# Patient Record
Sex: Female | Born: 1965
Health system: Southern US, Community
[De-identification: ages and names within clinical notes are randomized; demographics above are authoritative.]

## PROBLEM LIST (undated history)

## (undated) DIAGNOSIS — IMO0002 Reserved for concepts with insufficient information to code with codable children: Secondary | ICD-10-CM

## (undated) DIAGNOSIS — F4310 Post-traumatic stress disorder, unspecified: Secondary | ICD-10-CM

## (undated) DIAGNOSIS — N289 Disorder of kidney and ureter, unspecified: Secondary | ICD-10-CM

## (undated) DIAGNOSIS — I639 Cerebral infarction, unspecified: Secondary | ICD-10-CM

## (undated) DIAGNOSIS — J189 Pneumonia, unspecified organism: Secondary | ICD-10-CM

## (undated) DIAGNOSIS — F32A Depression, unspecified: Secondary | ICD-10-CM

## (undated) DIAGNOSIS — D649 Anemia, unspecified: Secondary | ICD-10-CM

## (undated) DIAGNOSIS — Z9289 Personal history of other medical treatment: Secondary | ICD-10-CM

## (undated) DIAGNOSIS — N183 Chronic kidney disease, stage 3 unspecified: Secondary | ICD-10-CM

## (undated) DIAGNOSIS — I10 Essential (primary) hypertension: Secondary | ICD-10-CM

## (undated) DIAGNOSIS — I2699 Other pulmonary embolism without acute cor pulmonale: Secondary | ICD-10-CM

## (undated) DIAGNOSIS — M069 Rheumatoid arthritis, unspecified: Secondary | ICD-10-CM

## (undated) DIAGNOSIS — F419 Anxiety disorder, unspecified: Secondary | ICD-10-CM

## (undated) DIAGNOSIS — R569 Unspecified convulsions: Secondary | ICD-10-CM

## (undated) DIAGNOSIS — G459 Transient cerebral ischemic attack, unspecified: Secondary | ICD-10-CM

## (undated) DIAGNOSIS — E785 Hyperlipidemia, unspecified: Secondary | ICD-10-CM

## (undated) DIAGNOSIS — M1990 Unspecified osteoarthritis, unspecified site: Secondary | ICD-10-CM

## (undated) DIAGNOSIS — M329 Systemic lupus erythematosus, unspecified: Secondary | ICD-10-CM

## (undated) DIAGNOSIS — F329 Major depressive disorder, single episode, unspecified: Secondary | ICD-10-CM

## (undated) HISTORY — PX: ABDOMINAL SURGERY: SHX537

## (undated) HISTORY — DX: Post-traumatic stress disorder, unspecified: F43.10

## (undated) HISTORY — DX: Major depressive disorder, single episode, unspecified: F32.9

## (undated) HISTORY — PX: OTHER SURGICAL HISTORY: SHX169

## (undated) HISTORY — DX: Anxiety disorder, unspecified: F41.9

## (undated) HISTORY — PX: CYST EXCISION: SHX5701

## (undated) HISTORY — DX: Depression, unspecified: F32.A

## (undated) HISTORY — PX: ABLATION: SHX5711

---

## 2015-12-07 ENCOUNTER — Encounter (HOSPITAL_COMMUNITY): Payer: Self-pay

## 2015-12-07 ENCOUNTER — Emergency Department (HOSPITAL_COMMUNITY): Payer: BLUE CROSS/BLUE SHIELD

## 2015-12-07 ENCOUNTER — Emergency Department (HOSPITAL_COMMUNITY)
Admission: EM | Admit: 2015-12-07 | Discharge: 2015-12-07 | Disposition: A | Discharge status: Home / Self Care | Payer: BLUE CROSS/BLUE SHIELD | Attending: Emergency Medicine | Admitting: Emergency Medicine

## 2015-12-07 DIAGNOSIS — M545 Low back pain: Secondary | ICD-10-CM | POA: Diagnosis present

## 2015-12-07 DIAGNOSIS — Z79899 Other long term (current) drug therapy: Secondary | ICD-10-CM | POA: Insufficient documentation

## 2015-12-07 DIAGNOSIS — Z86711 Personal history of pulmonary embolism: Secondary | ICD-10-CM | POA: Diagnosis not present

## 2015-12-07 DIAGNOSIS — R0602 Shortness of breath: Secondary | ICD-10-CM | POA: Insufficient documentation

## 2015-12-07 DIAGNOSIS — R011 Cardiac murmur, unspecified: Secondary | ICD-10-CM | POA: Insufficient documentation

## 2015-12-07 DIAGNOSIS — Z3202 Encounter for pregnancy test, result negative: Secondary | ICD-10-CM | POA: Insufficient documentation

## 2015-12-07 DIAGNOSIS — Z8673 Personal history of transient ischemic attack (TIA), and cerebral infarction without residual deficits: Secondary | ICD-10-CM | POA: Insufficient documentation

## 2015-12-07 HISTORY — DX: Other pulmonary embolism without acute cor pulmonale: I26.99

## 2015-12-07 HISTORY — DX: Systemic lupus erythematosus, unspecified: M32.9

## 2015-12-07 HISTORY — DX: Reserved for concepts with insufficient information to code with codable children: IMO0002

## 2015-12-07 HISTORY — DX: Transient cerebral ischemic attack, unspecified: G45.9

## 2015-12-07 LAB — URINALYSIS, ROUTINE W REFLEX MICROSCOPIC
BILIRUBIN URINE: NEGATIVE
Glucose, UA: NEGATIVE mg/dL
Hgb urine dipstick: NEGATIVE
KETONES UR: NEGATIVE mg/dL
LEUKOCYTES UA: NEGATIVE
NITRITE: NEGATIVE
PH: 7.5 (ref 5.0–8.0)
PROTEIN: NEGATIVE mg/dL
Specific Gravity, Urine: 1.012 (ref 1.005–1.030)

## 2015-12-07 LAB — COMPREHENSIVE METABOLIC PANEL
ALBUMIN: 3.9 g/dL (ref 3.5–5.0)
ALK PHOS: 85 U/L (ref 38–126)
ALT: 25 U/L (ref 14–54)
ANION GAP: 14 (ref 5–15)
AST: 30 U/L (ref 15–41)
BUN: 17 mg/dL (ref 6–20)
CALCIUM: 9.5 mg/dL (ref 8.9–10.3)
CHLORIDE: 97 mmol/L — AB (ref 101–111)
CO2: 26 mmol/L (ref 22–32)
Creatinine, Ser: 1.16 mg/dL — ABNORMAL HIGH (ref 0.44–1.00)
GFR calc non Af Amer: 55 mL/min — ABNORMAL LOW (ref >60)
GLUCOSE: 93 mg/dL (ref 65–99)
Potassium: 3.4 mmol/L — ABNORMAL LOW (ref 3.5–5.1)
SODIUM: 137 mmol/L (ref 135–145)
Total Bilirubin: 0.5 mg/dL (ref 0.3–1.2)
Total Protein: 7.6 g/dL (ref 6.5–8.1)

## 2015-12-07 LAB — CBC WITH DIFFERENTIAL/PLATELET
BASOS PCT: 1 %
Basophils Absolute: 0.1 10*3/uL (ref 0.0–0.1)
EOS ABS: 0.2 10*3/uL (ref 0.0–0.7)
EOS PCT: 2 %
HCT: 38.6 % (ref 36.0–46.0)
HEMOGLOBIN: 12.4 g/dL (ref 12.0–15.0)
Lymphocytes Relative: 22 %
Lymphs Abs: 2.3 10*3/uL (ref 0.7–4.0)
MCH: 24.9 pg — AB (ref 26.0–34.0)
MCHC: 32.1 g/dL (ref 30.0–36.0)
MCV: 77.7 fL — ABNORMAL LOW (ref 78.0–100.0)
MONOS PCT: 8 %
Monocytes Absolute: 0.8 10*3/uL (ref 0.1–1.0)
NEUTROS PCT: 67 %
Neutro Abs: 7 10*3/uL (ref 1.7–7.7)
PLATELETS: 428 10*3/uL — AB (ref 150–400)
RBC: 4.97 MIL/uL (ref 3.87–5.11)
RDW: 15.5 % (ref 11.5–15.5)
WBC: 10.5 10*3/uL (ref 4.0–10.5)

## 2015-12-07 LAB — I-STAT BETA HCG BLOOD, ED (MC, WL, AP ONLY)

## 2015-12-07 LAB — I-STAT TROPONIN, ED: Troponin i, poc: 0 ng/mL (ref 0.00–0.08)

## 2015-12-07 MED ORDER — TECHNETIUM TC 99M DIETHYLENETRIAME-PENTAACETIC ACID: 30.0000 | Freq: Once | INTRAVENOUS | Status: DC | PRN

## 2015-12-07 MED ORDER — SODIUM CHLORIDE 0.9 % IV BOLUS (SEPSIS)
1000.0000 mL | Freq: Once | INTRAVENOUS | Status: AC
Start: 2015-12-07 — End: 2015-12-07
  Administered 2015-12-07: 1000 mL via INTRAVENOUS

## 2015-12-07 MED ORDER — TECHNETIUM TO 99M ALBUMIN AGGREGATED
4.0000 | Freq: Once | INTRAVENOUS | Status: AC | PRN
  Administered 2015-12-07: 4 via INTRAVENOUS

## 2015-12-07 NOTE — ED Notes (Signed)
Family at bedside. 

## 2015-12-07 NOTE — Discharge Instructions (Signed)
Talk to your doctor about blood thinners. The guidelines are that you should be on chronic blood thinners.   See your doctor.   Return to ER if you have worse chest pain, shortness of breath, flank pain.

## 2015-12-07 NOTE — ED Provider Notes (Addendum)
CSN: [id number]     Arrival date & time 12/07/15  0920 History   First MD Initiated Contact with Patient 12/07/15 [phone]     Chief Complaint  Patient presents with  . Back Pain     (Consider location/radiation/quality/duration/timing/severity/associated sxs/prior Treatment) The history is provided by the patient.  Lauren Chung is a 50 y.o. female hx of TIA, recurrent PE (12 previously, took herself off coumadin 5 months ago), recently travel from [place] here presenting with shortness of breath, left-sided chest pain. Patient states that for the last several months, she has intermittent left sided rib pain and some associated shortness of breath as well. She just drove down from [place] about 4 months ago and has been back and forth from [place]. Denies any leg swelling. She primary care doctor at [facility] 2 days ago and told her about her symptoms and a d-dimer was ordered and was positive this morning. She is sent here for possible PE. She states that she had bad reactions to IV contrast and can't even get pre medicated for it.    Past Medical History  Diagnosis Date  . PE (pulmonary embolism)   . Lupus ([place])   . TIA (transient ischemic attack)    Past Surgical History  Procedure Laterality Date  . Abdominal surgery    . Ablation     History reviewed. No pertinent family history. Social History  Substance Use Topics  . Smoking status: Never Smoker   . Smokeless tobacco: None  . Alcohol Use: None   OB History    No data available     Review of Systems  Respiratory: Positive for shortness of breath.   Musculoskeletal: Positive for back pain.  All other systems reviewed and are negative.     Allergies  Codeine; Contrast media; and Tylenol  Home Medications   Prior to Admission medications   Medication Sig Start Date End Date Taking? Authorizing Provider  alprazolam Duanne Moron) 2 MG tablet Take 2 mg by mouth daily as needed. Anxiety 10/18/15  Yes Historical Provider, MD   ibuprofen (ADVIL,MOTRIN) 800 MG tablet Take 800 mg by mouth daily as needed. pain 11/03/15  Yes Historical Provider, MD  SUMAtriptan (IMITREX) 50 MG tablet Take 50 mg by mouth every 2 (two) hours as needed for migraine. May repeat in 2 hours if headache persists or recurs.   Yes Historical Provider, MD  triamterene-hydrochlorothiazide (MAXZIDE-25) 37.5-25 MG tablet Take 1 tablet by mouth daily. 11/22/15  Yes Historical Provider, MD  zolpidem (AMBIEN CR) 12.5 MG CR tablet Take 12.5 mg by mouth at bedtime as needed. Sleep 10/18/15  Yes Historical Provider, MD   BP 112/88 mmHg  Pulse 88  Temp(Src) 98.5 F (36.9 C) (Oral)  Resp 19  Ht 5\' 8"  (1.727 m)  Wt 175 lb (79.379 kg)  BMI 26.61 kg/m2  SpO2 100%  LMP 11/08/2015 (Exact Date) Physical Exam  Constitutional: She is oriented to person, place, and time. She appears well-developed and well-nourished.  HENT:  Head: Normocephalic.  Mouth/Throat: Oropharynx is clear and moist.  Eyes: Conjunctivae are normal. Pupils are equal, round, and reactive to light.  Neck: Normal range of motion. Neck supple.  Cardiovascular: Regular rhythm and normal heart sounds.   Slightly tachycardic   Pulmonary/Chest: Effort normal and breath sounds normal. No respiratory distress. She has no wheezes. She has no rales.  Mild tenderness L lower rib, no obvious bruising   Abdominal: Soft. Bowel sounds are normal. She exhibits no distension. There  is no tenderness. There is no rebound.  Musculoskeletal: Normal range of motion. She exhibits no edema or tenderness.  No calf tenderness   Neurological: She is alert and oriented to person, place, and time.  Skin: Skin is warm and dry.  Psychiatric: She has a normal mood and affect. Her behavior is normal. Judgment and thought content normal.  Nursing note and vitals reviewed.   ED Course  Procedures (including critical care time) Labs Review Labs Reviewed  CBC WITH DIFFERENTIAL/PLATELET - Abnormal; Notable for the  following:    MCV 77.7 (*)    MCH 24.9 (*)    Platelets 428 (*)    All other components within normal limits  COMPREHENSIVE METABOLIC PANEL - Abnormal; Notable for the following:    Potassium 3.4 (*)    Chloride 97 (*)    Creatinine, Ser 1.16 (*)    GFR calc non Af Amer 55 (*)    All other components within normal limits  URINALYSIS, ROUTINE W REFLEX MICROSCOPIC (NOT AT [facility]) - Abnormal; Notable for the following:    APPearance HAZY (*)    All other components within normal limits  I-STAT TROPOININ, ED  I-STAT BETA HCG BLOOD, ED ([facility], [facility], [facility] ONLY)    Imaging Review Dg Chest 2 View  12/07/2015  CLINICAL DATA:  2-3 month history of left side back pain and shortness of breath. Initial encounter. EXAM: CHEST  2 VIEW COMPARISON:  None. FINDINGS: The lungs are clear. Heart size is normal. No pneumothorax or pleural effusion. No focal bony abnormality. IMPRESSION: No acute disease. Electronically Signed   By: Inge Rise M.D.   On: 12/07/2015 10:29   Nm Pulmonary Perf And Vent  12/07/2015  CLINICAL DATA:  Shortness of breath. EXAM: NUCLEAR MEDICINE VENTILATION - PERFUSION LUNG SCAN TECHNIQUE: Ventilation images were obtained in multiple projections using inhaled aerosol Tc-10m DTPA. Perfusion images were obtained in multiple projections after intravenous injection of Tc-45m MAA. RADIOPHARMACEUTICALS:  31.0 Technetium-11m DTPA aerosol inhalation and 4.2 Technetium-88m MAA IV COMPARISON:  Chest x-ray 12/07/2015. FINDINGS: Ventilation: No focal ventilation defect. Perfusion: No wedge shaped peripheral perfusion defects to suggest acute pulmonary embolism. IMPRESSION: Negative exam.  No evidence of pulmonary embolus. Electronically Signed   By: Marcello Moores  Register   On: 12/07/2015 13:49   I have personally reviewed and evaluated these images and lab results as part of my medical decision-making.   EKG Interpretation   Date/Time:  Friday December 07 2015 09:46:06 EST Ventricular Rate:  110 PR  Interval:  137 QRS Duration: 89 QT Interval:  348 QTC Calculation: 471 R Axis:   78 Text Interpretation:  Sinus tachycardia Probable left atrial enlargement  Low voltage, precordial leads Baseline wander in lead(s) V4 No previous  ECGs available Confirmed by Nashley Cordoba  MD, Peniel Hass ([id number]) on 12/07/2015 9:57:19 AM      MDM   Final diagnoses:  Shortness of breath    Lauren Chung is a 50 y.o. female here with shortness of breath, possible PE. Has anaphylaxis to IV contrast. Will get labs, CXR, VQ scan.    2:13 PM CXR nl. Labs unremarkable. UA showed no blood. VQ scan neg. Tachycardia resolved and vitals are stable. Didn't require any pain meds. I told her that since she has multiple PEs in the past, even though she has no PE currently, she should be on life long anticoagulation. Patient stopped coumadin 5 months ago by herself. I counseled her regarding xarelto and eliquis and other newer agents but she is  not interested currently. Told her to discuss this with her doctor.    Wandra Arthurs, MD 12/07/15 [place] Rhylynn Perdomo, MD 12/07/15 [phone]

## 2015-12-07 NOTE — ED Notes (Signed)
Patient transported to NM 

## 2015-12-07 NOTE — ED Notes (Signed)
Pt ambulated to the bathroom with ease 

## 2015-12-07 NOTE — ED Notes (Signed)
Patient transported to X-ray 

## 2015-12-07 NOTE — ED Notes (Addendum)
Pt presents with 2-3 month h/o L sided back pain and shortness of breath.  Pt reports having multiple PEs in past, has been off coumadin x 5 months. Pt seen at PCP, reports D-dimer was elevated.

## 2015-12-07 NOTE — ED Notes (Signed)
Pt in NM 

## 2015-12-24 ENCOUNTER — Other Ambulatory Visit: Payer: Self-pay | Admitting: Family

## 2015-12-24 DIAGNOSIS — Z1231 Encounter for screening mammogram for malignant neoplasm of breast: Secondary | ICD-10-CM

## 2016-07-18 ENCOUNTER — Encounter: Payer: Self-pay | Admitting: Hematology

## 2016-07-18 ENCOUNTER — Telehealth: Payer: Self-pay | Admitting: Hematology

## 2016-07-18 NOTE — Telephone Encounter (Signed)
Pt called in for appt., The referral was opened and an appt was scheduled, referring provider was contacted (lt mess) to forward recent records and made aware of appt date/time.  Mailed pt letter

## 2016-07-20 DIAGNOSIS — R402411 Glasgow coma scale score 13-15, in the field [EMT or ambulance]: Secondary | ICD-10-CM | POA: Diagnosis not present

## 2016-07-21 ENCOUNTER — Emergency Department (HOSPITAL_COMMUNITY)
Admission: EM | Admit: 2016-07-21 | Discharge: 2016-07-21 | Disposition: A | Discharge status: Home / Self Care | Payer: BLUE CROSS/BLUE SHIELD | Attending: Emergency Medicine | Admitting: Emergency Medicine

## 2016-07-21 ENCOUNTER — Encounter (HOSPITAL_COMMUNITY): Payer: Self-pay | Admitting: Emergency Medicine

## 2016-07-21 ENCOUNTER — Emergency Department (HOSPITAL_COMMUNITY): Payer: BLUE CROSS/BLUE SHIELD

## 2016-07-21 DIAGNOSIS — R410 Disorientation, unspecified: Secondary | ICD-10-CM | POA: Diagnosis not present

## 2016-07-21 DIAGNOSIS — Z79899 Other long term (current) drug therapy: Secondary | ICD-10-CM | POA: Insufficient documentation

## 2016-07-21 DIAGNOSIS — R42 Dizziness and giddiness: Secondary | ICD-10-CM | POA: Diagnosis not present

## 2016-07-21 DIAGNOSIS — R4182 Altered mental status, unspecified: Secondary | ICD-10-CM | POA: Diagnosis present

## 2016-07-21 DIAGNOSIS — Z8673 Personal history of transient ischemic attack (TIA), and cerebral infarction without residual deficits: Secondary | ICD-10-CM | POA: Diagnosis not present

## 2016-07-21 HISTORY — DX: Cerebral infarction, unspecified: I63.9

## 2016-07-21 LAB — COMPREHENSIVE METABOLIC PANEL
ALT: 16 U/L (ref 14–54)
AST: 23 U/L (ref 15–41)
Albumin: 4 g/dL (ref 3.5–5.0)
Alkaline Phosphatase: 63 U/L (ref 38–126)
Anion gap: 7 (ref 5–15)
BUN: 7 mg/dL (ref 6–20)
CHLORIDE: 111 mmol/L (ref 101–111)
CO2: 20 mmol/L — AB (ref 22–32)
CREATININE: 1.03 mg/dL — AB (ref 0.44–1.00)
Calcium: 9.5 mg/dL (ref 8.9–10.3)
GFR calc non Af Amer: >60 mL/min (ref >60)
GLUCOSE: 79 mg/dL (ref 65–99)
Potassium: 3.2 mmol/L — ABNORMAL LOW (ref 3.5–5.1)
Sodium: 138 mmol/L (ref 135–145)
Total Bilirubin: 0.2 mg/dL — ABNORMAL LOW (ref 0.3–1.2)
Total Protein: 7.4 g/dL (ref 6.5–8.1)

## 2016-07-21 LAB — CBC WITH DIFFERENTIAL/PLATELET
BASOS PCT: 0 %
Basophils Absolute: 0 10*3/uL (ref 0.0–0.1)
EOS PCT: 3 %
Eosinophils Absolute: 0.3 10*3/uL (ref 0.0–0.7)
HCT: 38.4 % (ref 36.0–46.0)
HEMOGLOBIN: 12.1 g/dL (ref 12.0–15.0)
LYMPHS PCT: 20 %
Lymphs Abs: 2.3 10*3/uL (ref 0.7–4.0)
MCH: 23.9 pg — AB (ref 26.0–34.0)
MCHC: 31.5 g/dL (ref 30.0–36.0)
MCV: 75.9 fL — AB (ref 78.0–100.0)
MONO ABS: 0.7 10*3/uL (ref 0.1–1.0)
Monocytes Relative: 6 %
NEUTROS PCT: 71 %
Neutro Abs: 8 10*3/uL — ABNORMAL HIGH (ref 1.7–7.7)
Platelets: 325 10*3/uL (ref 150–400)
RBC: 5.06 MIL/uL (ref 3.87–5.11)
RDW: 18.8 % — ABNORMAL HIGH (ref 11.5–15.5)
WBC: 11.3 10*3/uL — ABNORMAL HIGH (ref 4.0–10.5)

## 2016-07-21 LAB — URINALYSIS, ROUTINE W REFLEX MICROSCOPIC
BILIRUBIN URINE: NEGATIVE
Glucose, UA: NEGATIVE mg/dL
Hgb urine dipstick: NEGATIVE
KETONES UR: NEGATIVE mg/dL
Leukocytes, UA: NEGATIVE
NITRITE: NEGATIVE
Protein, ur: NEGATIVE mg/dL
Specific Gravity, Urine: 1.005 (ref 1.005–1.030)
pH: 8.5 — ABNORMAL HIGH (ref 5.0–8.0)

## 2016-07-21 LAB — I-STAT CG4 LACTIC ACID, ED
LACTIC ACID, VENOUS: 1.4 mmol/L (ref 0.5–1.9)
LACTIC ACID, VENOUS: 0.91 mmol/L (ref 0.5–1.9)

## 2016-07-21 LAB — RAPID URINE DRUG SCREEN, HOSP PERFORMED
AMPHETAMINES: NOT DETECTED
Barbiturates: NOT DETECTED
Benzodiazepines: POSITIVE — AB
Cocaine: NOT DETECTED
Opiates: NOT DETECTED
TETRAHYDROCANNABINOL: NOT DETECTED

## 2016-07-21 MED ORDER — SODIUM CHLORIDE 0.9 % IV BOLUS (SEPSIS)
1000.0000 mL | Freq: Once | INTRAVENOUS | Status: AC
  Administered 2016-07-21: 1000 mL via INTRAVENOUS

## 2016-07-21 MED ORDER — MECLIZINE HCL 25 MG PO TABS
25.0000 mg | ORAL_TABLET | Freq: Once | ORAL | Status: AC
  Administered 2016-07-21: 25 mg via ORAL
  Filled 2016-07-21: qty 1

## 2016-07-21 MED ORDER — METOCLOPRAMIDE HCL 5 MG/ML IJ SOLN
10.0000 mg | INTRAMUSCULAR | Status: AC
  Administered 2016-07-21: 10 mg via INTRAVENOUS
  Filled 2016-07-21: qty 2

## 2016-07-21 MED ORDER — DIPHENHYDRAMINE HCL 50 MG/ML IJ SOLN
25.0000 mg | Freq: Once | INTRAMUSCULAR | Status: AC
  Administered 2016-07-21: 25 mg via INTRAVENOUS
  Filled 2016-07-21: qty 1

## 2016-07-21 MED ORDER — LORAZEPAM 2 MG/ML IJ SOLN
1.0000 mg | Freq: Once | INTRAMUSCULAR | Status: AC | PRN
  Administered 2016-07-21: 1 mg via INTRAVENOUS
  Filled 2016-07-21: qty 1

## 2016-07-21 MED ORDER — MECLIZINE HCL 25 MG PO TABS: 25.0000 mg | ORAL_TABLET | Freq: Three times a day (TID) | ORAL | 0 refills | Status: DC | PRN

## 2016-07-21 MED ORDER — DIAZEPAM 5 MG/ML IJ SOLN
5.0000 mg | Freq: Once | INTRAMUSCULAR | Status: AC
  Administered 2016-07-21: 5 mg via INTRAVENOUS
  Filled 2016-07-21: qty 2

## 2016-07-21 MED ORDER — ONDANSETRON HCL 4 MG PO TABS: 4.0000 mg | ORAL_TABLET | Freq: Four times a day (QID) | ORAL | 0 refills | Status: DC

## 2016-07-21 MED ORDER — ONDANSETRON HCL 4 MG/2ML IJ SOLN
4.0000 mg | Freq: Once | INTRAMUSCULAR | Status: AC
  Administered 2016-07-21: 4 mg via INTRAVENOUS
  Filled 2016-07-21: qty 2

## 2016-07-21 NOTE — Discharge Instructions (Signed)
See your doctor for recheck in 1-2 days. Return to the emergency department with any worsening symptoms or new concerns.

## 2016-07-21 NOTE — ED Provider Notes (Signed)
Assumed care from PA Upstill at shift change.  See her note for full H&P.  Briefly, 50 y.o. F arriving here around midnight with confusion and feeling unwell for the past several days.  Per previous provider there was some confusion regarding the year, otherwise has been fully oriented here.  Does report memory deficits since prior CVA last year.  Labs and CT head thus far reassuring.  Plan:  IVF and meclizine for suspected vertigo.  If feeling better, may discharge home.  Paperwork already printed her PA [place].   Results for orders placed or performed during the hospital encounter of 07/21/16  Comprehensive metabolic panel  Result Value Ref Range   Sodium 138 135 - 145 mmol/L   Potassium 3.2 (L) 3.5 - 5.1 mmol/L   Chloride 111 101 - 111 mmol/L   CO2 20 (L) 22 - 32 mmol/L   Glucose, Bld 79 65 - 99 mg/dL   BUN 7 6 - 20 mg/dL   Creatinine, Ser 1.03 (H) 0.44 - 1.00 mg/dL   Calcium 9.5 8.9 - 10.3 mg/dL   Total Protein 7.4 6.5 - 8.1 g/dL   Albumin 4.0 3.5 - 5.0 g/dL   AST 23 15 - 41 U/L   ALT 16 14 - 54 U/L   Alkaline Phosphatase 63 38 - 126 U/L   Total Bilirubin 0.2 (L) 0.3 - 1.2 mg/dL   GFR calc non Af Amer >60 >60 mL/min   GFR calc Af Amer >60 >60 mL/min   Anion gap 7 5 - 15  CBC with Differential  Result Value Ref Range   WBC 11.3 (H) 4.0 - 10.5 K/uL   RBC 5.06 3.87 - 5.11 MIL/uL   Hemoglobin 12.1 12.0 - 15.0 g/dL   HCT 38.4 36.0 - 46.0 %   MCV 75.9 (L) 78.0 - 100.0 fL   MCH 23.9 (L) 26.0 - 34.0 pg   MCHC 31.5 30.0 - 36.0 g/dL   RDW 18.8 (H) 11.5 - 15.5 %   Platelets 325 150 - 400 K/uL   Neutrophils Relative % 71 %   Lymphocytes Relative 20 %   Monocytes Relative 6 %   Eosinophils Relative 3 %   Basophils Relative 0 %   Neutro Abs 8.0 (H) 1.7 - 7.7 K/uL   Lymphs Abs 2.3 0.7 - 4.0 K/uL   Monocytes Absolute 0.7 0.1 - 1.0 K/uL   Eosinophils Absolute 0.3 0.0 - 0.7 K/uL   Basophils Absolute 0.0 0.0 - 0.1 K/uL   WBC Morphology ATYPICAL LYMPHOCYTES   Urine rapid drug screen  (hosp performed)  Result Value Ref Range   Opiates NONE DETECTED NONE DETECTED   Cocaine NONE DETECTED NONE DETECTED   Benzodiazepines POSITIVE (A) NONE DETECTED   Amphetamines NONE DETECTED NONE DETECTED   Tetrahydrocannabinol NONE DETECTED NONE DETECTED   Barbiturates NONE DETECTED NONE DETECTED  Urinalysis, Routine w reflex microscopic  Result Value Ref Range   Color, Urine YELLOW YELLOW   APPearance CLEAR CLEAR   Specific Gravity, Urine 1.005 1.005 - 1.030   pH 8.5 (H) 5.0 - 8.0   Glucose, UA NEGATIVE NEGATIVE mg/dL   Hgb urine dipstick NEGATIVE NEGATIVE   Bilirubin Urine NEGATIVE NEGATIVE   Ketones, ur NEGATIVE NEGATIVE mg/dL   Protein, ur NEGATIVE NEGATIVE mg/dL   Nitrite NEGATIVE NEGATIVE   Leukocytes, UA NEGATIVE NEGATIVE  I-Stat CG4 Lactic Acid, ED  Result Value Ref Range   Lactic Acid, Venous 1.40 0.5 - 1.9 mmol/L  I-Stat CG4 Lactic Acid, ED  Result  Value Ref Range   Lactic Acid, Venous 0.91 0.5 - 1.9 mmol/L   Ct Head Wo Contrast  Result Date: 07/21/2016 CLINICAL DATA:  Dizziness, nausea, and vomiting this evening. EXAM: CT HEAD WITHOUT CONTRAST TECHNIQUE: Contiguous axial images were obtained from the base of the skull through the vertex without intravenous contrast. COMPARISON:  None. FINDINGS: Brain: No evidence of acute infarction, hemorrhage, hydrocephalus, extra-axial collection or mass lesion/mass effect. Vascular: No hyperdense vessel or unexpected calcification. Skull: Normal. Negative for fracture or focal lesion. Sinuses/Orbits: No acute finding. Other: Subcutaneous soft tissue scalp nodules with calcification likely representing trichilemmal cysts. IMPRESSION: No acute intracranial abnormalities. Electronically Signed   By: Lucienne Capers M.D.   On: 07/21/2016 03:12    7:10 AM Went to reassess patient-- has not yet been given meds that were ordered at 5:30am.  Have asked nurse to give them as well as oral fluids.  10:27 AM After medications, patient states  she does not feel her dizziness has necessarily improved. I assisted her onto the side of the bed and then to stand which probably induced a "room spinning sensation" and nausea with vomiting. Patient states these are the same symptoms she was experiencing earlier this morning.  Will give additional IVF, valium, and zofran as meclizine did not seem to work for her.  12:27 PM Patient feeling much better after valium and zofran while in bed.  She is sitting upright, watching TV.  Would like to go home.  2:00 PM  Attempted to ambulate here, very unsteady on her feet requiring heavy assist with NT and her IV pole.  Patient states this is not normal for her as she generally walks unassisted at home.  She does have hx of strokes in the past.  Will obtain MRI to r/o cerebellar stroke given lack of improvement with intervention here.  Patient does report she is very claustrophobic and requesting larger MRI machine.  Have called MRI and requested this as well as ordered some ativan for pre-medication.    3:31 PM Care signed out to [place] with MRI pending.  If negative, may d/c home with supportive care and outpatient neurology.  If acute findings, will need admission.   Larene Pickett, PA-C 07/21/16 [place], MD 07/22/16 [phone]

## 2016-07-21 NOTE — ED Notes (Signed)
Pt c/o being dizzy

## 2016-07-21 NOTE — ED Triage Notes (Signed)
Patient was found in her car at home at 2315.  Patient is altered, only knows her name and where she is.  She is having confusion with repetitive questioning.  Patient is usually CAOx4 and able to gets around at home.  She has had a past stroke, with no deficit per EMS and family.  Daughter stated that patient didn't want to be here anymore when she spoke with patient earlier in day.  LSN at 1600 on 07/20/2016.

## 2016-07-21 NOTE — ED Provider Notes (Signed)
[facility] DEPT Provider Note   CSN: [id number] Arrival date & time: 07/21/16  0021     History   Chief Complaint Chief Complaint  Patient presents with  . Altered Mental Status    HPI Lauren Chung is a 50 y.o. female.  Patient with a history of CVA, lupus, PE presents with confusion today after not feeling well for the past couple of days, per EMS/family. Family is not at bedside to contribute to history. The patient reports she is having difficulty urinating, feeling as if she has to but can't produce any urine. She reports fever and that she has been sick a very long time. She believes it is 2007 but is cognizant of person and place. She denies SI/HI, feels she was hallucinating last night with high fever but not hallucinating now. She denies drug or alcohol use.    The history is provided by the patient and the EMS personnel. No language interpreter was used.  Altered Mental Status   This is a new problem. The current episode started 6 to 12 hours ago. Associated symptoms include confusion and weakness.    Past Medical History:  Diagnosis Date  . Lupus ([place])   . PE (pulmonary embolism)   . Stroke ([place])   . TIA (transient ischemic attack)     There are no active problems to display for this patient.   Past Surgical History:  Procedure Laterality Date  . ABDOMINAL SURGERY    . ABLATION      OB History    No data available       Home Medications    Prior to Admission medications   Medication Sig Start Date End Date Taking? Authorizing Provider  alprazolam Duanne Moron) 2 MG tablet Take 2 mg by mouth daily as needed. Anxiety 10/18/15   Historical Provider, MD  ibuprofen (ADVIL,MOTRIN) 800 MG tablet Take 800 mg by mouth daily as needed. pain 11/03/15   Historical Provider, MD  SUMAtriptan (IMITREX) 50 MG tablet Take 50 mg by mouth every 2 (two) hours as needed for migraine. May repeat in 2 hours if headache persists or recurs.    Historical Provider, MD    triamterene-hydrochlorothiazide (MAXZIDE-25) 37.5-25 MG tablet Take 1 tablet by mouth daily. 11/22/15   Historical Provider, MD  zolpidem (AMBIEN CR) 12.5 MG CR tablet Take 12.5 mg by mouth at bedtime as needed. Sleep 10/18/15   Historical Provider, MD    Family History No family history on file.  Social History Social History  Substance Use Topics  . Smoking status: Never Smoker  . Smokeless tobacco: Never Used  . Alcohol use Not on file     Allergies   Codeine; Contrast media [iodinated diagnostic agents]; and Tylenol [acetaminophen]   Review of Systems Review of Systems  Constitutional: Positive for appetite change and fever. Negative for chills.  HENT: Negative.   Respiratory: Positive for cough. Negative for shortness of breath.   Cardiovascular: Negative.   Gastrointestinal: Negative.  Negative for vomiting.  Genitourinary: Positive for decreased urine volume and dysuria. Negative for flank pain.  Musculoskeletal: Negative.  Negative for myalgias.  Skin: Negative.  Negative for color change and rash.  Neurological: Positive for weakness.  Psychiatric/Behavioral: Positive for confusion.     Physical Exam Updated Vital Signs BP 134/68 (BP Location: Left Arm)   Pulse 109   Temp 97.7 F (36.5 C) (Oral)   Resp 24   SpO2 100%   Physical Exam  Constitutional: She appears well-developed and well-nourished.  HENT:  Head: Normocephalic and atraumatic.  Eyes: Conjunctivae and EOM are normal. Pupils are equal, round, and reactive to light.  Neck: Normal range of motion. Neck supple.  Cardiovascular: Regular rhythm.  Tachycardia present.   Pulmonary/Chest: Effort normal and breath sounds normal.  Abdominal: Soft. Bowel sounds are normal. There is tenderness (Diffuse abdominal tenderness that is mild). There is no rebound and no guarding.  Musculoskeletal: Normal range of motion.  Neurological: She is alert. GCS eye subscore is 4. GCS verbal subscore is 5. GCS motor  subscore is 6.  Oriented to person and place and is confused. Moves all extremities with equal strength. CN's 3-12 grossly intact.   Skin: Skin is warm and dry. No rash noted.     ED Treatments / Results  Labs (all labs ordered are listed, but only abnormal results are displayed) Labs Reviewed  URINE CULTURE  LACTIC ACID, PLASMA  LACTIC ACID, PLASMA  COMPREHENSIVE METABOLIC PANEL  CBC WITH DIFFERENTIAL/PLATELET  URINE RAPID DRUG SCREEN, HOSP PERFORMED  ETHANOL  URINALYSIS, ROUTINE W REFLEX MICROSCOPIC (NOT AT [facility])   Results for orders placed or performed during the hospital encounter of 07/21/16  Comprehensive metabolic panel  Result Value Ref Range   Sodium 138 135 - 145 mmol/L   Potassium 3.2 (L) 3.5 - 5.1 mmol/L   Chloride 111 101 - 111 mmol/L   CO2 20 (L) 22 - 32 mmol/L   Glucose, Bld 79 65 - 99 mg/dL   BUN 7 6 - 20 mg/dL   Creatinine, Ser 1.03 (H) 0.44 - 1.00 mg/dL   Calcium 9.5 8.9 - 10.3 mg/dL   Total Protein 7.4 6.5 - 8.1 g/dL   Albumin 4.0 3.5 - 5.0 g/dL   AST 23 15 - 41 U/L   ALT 16 14 - 54 U/L   Alkaline Phosphatase 63 38 - 126 U/L   Total Bilirubin 0.2 (L) 0.3 - 1.2 mg/dL   GFR calc non Af Amer >60 >60 mL/min   GFR calc Af Amer >60 >60 mL/min   Anion gap 7 5 - 15  CBC with Differential  Result Value Ref Range   WBC 11.3 (H) 4.0 - 10.5 K/uL   RBC 5.06 3.87 - 5.11 MIL/uL   Hemoglobin 12.1 12.0 - 15.0 g/dL   HCT 38.4 36.0 - 46.0 %   MCV 75.9 (L) 78.0 - 100.0 fL   MCH 23.9 (L) 26.0 - 34.0 pg   MCHC 31.5 30.0 - 36.0 g/dL   RDW 18.8 (H) 11.5 - 15.5 %   Platelets 325 150 - 400 K/uL   Neutrophils Relative % 71 %   Lymphocytes Relative 20 %   Monocytes Relative 6 %   Eosinophils Relative 3 %   Basophils Relative 0 %   Neutro Abs 8.0 (H) 1.7 - 7.7 K/uL   Lymphs Abs 2.3 0.7 - 4.0 K/uL   Monocytes Absolute 0.7 0.1 - 1.0 K/uL   Eosinophils Absolute 0.3 0.0 - 0.7 K/uL   Basophils Absolute 0.0 0.0 - 0.1 K/uL   WBC Morphology ATYPICAL LYMPHOCYTES   Urine  rapid drug screen (hosp performed)  Result Value Ref Range   Opiates NONE DETECTED NONE DETECTED   Cocaine NONE DETECTED NONE DETECTED   Benzodiazepines POSITIVE (A) NONE DETECTED   Amphetamines NONE DETECTED NONE DETECTED   Tetrahydrocannabinol NONE DETECTED NONE DETECTED   Barbiturates NONE DETECTED NONE DETECTED  Urinalysis, Routine w reflex microscopic  Result Value Ref Range   Color, Urine YELLOW YELLOW   APPearance  CLEAR CLEAR   Specific Gravity, Urine 1.005 1.005 - 1.030   pH 8.5 (H) 5.0 - 8.0   Glucose, UA NEGATIVE NEGATIVE mg/dL   Hgb urine dipstick NEGATIVE NEGATIVE   Bilirubin Urine NEGATIVE NEGATIVE   Ketones, ur NEGATIVE NEGATIVE mg/dL   Protein, ur NEGATIVE NEGATIVE mg/dL   Nitrite NEGATIVE NEGATIVE   Leukocytes, UA NEGATIVE NEGATIVE  I-Stat CG4 Lactic Acid, ED  Result Value Ref Range   Lactic Acid, Venous 1.40 0.5 - 1.9 mmol/L    EKG  EKG Interpretation None       Radiology No results found.  Procedures Procedures (including critical care time)  Medications Ordered in ED Medications  sodium chloride 0.9 % bolus 1,000 mL (not administered)     Initial Impression / Assessment and Plan / ED Course  I have reviewed the triage vital signs and the nursing notes.  Pertinent labs & imaging results that were available during my care of the patient were reviewed by me and considered in my medical decision making (see chart for details).  Clinical Course    Patient presents with complaint of confusion, found sitting in her car. She states she was in her car getting ready to drive herself to the emergency department for dizziness. She continues to be dizzy here and describes room-spinning quality. History of vertigo. Has taken Meclizine in the past.   Patient is observed over time. CT head negative. She is given IVF's without change to dizziness. Meclizine ordered.   She appears improved mentally. Reports she has had memory deficits since previous CVA. She  has a nonfocal neurologic exam. VSS. No evidence sepsis or infection. She wants to go home.   Will reassess after Meclizine for improvement and, if better, will attempt to call family for discharge home. Patient care left to oncoming provider.   Final Clinical Impressions(s) / ED Diagnoses   Final diagnoses:  None  1. Dizziness 2. Confusion, question baseline status  New Prescriptions New Prescriptions   No medications on file     Charlann Lange, PA-C 07/21/16 [place], MD 07/21/16 [phone]

## 2016-07-21 NOTE — ED Provider Notes (Signed)
Patient signed out to me by Baird Cancer, PA-C.   Patient was seen last night for dizziness.  She has had prior strokes.  Initial workup negative.  Dizziness persists.  MRI pending.  MRI shows no evidence of stroke.  7:00 PM Patient ambulates, still feels wobbly, but states that her symptoms are much improved from prior attempts. She no longer has vomiting. She has appropriate follow-up. Her symptoms sound consistent with vertigo. She had a recent URI, and then symptoms began shortly thereafter. I'll discharge to home.   Montine Circle, PA-C 07/21/16 1901    Leo Grosser, MD 07/22/16 [phone]

## 2016-07-21 NOTE — ED Notes (Addendum)
Pt found with head in hands and complaining of severe headache radiating to neck and jaw with some numbness.  Alerting nurse

## 2016-07-21 NOTE — ED Notes (Addendum)
Ambulated pt approx 15 feet in hallway. Pt still having difficulty balancing herself, however, required less assistance than previous attempts while ambulating. Pt did not complain of any headaches this time; just feeling tired.

## 2016-07-21 NOTE — ED Notes (Signed)
Patient transported to MRI 

## 2016-07-21 NOTE — ED Provider Notes (Signed)
Patient presented to the ER with confusion. Patient reports that she has not been feeling well for a couple of days. She has been experiencing fever, low back pain and difficulty urinating. Tonight she started to feel "out of it". He is brought to the emergency department by ambulance. At arrival she is confused and disoriented.  Face to face Exam: HEENT - PERRLA Lungs - CTAB Heart - RRR, no M/R/G Abd - S/NT/ND Neuro - alert, disoriented to time  Plan: CT, labwork, monitor.   Orpah Greek, MD 07/21/16 [phone]

## 2016-07-21 NOTE — ED Notes (Signed)
Assisted pt to bedside commode with the assistance of Logan, EMT-Tech.  Pt appeared to continue to be dizzy upon standing and required the help of both of [place] to maintain stability while standing and walking between the bed and the toilet.  She endorses a severe headache that has gotten worse since we ambulated her earlier.

## 2016-07-21 NOTE — ED Notes (Signed)
The pt arrived  With  Hyperventilating  She has carpo-pedal spasms and c/o facial numbness she reports that she has not been feeling well for 2-3 days

## 2016-07-21 NOTE — ED Notes (Signed)
Pt taken to restroom with Shanon Brow NT, via wheelchair

## 2016-07-21 NOTE — ED Notes (Signed)
Rob PA at bedside 

## 2016-07-21 NOTE — ED Notes (Signed)
The pt is no longer hyperventilating she returns from c-t  Makes eye contact calm entirely different than when she was when she arrived.  Answering questions she reports that her family is at home asleep

## 2016-07-21 NOTE — ED Notes (Addendum)
Pt ambulated approx. 15 ft in hallway. Pt seemed greatly improved but still had periods of dizziness and a headache before we got her back to room.

## 2016-07-22 LAB — URINE CULTURE: Culture: NO GROWTH

## 2016-07-23 ENCOUNTER — Encounter: Payer: Self-pay | Admitting: Neurology

## 2016-07-23 ENCOUNTER — Ambulatory Visit (INDEPENDENT_AMBULATORY_CARE_PROVIDER_SITE_OTHER): Payer: 59 | Admitting: Neurology

## 2016-07-23 VITALS — BP 112/70 | HR 68 | Ht 68.0 in | Wt 175.5 lb

## 2016-07-23 DIAGNOSIS — G459 Transient cerebral ischemic attack, unspecified: Secondary | ICD-10-CM

## 2016-07-23 DIAGNOSIS — M329 Systemic lupus erythematosus, unspecified: Secondary | ICD-10-CM | POA: Diagnosis not present

## 2016-07-23 DIAGNOSIS — M3219 Other organ or system involvement in systemic lupus erythematosus: Secondary | ICD-10-CM

## 2016-07-23 DIAGNOSIS — R42 Dizziness and giddiness: Secondary | ICD-10-CM | POA: Diagnosis not present

## 2016-07-23 DIAGNOSIS — G43709 Chronic migraine without aura, not intractable, without status migrainosus: Secondary | ICD-10-CM | POA: Diagnosis not present

## 2016-07-23 DIAGNOSIS — D6869 Other thrombophilia: Secondary | ICD-10-CM

## 2016-07-23 MED ORDER — NAPROXEN SODIUM 550 MG PO TABS
550.0000 mg | ORAL_TABLET | Freq: Two times a day (BID) | ORAL | 3 refills | Status: DC | PRN
Start: 2016-07-23 — End: 2016-08-11

## 2016-07-23 MED ORDER — NORTRIPTYLINE HCL 25 MG PO CAPS: 25.0000 mg | ORAL_CAPSULE | Freq: Every day | ORAL | 3 refills | Status: DC

## 2016-07-23 NOTE — Patient Instructions (Addendum)
Migraine Recommendations: 1.  Start nortriptyline 25mg  at bedtime to prevent migraines.  Call in 4 weeks with update and we can adjust dose if needed. 2.  Take naproxen 550mg  at earliest onset of headache.  May repeat dose once in 12 hours if needed.  Do not exceed two tablets in 24 hours. 3.  Limit use of pain relievers to no more than 2 days out of the week.  These medications include acetaminophen, ibuprofen, triptans and narcotics.  This will help reduce risk of rebound headaches. 4.  Be aware of common food triggers such as processed sweets, processed foods with nitrites (such as deli meat, hot dogs, sausages), foods with MSG, alcohol (such as wine), chocolate, certain cheeses, certain fruits (dried fruits, some citrus fruit), vinegar, diet soda. 4.  Avoid caffeine 5.  Routine exercise 6.  Proper sleep hygiene 7.  Stay adequately hydrated with water 8.  Keep a headache diary. 9.  Maintain proper stress management. 10.  Do not skip meals. 11.  Consider supplements:  Magnesium oxide 400mg  to 600mg  daily, riboflavin 400mg , Coenzyme Q 10 100mg  three times daily 12.  For dizziness, use meclizine 25mg  three times daily as needed (you were given prescription in ED) 13.  Follow up in 3 months but contact me in 4 weeks with update. 14.  Follow up with kidney doctor and hematologist

## 2016-07-23 NOTE — Progress Notes (Signed)
Chart forwarded.  

## 2016-07-23 NOTE — Progress Notes (Signed)
NEUROLOGY CONSULTATION NOTE  Lauren Chung MRN: [id number] DOB: February 10, 1966  Referring provider: ED referral  Primary care provider: Eloise Levels, NP  Reason for consult:  vertigo  HISTORY OF PRESENT ILLNESS: Lauren Chung is a 50 year old right-handed woman with complex medical history, including lupus, migraine, hypertension, and history of PE, stroke and TIA who presents for vertigo.  History obtained by patient and ED note.  She recently moved to [place] from [place].  She has a complex medical history, which is not readily available to me.  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  She has history of multiple PEs.  She was on anticoagulation for a while but took herself off of it.  She reportedly was diagnosed with TIA after presenting to [facility] in [place] with dizziness, syncope, slurred speech and left sided weakness.  She was diagnosed with worsening renal function, requiring follow up with nephrology.  She reports memory problems since her TIA last year.  She also has anxiety, for which she takes alprazolam.  She has longstanding history of migraines and spells of vertigo.  Migraines are posterior and radiate down both sides of her neck.  It is an undulating quality, 10/10 intensity.  They are associated with nausea, vomiting, photophobia and phonophobia.  They last 2 to 3 days and has headache almost daily.  She also has frequent episodes of dizziness, described as spinning that is persistent and not positional.  Sometimes it is associated with migraine and sometimes it occurs alone.  For migraines, she takes ibuprofen which is ineffective. She is allergic to acetaminophen and codeine.  On 07/21/16, She developed another episode of vertigo, but associated with numbness of all her extremities, as well as confusion.  She then developed her migraine.  For three days prior, she had not been feeling well, endorsing low back pain,  subjective fever, upper respiratory symptoms, and diaphoresis.  She had trouble remembering the name of her daughter.  In the ED, she was found to be disoriented to time.  She was afebrile.  CT of head was personally reviewed and was negative for acute abnormalities.  MRI of brain was personally reviewed and revealed incidental mild chronic small vessel ischemic changes and 1 cm meningioma in the right parietal lobe, but otherwise unremarkable.  CBC revealed borderline elevated WBC of 11.3 but overall unremarkable.  CMP was unremarkable with renal function BUN of 7, Cr 1.03 and GFR over 60.  Lactic acid was 1.40.  UA was negative for infection.  Urine drug screen was positive for benzodiazepines.  She was given IV fluids and meclizine.  She has already established care with Dr. Trudie Reed, of rheumatology.  She has upcoming appointment with hematology.  She still needs appointment with nephrology.  Her GFR on 05/08/16 was 57.  However, the GFR on 07/21/16 was over 60.  PAST MEDICAL HISTORY: Past Medical History:  Diagnosis Date  . Lupus ([place])   . PE (pulmonary embolism)   . Stroke ([place])   . TIA (transient ischemic attack)     PAST SURGICAL HISTORY: Past Surgical History:  Procedure Laterality Date  . ABDOMINAL SURGERY    . ABLATION      MEDICATIONS: Current Outpatient Prescriptions on File Prior to Visit  Medication Sig Dispense Refill  . ibuprofen (ADVIL,MOTRIN) 800 MG tablet Take 800 mg by mouth every 8 (eight) hours as needed for moderate pain. pain  0  . triamterene-hydrochlorothiazide (MAXZIDE-25) 37.5-25 MG tablet Take 1  tablet by mouth daily.  0  . meclizine (ANTIVERT) 25 MG tablet Take 1 tablet (25 mg total) by mouth 3 (three) times daily as needed for dizziness. (Patient not taking: Reported on 07/23/2016) 30 tablet 0  . ondansetron (ZOFRAN) 4 MG tablet Take 1 tablet (4 mg total) by mouth every 6 (six) hours. (Patient not taking: Reported on 07/23/2016) 12 tablet 0   No current  facility-administered medications on file prior to visit.     ALLERGIES: Allergies  Allergen Reactions  . Codeine Shortness Of Breath  . Contrast Media [Iodinated Diagnostic Agents] Shortness Of Breath  . Tylenol [Acetaminophen] Shortness Of Breath    FAMILY HISTORY: Family History  Problem Relation Age of Onset  . Leukemia Father   . Seizures Daughter     SOCIAL HISTORY: Social History   Social History  . Marital status: Legally Separated    Spouse name: N/A  . Number of children: N/A  . Years of education: N/A   Occupational History  . Not on file.   Social History Main Topics  . Smoking status: Never Smoker  . Smokeless tobacco: Never Used  . Alcohol use Not on file  . Drug use: Unknown  . Sexual activity: Not on file   Other Topics Concern  . Not on file   Social History Narrative  . No narrative on file    REVIEW OF SYSTEMS: Constitutional: No fevers, chills, or sweats, no generalized fatigue, change in appetite Eyes: No visual changes, double vision, eye pain Ear, nose and throat: No hearing loss, ear pain, nasal congestion, sore throat Cardiovascular: No chest pain, palpitations Respiratory:  No shortness of breath at rest or with exertion, wheezes GastrointestinaI: No nausea, vomiting, diarrhea, abdominal pain, fecal incontinence Genitourinary:  No dysuria, urinary retention or frequency Musculoskeletal:   back pain Integumentary: No rash, pruritus, skin lesions Neurological: as above Psychiatric: anxiety, insomnia Endocrine: No palpitations, fatigue, diaphoresis, mood swings, change in appetite, change in weight, increased thirst Hematologic/Lymphatic:  No purpura, petechiae. Allergic/Immunologic: no itchy/runny eyes, nasal congestion, recent allergic reactions, rashes  PHYSICAL EXAM: Vitals:   07/23/16 0804  BP: 112/70  Pulse: 68   General: No acute distress.  Patient appears well-groomed.  Head:  Normocephalic/atraumatic Eyes:  fundi  examined but not visualized Neck: supple, no paraspinal tenderness, full range of motion Back: No paraspinal tenderness Heart: regular rate and rhythm Lungs: Clear to auscultation bilaterally. Vascular: No carotid bruits. Neurological Exam: Mental status: alert and oriented to person, place, and time, recent and remote memory intact, fund of knowledge intact, attention and concentration intact, speech fluent and not dysarthric, language intact. Cranial nerves: CN I: not tested CN II: pupils equal, round and reactive to light, visual fields intact CN III, IV, VI:  full range of motion, no nystagmus, no ptosis CN V: facial sensation intact CN VII: upper and lower face symmetric CN VIII: hearing intact CN IX, X: gag intact, uvula midline CN XI: sternocleidomastoid and trapezius muscles intact CN XII: tongue midline Bulk & Tone: normal, no fasciculations. Motor:  Trace weakness in left hand grip.  Otherwise, 5/5 throughout  Sensation:  Pinprick and vibration sensation intact. Deep Tendon Reflexes:  2+ throughout, toes downgoing. Finger to nose testing:  Without dysmetria.  Heel to shin:  Without dysmetria.  Gait:  Normal station and stride.  Able to turn and tandem walk. Romberg negative.  IMPRESSION: Chronic migraine and episodic vertigo.  Vertigo may be related to migraine or anxiety.  She has a complex  medical history including diagnosis of TIA, lupus vasculitis and thrombophilia.  This limits abortive treatment of migraines, as triptans would be contraindicated.  She reports renal dysfunction but recent renal function was normal.  Some of her symptoms may be psychosomatic, such as her memory deficits.  MRI showed no evidence of prior stroke or significant cerebrovascular disease which would account for residual memory problems.  She exhibits trace left hand grip weakness, which may be functional.    PLAN: 1.  To sort this out, I will need to obtain records from the hospital in [place], where she was treated and diagnosed for these various conditions.  I will also obtain note from Dr. Trudie Reed, her rheumatologist here in [place].   2.  We will start nortriptyline 25mg  at bedtime for migraine prevention. 3.  We will start naproxen 550mg  for abortive therapy.  She should take meclizine for dizziness.  Another option for PCP to consider would be Klonopin to treat the vertigo. 4.  She should follow up with hematology as she will need to restart anticoagulation.  She should probably follow up with nephrology as well. 5.  Follow up with me in 3 months.  She should contact me in 4 weeks with update.  Thank you for allowing me to take part in the care of this patient.  Metta Clines, DO  CC:  Eloise Levels, NP

## 2016-08-07 DIAGNOSIS — M064 Inflammatory polyarthropathy: Secondary | ICD-10-CM | POA: Diagnosis not present

## 2016-08-07 DIAGNOSIS — M791 Myalgia: Secondary | ICD-10-CM | POA: Diagnosis not present

## 2016-08-07 DIAGNOSIS — M329 Systemic lupus erythematosus, unspecified: Secondary | ICD-10-CM | POA: Diagnosis not present

## 2016-08-07 DIAGNOSIS — M2550 Pain in unspecified joint: Secondary | ICD-10-CM | POA: Diagnosis not present

## 2016-08-11 ENCOUNTER — Ambulatory Visit (HOSPITAL_BASED_OUTPATIENT_CLINIC_OR_DEPARTMENT_OTHER): Payer: 59 | Admitting: Hematology

## 2016-08-11 ENCOUNTER — Ambulatory Visit: Payer: Medicare Other

## 2016-08-11 ENCOUNTER — Telehealth: Payer: Self-pay | Admitting: Hematology

## 2016-08-11 ENCOUNTER — Encounter: Payer: Self-pay | Admitting: Hematology

## 2016-08-11 ENCOUNTER — Ambulatory Visit (HOSPITAL_BASED_OUTPATIENT_CLINIC_OR_DEPARTMENT_OTHER): Payer: 59

## 2016-08-11 VITALS — BP 131/79 | HR 96 | Temp 98.7°F | Resp 18 | Wt 173.4 lb

## 2016-08-11 DIAGNOSIS — D6859 Other primary thrombophilia: Secondary | ICD-10-CM

## 2016-08-11 DIAGNOSIS — R634 Abnormal weight loss: Secondary | ICD-10-CM

## 2016-08-11 DIAGNOSIS — I2699 Other pulmonary embolism without acute cor pulmonale: Secondary | ICD-10-CM

## 2016-08-11 DIAGNOSIS — Z1231 Encounter for screening mammogram for malignant neoplasm of breast: Secondary | ICD-10-CM

## 2016-08-11 LAB — CBC & DIFF AND RETIC
BASO%: 0.7 % (ref 0.0–2.0)
BASOS ABS: 0.1 10*3/uL (ref 0.0–0.1)
EOS%: 5.7 % (ref 0.0–7.0)
Eosinophils Absolute: 0.4 10*3/uL (ref 0.0–0.5)
HEMATOCRIT: 36.6 % (ref 34.8–46.6)
HEMOGLOBIN: 11.7 g/dL (ref 11.6–15.9)
IMMATURE RETIC FRACT: 7.9 % (ref 1.60–10.00)
LYMPH#: 1.8 10*3/uL (ref 0.9–3.3)
LYMPH%: 25.6 % (ref 14.0–49.7)
MCH: 24.6 pg — ABNORMAL LOW (ref 25.1–34.0)
MCHC: 32 g/dL (ref 31.5–36.0)
MCV: 76.9 fL — ABNORMAL LOW (ref 79.5–101.0)
MONO#: 0.4 10*3/uL (ref 0.1–0.9)
MONO%: 6.1 % (ref 0.0–14.0)
NEUT#: 4.4 10*3/uL (ref 1.5–6.5)
NEUT%: 61.9 % (ref 38.4–76.8)
PLATELETS: 314 10*3/uL (ref 145–400)
RBC: 4.76 10*6/uL (ref 3.70–5.45)
RDW: 19.6 % — ABNORMAL HIGH (ref 11.2–14.5)
RETIC CT ABS: 47.6 10*3/uL (ref 33.70–90.70)
Retic %: 1 % (ref 0.70–2.10)
WBC: 7 10*3/uL (ref 3.9–10.3)

## 2016-08-11 LAB — COMPREHENSIVE METABOLIC PANEL
ALBUMIN: 4 g/dL (ref 3.5–5.0)
ALK PHOS: 84 U/L (ref 40–150)
ALT: 18 U/L (ref 0–55)
ANION GAP: 9 meq/L (ref 3–11)
AST: 28 U/L (ref 5–34)
BILIRUBIN TOTAL: 0.5 mg/dL (ref 0.20–1.20)
BUN: 7.3 mg/dL (ref 7.0–26.0)
CALCIUM: 9.5 mg/dL (ref 8.4–10.4)
CO2: 24 mEq/L (ref 22–29)
CREATININE: 1 mg/dL (ref 0.6–1.1)
Chloride: 108 mEq/L (ref 98–109)
EGFR: 68 mL/min/{1.73_m2} — ABNORMAL LOW (ref >90)
Glucose: 80 mg/dl (ref 70–140)
Potassium: 3.4 mEq/L — ABNORMAL LOW (ref 3.5–5.1)
Sodium: 142 mEq/L (ref 136–145)
TOTAL PROTEIN: 7.8 g/dL (ref 6.4–8.3)

## 2016-08-11 MED ORDER — WARFARIN SODIUM 5 MG PO TABS: 5.0000 mg | ORAL_TABLET | Freq: Every day | ORAL | 0 refills | Status: DC

## 2016-08-11 NOTE — Progress Notes (Signed)
[facility]    HEMATOLOGY/ONCOLOGY CONSULTATION NOTE  Date of Service: 08/11/2016  Patient Care Team: Eloise Levels, NP as PCP - General (Nurse Practitioner) Gavin Pound MD ( Rheumatology)  CHIEF COMPLAINTS/PURPOSE OF CONSULTATION:  h/o DVT/PE  HISTORY OF PRESENTING ILLNESS:   Lauren Chung is a wonderful 50 y.o. female who has been referred to [place] by Dr .Eloise Levels, NP for evaluation and recommendations regarding anticoagulation for her previously h/o DVT/PE.  Patient has a history of systemic lupus erythematosus and is following up with rheumatology for ongoing management of this. She notes that she has had significant left pelvic DVT and bilateral pulmonary embolism in 2010 in [place]. Notes that she might have had another DVT but does not remember accurately. She was recommended long term blood thinners and was on Coumadin until November 2016 when she decided to stop this by herself.  Patient does not note any new symptoms of bilateral lower extremity swelling chest pain or shortness of breath. She overall is somewhat hesitant to be on anticoagulation. We discussed that we don't have details on her initial presentation and recurrent events. We have tried to request outside records from her previous hospital but are still waiting for these.  She would like to know if she has any genetic or acquired factors that could increase her risk of venous thromboembolism and we sent out a hypercoagulable workup which was unrevealing as noted below.  We discussed that her chronic inflammatory disorder/SMV can certainly up regulate certain clotting factors and increase risk of venous thromboembolism. She has no clear family history of blood clots.  No focal symptoms suggestive of malignancy.  MEDICAL HISTORY:  Past Medical History:  Diagnosis Date  . Lupus   . PE (pulmonary embolism)   . Stroke ([place])   . TIA (transient ischemic attack)   Systemic lupus erythematosus  diagnosed in her 10s when she presented with joint pain swelling, DVTs, rashes and possible lupus nephritis Hypertension Mitral valve prolapse Migraine headaches Fibromyalgia Depression Vertigo which the patient describes as passing out episodes. History of left pelvic DVT and pulmonary embolism in 2010 - patient reports that she was treated in [place] and had a submassive PE. Patient knows that she was on anticoagulation with Coumadin until November 2016 when she stopped by herself. Isn't able to provide more detailed account however her rheumatology note suggests possibility of multiple DVTs. Antiphospholipid antibody testing negative with rheumatologist History of a bleeding gastric ulcer in 2015 requiring EGD with laser photocoagulation, plasma and PRBCs.  SURGICAL HISTORY: Past Surgical History:  Procedure Laterality Date  . ABDOMINAL SURGERY    . ABLATION      SOCIAL HISTORY: Social History   Social History  . Marital status: Legally Separated    Spouse name: N/A  . Number of children: N/A  . Years of education: N/A   Occupational History  . Not on file.   Social History Main Topics  . Smoking status: Never Smoker  . Smokeless tobacco: Never Used  . Alcohol use No  . Drug use: No  . Sexual activity: Not Currently   Other Topics Concern  . Not on file   Social History Narrative  . No narrative on file  Nonsmoker No alcohol use Known drug use  FAMILY HISTORY: Family History  Problem Relation Age of Onset  . Leukemia Father   . Seizures Daughter     ALLERGIES:  is allergic to codeine; contrast media [iodinated diagnostic agents]; and tylenol [  acetaminophen].  MEDICATIONS:  Current Outpatient Prescriptions  Medication Sig Dispense Refill  . alprazolam (XANAX) 2 MG tablet Take 2 mg by mouth at bedtime as needed for sleep.    Marland Kitchen ibuprofen (ADVIL,MOTRIN) 800 MG tablet Take 800 mg by mouth every 8 (eight) hours as needed for moderate pain. pain  0  .  naproxen sodium (ANAPROX) 550 MG tablet Take 1 tablet (550 mg total) by mouth every 12 (twelve) hours as needed. 16 tablet 3  . nortriptyline (PAMELOR) 25 MG capsule Take 1 capsule (25 mg total) by mouth at bedtime. 30 capsule 3  . triamterene-hydrochlorothiazide (MAXZIDE-25) 37.5-25 MG tablet Take 1 tablet by mouth daily.  0  . zolpidem (AMBIEN CR) 12.5 MG CR tablet TK 1 T PO QD HS  1   No current facility-administered medications for this visit.     REVIEW OF SYSTEMS:    10 Point review of Systems was done is negative except as noted above.  PHYSICAL EXAMINATION: ECOG PERFORMANCE STATUS: 1 - Symptomatic but completely ambulatory  . Vitals:   08/11/16 1353  BP: 131/79  Pulse: 96  Resp: 18  Temp: 98.7 F (37.1 C)   Filed Weights   08/11/16 1353  Weight: 173 lb 6.4 oz (78.7 kg)   .Body mass index is 26.37 kg/m.  GENERAL:alert, in no acute distress and comfortable SKIN: skin color, texture, turgor are normal, no rashes or significant lesions EYES: normal, conjunctiva are pink and non-injected, sclera clear OROPHARYNX:no exudate, no erythema and lips, buccal mucosa, and tongue normal  NECK: supple, no JVD, thyroid normal size, non-tender, without nodularity LYMPH:  no palpable lymphadenopathy in the cervical, axillary or inguinal LUNGS: clear to auscultation with normal respiratory effort HEART: regular rate & rhythm,  no murmurs and no lower extremity edema ABDOMEN: abdomen soft, non-tender, normoactive bowel sounds  Musculoskeletal: no cyanosis of digits and no clubbing  PSYCH: alert & oriented x 3 with fluent speech NEURO: no focal motor/sensory deficits  LABORATORY DATA:  I have reviewed the data as listed  . CBC Latest Ref Rng & Units 07/21/2016 12/07/2015  WBC 4.0 - 10.5 K/uL 11.3(H) 10.5  Hemoglobin 12.0 - 15.0 g/dL 12.1 12.4  Hematocrit 36.0 - 46.0 % 38.4 38.6  Platelets 150 - 400 K/uL 325 428(H)    . CMP Latest Ref Rng & Units 07/21/2016 12/07/2015  Glucose 65  - 99 mg/dL 79 93  BUN 6 - 20 mg/dL 7 17  Creatinine 0.44 - 1.00 mg/dL 1.03(H) 1.16(H)  Sodium 135 - 145 mmol/L 138 137  Potassium 3.5 - 5.1 mmol/L 3.2(L) 3.4(L)  Chloride 101 - 111 mmol/L 111 97(L)  CO2 22 - 32 mmol/L 20(L) 26  Calcium 8.9 - 10.3 mg/dL 9.5 9.5  Total Protein 6.5 - 8.1 g/dL 7.4 7.6  Total Bilirubin 0.3 - 1.2 mg/dL 0.2(L) 0.5  Alkaline Phos 38 - 126 U/L 63 85  AST 15 - 41 U/L 23 30  ALT 14 - 54 U/L 16 25   Component     Latest Ref Rng & Units 08/11/2016  WBC     3.9 - 10.3 10e3/uL 7.0  NEUT#     1.5 - 6.5 10e3/uL 4.4  Hemoglobin     11.6 - 15.9 g/dL 11.7  HCT     34.8 - 46.6 % 36.6  Platelets     145 - 400 10e3/uL 314  MCV     79.5 - 101.0 fL 76.9 (L)  MCH     25.1 - 34.0  pg 24.6 (L)  MCHC     31.5 - 36.0 g/dL 32.0  RBC     3.70 - 5.45 10e6/uL 4.76  RDW     11.2 - 14.5 % 19.6 (H)  lymph#     0.9 - 3.3 10e3/uL 1.8  MONO#     0.1 - 0.9 10e3/uL 0.4  Eosinophils Absolute     0.0 - 0.5 10e3/uL 0.4  Basophils Absolute     0.0 - 0.1 10e3/uL 0.1  NEUT%     38.4 - 76.8 % 61.9  LYMPH%     14.0 - 49.7 % 25.6  MONO%     0.0 - 14.0 % 6.1  EOS%     0.0 - 7.0 % 5.7  BASO%     0.0 - 2.0 % 0.7  Retic %     0.70 - 2.10 % 1.00  Retic Ct Abs     33.70 - 90.70 10e3/uL 47.60  Immature Retic Fract     1.60 - 10.00 % 7.90  Sodium     136 - 145 mEq/L 142  Potassium     3.5 - 5.1 mEq/L 3.4 (L)  Chloride     98 - 109 mEq/L 108  CO2     22 - 29 mEq/L 24  Glucose     70 - 140 mg/dl 80  BUN     7.0 - 26.0 mg/dL 7.3  Creatinine     0.6 - 1.1 mg/dL 1.0  Total Bilirubin     0.20 - 1.20 mg/dL 0.50  Alkaline Phosphatase     40 - 150 U/L 84  AST     5 - 34 U/L 28  ALT     0 - 55 U/L 18  Total Protein     6.4 - 8.3 g/dL 7.8  Albumin     3.5 - 5.0 g/dL 4.0  Calcium     8.4 - 10.4 mg/dL 9.5  Anion gap     3 - 11 mEq/L 9  EGFR     >90 ml/min/1.73 m2 68 (L)  Anticardiolipin Ab,IgG,Qn     0 - 14 GPL U/mL <9  Anticardiolipin Ab,IgM,Qn     0 - 12 MPL  U/mL <9  Anticardiolipin Ab,IgA,Qn     0 - 11 APL U/mL <9  Beta-2 Glycoprotein I Ab, IgG     0 - 20 GPI IgG units <9  Beta-2 Glyco 1 IgA     0 - 25 GPI IgA units <9  Beta-2 Glyco 1 IgM     0 - 32 GPI IgM units <9  Sed Rate     0 - 32 mm/hr 7  TSH     0.308 - 3.960 m(IU)/L 1.272  T4,Free(Direct)     0.82 - 1.77 ng/dL 0.98   Prothrombin gene mutation  Order: [id number]  Status:  Final result Visible to patient:  Yes (MyChart) Next appt:  None Dx:  Primary hypercoagulable state ([place]); ...   42moago  Factor II, DNA Analysis Comment   Comments: NEGATIVE  No mutation identified.        Factor 5 leiden  Order: [id number] Status:  Final result Visible to patient:  Yes (MyChart) Next appt:  None Dx:  Primary hypercoagulable state ([place]; ...   768mogo  Factor V Leiden Comment   Comments: Result: Negative (no mutation found)          RADIOGRAPHIC STUDIES: I have personally reviewed the radiological images as listed  and agreed with the findings in the report. Ct Head Wo Contrast  Result Date: 07/21/2016 CLINICAL DATA:  Dizziness, nausea, and vomiting this evening. EXAM: CT HEAD WITHOUT CONTRAST TECHNIQUE: Contiguous axial images were obtained from the base of the skull through the vertex without intravenous contrast. COMPARISON:  None. FINDINGS: Brain: No evidence of acute infarction, hemorrhage, hydrocephalus, extra-axial collection or mass lesion/mass effect. Vascular: No hyperdense vessel or unexpected calcification. Skull: Normal. Negative for fracture or focal lesion. Sinuses/Orbits: No acute finding. Other: Subcutaneous soft tissue scalp nodules with calcification likely representing trichilemmal cysts. IMPRESSION: No acute intracranial abnormalities. Electronically Signed   By: Lucienne Capers M.D.   On: 07/21/2016 03:12   Mr Brain Wo Contrast  Result Date: 07/21/2016 CLINICAL DATA:  Dizziness.  Lupus.  Confusion EXAM: MRI HEAD WITHOUT CONTRAST TECHNIQUE: Multiplanar,  multiecho pulse sequences of the brain and surrounding structures were obtained without intravenous contrast. COMPARISON:  CT head 07/21/2016 FINDINGS: Brain: Ventricle size normal.  Cerebral volume normal. Negative for acute infarct. Scattered small white matter hyperintensities bilaterally most consistent with chronic microvascular ischemia. Negative for hemorrhage. 1 cm calcified dural based lesion right parietal lobe most compatible with meningioma. No edema. Vascular: Normal flow voids. Skull and upper cervical spine: Normal marrow signal. Sinuses/Orbits: Mild mucosal edema in the paranasal sinuses. Small air-fluid level in the right sphenoid sinus. Mastoid sinus is clear. Other: Cutaneous scalp cysts left frontal region compatible with Pilar cysts. IMPRESSION: No acute intracranial abnormality. Mild chronic white matter changes most likely due to microvascular ischemia Small air-fluid level right sphenoid sinus. Electronically Signed   By: Franchot Gallo M.D.   On: 07/21/2016 17:58    ASSESSMENT & PLAN:   50 year old female with history of SLE with   #1 Significant left pelvic DVT with bilateral pulmonary embolism in 2010 unclear provoking factors. Patient has been a nonsmoker with no family history of venous thromboembolism.  #2 recurrent DVT as per records. Patient unable to give details.  #3 history of bleeding gastric ulcer 2015 status post EGD with laser photocoagulation. Needed transfusions.  #3 SLE- follows with Dr. Trudie Reed Plan -I discussed with the patient that not having details regarding her provoking factor for her extensive DVT and PE limits are analysis. We have requested outside records and haven't been able to gain them yet. -If the significant left pelvic DVT and bilateral pulmonary embolism/submassive were clearly unprovoked that would be a good argument to continue long-term blood thinners in the absence of bleeding risk. -If she has had more than 1 DVT/PE without clear  provoking factors that make the argument to continue long-term blood thinners even stronger. -A hypercoagulable workup was done and was unrevealing for a specific testable thrombophilia however as the patient was counseled 50% of people could have a primary hypercoagulable state that is not picked up by standard thrombophilia workup. -If there are no contraindications for excessive risk of bleeding would recommend long-term anticoagulation based on above recommendations. -if the patient declines long-term anticoagulation despite understanding the risks of recurrent vte, she should at least be on a daily aspirin -Would need to avoid NSAIDs to reduce the risk of recurrent gastric ulcers and GI bleeding .  All of the patients questions were answered with apparent satisfaction. The patient knows to call the clinic with any problems, questions or concerns.  I spent 40 minutes counseling the patient face to face. The total time spent in the appointment was 60 minutes and more than 50% was on counseling and direct patient  cares.    Sullivan Lone MD [place] [facility]AHIVMS [facility] Hematology/Oncology Physician [facility]  (Office):       [phone] (Work cell):  [phone] (Fax):           [phone]  08/11/2016 3:01 PM

## 2016-08-11 NOTE — Patient Instructions (Signed)
-  Followup with your primary care physician in 5 days to monitor and adjust coumadin dose

## 2016-08-11 NOTE — Telephone Encounter (Signed)
Lab add on for today 08/11/16 per los. Avs report and appointment schedule given to patient, per 10/09/017 los.

## 2016-08-12 LAB — TSH: TSH: 1.272 m[IU]/L (ref 0.308–3.960)

## 2016-08-12 LAB — T4, FREE: T4,Free(Direct): 0.98 ng/dL (ref 0.82–1.77)

## 2016-08-12 LAB — LUPUS ANTICOAGULANT PANEL
PTT-LA: 31.7 s (ref 0.0–51.9)
dRVVT: 39.3 s (ref 0.0–47.0)

## 2016-08-12 LAB — SEDIMENTATION RATE: SED RATE: 7 mm/h (ref 0–32)

## 2016-08-13 LAB — BETA-2-GLYCOPROTEIN I ABS, IGG/M/A
Beta-2 Glyco 1 IgA: <9 GPI IgA units (ref 0–25)
Beta-2 Glyco 1 IgM: <9 GPI IgM units (ref 0–32)

## 2016-08-13 LAB — CARDIOLIPIN ANTIBODIES, IGG, IGM, IGA
Anticardiolipin Ab,IgA,Qn: <9 APL U/mL (ref 0–11)
Anticardiolipin Ab,IgG,Qn: <9 GPL U/mL (ref 0–14)
Anticardiolipin Ab,IgM,Qn: <9 MPL U/mL (ref 0–12)

## 2016-08-14 LAB — FACTOR 5 LEIDEN

## 2016-08-15 LAB — PROTHROMBIN GENE MUTATION

## 2016-08-18 DIAGNOSIS — Z23 Encounter for immunization: Secondary | ICD-10-CM | POA: Diagnosis not present

## 2016-08-18 DIAGNOSIS — D500 Iron deficiency anemia secondary to blood loss (chronic): Secondary | ICD-10-CM | POA: Diagnosis not present

## 2016-08-18 DIAGNOSIS — R55 Syncope and collapse: Secondary | ICD-10-CM | POA: Diagnosis not present

## 2016-08-18 DIAGNOSIS — Z7901 Long term (current) use of anticoagulants: Secondary | ICD-10-CM | POA: Diagnosis not present

## 2016-08-18 DIAGNOSIS — F418 Other specified anxiety disorders: Secondary | ICD-10-CM | POA: Diagnosis not present

## 2016-08-18 DIAGNOSIS — Z86711 Personal history of pulmonary embolism: Secondary | ICD-10-CM | POA: Diagnosis not present

## 2016-08-22 DIAGNOSIS — Z7901 Long term (current) use of anticoagulants: Secondary | ICD-10-CM | POA: Diagnosis not present

## 2016-08-22 DIAGNOSIS — Z86711 Personal history of pulmonary embolism: Secondary | ICD-10-CM | POA: Diagnosis not present

## 2016-08-29 DIAGNOSIS — Z86711 Personal history of pulmonary embolism: Secondary | ICD-10-CM | POA: Diagnosis not present

## 2016-08-29 DIAGNOSIS — Z7901 Long term (current) use of anticoagulants: Secondary | ICD-10-CM | POA: Diagnosis not present

## 2016-09-03 DIAGNOSIS — Z86711 Personal history of pulmonary embolism: Secondary | ICD-10-CM | POA: Diagnosis not present

## 2016-09-03 DIAGNOSIS — Z7901 Long term (current) use of anticoagulants: Secondary | ICD-10-CM | POA: Diagnosis not present

## 2016-09-09 DIAGNOSIS — Z86711 Personal history of pulmonary embolism: Secondary | ICD-10-CM | POA: Diagnosis not present

## 2016-09-09 DIAGNOSIS — Z7901 Long term (current) use of anticoagulants: Secondary | ICD-10-CM | POA: Diagnosis not present

## 2016-09-15 DIAGNOSIS — Z86711 Personal history of pulmonary embolism: Secondary | ICD-10-CM | POA: Diagnosis not present

## 2016-09-15 DIAGNOSIS — Z7901 Long term (current) use of anticoagulants: Secondary | ICD-10-CM | POA: Diagnosis not present

## 2016-09-17 ENCOUNTER — Other Ambulatory Visit: Payer: Self-pay | Admitting: Hematology

## 2016-09-22 DIAGNOSIS — Z7901 Long term (current) use of anticoagulants: Secondary | ICD-10-CM | POA: Diagnosis not present

## 2016-09-22 DIAGNOSIS — Z86711 Personal history of pulmonary embolism: Secondary | ICD-10-CM | POA: Diagnosis not present

## 2016-09-29 ENCOUNTER — Encounter (HOSPITAL_COMMUNITY): Payer: Self-pay | Admitting: Psychiatry

## 2016-09-29 ENCOUNTER — Other Ambulatory Visit (HOSPITAL_COMMUNITY): Payer: 59 | Attending: Psychiatry | Admitting: Psychiatry

## 2016-09-29 DIAGNOSIS — Z886 Allergy status to analgesic agent status: Secondary | ICD-10-CM | POA: Insufficient documentation

## 2016-09-29 DIAGNOSIS — F4310 Post-traumatic stress disorder, unspecified: Secondary | ICD-10-CM | POA: Insufficient documentation

## 2016-09-29 DIAGNOSIS — G4700 Insomnia, unspecified: Secondary | ICD-10-CM | POA: Insufficient documentation

## 2016-09-29 DIAGNOSIS — F332 Major depressive disorder, recurrent severe without psychotic features: Secondary | ICD-10-CM | POA: Insufficient documentation

## 2016-09-29 DIAGNOSIS — Z813 Family history of other psychoactive substance abuse and dependence: Secondary | ICD-10-CM | POA: Diagnosis not present

## 2016-09-29 DIAGNOSIS — Z7901 Long term (current) use of anticoagulants: Secondary | ICD-10-CM | POA: Diagnosis not present

## 2016-09-29 DIAGNOSIS — Z8673 Personal history of transient ischemic attack (TIA), and cerebral infarction without residual deficits: Secondary | ICD-10-CM | POA: Diagnosis not present

## 2016-09-29 DIAGNOSIS — Z885 Allergy status to narcotic agent status: Secondary | ICD-10-CM | POA: Diagnosis not present

## 2016-09-29 DIAGNOSIS — Z820 Family history of epilepsy and other diseases of the nervous system: Secondary | ICD-10-CM | POA: Insufficient documentation

## 2016-09-29 DIAGNOSIS — M329 Systemic lupus erythematosus, unspecified: Secondary | ICD-10-CM | POA: Insufficient documentation

## 2016-09-29 DIAGNOSIS — Z86711 Personal history of pulmonary embolism: Secondary | ICD-10-CM | POA: Diagnosis not present

## 2016-09-29 DIAGNOSIS — Z91041 Radiographic dye allergy status: Secondary | ICD-10-CM | POA: Insufficient documentation

## 2016-09-29 DIAGNOSIS — Z811 Family history of alcohol abuse and dependence: Secondary | ICD-10-CM | POA: Diagnosis not present
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
## 2016-09-30 ENCOUNTER — Other Ambulatory Visit (HOSPITAL_COMMUNITY): Payer: 59 | Admitting: Psychiatry

## 2016-09-30 ENCOUNTER — Encounter (HOSPITAL_COMMUNITY): Payer: Self-pay | Admitting: Psychiatry

## 2016-09-30 DIAGNOSIS — Z86711 Personal history of pulmonary embolism: Secondary | ICD-10-CM | POA: Diagnosis not present

## 2016-09-30 DIAGNOSIS — F332 Major depressive disorder, recurrent severe without psychotic features: Secondary | ICD-10-CM | POA: Insufficient documentation

## 2016-09-30 DIAGNOSIS — Z7901 Long term (current) use of anticoagulants: Secondary | ICD-10-CM | POA: Diagnosis not present

## 2016-09-30 DIAGNOSIS — G4700 Insomnia, unspecified: Secondary | ICD-10-CM | POA: Diagnosis not present

## 2016-09-30 DIAGNOSIS — F4310 Post-traumatic stress disorder, unspecified: Secondary | ICD-10-CM | POA: Diagnosis not present

## 2016-09-30 DIAGNOSIS — M329 Systemic lupus erythematosus, unspecified: Secondary | ICD-10-CM | POA: Diagnosis not present

## 2016-09-30 NOTE — Progress Notes (Signed)
Psychiatric Initial Adult Assessment   Patient Identification: Lauren Chung MRN:  [id number] Date of Evaluation:  09/30/2016 Referral Source: self Chief Complaint: Depression  Visit Diagnosis: major depression, recurrent, severe without psychosis  History of Present Illness:  Lauren Chung has been depressed all her life after a dysfunctional childhood.  Her father was an Chief Financial Officer and supportive but her mother was a crack addict and very unsupportive.  Father died when she was 54 of leukemia and then she was at the mercy of her mother's addiction.  Her aunt who was supportive was murdered when the patient was 68 and that was when she got more depressed.  She was diagnosed with systemic lupus in her 20's and was hospitalized about 2 years ago with multiple organ failures for 8 months.  She left her husband about the same time and regrets that now as she was not in her right mind she says.  She reared 3 children who are all doing well.  She moved her about a year ago to be closer to one of her daughters and to be out of [place] and the weather there.   She is depressed to the point of hopelessness with no desire to live except for her dogs.  She believes her children can take care of themselves and do not need her.  She has all the symptoms of depression listed below.  In addition she is anxious worrying all the time and having trouble sleeping for both the depression and anxiety.  She denies active suicidal ideation but she wants to die and has taken care of the dogs in case she does die.  She has dissociative episodes to the point of fugue states where she comes to after several days and has no idea how she got there.  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  Just last night she was fighting just getting in her car with the dogs and going to wherever.   No investment in this program as she has no hope she will get  better.  She takes coumadin which makes using antidepressants difficult.  Alprazolam helps at bedtime.  Associated Signs/Symptoms: Depression Symptoms:  depressed mood, anhedonia, insomnia, fatigue, feelings of worthlessness/guilt, difficulty concentrating, hopelessness, impaired memory, anxiety, loss of energy/fatigue, weight loss, decreased appetite, (Hypo) Manic Symptoms:  Irritable Mood, Anxiety Symptoms:  Excessive Worry, Psychotic Symptoms:  none PTSD Symptoms: none  Past Psychiatric History: no inpatient but ongoing outpatient medication management and therapy with little help  Previous Psychotropic Medications: Yes   Substance Abuse History in the last 12 months:  No.  Consequences of Substance Abuse: Negative  Past Medical History:  Past Medical History:  Diagnosis Date  . Anxiety   . Depression   . Lupus   . PE (pulmonary embolism)   . PTSD (post-traumatic stress disorder)   . Stroke ([place])   . TIA (transient ischemic attack)     Past Surgical History:  Procedure Laterality Date  . ABDOMINAL SURGERY    . ABLATION      Family Psychiatric History: mother with addictions  Family History:  Family History  Problem Relation Age of Onset  . Leukemia Father   . Seizures Daughter   . Drug abuse Mother   . Alcohol abuse Mother     Social History:   Social History   Social History  . Marital status: Legally Separated    Spouse name: N/A  .  Number of children: N/A  . Years of education: N/A   Social History Main Topics  . Smoking status: Never Smoker  . Smokeless tobacco: Never Used  . Alcohol use No  . Drug use: No  . Sexual activity: Not Currently   Other Topics Concern  . Not on file   Social History Narrative  . No narrative on file    Additional Social History: none  Allergies:   Allergies  Allergen Reactions  . Codeine Shortness Of Breath  . Contrast Media [Iodinated Diagnostic Agents] Shortness Of Breath  . Tylenol  [Acetaminophen] Shortness Of Breath    Metabolic Disorder Labs: No results found for: HGBA1C, MPG No results found for: PROLACTIN No results found for: CHOL, TRIG, HDL, CHOLHDL, VLDL, LDLCALC   Current Medications: Current Outpatient Prescriptions  Medication Sig Dispense Refill  . alprazolam (XANAX) 2 MG tablet Take 2 mg by mouth at bedtime as needed for sleep.    . nortriptyline (PAMELOR) 25 MG capsule Take 1 capsule (25 mg total) by mouth at bedtime. 30 capsule 3  . triamterene-hydrochlorothiazide (MAXZIDE-25) 37.5-25 MG tablet Take 1 tablet by mouth daily.  0  . warfarin (COUMADIN) 5 MG tablet Take 1 tablet (5 mg total) by mouth daily at 6 PM. Adjust dose based on INR with primary care physician in 5 days 30 tablet 0  . zolpidem (AMBIEN CR) 12.5 MG CR tablet TK 1 T PO QD HS  1   No current facility-administered medications for this visit.     Neurologic: Headache: Negative Seizure: Negative Paresthesias:Negative  Musculoskeletal: Strength & Muscle Tone: within normal limits Gait & Station: normal Patient leans: N/A  Psychiatric Specialty Exam: ROS  There were no vitals taken for this visit.There is no height or weight on file to calculate BMI.  General Appearance: Well Groomed  Eye Contact:  Good  Speech:  Clear and Coherent  Volume:  Normal  Mood:  Anxious and Depressed  Affect:  Congruent  Thought Process:  Coherent  Orientation:  Full (Time, Place, and Person)  Thought Content:  Logical  Suicidal Thoughts:  No  Homicidal Thoughts:  No  Memory:  Immediate;   Good Recent;   Good Remote;   Good  Judgement:  Good  Insight:  Good  Psychomotor Activity:  Normal  Concentration:  Concentration: Good and Attention Span: Good  Recall:  Good  Fund of Knowledge:Good  Language: Good  Akathisia:  Negative  Handed:  Right  AIMS (if indicated):  0  Assets:  Communication Skills Housing Talents/Skills Vocational/Educational  ADL's:  Intact  Cognition: WNL  Sleep:   poor    Treatment Plan Summary: Admit to IOP with daily group therapy.  Lauren Chung is a risk for suicide as she is hopeless and believes her dogs would be taken care of her if something happens to her.  No active thoughts or plans   Donnelly Angelica, MD 11/28/20173:01 PM

## 2016-09-30 NOTE — Progress Notes (Signed)
Daily Group Progress Note     Program: IOP   Group Time: 10:45-12:00  Participation Level: Minimal  Behavioral Response: Quiet  Type of Therapy:  Psychoeducation/Therapy  Summary of Progress: Pt participated in a discussion on "How do I describe my depression to my loved ones? Pt participated in an activity drawing what her depression looks like. Pt shared that no one knows she is in therapy. Her family does not believe depression exists. She drew a picture of a small black box with a small figure in the black box. She shared this is how her depression feels. She does not feel safe enough to open the black box to let light in. Pt was very quiet and appeared sad during the discussion and activity.  Jenkins Rouge, LCAS

## 2016-10-01 ENCOUNTER — Other Ambulatory Visit (HOSPITAL_COMMUNITY): Payer: 59 | Admitting: Psychiatry

## 2016-10-01 DIAGNOSIS — M329 Systemic lupus erythematosus, unspecified: Secondary | ICD-10-CM | POA: Diagnosis not present

## 2016-10-01 DIAGNOSIS — F332 Major depressive disorder, recurrent severe without psychotic features: Secondary | ICD-10-CM

## 2016-10-01 DIAGNOSIS — G4700 Insomnia, unspecified: Secondary | ICD-10-CM | POA: Diagnosis not present

## 2016-10-01 DIAGNOSIS — Z7901 Long term (current) use of anticoagulants: Secondary | ICD-10-CM | POA: Diagnosis not present

## 2016-10-01 DIAGNOSIS — Z86711 Personal history of pulmonary embolism: Secondary | ICD-10-CM | POA: Diagnosis not present

## 2016-10-01 DIAGNOSIS — F4310 Post-traumatic stress disorder, unspecified: Secondary | ICD-10-CM | POA: Diagnosis not present

## 2016-10-02 ENCOUNTER — Other Ambulatory Visit (HOSPITAL_COMMUNITY): Payer: 59 | Admitting: Psychiatry

## 2016-10-02 DIAGNOSIS — F332 Major depressive disorder, recurrent severe without psychotic features: Secondary | ICD-10-CM | POA: Diagnosis not present

## 2016-10-02 DIAGNOSIS — Z86711 Personal history of pulmonary embolism: Secondary | ICD-10-CM | POA: Diagnosis not present

## 2016-10-02 DIAGNOSIS — G4700 Insomnia, unspecified: Secondary | ICD-10-CM | POA: Diagnosis not present

## 2016-10-02 DIAGNOSIS — F4310 Post-traumatic stress disorder, unspecified: Secondary | ICD-10-CM | POA: Diagnosis not present

## 2016-10-02 DIAGNOSIS — Z7901 Long term (current) use of anticoagulants: Secondary | ICD-10-CM | POA: Diagnosis not present

## 2016-10-02 DIAGNOSIS — M329 Systemic lupus erythematosus, unspecified: Secondary | ICD-10-CM | POA: Diagnosis not present

## 2016-10-02 NOTE — Progress Notes (Signed)
    Daily Group Progress Note  Program: IOP  Group Time: 9:00-10:45  Participation Level: Active  Behavioral Response: Appropriate  Type of Therapy:  Group Therapy  Summary of Progress: Pt. Continues to present as resistant to group process and indicates that the group themes do not connect with her. Pt. Responded to the counselor's questions and shared about her interests in her dogs and missing her two children who live in [place]. Pt. Also discussed her diagnosis with systemic lupus, but does not connect with the loss of her health or loss of her identity as a healthy and professionally successful person.      Nancie Neas, LPC

## 2016-10-02 NOTE — Progress Notes (Signed)
    Daily Group Progress Note  Program: IOP Group Time: 9:00-12:00   Participation Level:minimal   Behavioral Response:  flat   Type of Therapy: group therapy   Summary of Progress:  Pt was largely silent during group. Often, she sat with her body turned away and refused to speak or engage with the other group members.    Nancie Neas, LPC

## 2016-10-03 ENCOUNTER — Other Ambulatory Visit (HOSPITAL_COMMUNITY): Payer: Medicare Other | Attending: Psychiatry | Admitting: Psychiatry

## 2016-10-03 DIAGNOSIS — F332 Major depressive disorder, recurrent severe without psychotic features: Secondary | ICD-10-CM | POA: Diagnosis not present

## 2016-10-03 DIAGNOSIS — R45851 Suicidal ideations: Secondary | ICD-10-CM | POA: Diagnosis not present

## 2016-10-03 NOTE — Progress Notes (Signed)
    Daily Group Progress Note  Program: IOP  Group Time: [id number]  Participation Level: Minimal  Behavioral Response: Passive-Aggressive  Type of Therapy:  Group Therapy  Summary of Progress: Each pt was given paint swatches to choose as to how there were feeling today.  Lauren Chung chose "Perfect plum:  States she was looking for even a darker color.  I just feel very down today."  Although pt states she's feeling down; her affect was actually brighter today. [name], RN finished the group up by talking about [facility].  She discussed how each pt has a choice within them to choose their recovery route.     Group Time: 1100-1200  Participation Level:  Minimal  Behavioral Response: Passive-Aggressive and Attention-Seeking  Type of Therapy: Psycho-education Group  Summary of Progress: [name] from [facility] provided pts with resource information and answered questions.  [name], RITA, M.Ed, CNA

## 2016-10-06 ENCOUNTER — Other Ambulatory Visit (HOSPITAL_COMMUNITY): Payer: Medicare Other | Admitting: Psychiatry

## 2016-10-06 DIAGNOSIS — F332 Major depressive disorder, recurrent severe without psychotic features: Secondary | ICD-10-CM | POA: Diagnosis not present

## 2016-10-06 DIAGNOSIS — R45851 Suicidal ideations: Secondary | ICD-10-CM | POA: Diagnosis not present

## 2016-10-06 DIAGNOSIS — Z86711 Personal history of pulmonary embolism: Secondary | ICD-10-CM | POA: Diagnosis not present

## 2016-10-06 DIAGNOSIS — Z7901 Long term (current) use of anticoagulants: Secondary | ICD-10-CM | POA: Diagnosis not present

## 2016-10-06 NOTE — Progress Notes (Signed)
    Daily Group Progress Note  Program: IOP  Group Time: 9:00-12:00   Participation Level: minimal   Behavioral Response:  flat   Type of Therapy:  group therapy   Summary of Progress:  Pt was largely silent during group. However, later in the group the counselor asked her what she would like to get out of group. Pt replied that she does not want to go to in patient, so she has to go to group. Then the group members began asking her about her relationship with her kids and how that is going. Pt replied she doesn't want to see them and just wants to be left alone. The group members pointed out this contradiction. However, the pt didn't acknowledge it. When asked what she wants pt responded she doesn't know what she wants or feels.  Nancie Neas, LPC

## 2016-10-06 NOTE — Progress Notes (Addendum)
Daily Group Progress Note     Program: IOP   Group Time: 11:00-12:00pm  Participation Level: Did not participate  Behavioral Response: Minimal response  Type of Therapy:  Psychoeducation/Therapy  Summary of Progress: Pt did not participate in an artistic activity. She was in with the case manager and rejoined the group at the end of the group time.    Jenkins Rouge, LCAS   Daily Group Progress Note  Program: IOP  Group Time: 9:00-11:00  Participation Level: Minimal  Behavioral Response: Resistant  Type of Therapy:  Group Therapy  Summary of Progress: Pt. Presented as anxious, quiet, resistant to group process. Pt. Discussed with Counselor's probing relationship with her daughter who lives in town. Pt. Reported that she sees her daughter approximately once a month and does not discuss her depression with her daughter. Pt. Discussed her belief that she is weak because of her inability to pull herself out of her depression and her belief that her depression is a "demon" that she has not been able to conquer. Pt. Reported that she has not eaten in three days and has no appetite. Pt. Was able to identify that she might be able to eat some cheese and crackers today, but was not able to commit to eating today. Counselor met with Pt. Briefly during break with case manager and Pt. Indicated that she had no appetite eat due to loss of desire to live. Pt. Discussed excessive house cleaning that she engages in daily, daily bathing, and that she takes coumadin daily because her bloodwork is checked regularly due to lupus and history of heart problems.      Nancie Neas, LPC

## 2016-10-07 ENCOUNTER — Encounter (HOSPITAL_COMMUNITY): Payer: Self-pay | Admitting: Psychiatry

## 2016-10-07 ENCOUNTER — Other Ambulatory Visit (HOSPITAL_COMMUNITY): Payer: Medicare Other | Admitting: Psychiatry

## 2016-10-07 DIAGNOSIS — F332 Major depressive disorder, recurrent severe without psychotic features: Secondary | ICD-10-CM

## 2016-10-07 DIAGNOSIS — R45851 Suicidal ideations: Secondary | ICD-10-CM | POA: Diagnosis not present

## 2016-10-07 NOTE — Progress Notes (Signed)
    Daily Group Progress Note  Program: IOP  Group Time: 9:00-12:00  Participation Level: Minimal  Behavioral Response: Resistant  Type of Therapy:  Group Therapy/Psychoeducation  Summary of Progress: Pt. Continues to present as resistant to group process, flat affect, and does not participate, poor insight. Pt. Shared with the group that she did not eat yesterday, her bloodwork was not good yesterday and her doctor is concerned about her medication. Pt. Expressed concern that she is about to have a lupus crisis and is worried about a possible medication change that is very hard on her body. Pt. Was passive during discussion about identifying things that she is able to control. Pt. Was able to identify with direction from the counselor that she is able to control what she wears on a daily basis, her nutrition, and taking her medication as prescribed.     Nancie Neas, LPC

## 2016-10-07 NOTE — Progress Notes (Signed)
Ms Smedley remains severely depressed.  She wanted to try medication but with the Coumadin any antidepressant would be problematic. ECT was recommended and she agreed to go for an assessment on 15 Dec.  She gives no room to be helped otherwise, seeing the help we give as of no use and remains relentlessly suicidal.  Inpatient would be of no help as she denies any active suicidal plans or intent and would refuse to agree to admission.

## 2016-10-08 ENCOUNTER — Other Ambulatory Visit (HOSPITAL_COMMUNITY): Payer: Medicare Other | Admitting: Psychiatry

## 2016-10-08 DIAGNOSIS — F332 Major depressive disorder, recurrent severe without psychotic features: Secondary | ICD-10-CM | POA: Diagnosis not present

## 2016-10-08 DIAGNOSIS — R45851 Suicidal ideations: Secondary | ICD-10-CM | POA: Diagnosis not present

## 2016-10-09 ENCOUNTER — Other Ambulatory Visit (HOSPITAL_COMMUNITY): Payer: Medicare Other | Admitting: Psychiatry

## 2016-10-09 DIAGNOSIS — F332 Major depressive disorder, recurrent severe without psychotic features: Secondary | ICD-10-CM

## 2016-10-09 DIAGNOSIS — R45851 Suicidal ideations: Secondary | ICD-10-CM | POA: Diagnosis not present

## 2016-10-09 NOTE — Progress Notes (Signed)
    Daily Group Progress Note  Program: IOP  Group Time: 9:00-12:00  Participation Level: Minimal  Behavioral Response: Resistant  Type of Therapy:  Group Therapy  Summary of Progress: Pt. Continues to be resistant to the group process and does not talk in group. Pt was attentive during discussion about sleep hygiene habits.      Nancie Neas, LPC

## 2016-10-09 NOTE — Progress Notes (Signed)
    Daily Group Progress Note  Program: IOP   Group Time: 9:00-12:00  Participation Level: Minimal  Behavioral Response: Resistant  Type of Therapy:  Group Therapy  Summary of Progress: Pt. Was quiet, disengaged in the group process. The counselor prompted her to participate in the group discussion and she stated that she did not want to talk.     Nancie Neas, LPC

## 2016-10-10 ENCOUNTER — Other Ambulatory Visit (HOSPITAL_COMMUNITY): Payer: Medicare Other | Admitting: Psychiatry

## 2016-10-10 DIAGNOSIS — F332 Major depressive disorder, recurrent severe without psychotic features: Secondary | ICD-10-CM | POA: Diagnosis not present

## 2016-10-10 DIAGNOSIS — R45851 Suicidal ideations: Secondary | ICD-10-CM | POA: Diagnosis not present

## 2016-10-13 ENCOUNTER — Other Ambulatory Visit (HOSPITAL_COMMUNITY): Payer: Medicare Other

## 2016-10-13 NOTE — Progress Notes (Signed)
    Daily Group Progress Note  Program: IOP  Daily Group Progress Note   Program: IOP   Group Time: 9:00-12:00 (10:30am-11:30am Therapy with the Chaplain-Bob Hamilton)    Participation Level:  active   Behavioral Response:  responsive  Type of Therapy:   group therapy    Summary of Progress:  Patient reports feeling anxious today. Shared that she has a diagnosis of anxiety and depression. She was reflecting today on past events that were not pleasant for her. She shared that she and her spouse have been in a 4 yr "divorce battle".  She was open to sharing the process of divorce with the other group members. She provided encouragement and support to others about how she moved forward after divorce. Patient mentioned that her daughter is very supportive. She resonates to her family for self-healing. She appeared anxious but motivated as she shared that she feels worthy and hopeful about life despite negative past events with her marriage. She looks forward to utilizing her support system (daughter), for self-healing.   Waldon Merl, MS, LCAS-A Nancie Neas, [facility]

## 2016-10-14 ENCOUNTER — Other Ambulatory Visit (HOSPITAL_COMMUNITY): Payer: Medicare Other | Admitting: Psychiatry

## 2016-10-14 DIAGNOSIS — F332 Major depressive disorder, recurrent severe without psychotic features: Secondary | ICD-10-CM

## 2016-10-14 DIAGNOSIS — R45851 Suicidal ideations: Secondary | ICD-10-CM | POA: Diagnosis not present

## 2016-10-14 NOTE — Progress Notes (Signed)
    Daily Group Progress Note  Program: IOP  Group Time: 9:00-12:00  Participation Level: Minimal  Behavioral Response: Appropriate and Resistant  Type of Therapy:  Group Therapy  Summary of Progress: Pt. Was escorted to group with case manager, appeared to be weak and with poor balance. Pt. Turned her side to the group and did not participated in group process, made no eye contact with the group. Pt. Was silent during reflective reading about healing after loss.     Nancie Neas, LPC

## 2016-10-14 NOTE — Progress Notes (Signed)
Lauren Chung is a 50 y.o. female, pt arrived this a.m with c/o feeling dizzy.  A:  B/P 120/96.  Encouraged pt to not stand quickly and hold on to walls when walking.  Pt did drive herself to [facility] today, so writer encouraged her to call her daughter to pick her up, if still feeling dizzy before leaving today.  Informed Dr. Lovena Le.        [name], RITA, M.Ed, CNA

## 2016-10-15 ENCOUNTER — Other Ambulatory Visit (HOSPITAL_COMMUNITY): Payer: Medicare Other | Admitting: Psychiatry

## 2016-10-15 DIAGNOSIS — R413 Other amnesia: Secondary | ICD-10-CM | POA: Diagnosis not present

## 2016-10-15 DIAGNOSIS — N921 Excessive and frequent menstruation with irregular cycle: Secondary | ICD-10-CM | POA: Diagnosis not present

## 2016-10-15 DIAGNOSIS — F418 Other specified anxiety disorders: Secondary | ICD-10-CM | POA: Diagnosis not present

## 2016-10-15 DIAGNOSIS — I10 Essential (primary) hypertension: Secondary | ICD-10-CM | POA: Diagnosis not present

## 2016-10-15 DIAGNOSIS — Z7901 Long term (current) use of anticoagulants: Secondary | ICD-10-CM | POA: Diagnosis not present

## 2016-10-15 DIAGNOSIS — M797 Fibromyalgia: Secondary | ICD-10-CM | POA: Diagnosis not present

## 2016-10-15 DIAGNOSIS — M329 Systemic lupus erythematosus, unspecified: Secondary | ICD-10-CM | POA: Diagnosis not present

## 2016-10-15 DIAGNOSIS — G43909 Migraine, unspecified, not intractable, without status migrainosus: Secondary | ICD-10-CM | POA: Diagnosis not present

## 2016-10-15 DIAGNOSIS — Z86711 Personal history of pulmonary embolism: Secondary | ICD-10-CM | POA: Diagnosis not present

## 2016-10-15 NOTE — Progress Notes (Signed)
Patient ID: Lauren Chung, female   DOB: 01/04/1966, 50 y.o.   MRN: [id number] I talked to [name]'s provoder Eloise Levels, NP about starting an antidepressant.  Decided once again not to start one as she is very very poor at taking medications consistently and missing doses of any antidepressant that is somewhat safe with her Coumadin would be dangerous and could lead to more suicidal potential.  We are trying to get the [place] arranged closer to her discharge as [place] has no side effects and either works or does not.   She remains hard to reach giving very little that we can attach to to help her.  No changes according to her though she looks better to [place] on the outside.

## 2016-10-16 ENCOUNTER — Other Ambulatory Visit (HOSPITAL_COMMUNITY): Payer: Medicare Other

## 2016-10-17 ENCOUNTER — Ambulatory Visit (HOSPITAL_COMMUNITY): Payer: BLUE CROSS/BLUE SHIELD | Admitting: Psychiatry

## 2016-10-17 ENCOUNTER — Institutional Professional Consult (permissible substitution): Payer: Self-pay | Admitting: Psychiatry

## 2016-10-20 ENCOUNTER — Other Ambulatory Visit (HOSPITAL_COMMUNITY): Payer: Medicare Other

## 2016-10-20 NOTE — Progress Notes (Addendum)
Lauren Chung is a 50 y.o.  divorced female, who was referred per Dr. Delma Chung; treatment for worsening depressive and anxiety symptoms.  Admits to passive SI.  Denies a plan or intent.  Denies HI or A/V hallucinations.  Reports that she has been depressed all her life, but symptoms started to worsen four yrs ago.  Triggers:  1)  Unresloved/Grief Loss Issues:  Medical Issues:  Pt has Lupus.  Was diagnosed with Transient Ischemic Lupus in her early 47's.  States the Lupus worsened 2-3 yrs ago.  Was in the hospital for eight months back then.  "It hasn't gotten that bad anymore that I had to be hospitalized for that length of time."  Pt states last yr she became so depressed and wanted to give up to the point that she stopped taking all her Lupus medications in hope that she would die.  "I was just so very tired of the illness and I didn't want to go on."  Pt denies feeling that way at this time.  States she was recently started back on all her medications.  2)  Strained finances:  Although all her medications and doctor's visits are 100% covered; she only receives $800 from disability to live off of for the month.  States her ex-husband has agreed to keep her on his insurance plan.  3)  Limited support system;  Pt states she has no support system; although she mentioned daughter who lives in town is very supportive.  "It's just me and my dogs.  I have no one."  Pt did mention that she wishes her two youngest kids lived near her.  Pt admits to one previous psych admit [facility]) last yr d/t depression with SI.  Pt has only been seeing Dr. Delma Chung for 2 1/2 months for counseling.  Dr. Magdalene Chung is requesting pt to be referred to another clinician due to her unavailability.  Pt denies any prior suicide attempts or gestures.  [place] Hx:  Mother (hx of drug abuse and gambling).  Pt is requesting discharge.  "Nothing is helping me.  I will just isolate myself in my room where I am safe."  Reiterated to pt the  progress she is making (ie. participating a little more in the groups).  Encouraged pt to continue to attend [facility].  Pt declined.  A:  D/C pt due to non-compliancy with attendance.  Strongly recommend DBT group.  F/U with Lauren Acton, LCSW on 10-30-16 @ 7 a.m and Dr. Casimiro Needle on 11-12-16 @ 4:30 pm.  Encouraged support groups or Lauren Chung groups.                                                                                 Lauren Chung, Lauren Chung, M.Ed, CNA

## 2016-10-21 ENCOUNTER — Other Ambulatory Visit (HOSPITAL_COMMUNITY): Payer: Medicare Other

## 2016-10-21 NOTE — Progress Notes (Signed)
[facility] DISCHARGE NOTE  Patient:  Lauren Chung DOB:  1966-03-29  Date of Admission:   Date of Discharge: 10/20/2016  Reason for Admission:depression  IOP Course:[name] attended but participation was minimal.  She was able to offer good advice to others at times but made it clear that she could not be helped.  Switching antidepressants or augmenting with Wellbutrin was problematic because of the Coumadin and her erratic taking of medication.  ECT was out because she had no one to take her to the sessions.  [name] was recommended and we will see if she follows up on that.  She was reluctant to come to the program initially as she did not think it would help.  She did come which was a positive thing but it did no seem to help and she did not seem to make an effort otherwise to change believing it would do no good apparently.  Mental Status at [place] depressed and having suicidal thoughts but no intent  Diagnosis:severe major depression, recurrent without psychotic features   Level of Care:  IOP  Discharge destination:has appointments for therapy and medication management if she chooses to keep them      Comments:  [name] did not get any appreciable help from this program and remains a suicidal risk.  Because she is not actively suicidal she is not commitable and is clear that she does not want inpatient and knows what to say to not be committed against her will.  The patient received suicide prevention pamphlet:  Yes   Donnelly Angelica, MD 12

## 2016-10-22 ENCOUNTER — Other Ambulatory Visit (HOSPITAL_COMMUNITY): Payer: Medicare Other

## 2016-10-23 ENCOUNTER — Other Ambulatory Visit (HOSPITAL_COMMUNITY): Payer: Medicare Other

## 2016-10-23 DIAGNOSIS — Z86711 Personal history of pulmonary embolism: Secondary | ICD-10-CM | POA: Diagnosis not present

## 2016-10-23 DIAGNOSIS — Z7901 Long term (current) use of anticoagulants: Secondary | ICD-10-CM | POA: Diagnosis not present

## 2016-10-24 ENCOUNTER — Other Ambulatory Visit (HOSPITAL_COMMUNITY): Payer: Medicare Other

## 2016-10-28 ENCOUNTER — Other Ambulatory Visit (HOSPITAL_COMMUNITY): Payer: Medicare Other

## 2016-10-29 ENCOUNTER — Ambulatory Visit: Payer: BLUE CROSS/BLUE SHIELD | Admitting: Neurology

## 2016-10-29 ENCOUNTER — Other Ambulatory Visit (HOSPITAL_COMMUNITY): Payer: Medicare Other

## 2016-10-30 ENCOUNTER — Encounter (HOSPITAL_COMMUNITY): Payer: Self-pay | Admitting: Clinical

## 2016-10-30 ENCOUNTER — Other Ambulatory Visit (HOSPITAL_COMMUNITY): Payer: Medicare Other

## 2016-10-30 ENCOUNTER — Ambulatory Visit (INDEPENDENT_AMBULATORY_CARE_PROVIDER_SITE_OTHER): Payer: PRIVATE HEALTH INSURANCE | Admitting: Clinical

## 2016-10-30 DIAGNOSIS — F4310 Post-traumatic stress disorder, unspecified: Secondary | ICD-10-CM

## 2016-10-30 DIAGNOSIS — F410 Panic disorder [episodic paroxysmal anxiety] without agoraphobia: Secondary | ICD-10-CM | POA: Diagnosis not present

## 2016-10-30 DIAGNOSIS — Z86711 Personal history of pulmonary embolism: Secondary | ICD-10-CM | POA: Diagnosis not present

## 2016-10-30 DIAGNOSIS — F422 Mixed obsessional thoughts and acts: Secondary | ICD-10-CM

## 2016-10-30 DIAGNOSIS — F332 Major depressive disorder, recurrent severe without psychotic features: Secondary | ICD-10-CM | POA: Diagnosis not present

## 2016-10-30 DIAGNOSIS — Z7901 Long term (current) use of anticoagulants: Secondary | ICD-10-CM | POA: Diagnosis not present
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
## 2016-10-31 ENCOUNTER — Other Ambulatory Visit (HOSPITAL_COMMUNITY): Payer: Medicare Other

## 2016-11-04 ENCOUNTER — Other Ambulatory Visit (HOSPITAL_COMMUNITY): Payer: Medicare Other

## 2016-11-05 ENCOUNTER — Other Ambulatory Visit (HOSPITAL_COMMUNITY): Payer: Medicare Other

## 2016-11-06 ENCOUNTER — Other Ambulatory Visit (HOSPITAL_COMMUNITY): Payer: Medicare Other

## 2016-11-07 ENCOUNTER — Other Ambulatory Visit (HOSPITAL_COMMUNITY): Payer: Medicare Other

## 2016-11-10 ENCOUNTER — Other Ambulatory Visit (HOSPITAL_COMMUNITY): Payer: Medicare Other

## 2016-11-11 ENCOUNTER — Other Ambulatory Visit (HOSPITAL_COMMUNITY): Payer: Medicare Other

## 2016-11-12 ENCOUNTER — Ambulatory Visit (INDEPENDENT_AMBULATORY_CARE_PROVIDER_SITE_OTHER): Payer: Medicare Other | Admitting: Psychiatry

## 2016-11-12 ENCOUNTER — Other Ambulatory Visit (HOSPITAL_COMMUNITY): Payer: Medicare Other

## 2016-11-12 ENCOUNTER — Encounter (HOSPITAL_COMMUNITY): Payer: Self-pay | Admitting: Psychiatry

## 2016-11-12 VITALS — BP 140/82 | HR 101 | Ht 68.0 in | Wt 159.2 lb

## 2016-11-12 DIAGNOSIS — Z806 Family history of leukemia: Secondary | ICD-10-CM | POA: Diagnosis not present

## 2016-11-12 DIAGNOSIS — F339 Major depressive disorder, recurrent, unspecified: Secondary | ICD-10-CM | POA: Diagnosis not present

## 2016-11-12 DIAGNOSIS — Z79899 Other long term (current) drug therapy: Secondary | ICD-10-CM

## 2016-11-12 DIAGNOSIS — Z811 Family history of alcohol abuse and dependence: Secondary | ICD-10-CM

## 2016-11-12 DIAGNOSIS — Z888 Allergy status to other drugs, medicaments and biological substances status: Secondary | ICD-10-CM

## 2016-11-12 DIAGNOSIS — Z813 Family history of other psychoactive substance abuse and dependence: Secondary | ICD-10-CM | POA: Diagnosis not present

## 2016-11-12 MED ORDER — BUPROPION HCL ER (XL) 150 MG PO TB24: ORAL_TABLET | ORAL | 2 refills | Status: DC

## 2016-11-12 NOTE — Progress Notes (Signed)
Psychiatric Initial Adult Assessment   Patient Identification: Lauren Chung MRN:  [id number] Date of Evaluation:  11/12/2016 Referral Source Delma Officer Chief Complaint:   Visit Diagnosis: No diagnosis found.  History of Present Illness:  This patient is a 51 year old divorced female mother is been living in [place] for the last one year. She comes from [place]. She has an older daughter who is 80 who lives in the city and has 3 children herself. The patient has 3 children the other 2 live in [place]. The patient's divorce will be final in about one week. She's been separated for 5 years. The patient is on disability for lupus. She's had some renal involvement. The patient presently is being evaluated in the setting for [place]. The patient acknowledges persistent daily depression that seems to be chronic. She says is been present for well over 10 years. The patient is been psychiatrically hospitalized years ago for suicidal ideation. The patient actually is made suicide attempts in the past and actually made an attempt a month ago. At this time the patient denies being acutely suicidal. She is actively interested in treatment. She sleeps well as long she takes Ambien. Her appetite is portion is lost 20 pounds. Her energy is good her concentration is stable but she does describe a sense of worthlessness. She denies the use of alcohol or drugs. She denies any psychotic symptoms. She describes a chronic anxious state possibly consistent with generalized anxiety disorder. The patient admits that she's been very noncompliant. It should be noted that is a potential interaction or complication in individual who takes Coumadin. I would in fact avoid any serotonin drugs. Note is the patient's had a pulmonary embolus in the past, related to lupus. In today's interview was acknowledge that this is a shortened interview. In order for her to get into this office this is a shortened 30 minute visit and therefore I  will see her back in approximately one month. The patient does acknowledge that she feels relatively safe in that she has an appointment in 2 weeks with her therapist Tharon Aquas and that we are having constant contact with her the process of getting her into the [facility] program.  Associated Signs/Symptoms: Depression Symptoms:  depressed mood, (Hypo) Manic Symptoms:   Anxiety Symptoms:   Psychotic Symptoms:   PTSD Symptoms:   Past Psychiatric History: Past therapy, one psychiatric hospitalization  Previous Psychotropic Medications: Yes   Substance Abuse History in the last 12 months:  No.  Consequences of Substance Abuse: Negative  Past Medical History:  Past Medical History:  Diagnosis Date  . Anxiety   . Depression   . Lupus   . PE (pulmonary embolism)   . PTSD (post-traumatic stress disorder)   . Stroke ([place])   . TIA (transient ischemic attack)     Past Surgical History:  Procedure Laterality Date  . ABDOMINAL SURGERY    . ABLATION      Family Psychiatric History:   Family History:  Family History  Problem Relation Age of Onset  . Leukemia Father   . Seizures Daughter   . Drug abuse Mother   . Alcohol abuse Mother     Social History:   Social History   Social History  . Marital status: Legally Separated    Spouse name: N/A  . Number of children: N/A  . Years of education: N/A   Social History Main Topics  . Smoking status: Never Smoker  . Smokeless tobacco: Never Used  . Alcohol  use No  . Drug use: No  . Sexual activity: Not Currently   Other Topics Concern  . None   Social History Narrative  . None    Additional Social History:   Allergies:   Allergies  Allergen Reactions  . Codeine Shortness Of Breath  . Contrast Media [Iodinated Diagnostic Agents] Shortness Of Breath  . Tylenol [Acetaminophen] Shortness Of Breath    Metabolic Disorder Labs: No results found for: HGBA1C, MPG No results found for: PROLACTIN No results found for: CHOL,  TRIG, HDL, CHOLHDL, VLDL, LDLCALC   Current Medications: Current Outpatient Prescriptions  Medication Sig Dispense Refill  . alprazolam (XANAX) 2 MG tablet Take 2 mg by mouth at bedtime as needed for sleep.    . DULoxetine (CYMBALTA) 20 MG capsule TK 1 C PO BID  0  . hydroxychloroquine (PLAQUENIL) 200 MG tablet TK 2 TS PO QD WF OR MILK  3  . warfarin (COUMADIN) 5 MG tablet Take 1 tablet (5 mg total) by mouth daily at 6 PM. Adjust dose based on INR with primary care physician in 5 days 30 tablet 0  . zolpidem (AMBIEN CR) 12.5 MG CR tablet TK 1 T PO QD HS  1  . buPROPion (WELLBUTRIN XL) 150 MG 24 hr tablet 1  qam  For 1 week the 2  qam 60 tablet 2  . nortriptyline (PAMELOR) 25 MG capsule Take 1 capsule (25 mg total) by mouth at bedtime. (Patient not taking: Reported on 11/12/2016) 30 capsule 3  . triamterene-hydrochlorothiazide (MAXZIDE-25) 37.5-25 MG tablet Take 1 tablet by mouth daily.  0   No current facility-administered medications for this visit.     Neurologic: Headache: No Seizure: No Paresthesias:No  Musculoskeletal: Strength & Muscle Tone: within normal limits Gait & Station: normal Patient leans: N/A  Psychiatric Specialty Exam: ROS  Blood pressure 140/82, pulse (!) 101, height 5\' 8"  (1.727 m), weight 159 lb 3.2 oz (72.2 kg), last menstrual period 10/06/2016.Body mass index is 24.21 kg/m.  General Appearance: Casual  Eye Contact:  Good  Speech:  Clear and Coherent  Volume:  Normal  Mood:  Dysphoric  Affect:  Appropriate  Thought Process:  Goal Directed  Orientation:  NA  Thought Content:  Logical  Suicidal Thoughts:  No  Homicidal Thoughts:  No  Memory:  NA  Judgement:  Good  Insight:  Good  Psychomotor Activity:  Normal  Concentration:    Recall:  [place] of Knowledge:Fair  Language: Good  Akathisia:  No  Handed:  Right  AIMS (if indicated):    Assets:  Desire for Improvement  ADL's:  Intact  Cognition: WNL  Sleep:      Treatment Plan  Summary:  This was an abbreviated evaluation. This is essentially an evaluation to be sure this patient is not suicidal. At this time she denies being acutely suicidal. She is looking for to being evaluated for the [place] treatment and is agreeable to return to see me in approximately one month. She'll continue in therapy in the setting. Today we talked about the importance of compliance with medications. Today we went ahead and started her on Wellbutrin 150 XL taking one each morning in one week she'll increase it to 300 mg. Wellbutrin is a very low side effect profile. It is safe to take with her Coumadin. Patient is never had a seizure before. The patient is agreeable to take this medication to continue in therapy and return to see me in 5 weeks.  Haskel Schroeder, MD 1/10/20185:11 PM

## 2016-11-13 ENCOUNTER — Other Ambulatory Visit (HOSPITAL_COMMUNITY): Payer: Medicare Other

## 2016-11-14 ENCOUNTER — Other Ambulatory Visit (HOSPITAL_COMMUNITY): Payer: Medicare Other

## 2016-11-17 ENCOUNTER — Other Ambulatory Visit (HOSPITAL_COMMUNITY): Payer: Medicare Other

## 2016-11-18 ENCOUNTER — Other Ambulatory Visit (HOSPITAL_COMMUNITY): Payer: Medicare Other

## 2016-11-19 ENCOUNTER — Other Ambulatory Visit (HOSPITAL_COMMUNITY): Payer: Medicare Other

## 2016-11-20 ENCOUNTER — Other Ambulatory Visit (HOSPITAL_COMMUNITY): Payer: Medicare Other

## 2016-11-21 ENCOUNTER — Other Ambulatory Visit (HOSPITAL_COMMUNITY): Payer: Medicare Other

## 2016-11-24 ENCOUNTER — Encounter (HOSPITAL_COMMUNITY): Payer: Self-pay | Admitting: Clinical

## 2016-11-24 ENCOUNTER — Encounter (HOSPITAL_COMMUNITY): Payer: Self-pay | Admitting: Emergency Medicine

## 2016-11-24 ENCOUNTER — Emergency Department (HOSPITAL_COMMUNITY)
Admission: EM | Admit: 2016-11-24 | Discharge: 2016-11-24 | Disposition: A | Discharge status: Home / Self Care | Payer: BLUE CROSS/BLUE SHIELD | Attending: Emergency Medicine | Admitting: Emergency Medicine

## 2016-11-24 ENCOUNTER — Emergency Department (HOSPITAL_COMMUNITY): Payer: BLUE CROSS/BLUE SHIELD

## 2016-11-24 ENCOUNTER — Ambulatory Visit (INDEPENDENT_AMBULATORY_CARE_PROVIDER_SITE_OTHER): Payer: Medicare Other | Admitting: Clinical

## 2016-11-24 ENCOUNTER — Telehealth (HOSPITAL_COMMUNITY): Payer: Self-pay

## 2016-11-24 DIAGNOSIS — R51 Headache: Secondary | ICD-10-CM | POA: Diagnosis present

## 2016-11-24 DIAGNOSIS — F332 Major depressive disorder, recurrent severe without psychotic features: Secondary | ICD-10-CM

## 2016-11-24 DIAGNOSIS — Z7901 Long term (current) use of anticoagulants: Secondary | ICD-10-CM | POA: Diagnosis not present

## 2016-11-24 DIAGNOSIS — F4310 Post-traumatic stress disorder, unspecified: Secondary | ICD-10-CM

## 2016-11-24 DIAGNOSIS — F422 Mixed obsessional thoughts and acts: Secondary | ICD-10-CM

## 2016-11-24 DIAGNOSIS — F410 Panic disorder [episodic paroxysmal anxiety] without agoraphobia: Secondary | ICD-10-CM

## 2016-11-24 DIAGNOSIS — G4489 Other headache syndrome: Secondary | ICD-10-CM | POA: Diagnosis not present

## 2016-11-24 DIAGNOSIS — Z8673 Personal history of transient ischemic attack (TIA), and cerebral infarction without residual deficits: Secondary | ICD-10-CM | POA: Insufficient documentation

## 2016-11-24 LAB — CBC
HCT: 36.5 % (ref 36.0–46.0)
Hemoglobin: 11.7 g/dL — ABNORMAL LOW (ref 12.0–15.0)
MCH: 24.7 pg — ABNORMAL LOW (ref 26.0–34.0)
MCHC: 32.1 g/dL (ref 30.0–36.0)
MCV: 77 fL — AB (ref 78.0–100.0)
PLATELETS: 349 10*3/uL (ref 150–400)
RBC: 4.74 MIL/uL (ref 3.87–5.11)
RDW: 17.6 % — AB (ref 11.5–15.5)
WBC: 10 10*3/uL (ref 4.0–10.5)

## 2016-11-24 LAB — COMPREHENSIVE METABOLIC PANEL
ALT: 16 U/L (ref 14–54)
ANION GAP: 10 (ref 5–15)
AST: 25 U/L (ref 15–41)
Albumin: 4.7 g/dL (ref 3.5–5.0)
Alkaline Phosphatase: 69 U/L (ref 38–126)
BUN: 10 mg/dL (ref 6–20)
CHLORIDE: 104 mmol/L (ref 101–111)
CO2: 25 mmol/L (ref 22–32)
CREATININE: 0.89 mg/dL (ref 0.44–1.00)
Calcium: 9.1 mg/dL (ref 8.9–10.3)
GFR calc Af Amer: >60 mL/min (ref >60)
GFR calc non Af Amer: >60 mL/min (ref >60)
Glucose, Bld: 86 mg/dL (ref 65–99)
Potassium: 3.6 mmol/L (ref 3.5–5.1)
SODIUM: 139 mmol/L (ref 135–145)
Total Bilirubin: 0.6 mg/dL (ref 0.3–1.2)
Total Protein: 8.1 g/dL (ref 6.5–8.1)

## 2016-11-24 LAB — PROTIME-INR
INR: 1.89
PROTHROMBIN TIME: 22 s — AB (ref 11.4–15.2)

## 2016-11-24 LAB — DIFFERENTIAL
BASOS PCT: 1 %
Basophils Absolute: 0.1 10*3/uL (ref 0.0–0.1)
Eosinophils Absolute: 0.3 10*3/uL (ref 0.0–0.7)
Eosinophils Relative: 3 %
Lymphocytes Relative: 24 %
Lymphs Abs: 2.4 10*3/uL (ref 0.7–4.0)
MONO ABS: 0.6 10*3/uL (ref 0.1–1.0)
Monocytes Relative: 6 %
NEUTROS ABS: 6.6 10*3/uL (ref 1.7–7.7)
NEUTROS PCT: 66 %

## 2016-11-24 LAB — I-STAT CHEM 8, ED
BUN: 9 mg/dL (ref 6–20)
CALCIUM ION: 1.12 mmol/L — AB (ref 1.15–1.40)
Chloride: 102 mmol/L (ref 101–111)
Creatinine, Ser: 1 mg/dL (ref 0.44–1.00)
Glucose, Bld: 86 mg/dL (ref 65–99)
HCT: 38 % (ref 36.0–46.0)
Hemoglobin: 12.9 g/dL (ref 12.0–15.0)
Potassium: 4.1 mmol/L (ref 3.5–5.1)
SODIUM: 140 mmol/L (ref 135–145)
TCO2: 30 mmol/L (ref 0–100)

## 2016-11-24 LAB — I-STAT TROPONIN, ED: Troponin i, poc: 0 ng/mL (ref 0.00–0.08)

## 2016-11-24 LAB — CBG MONITORING, ED: GLUCOSE-CAPILLARY: 82 mg/dL (ref 65–99)

## 2016-11-24 LAB — APTT: APTT: 37 s — AB (ref 24–36)

## 2016-11-24 NOTE — ED Notes (Signed)
Pt has a note from her primary doctor on her table She states that her symptoms started on Saturday

## 2016-11-24 NOTE — ED Provider Notes (Signed)
[facility] DEPT Provider Note   CSN: [id number] Arrival date & time: 11/24/16  1734     History   Chief Complaint Chief Complaint  Patient presents with  . Numbness  . Headache    HPI Lauren Chung is a 51 y.o. female.  She presents for evaluation of intermittent, sharp pain in the right side of her head which has been present for 3 days. The pain is fleeting, lasting only a few seconds at a time. It is associated with dizziness, numbness in her tongue, and difficulty moving her right hand. She's had similar symptoms in the past, with the exception of the head discomfort. Previously she was evaluated by neurology for the symptoms, in September 2017. Today she was visiting her therapist, regarding anxiety and depression, when it was noted that she had headache. Therefore, she was sent here by private vehicle for evaluation. She drove her own car here. She denies fever, chills, nausea, vomiting, neck or back pain. She is able to walk. She has not yet tried anything for the headache. There are no other known modifying factors.   HPI  Past Medical History:  Diagnosis Date  . Anxiety   . Depression   . Lupus   . PE (pulmonary embolism)   . PTSD (post-traumatic stress disorder)   . Stroke ([place])   . TIA (transient ischemic attack)     Patient Active Problem List   Diagnosis Date Noted  . Severe recurrent major depression without psychotic features ([place]) 09/30/2016    Class: Chronic    Past Surgical History:  Procedure Laterality Date  . ABDOMINAL SURGERY    . ABLATION      OB History    No data available       Home Medications    Prior to Admission medications   Medication Sig Start Date End Date Taking? Authorizing Provider  alprazolam Duanne Moron) 2 MG tablet Take 2 mg by mouth at bedtime as needed for sleep.   Yes Historical Provider, MD  buPROPion (WELLBUTRIN XL) 150 MG 24 hr tablet 1  qam  For 1 week the 2  qam 11/12/16  Yes Norma Fredrickson, MD  DULoxetine  (CYMBALTA) 20 MG capsule TAKE 20MG  BY MOUTH TWO TIMES DAILY   Yes Historical Provider, MD  hydroxychloroquine (PLAQUENIL) 200 MG tablet TAKE 200MG  BY MOUTH TWICE A DAY WITH FOOD OR MILK 08/07/16  Yes Historical Provider, MD  warfarin (COUMADIN) 5 MG tablet Take 1 tablet (5 mg total) by mouth daily at 6 PM. Adjust dose based on INR with primary care physician in 5 days Patient taking differently: Take 2.5-5 mg by mouth daily. Take 5mg  by mouth on Monday, Wednesday, and Friday. Take 2.5mg  by mouth on Tuesday, Thursday, Saturday, and Sunday. 08/11/16  Yes Brunetta Genera, MD  zolpidem (AMBIEN CR) 12.5 MG CR tablet TAKE 12.5 MG BY MOUTH EVERY NIGHT 06/01/16  Yes Historical Provider, MD  nortriptyline (PAMELOR) 25 MG capsule Take 1 capsule (25 mg total) by mouth at bedtime. Patient not taking: Reported on 11/12/2016 07/23/16   Pieter Partridge, DO    Family History Family History  Problem Relation Age of Onset  . Leukemia Father   . Seizures Daughter   . Drug abuse Mother   . Alcohol abuse Mother     Social History Social History  Substance Use Topics  . Smoking status: Never Smoker  . Smokeless tobacco: Never Used  . Alcohol use No     Allergies   Codeine; Contrast  media [iodinated diagnostic agents]; and Tylenol [acetaminophen]   Review of Systems Review of Systems  All other systems reviewed and are negative.    Physical Exam Updated Vital Signs BP (!) 155/101   Pulse 83   Temp 98.2 F (36.8 C) (Oral)   Resp 23   Ht 5\' 8"  (1.727 m)   Wt 152 lb (68.9 kg)   SpO2 100%   BMI 23.11 kg/m   Physical Exam  Constitutional: She is oriented to person, place, and time. She appears well-developed and well-nourished.  HENT:  Head: Normocephalic and atraumatic.  Eyes: Conjunctivae and EOM are normal. Pupils are equal, round, and reactive to light.  Neck: Normal range of motion and phonation normal. Neck supple.  There is no meningismus.  Cardiovascular: Normal rate and regular  rhythm.   Pulmonary/Chest: Effort normal and breath sounds normal. She exhibits no tenderness.  Abdominal: Soft. She exhibits no distension. There is no tenderness. There is no guarding.  Musculoskeletal: Normal range of motion.  Neurological: She is alert and oriented to person, place, and time. She exhibits normal muscle tone.  No dysarthria and aphasia or nystagmus. She walks with a normal gait.  Skin: Skin is warm and dry.  Psychiatric: She has a normal mood and affect. Her behavior is normal. Judgment and thought content normal.  Nursing note and vitals reviewed.    ED Treatments / Results  Labs (all labs ordered are listed, but only abnormal results are displayed) Labs Reviewed  PROTIME-INR - Abnormal; Notable for the following:       Result Value   Prothrombin Time 22.0 (*)    All other components within normal limits  APTT - Abnormal; Notable for the following:    aPTT 37 (*)    All other components within normal limits  CBC - Abnormal; Notable for the following:    Hemoglobin 11.7 (*)    MCV 77.0 (*)    MCH 24.7 (*)    RDW 17.6 (*)    All other components within normal limits  I-STAT CHEM 8, ED - Abnormal; Notable for the following:    Calcium, Ion 1.12 (*)    All other components within normal limits  DIFFERENTIAL  COMPREHENSIVE METABOLIC PANEL  I-STAT TROPOININ, ED  CBG MONITORING, ED    EKG  EKG Interpretation  Date/Time:  Monday November 24 2016 18:29:40 EST Ventricular Rate:  96 PR Interval:    QRS Duration: 99 QT Interval:  372 QTC Calculation: 471 R Axis:   76 Text Interpretation:  Sinus rhythm Since last tracing rate slower Confirmed by Eulis Foster  MD, Thom Ollinger [phone]) on 11/24/2016 8:15:46 PM       Radiology Ct Head Wo Contrast  Result Date: 11/24/2016 CLINICAL DATA:  worsening right temporal headache and twitching of her arms. Evaluation to r/o possible bleed related to Coumadin use and/or medication reaction related to recent start/increase in  Wellbutrin. EXAM: CT HEAD WITHOUT CONTRAST TECHNIQUE: Contiguous axial images were obtained from the base of the skull through the vertex without intravenous contrast. COMPARISON:  07/21/2016 FINDINGS: Brain: The brainstem, cerebellum, cerebral peduncles, thalami, basal ganglia, basilar cisterns, and ventricular system appear within normal limits. Probable small meningioma along the right parietal vertex. No intracranial hemorrhage or acute CVA. Vascular: Unremarkable Skull: Unremarkable Sinuses/Orbits: Unremarkable Other: Several left frontal scalp lesions with calcifications, possibly sebaceous cysts or similar. IMPRESSION: 1. Suspected small calcified meningioma along the right parietal vertex, thought to be clinically inconsequential. 2. Several calcified scalp lesions on the left,  possibly chronic sebaceous cysts. 3. No acute intracranial findings. Electronically Signed   By: Van Clines M.D.   On: 11/24/2016 19:31    Procedures Procedures (including critical care time)  Medications Ordered in ED Medications - No data to display   Initial Impression / Assessment and Plan / ED Course  I have reviewed the triage vital signs and the nursing notes.  Pertinent labs & imaging results that were available during my care of the patient were reviewed by me and considered in my medical decision making (see chart for details).  Clinical Course as of Nov 24 2106  Mon Nov 24, 2016  2022 INR 1.9, somewhat low for therapeutic. Prothrombin Time: (!) 22.0 [EW]    Clinical Course User Index [EW] Daleen Bo, MD    Medications - No data to display  Patient Vitals for the past 24 hrs:  BP Temp Temp src Pulse Resp SpO2 Height Weight  11/24/16 2030 (!) 155/101 - - 83 23 100 % - -  11/24/16 1940 - - - 86 26 100 % - -  11/24/16 1938 (!) 156/108 - - (!) 44 25 (!) 72 % - -  11/24/16 1821 - - - - - - 5\' 8"  (1.727 m) 152 lb (68.9 kg)  11/24/16 1810 (!) 145/101 98.2 F (36.8 C) Oral 98 18 98 % 5\' 8"   (1.727 m) 152 lb (68.9 kg)    9:04 PM Reevaluation with update and discussion. After initial assessment and treatment, an updated evaluation reveals No change in clinical status. Findings discussed with the patient and all questions were answered. Severin Bou L    Final Clinical Impressions(s) / ED Diagnoses   Final diagnoses:  Other headache syndrome    Nonspecific neurologic symptoms with new headache. Headache is intermittent, lasts very briefly, and resolves spontaneously. Doubt CVA, intracranial bleeding, serious bacterial infection or impending vascular collapse. Possible relation to lupus. Possible relation to prior history of migraine disorder. No indication for further treatment and evaluation at this time.  Nursing Notes Reviewed/ Care Coordinated Applicable Imaging Reviewed Interpretation of Laboratory Data incorporated into ED treatment  The patient appears reasonably screened and/or stabilized for discharge and I doubt any other medical condition or other [facility] requiring further screening, evaluation, or treatment in the ED at this time prior to discharge.  Plan: Home Medications- continue usual, APAP for pain; Home Treatments- rest; return here if the recommended treatment, does not improve the symptoms; Recommended follow up- PCP prn. Neuro f/u for current SX in 1-2 weeks   New Prescriptions New Prescriptions   No medications on file     Daleen Bo, MD 11/24/16 2109

## 2016-11-24 NOTE — Discharge Instructions (Signed)
CT scan did not show any bleeding in the brain.  Try taking Tylenol if needed for pain.  Follow-up with your neurologist for further evaluation and treatment in 1 or 2 weeks.

## 2016-11-24 NOTE — ED Triage Notes (Addendum)
Pt sent by Plovsky MD for worsening right temporal headache and twitching of her arms. Evaluation to r/o possible bleed related to Coumadin use and/or medication reaction related to recent start/increase in Wellbutrin. With triage pt verbalizes 7/10 right temporal headachenumbness to tongue, and slurred speech. Pt denies weakness or numbness. Pt slight asymmetrical to right side of face with smile and slurring noted. Pt alert and oriented x4.

## 2016-11-24 NOTE — Progress Notes (Signed)
   THERAPIST PROGRESS NOTE  Session Time: 4:30 -4:40   Participation Level: Active  Behavioral Response: CasualAlertDepressed  Type of Therapy: Individual Therapy  Treatment Goals addressed: improve psychiatric symptoms  Interventions: crisis intervention  Summary: Lauren Chung is a 52 y.o. female who presents with major depressive disorder, recurrent, severe and PTSD and mixed obsessional thoughts and actions, and panic disorder.   Suicidal/Homicidal: Nowithout intent/plan  Therapist Response: Lauren Chung met with clinician for an individual session. She shared about psychiatric symptoms and her medications. She shared that she had been having sharp pains in her head and memory loss. Clinician consulted nurse who reviewed her medication. There was concern because she is on cumidine. The psychiatrist was consulted and it was suggested that she go to the ED to be checked out. Psychiatrist will adjust medication accordingly. Session was cut short and Lauren Chung agreed to go to the ED.  Plan: Return again in 1 weeks.  Diagnosis: Axis I: major depressive disorder, recurrent, severe and PTSD and mixed obsessional thoughts and actions, and panic disorder   Lauren Chung A, LCSW 11/24/2016

## 2016-11-24 NOTE — Telephone Encounter (Signed)
Medication management - Patient in to see therapist today who requested this nurse see patient for expressed medical concerns for a sharp pain on the right side of her head over the past 2 days, memory loss and feeling jittery.   Reviewed patient's current medications and recent start on Wellbutrin XL 150 and increase to 300 mg over the past week.  Patient also on Coumadin for Lupus so spoke with Darlyne Russian, PA-C and called Dr. Casimiro Needle who both agreed patient should go to the local emergency room for a full evaluation of possible bleed and to rule out medical cause for symptoms.  Instructed patient of these concerns and providers requests as she agreed to walk over to [facility] Emergency Department at this time for an evaluation as states can go on her own.  Patient agreed if medically cleared to call back on 11/25/16 as Dr. Casimiro Needle agreed to then call her back to discuss possible medication changes if warranted.  Patient left on her own with denial of any altered gait, no slurred speech or weakness reported or observed. Following note typed and given to patient to take to the [facility] ED to assist with communicating MD concerns.  Patient to call back if any other problems and stated understanding plan for immediate evaluation of symptoms at this time.  Patient left office to go to [facility] Emergency.     Letter Stated:  "Patient presented today with complaints of severe head pain approximately 1 and a half inches above her temporal area for the past 2 days.  Patient states this has gradually gotten worse and is experiencing some twitching of her arms.  States some loss of memory with words and feeling jittery.  Called and spoke with Dr. Norma Fredrickson and met with Darlyne Russian, PA-C that requested patient go to the emergency department for and evaluation due to concerns for a possible bleed as patient is on Coumadin.  Patient agreed with plan and will call back to our office on 11/25/16 if nothing  found as may be related to recent start and increase in Wellbutrin but want to rule out medical issue first.  Please assist patient with complete evaluation for her symptoms and brain scan.  Thanks, Beather Arbour, BSN, RN-BC for Dr. Norma Fredrickson. [phone]"

## 2016-11-25 NOTE — Telephone Encounter (Signed)
Medication management - Met with Dr. Casimiro Needle to discuss patient's trip to the [facility] ED on 11/24/16 to rule out any medical causes for headaches and agreed to call patient with instruction to now decrease Wellbutirn XL back to 150 mg one a day.   Left patient a message on her phone with these instructions and requested she call our office back to verify she understood to reduce dosage back to 150 mg a day.

## 2016-11-26 NOTE — Telephone Encounter (Signed)
thanks

## 2016-12-16 ENCOUNTER — Ambulatory Visit (HOSPITAL_COMMUNITY): Payer: Self-pay | Admitting: Clinical

## 2017-01-01 DIAGNOSIS — R05 Cough: Secondary | ICD-10-CM | POA: Diagnosis not present

## 2017-01-01 DIAGNOSIS — J111 Influenza due to unidentified influenza virus with other respiratory manifestations: Secondary | ICD-10-CM | POA: Diagnosis not present

## 2017-01-01 DIAGNOSIS — R0602 Shortness of breath: Secondary | ICD-10-CM | POA: Diagnosis not present

## 2017-01-07 ENCOUNTER — Ambulatory Visit (INDEPENDENT_AMBULATORY_CARE_PROVIDER_SITE_OTHER): Payer: PRIVATE HEALTH INSURANCE | Admitting: Psychiatry

## 2017-01-07 ENCOUNTER — Encounter (HOSPITAL_COMMUNITY): Payer: Self-pay | Admitting: Psychiatry

## 2017-01-07 VITALS — BP 122/70 | HR 74 | Ht 68.0 in | Wt 156.4 lb

## 2017-01-07 DIAGNOSIS — Z811 Family history of alcohol abuse and dependence: Secondary | ICD-10-CM

## 2017-01-07 DIAGNOSIS — F339 Major depressive disorder, recurrent, unspecified: Secondary | ICD-10-CM | POA: Diagnosis not present

## 2017-01-07 DIAGNOSIS — Z888 Allergy status to other drugs, medicaments and biological substances status: Secondary | ICD-10-CM | POA: Diagnosis not present

## 2017-01-07 DIAGNOSIS — Z79899 Other long term (current) drug therapy: Secondary | ICD-10-CM

## 2017-01-07 DIAGNOSIS — Z813 Family history of other psychoactive substance abuse and dependence: Secondary | ICD-10-CM | POA: Diagnosis not present

## 2017-01-07 NOTE — Progress Notes (Signed)
Psychiatric Initial Adult Assessment   Patient Identification: Lauren Chung MRN:  [id number] Date of Evaluation:  01/07/2017 Referral Source [facility] Officer Chief Complaint:   Visit Diagnosis: No diagnosis found.  History of Present Illness:  Unfortunately the full dose of Wellbutrin caused this patient side effects of a headache and other physical complaints. When she returned back to a dose of 150 mg all her side effects just about went away. Her mood is actually a little better. When asked it was thoroughly 60 or 90% better she said was 30% better. It should be noted the patient sleeps well as long she takes her Ambien. It should be noted that her appetite has been poor in the recent past and she has to make herself eat. Her energy levels okay as is her ability to concentrate. She enjoys the television her tube all. She enjoys her 3 grandchildren. She does little else for enjoyment. She still has somewhat below self-worth. The patient drinks no alcohol and uses no drugs. Her lupus at this time is stable. I believe she is on prednisone. She's got a good family relationship with 3 daughters one of whom lives here get into a [place] on them is apparently leaving [place]. The patient is very withdrawn and isolated. She has no romantic relationships and says she has very few friends and can't figure out why. She is very isolated solitary lifestyle. The patient is been diagnosed with depression by multiple providers and tried multiple antidepressants area she's been unable to take any antidepressants take any significant degree. In essence she never gets up to significant doses. This seems to be the case Wellbutrin as well. She makes a statement that there is prescriptions about getting Bent when she tries multiple antidepressants. According to her history she's tried multiple antidepressants and had side effects. She now gets only a 30% improvement on Wellbutrin. Unfortunately I cannot reduce this dose.  Patient is depression is not adequately controlled. Her sleep appetite and her ability to enjoy life is all intact by persistent daily depression state. While her mood is 30% better overall she shows only marginal improvement. Associated Signs/Symptoms: Depression Symptoms:  depressed mood, (Hypo) Manic Symptoms:   Anxiety Symptoms:   Psychotic Symptoms:   PTSD Symptoms:   Past Psychiatric History: Past therapy, one psychiatric hospitalization  Previous Psychotropic Medications: Yes   Substance Abuse History in the last 12 months:  No.  Consequences of Substance Abuse: Negative  Past Medical History:  Past Medical History:  Diagnosis Date  . Anxiety   . Depression   . Lupus   . PE (pulmonary embolism)   . PTSD (post-traumatic stress disorder)   . Stroke ([place])   . TIA (transient ischemic attack)     Past Surgical History:  Procedure Laterality Date  . ABDOMINAL SURGERY    . ABLATION      Family Psychiatric History:   Family History:  Family History  Problem Relation Age of Onset  . Leukemia Father   . Seizures Daughter   . Drug abuse Mother   . Alcohol abuse Mother     Social History:   Social History   Social History  . Marital status: Legally Separated    Spouse name: N/A  . Number of children: N/A  . Years of education: N/A   Social History Main Topics  . Smoking status: Never Smoker  . Smokeless tobacco: Never Used  . Alcohol use No  . Drug use: No  . Sexual activity: Not Currently  Other Topics Concern  . None   Social History Narrative  . None    Additional Social History:   Allergies:   Allergies  Allergen Reactions  . Codeine Shortness Of Breath  . Contrast Media [Iodinated Diagnostic Agents] Shortness Of Breath  . Tylenol [Acetaminophen] Shortness Of Breath    Metabolic Disorder Labs: No results found for: HGBA1C, MPG No results found for: PROLACTIN No results found for: CHOL, TRIG, HDL, CHOLHDL, VLDL, LDLCALC   Current  Medications: Current Outpatient Prescriptions  Medication Sig Dispense Refill  . alprazolam (XANAX) 2 MG tablet Take 2 mg by mouth at bedtime as needed for sleep.    Marland Kitchen buPROPion (WELLBUTRIN XL) 150 MG 24 hr tablet 1  qam  For 1 week the 2  qam 60 tablet 2  . hydroxychloroquine (PLAQUENIL) 200 MG tablet TAKE 200MG  BY MOUTH TWICE A DAY WITH FOOD OR MILK  3  . warfarin (COUMADIN) 5 MG tablet Take 1 tablet (5 mg total) by mouth daily at 6 PM. Adjust dose based on INR with primary care physician in 5 days (Patient taking differently: Take 2.5-5 mg by mouth daily. Take 5mg  by mouth on Monday, Wednesday, and Friday. Take 2.5mg  by mouth on Tuesday, Thursday, Saturday, and Sunday.) 30 tablet 0  . zolpidem (AMBIEN CR) 12.5 MG CR tablet TAKE 12.5 MG BY MOUTH EVERY NIGHT  1   No current facility-administered medications for this visit.     Neurologic: Headache: No Seizure: No Paresthesias:No  Musculoskeletal: Strength & Muscle Tone: within normal limits Gait & Station: normal Patient leans: N/A  Psychiatric Specialty Exam: ROS  Blood pressure 122/70, pulse 74, height 5\' 8"  (1.727 m), weight 156 lb 6.4 oz (70.9 kg).Body mass index is 23.78 kg/m.  General Appearance: Casual  Eye Contact:  Good  Speech:  Clear and Coherent  Volume:  Normal  Mood:  Dysphoric  Affect:  Appropriate  Thought Process:  Goal Directed  Orientation:  NA  Thought Content:  Logical  Suicidal Thoughts:  No  Homicidal Thoughts:  No  Memory:  NA  Judgement:  Good  Insight:  Good  Psychomotor Activity:  Normal  Concentration:    Recall:  [place] of Knowledge:Fair  Language: Good  Akathisia:  No  Handed:  Right  AIMS (if indicated):    Assets:  Desire for Improvement  ADL's:  Intact  Cognition: WNL  Sleep:      Treatment Plan Summary: At this time the patient will continue taking Wellbutrin but only take 150 mg. We will look into trying to get her the opportunity to receiveTMS> this patient to return to  see me in approximately 3 months. This patient is not suicidal. She denies any chest pain or shortness of breath. She has no neurological symptoms at this time. Haskel Schroeder, MD 3/7/20184:16 PM

## 2017-01-08 ENCOUNTER — Ambulatory Visit (INDEPENDENT_AMBULATORY_CARE_PROVIDER_SITE_OTHER): Payer: 59 | Admitting: Clinical

## 2017-01-08 ENCOUNTER — Encounter (HOSPITAL_COMMUNITY): Payer: Self-pay | Admitting: Clinical

## 2017-01-08 DIAGNOSIS — F4310 Post-traumatic stress disorder, unspecified: Secondary | ICD-10-CM

## 2017-01-08 DIAGNOSIS — F422 Mixed obsessional thoughts and acts: Secondary | ICD-10-CM | POA: Diagnosis not present

## 2017-01-08 DIAGNOSIS — F410 Panic disorder [episodic paroxysmal anxiety] without agoraphobia: Secondary | ICD-10-CM

## 2017-01-08 DIAGNOSIS — F339 Major depressive disorder, recurrent, unspecified: Secondary | ICD-10-CM

## 2017-01-08 NOTE — Progress Notes (Signed)
   THERAPIST PROGRESS NOTE  Session Time: 10:07 -11:00  Participation Level: Active  Behavioral Response: NeatAlertAnxious and Depressed   Type of Therapy: Individual Therapy  Treatment Goals addressed: improve psychiatric symptoms, elevate mood and improve unhelpful thought patterns. decrease irrational worries and fears, , . healthy coping skill.   Interventions: CBT and Motivational Interviewing psychoeducation  Summary: Lauren Chung is a 51 y.o. female who presents with Major depressive disorder, recurrent episode, severe, and PTSD, and Mixed Obsessional Thoughts and Acts, and Panic Disorder.   Suicidal/Homicidal: Nowithout intent/plan  Therapist Response: Lauren Chung met with clinician for an individual session. Lauren Chung shared about her psychiatric symptoms and her current life events. Lauren Chung shared that he medication was adjusted after last session. She shared that the twitching has subsided as have the sharp pains in her head. She shared she continues to be anxious and having difficulty sleeping. She shared that her son who is an adult with autism is planning to come live with her. She is glad he is coming but also worried that it will increase her symptoms. Lauren Chung shared some her symptoms. Most focused around safety. She made the statement that the world is completely unsafe. Clinician introduced some basic cbt concepts. Client and clinician discussed the thought emotion connection. Clinician introduced a 7 panel thought record sheet. Clinician asked open ended questions and Lauren Chung  Identified her unhelpful thoughts, her negative emotions, and the evidence for and against the negative thoughts. She was then able to formulate healthier alternative thoughts. Client and clinician discussed the process and the goal/ Lauren Chung shared that while she was able to complete the exercise and healthier alternative thoughts. She believed she would go back to her original unhelpful thoughts. Client  and clinician agreed to explore this topic further at future sessions. Clinician gave her a homework packet on depression which she agreed to complete and review at next session.  Plan: Return again in 1-2  weeks.  Diagnosis: Axis I: Major depressive disorder, recurrent episode, severe, and PTSD, and Mixed Obsessional Thoughts and Acts, and Panic Disorder.    Luanne Krzyzanowski A, LCSW 01/08/2017

## 2017-01-12 ENCOUNTER — Encounter (HOSPITAL_COMMUNITY): Payer: Self-pay | Admitting: Clinical

## 2017-01-14 ENCOUNTER — Other Ambulatory Visit (HOSPITAL_COMMUNITY): Payer: Self-pay

## 2017-01-14 MED ORDER — BUPROPION HCL ER (XL) 150 MG PO TB24: 150.0000 mg | ORAL_TABLET | Freq: Every day | ORAL | 0 refills | Status: DC

## 2017-01-14 MED ORDER — BUPROPION HCL ER (XL) 300 MG PO TB24: 300.0000 mg | ORAL_TABLET | Freq: Every day | ORAL | 0 refills | Status: DC

## 2017-01-29 ENCOUNTER — Encounter: Payer: Self-pay | Admitting: Neurology

## 2017-01-29 ENCOUNTER — Ambulatory Visit: Payer: BLUE CROSS/BLUE SHIELD | Admitting: Neurology

## 2017-01-29 DIAGNOSIS — Z029 Encounter for administrative examinations, unspecified: Secondary | ICD-10-CM

## 2017-02-02 ENCOUNTER — Ambulatory Visit (HOSPITAL_COMMUNITY): Payer: Self-pay | Admitting: Clinical

## 2017-02-03 ENCOUNTER — Ambulatory Visit (HOSPITAL_COMMUNITY): Payer: Self-pay | Admitting: Clinical

## 2017-02-12 ENCOUNTER — Ambulatory Visit (HOSPITAL_COMMUNITY): Payer: Self-pay | Admitting: Clinical

## 2017-02-27 ENCOUNTER — Other Ambulatory Visit (HOSPITAL_COMMUNITY): Payer: Self-pay

## 2017-02-27 MED ORDER — ALPRAZOLAM 2 MG PO TABS: 2.0000 mg | ORAL_TABLET | Freq: Every evening | ORAL | 1 refills | Status: DC | PRN

## 2017-03-02 ENCOUNTER — Telehealth (HOSPITAL_COMMUNITY): Payer: Self-pay

## 2017-03-02 ENCOUNTER — Ambulatory Visit (INDEPENDENT_AMBULATORY_CARE_PROVIDER_SITE_OTHER): Payer: PRIVATE HEALTH INSURANCE | Admitting: Clinical

## 2017-03-02 ENCOUNTER — Encounter (HOSPITAL_COMMUNITY): Payer: Self-pay | Admitting: Clinical

## 2017-03-02 DIAGNOSIS — F422 Mixed obsessional thoughts and acts: Secondary | ICD-10-CM | POA: Diagnosis not present

## 2017-03-02 DIAGNOSIS — F410 Panic disorder [episodic paroxysmal anxiety] without agoraphobia: Secondary | ICD-10-CM | POA: Diagnosis not present

## 2017-03-02 DIAGNOSIS — F4310 Post-traumatic stress disorder, unspecified: Secondary | ICD-10-CM | POA: Diagnosis not present

## 2017-03-02 DIAGNOSIS — F339 Major depressive disorder, recurrent, unspecified: Secondary | ICD-10-CM

## 2017-03-02 NOTE — Progress Notes (Signed)
   THERAPIST PROGRESS NOTE  Session Time: 10:59 - 11:57  Participation Level: Active  Behavioral Response: NeatAlertAnxious and Depressed  Type of Therapy: Individual Therapy  Treatment Goals addressed: improve psychiatric symptoms, improve unhelpful thought patterns. mplement healthy coping skill.   Interventions: CBT and Motivational Interviewing  Summary: Lauren Chung is a 51 y.o. female who presents with Major depressive disorder, recurrent episode, severe, and PTSD, and Mixed Obsessional Thoughts and Acts, and Panic Disorder.   Suicidal/Homicidal: Nowithout intent/plan  Therapist Response: Lauren Chung met with clinician for an individual session. Lauren Chung shared about her psychiatric symptoms, her current life events and her homework. Lauren Chung shared that she has continued to be depressed. She shared that she has been sleeping much longer than needed she shared that she sometimes that's she takes her sleeping medication in order to sleep longer. She denied any suicidal or homicidal ideation. Clinician asked nurse to join session. Nurse gave medical advice and asked her to stop by after session to discuss [place]( they agreed to discuss this after nurse had more information) .  Clinician asked open ended questions about her thoughts and emotions. She shared that she mostly wanted to isolate and only interacted with others out of obligation. She shared that her voices in her head told her she was inadequate. Client and clinician reviewed and discussed groundling techniques. Client  And clinician used a 7 panel thought record sheet to challenge the thoughts. Lauren Chung shared that while she liked the healthier alternative thoughts, she had difficultly believing them. Client and clinician discussed the process of challenging and changing beliefs.     Plan: Return again in 1-2  weeks.  Diagnosis: Axis I: Major depressive disorder, recurrent episode, severe, and PTSD, and Mixed Obsessional Thoughts  and Acts, and Panic Disorder.    Melvine Julin A, LCSW 03/02/2017

## 2017-03-02 NOTE — Telephone Encounter (Signed)
Patient was seeing Tharon Aquas this week, I was called in by the therapist because of medication concerns. Patient is taking her Ambien at night to sleep and then taking it again in the morning to go back to sleep. Patient is severely depressed. She is currently on Wellbutrin 150 mg, we can not go up on this because patient had a reaction to the higher dose. She is also taking Xanax 2 mg 1 po qhs. She was approve for ECT, but did not have transportation. Her insurance will not pay for [place] because she has not tried and failed enough medications. Patient has a follow up in June but I think she needs to be seen sooner, unfortunately you have no openings right now. Please advise, thank you

## 2017-03-03 DIAGNOSIS — Z7901 Long term (current) use of anticoagulants: Secondary | ICD-10-CM | POA: Diagnosis not present

## 2017-03-03 DIAGNOSIS — Z8673 Personal history of transient ischemic attack (TIA), and cerebral infarction without residual deficits: Secondary | ICD-10-CM | POA: Diagnosis not present

## 2017-03-03 DIAGNOSIS — Z86711 Personal history of pulmonary embolism: Secondary | ICD-10-CM | POA: Diagnosis not present

## 2017-03-03 DIAGNOSIS — G43909 Migraine, unspecified, not intractable, without status migrainosus: Secondary | ICD-10-CM | POA: Diagnosis not present

## 2017-03-03 DIAGNOSIS — D500 Iron deficiency anemia secondary to blood loss (chronic): Secondary | ICD-10-CM | POA: Diagnosis not present

## 2017-03-03 DIAGNOSIS — F418 Other specified anxiety disorders: Secondary | ICD-10-CM | POA: Diagnosis not present

## 2017-03-03 DIAGNOSIS — M329 Systemic lupus erythematosus, unspecified: Secondary | ICD-10-CM | POA: Diagnosis not present

## 2017-03-03 DIAGNOSIS — R944 Abnormal results of kidney function studies: Secondary | ICD-10-CM | POA: Diagnosis not present

## 2017-03-04 MED ORDER — DESIPRAMINE HCL 25 MG PO TABS: ORAL_TABLET | ORAL | 0 refills | Status: DC

## 2017-03-04 NOTE — Telephone Encounter (Signed)
Per Dr. Casimiro Needle we d/c'd patients Wellbutrin. He had me send an order for Desipramine 25 mg 1 po qd for 3 days and then increase to 2 a day. I called patient and explained how to take the medication, patient voiced understanding and verified she will be here for her appointment on 6/6

## 2017-03-23 ENCOUNTER — Encounter (HOSPITAL_COMMUNITY): Payer: Self-pay | Admitting: Clinical

## 2017-03-23 ENCOUNTER — Ambulatory Visit (INDEPENDENT_AMBULATORY_CARE_PROVIDER_SITE_OTHER): Payer: PRIVATE HEALTH INSURANCE | Admitting: Clinical

## 2017-03-23 DIAGNOSIS — F410 Panic disorder [episodic paroxysmal anxiety] without agoraphobia: Secondary | ICD-10-CM

## 2017-03-23 DIAGNOSIS — F4310 Post-traumatic stress disorder, unspecified: Secondary | ICD-10-CM

## 2017-03-23 DIAGNOSIS — F422 Mixed obsessional thoughts and acts: Secondary | ICD-10-CM | POA: Diagnosis not present

## 2017-03-23 DIAGNOSIS — F339 Major depressive disorder, recurrent, unspecified: Secondary | ICD-10-CM

## 2017-03-23 DIAGNOSIS — Z7901 Long term (current) use of anticoagulants: Secondary | ICD-10-CM | POA: Diagnosis not present

## 2017-03-23 DIAGNOSIS — Z8673 Personal history of transient ischemic attack (TIA), and cerebral infarction without residual deficits: Secondary | ICD-10-CM | POA: Diagnosis not present

## 2017-03-23 NOTE — Progress Notes (Signed)
   THERAPIST PROGRESS NOTE  Session Time: 11:02 - 11:57  Participation Level: Active  Behavioral Response: CasualAlertDepressed  Type of Therapy: Individual Therapy  Treatment Goals addressed: improve psychiatric symptoms, elevate mood, improve unhelpful thought patterns. decrease irrational worries and fears,  implement healthy coping skill.   Interventions: CBT and Motivational Interviewing  Summary: Lauren Chung is a 51 y.o. female who presents with Major depressive disorder, recurrent episode, severe, and PTSD, and Mixed Obsessional Thoughts and Acts, and Panic Disorder.   Suicidal/Homicidal: Nowithout intent/plan  Therapist Response: Kenetra met with clinician for an individual session. [name] shared about her psychiatric symptoms, her current life events and her homework. [name] shared that she was doing slightly better. Clinician asked open ended questions and [name] shared that her medication had been changes and that it helped her paranoia slightly. She shared she was able to go to her grandsons sporting event for the first time ever. She shared that she has a little bit more energy and desires to be more active. She shared however that she is still very house bound. She also shared that her son who is autistic (age 83) moved into the house with her. She is very pleased to have him there and is also working to adjust to his needs as he is unable to tolerate very much stimulation ( lights and noise for example). Client and clinician discussed her desire to be more active and to leave the house more. Clinician introduced a cbt ABC worksheet. Clinician asked open ended questions and [name] identified her negative thoughts and beliefs about leaving the house. She then identified how she feels when she thinks those thoughts. Client and clinician discussed whether or not these thoughts were true or helpful. She was then able to formulate healthier alternative thoughts.  Plan: Return  again in 1-2  weeks.  Diagnosis: Axis I: Major depressive disorder, recurrent episode, severe, and PTSD, and Mixed Obsessional Thoughts and Acts, and Panic Disorder.     Mayra Brahm A, LCSW 03/23/2017

## 2017-03-28 ENCOUNTER — Encounter (HOSPITAL_COMMUNITY): Payer: Self-pay | Admitting: Clinical

## 2017-03-31 ENCOUNTER — Ambulatory Visit (INDEPENDENT_AMBULATORY_CARE_PROVIDER_SITE_OTHER): Payer: PRIVATE HEALTH INSURANCE | Admitting: Clinical

## 2017-03-31 ENCOUNTER — Encounter (HOSPITAL_COMMUNITY): Payer: Self-pay | Admitting: Clinical

## 2017-03-31 DIAGNOSIS — F410 Panic disorder [episodic paroxysmal anxiety] without agoraphobia: Secondary | ICD-10-CM | POA: Diagnosis not present

## 2017-03-31 DIAGNOSIS — F422 Mixed obsessional thoughts and acts: Secondary | ICD-10-CM | POA: Diagnosis not present

## 2017-03-31 DIAGNOSIS — F4310 Post-traumatic stress disorder, unspecified: Secondary | ICD-10-CM | POA: Diagnosis not present

## 2017-03-31 DIAGNOSIS — F339 Major depressive disorder, recurrent, unspecified: Secondary | ICD-10-CM

## 2017-03-31 NOTE — Progress Notes (Signed)
   THERAPIST PROGRESS NOTE  Session Time: 10:00 - 10:55  Participation Level: Active  Behavioral Response: CasualAlertDepressed  Type of Therapy: Individual Therapy  Treatment Goals addressed: improve psychiatric symptoms, elevate mood,  improve unhelpful thought patterns. learn about diagnosis, healthy coping skill.   Interventions: CBT and Motivational Interviewing, psychoeducation,   Summary: Lauren Chung is a 51 y.o. female who presents with Major depressive disorder, recurrent episode, severe, and PTSD, and Mixed Obsessional Thoughts and Acts, and Panic Disorder.   Suicidal/Homicidal: Nowithout intent/plan  Therapist Response: Lauren Chung met with clinician for an individual session. Lauren Chung shared about her psychiatric symptoms, her current life events and her homework. Lauren Chung shared that she has been having a lot of difficulty with her symptoms.  She shared that she is not currently actively suicidal or homicidal , and her suicidal thoughts are down, though she often thinks she would be better off dead.  She shared that she has not been able to go out of the house. She shared she has been  Feeling, jittery, angry and irritable.She stated that she is taking her meds as prescribes but they were changed two weeks ago. She shared that she is tired of taking medications. She shared that she takes a lot of medication for her physical health also (Lupis) . Lauren Chung and clinician discussed the importance of taking her medication as prescribe. Lauren Chung and clinician reviewed and discussed her homework packet Depression 1. Lauren Chung and clinician discussed her symptoms.  She also completed an CBT - abc worksheet. Lauren Chung and clinician reviewed and discussed it. Another on e was done in session. Clinician asked open ended questions and Lauren Chung filled one out for one of her negative thoughts. As she filled it out clinician asked clarifying questions and she expanded on her answers. Lauren Chung and clinician  discussed how her behaviors might change if she believed her healthier alternative thoughts.    Plan: Return again in 1-2  weeks.  Diagnosis: Axis I: Major depressive disorder, recurrent episode, severe, and PTSD, and Mixed Obsessional Thoughts and Acts, and Panic Disorder.    Davie Sagona A, LCSW 03/31/2017

## 2017-04-06 ENCOUNTER — Other Ambulatory Visit (HOSPITAL_COMMUNITY): Payer: Self-pay

## 2017-04-06 MED ORDER — DESIPRAMINE HCL 25 MG PO TABS: ORAL_TABLET | ORAL | 0 refills | Status: DC

## 2017-04-07 ENCOUNTER — Encounter (HOSPITAL_COMMUNITY): Payer: Self-pay | Admitting: Clinical

## 2017-04-07 ENCOUNTER — Ambulatory Visit (INDEPENDENT_AMBULATORY_CARE_PROVIDER_SITE_OTHER): Payer: PRIVATE HEALTH INSURANCE | Admitting: Clinical

## 2017-04-07 DIAGNOSIS — F339 Major depressive disorder, recurrent, unspecified: Secondary | ICD-10-CM

## 2017-04-07 DIAGNOSIS — F422 Mixed obsessional thoughts and acts: Secondary | ICD-10-CM

## 2017-04-07 DIAGNOSIS — F4310 Post-traumatic stress disorder, unspecified: Secondary | ICD-10-CM

## 2017-04-07 DIAGNOSIS — F410 Panic disorder [episodic paroxysmal anxiety] without agoraphobia: Secondary | ICD-10-CM

## 2017-04-07 NOTE — Progress Notes (Signed)
   THERAPIST PROGRESS NOTE  Session Time: 11:03 - 11:58  Participation Level: Active  Behavioral Response: CasualAlertDepressed  Type of Therapy: Individual Therapy  Treatment Goals addressed: improve psychiatric symptoms, elevate mood,  implement healthy coping skill.   Interventions: CBT and Motivational Interviewing   Summary: Lauren Chung is a 51 y.o. female who presents with Major depressive disorder, recurrent episode, severe, and PTSD, and Mixed Obsessional Thoughts and Acts, and Panic Disorder.   Suicidal/Homicidal: Nowithout intent/plan  Therapist Response: Lauren Chung met with clinician for an individual session. Lauren Chung shared about her psychiatric symptoms, her current life events and her homework. Lauren Chung shared that she is feeling a little bit better. Clinician asked open ended questions. She shared that she feels her medication is working well.  She shared that she shared for the first time with her family that she was going to therapy and had been suffering from depression. She shared that she was surprised that her children were supportive. She also shared that she has been getting out more than prior but was having high anxiety when doing so.  She also shared she was concerned because in the past before when she gets feeling good she has stopped taking her Lupis medication. Clinician asked open ended questions and Lauren Chung shared that it gives her a since of control. Client and clinician agreed to discuss other ways for her to have control at future sessions.  Plan: Return again in 1-2  weeks.  Diagnosis: Axis I: Major depressive disorder, recurrent episode, severe, and PTSD, and Mixed Obsessional Thoughts and Acts, and Panic Disorder.    Tam Delisle A, LCSW 04/07/2017

## 2017-04-08 ENCOUNTER — Ambulatory Visit (INDEPENDENT_AMBULATORY_CARE_PROVIDER_SITE_OTHER): Payer: PRIVATE HEALTH INSURANCE | Admitting: Psychiatry

## 2017-04-08 VITALS — BP 114/72 | HR 95 | Ht 68.0 in | Wt 168.0 lb

## 2017-04-08 DIAGNOSIS — Z886 Allergy status to analgesic agent status: Secondary | ICD-10-CM

## 2017-04-08 DIAGNOSIS — F329 Major depressive disorder, single episode, unspecified: Secondary | ICD-10-CM | POA: Diagnosis not present

## 2017-04-08 DIAGNOSIS — F320 Major depressive disorder, single episode, mild: Secondary | ICD-10-CM

## 2017-04-08 DIAGNOSIS — Z79899 Other long term (current) drug therapy: Secondary | ICD-10-CM

## 2017-04-08 DIAGNOSIS — Z91041 Radiographic dye allergy status: Secondary | ICD-10-CM | POA: Diagnosis not present

## 2017-04-08 DIAGNOSIS — Z814 Family history of other substance abuse and dependence: Secondary | ICD-10-CM

## 2017-04-08 DIAGNOSIS — Z811 Family history of alcohol abuse and dependence: Secondary | ICD-10-CM

## 2017-04-08 DIAGNOSIS — Z7901 Long term (current) use of anticoagulants: Secondary | ICD-10-CM

## 2017-04-08 MED ORDER — DESIPRAMINE HCL 25 MG PO TABS: ORAL_TABLET | ORAL | 4 refills | Status: DC

## 2017-04-08 NOTE — Progress Notes (Signed)
Psychiatric Initial Adult Assessment   Patient Identification: Lauren Chung MRN:  [id number] Date of Evaluation:  04/08/2017 Referral Source [facility] Chief Complaint:   Visit Diagnosis: No diagnosis found.  History of Present Illness:  Today the patient is improved. She claims her depression is less. Since being on desipramine she feels better. She actually is sleeping and eating fairly well. She's got good energy. She denies problems thinking concentrating. She is a chronic sense of worthlessness and fleeting suicidal thinking. She's not having true suicidal intent this time. It should be noted that about 3 months ago she actually made a suicide attempt by overdosing but woke up and was fine. She felt shocked by says that it should grow that she doesn't want to try she denies the use of alcohol or drugs. She has 3 dogs. She enjoys watching TV. She is very isolated and withdrawn lifestyle. She has one adult child lives in [place] 3 grandchildren and sees him weekly. On feedings her right spouse. Fortunately her lupus is doing fairly well. She's having a joints problems at this time. She is difficulty determining purpose and passion. The patient doesn't go to church she doesn't have many activities. She is working pretty diligently with her therapist. She is more she has to do. The patient is learning calmer approach for her depression. At this time the antidepressant is helpful. It is that her Wellbutrin and she was on for. Depression Symptoms:  depressed mood, (Hypo) Manic Symptoms:   Anxiety Symptoms:   Psychotic Symptoms:   PTSD Symptoms:   Past Psychiatric History: Past therapy, one psychiatric hospitalization  Previous Psychotropic Medications: Yes   Substance Abuse History in the last 12 months:  No.  Consequences of Substance Abuse: Negative  Past Medical History:  Past Medical History:  Diagnosis Date  . Anxiety   . Depression   . Lupus   . PE (pulmonary embolism)    . PTSD (post-traumatic stress disorder)   . Stroke ([place])   . TIA (transient ischemic attack)     Past Surgical History:  Procedure Laterality Date  . ABDOMINAL SURGERY    . ABLATION      Family Psychiatric History:   Family History:  Family History  Problem Relation Age of Onset  . Leukemia Father   . Seizures Daughter   . Drug abuse Mother   . Alcohol abuse Mother     Social History:   Social History   Social History  . Marital status: Legally Separated    Spouse name: N/A  . Number of children: N/A  . Years of education: N/A   Social History Main Topics  . Smoking status: Never Smoker  . Smokeless tobacco: Never Used  . Alcohol use No  . Drug use: No  . Sexual activity: Not Currently   Other Topics Concern  . Not on file   Social History Narrative  . No narrative on file    Additional Social History:   Allergies:   Allergies  Allergen Reactions  . Codeine Shortness Of Breath  . Contrast Media [Iodinated Diagnostic Agents] Shortness Of Breath  . Tylenol [Acetaminophen] Shortness Of Breath    Metabolic Disorder Labs: No results found for: HGBA1C, MPG No results found for: PROLACTIN No results found for: CHOL, TRIG, HDL, CHOLHDL, VLDL, LDLCALC   Current Medications: Current Outpatient Prescriptions  Medication Sig Dispense Refill  . alprazolam (XANAX) 2 MG tablet Take 1 tablet (2 mg total) by mouth at bedtime as needed for sleep. [place]  tablet 1  . buPROPion (WELLBUTRIN XL) 150 MG 24 hr tablet Take 1 tablet (150 mg total) by mouth daily. 90 tablet 0  . desipramine (NORPRAMIN) 25 MG tablet 2 tablets (50 mg) by mouth daily 60 tablet 4  . hydroxychloroquine (PLAQUENIL) 200 MG tablet TAKE 200MG  BY MOUTH TWICE A DAY WITH FOOD OR MILK  3  . warfarin (COUMADIN) 5 MG tablet Take 1 tablet (5 mg total) by mouth daily at 6 PM. Adjust dose based on INR with primary care physician in 5 days (Patient taking differently: Take 2.5-5 mg by mouth daily. Take 5mg  by mouth  on Monday, Wednesday, and Friday. Take 2.5mg  by mouth on Tuesday, Thursday, Saturday, and Sunday.) 30 tablet 0  . zolpidem (AMBIEN CR) 12.5 MG CR tablet TAKE 12.5 MG BY MOUTH EVERY NIGHT  1   No current facility-administered medications for this visit.     Neurologic: Headache: No Seizure: No Paresthesias:No  Musculoskeletal: Strength & Muscle Tone: within normal limits Gait & Station: normal Patient leans: N/A  Psychiatric Specialty Exam: ROS  Blood pressure 114/72, pulse 95, height 5\' 8"  (1.727 m), weight 168 lb (76.2 kg), SpO2 97 %.Body mass index is 25.54 kg/m.  General Appearance: Casual  Eye Contact:  Good  Speech:  Clear and Coherent  Volume:  Normal  Mood:  Dysphoric  Affect:  Appropriate  Thought Process:  Goal Directed  Orientation:  NA  Thought Content:  Logical  Suicidal Thoughts:  No  Homicidal Thoughts:  No  Memory:  NA  Judgement:  Good  Insight:  Good  Psychomotor Activity:  Normal  Concentration:    Recall:  [place] of Knowledge:Fair  Language: Good  Akathisia:  No  Handed:  Right  AIMS (if indicated):    Assets:  Desire for Improvement  ADL's:  Intact  Cognition: WNL  Sleep:      Treatment Plan Summary: 6/6/20183:13 PM  At this time the patient is improved. She probably is about 60% her baseline. At this time we'll hold off [place]. The possibility of going back to [place] should be reconsidered on her next visit. The fact that she is better is encouraging. The patient takes nothing for anxiety and for sleep. She's never been psychotic. She continues to make progress in therapy [facility] medicines therapy. The possibility of [place] will be discussed with her therapist as well as with her next visit in a few months. At this time do not believe the patient is acutely suicidal.

## 2017-04-14 ENCOUNTER — Ambulatory Visit (INDEPENDENT_AMBULATORY_CARE_PROVIDER_SITE_OTHER): Payer: PRIVATE HEALTH INSURANCE | Admitting: Clinical

## 2017-04-14 ENCOUNTER — Encounter (HOSPITAL_COMMUNITY): Payer: Self-pay | Admitting: Clinical

## 2017-04-14 DIAGNOSIS — F339 Major depressive disorder, recurrent, unspecified: Secondary | ICD-10-CM | POA: Diagnosis not present

## 2017-04-14 DIAGNOSIS — F422 Mixed obsessional thoughts and acts: Secondary | ICD-10-CM

## 2017-04-14 DIAGNOSIS — F4310 Post-traumatic stress disorder, unspecified: Secondary | ICD-10-CM

## 2017-04-14 DIAGNOSIS — F410 Panic disorder [episodic paroxysmal anxiety] without agoraphobia: Secondary | ICD-10-CM | POA: Diagnosis not present

## 2017-04-14 NOTE — Progress Notes (Signed)
   THERAPIST PROGRESS NOTE  Session Time: 10:00 -11:00   Participation Level: Active  Behavioral Response: CasualAlertDepressed  Type of Therapy: Individual Therapy  Treatment Goals addressed: improve psychiatric symptoms, elevate mood.  learn diagnosis, healthy coping skill.   Interventions: CBT and Motivational Interviewing   Summary: [name] is a 50 y.o. female who presents with Major depressive disorder, recurrent episode, severe, and PTSD, and Mixed Obsessional Thoughts and Acts, and Panic Disorder.   Suicidal/Homicidal: Nowithout intent/plan  Therapist Response: Somer met with clinician for an individual session. [name] shared about her psychiatric symptoms, her current life events. Clinician shared with [name] that clinician would be leaving the facility in a month. Client and clinician discussed the transition to another clinician and continued therapy.   [name] shared that she had stopped taking her medication 2 days ago and has had increased symptoms. Clinician asked open ended questions  and [name] shared about her decision to stop taking her medication. She shared she was feeling better when taking the medication. She shared that she struggles with feeling worthy of feeling good. Clinician asked open ended questions about what happens  When she doesn't take her medication  (ends up in the hospital and forced to take her medication) Clinician asked open ended questions about each step in the process of not taking her medication. Clinician asked open ended questions about her desire to feel good and her thoughts about worthiness. [name] shared some of her negative thoughts about her worthiness. Client and clinician discussed the evidence for and against the negative thoughts. She was then able to formulate healthier alternative thoughts. She shared she was not yet ready to believe the healthier alternative thoughts. Clinician asked open ended questions and [name] shared  that she thought she would try taking her medication again.   Plan: Return again in 1-2  weeks.  Diagnosis: Axis I: Major depressive disorder, recurrent episode, severe, and PTSD, and Mixed Obsessional Thoughts and Acts, and Panic Disorder.   , A, LCSW 04/14/2017  

## 2017-04-17 ENCOUNTER — Other Ambulatory Visit (HOSPITAL_COMMUNITY): Payer: Self-pay

## 2017-04-17 MED ORDER — BUPROPION HCL ER (XL) 150 MG PO TB24: 150.0000 mg | ORAL_TABLET | Freq: Every day | ORAL | 0 refills | Status: DC

## 2017-04-21 ENCOUNTER — Ambulatory Visit (INDEPENDENT_AMBULATORY_CARE_PROVIDER_SITE_OTHER): Payer: PRIVATE HEALTH INSURANCE | Admitting: Clinical

## 2017-04-21 ENCOUNTER — Encounter (HOSPITAL_COMMUNITY): Payer: Self-pay | Admitting: Clinical

## 2017-04-21 DIAGNOSIS — F4310 Post-traumatic stress disorder, unspecified: Secondary | ICD-10-CM | POA: Diagnosis not present

## 2017-04-21 DIAGNOSIS — F422 Mixed obsessional thoughts and acts: Secondary | ICD-10-CM

## 2017-04-21 DIAGNOSIS — F339 Major depressive disorder, recurrent, unspecified: Secondary | ICD-10-CM

## 2017-04-21 NOTE — Progress Notes (Signed)
   THERAPIST PROGRESS NOTE  Session Time: 11:00 -11:55  Participation Level: Active  Behavioral Response: Neat and Well GroomedAlertDepressed  Type of Therapy: Individual Therapy  Treatment Goals addressed: improve psychiatric symptoms, elevate moodt,  improve unhelpful thought patterns. decrease irrational worries and fears, decreased panic,learn about  Diagnosis, healthy coping skill.   Interventions: CBT and Motivational Interviewing   Summary: Lauren Chung is Chung 51 y.o. female who presents with Major depressive disorder, recurrent episode, severe, and PTSD, and Mixed Obsessional Thoughts and Acts, and Panic Disorder.   Suicidal/Homicidal: Nowithout intent/plan  Therapist Response: Lauren Chung met with clinician for an individual session. Lauren Chung shared about her psychiatric symptoms, her current life events and her homework. Lauren Chung shared that she had been doing Chung little better since last session. She shared that she had started taking her medication again the day after the last session. She shared that she decided she did not want to get sick again. Lauren Chung shared that she had done fairly well ( still isolating, but not feeling as bad) until yesterday. She stated that she had Chung panic attack. Clinician asked open ended questions  And Lauren Chung shared about the physical symptoms. She then shared the negative thoughts that she experienced. Clinician asked open ended questions and  Lauren Chung identified the evidence for and against the thoughts. She was then able to formulate healthier alternative thoughts. Lauren Chung and clinician discussed her diagnosis. Lauren Chung and clinician discussed panic attacks as false alarms. Lauren Chung and clinician discussed healthy coping skills.   Plan: Return again in 1-2  weeks.  Diagnosis: Axis I: Major depressive disorder, recurrent episode, severe, and PTSD, and Mixed Obsessional Thoughts and Acts, and Panic Disorder.    Lauren Vandenheuvel A, LCSW 04/21/2017

## 2017-04-28 ENCOUNTER — Ambulatory Visit (HOSPITAL_COMMUNITY): Payer: Self-pay | Admitting: Clinical

## 2017-06-05 DIAGNOSIS — M329 Systemic lupus erythematosus, unspecified: Secondary | ICD-10-CM | POA: Diagnosis not present

## 2017-06-05 DIAGNOSIS — Z8673 Personal history of transient ischemic attack (TIA), and cerebral infarction without residual deficits: Secondary | ICD-10-CM | POA: Diagnosis not present

## 2017-06-05 DIAGNOSIS — R944 Abnormal results of kidney function studies: Secondary | ICD-10-CM | POA: Diagnosis not present

## 2017-06-05 DIAGNOSIS — I10 Essential (primary) hypertension: Secondary | ICD-10-CM | POA: Diagnosis not present

## 2017-06-05 DIAGNOSIS — Z7901 Long term (current) use of anticoagulants: Secondary | ICD-10-CM | POA: Diagnosis not present

## 2017-06-05 DIAGNOSIS — G4709 Other insomnia: Secondary | ICD-10-CM | POA: Diagnosis not present

## 2017-06-05 DIAGNOSIS — G43909 Migraine, unspecified, not intractable, without status migrainosus: Secondary | ICD-10-CM | POA: Diagnosis not present

## 2017-06-24 ENCOUNTER — Ambulatory Visit (INDEPENDENT_AMBULATORY_CARE_PROVIDER_SITE_OTHER): Payer: PRIVATE HEALTH INSURANCE | Admitting: Psychiatry

## 2017-06-24 ENCOUNTER — Encounter (HOSPITAL_COMMUNITY): Payer: Self-pay | Admitting: Psychiatry

## 2017-06-24 VITALS — BP 112/76 | HR 94 | Ht 67.5 in | Wt 168.0 lb

## 2017-06-24 DIAGNOSIS — F331 Major depressive disorder, recurrent, moderate: Secondary | ICD-10-CM | POA: Diagnosis not present

## 2017-06-24 DIAGNOSIS — Z813 Family history of other psychoactive substance abuse and dependence: Secondary | ICD-10-CM

## 2017-06-24 DIAGNOSIS — Z811 Family history of alcohol abuse and dependence: Secondary | ICD-10-CM

## 2017-06-24 MED ORDER — DESIPRAMINE HCL 25 MG PO TABS: ORAL_TABLET | ORAL | 6 refills | Status: DC

## 2017-06-24 MED ORDER — ALPRAZOLAM 2 MG PO TABS: 2.0000 mg | ORAL_TABLET | Freq: Every evening | ORAL | 5 refills | Status: DC | PRN

## 2017-06-24 MED ORDER — ZOLPIDEM TARTRATE ER 12.5 MG PO TBCR: 12.5000 mg | EXTENDED_RELEASE_TABLET | Freq: Every evening | ORAL | 5 refills | Status: DC | PRN

## 2017-06-24 NOTE — Progress Notes (Signed)
Psychiatric Initial Adult Assessment   Patient Identification: Lauren Chung MRN:  [id number] Date of Evaluation:  06/24/2017 Referral Source [facility] Officer Chief Complaint:   Chief Complaint    Follow-up     Visit Diagnosis: No diagnosis found.  History of Present Illness:  Today the patient is doing very well. Her mood is better. She now is stating individual for the last 3 months. She likes him a lot. The patient is sleeping and eating well. She's got good energy. Her sense of worth is much improved. She no longer suicidal. The patient is able to think and concentrate well. She likes doing yard work. She drinks no alcohol uses no drugs. Her primary care doctor acid would be possibility would take over her Ambien prescriptions and that's the problem. The patient is very stable this time. She's positive and optimistic. Depression Symptoms:  depressed mood, (Hypo) Manic Symptoms:   Anxiety Symptoms:   Psychotic Symptoms:   PTSD Symptoms:   Past Psychiatric History: Past therapy, one psychiatric hospitalization  Previous Psychotropic Medications: Yes   Substance Abuse History in the last 12 months:  No.  Consequences of Substance Abuse: Negative  Past Medical History:  Past Medical History:  Diagnosis Date  . Anxiety   . Depression   . Lupus   . PE (pulmonary embolism)   . PTSD (post-traumatic stress disorder)   . Stroke ([place])   . TIA (transient ischemic attack)     Past Surgical History:  Procedure Laterality Date  . ABDOMINAL SURGERY    . ABLATION      Family Psychiatric History:   Family History:  Family History  Problem Relation Age of Onset  . Leukemia Father   . Seizures Daughter   . Drug abuse Mother   . Alcohol abuse Mother     Social History:   Social History   Social History  . Marital status: Legally Separated    Spouse name: N/A  . Number of children: N/A  . Years of education: N/A   Social History Main Topics  . Smoking status: Never  Smoker  . Smokeless tobacco: Never Used  . Alcohol use No  . Drug use: No  . Sexual activity: Yes    Partners: Male    Birth control/ protection: Condom   Other Topics Concern  . None   Social History Narrative  . None    Additional Social History:   Allergies:   Allergies  Allergen Reactions  . Codeine Shortness Of Breath  . Contrast Media [Iodinated Diagnostic Agents] Shortness Of Breath  . Tylenol [Acetaminophen] Shortness Of Breath    Metabolic Disorder Labs: No results found for: HGBA1C, MPG No results found for: PROLACTIN No results found for: CHOL, TRIG, HDL, CHOLHDL, VLDL, LDLCALC   Current Medications: Current Outpatient Prescriptions  Medication Sig Dispense Refill  . alprazolam (XANAX) 2 MG tablet Take 1 tablet (2 mg total) by mouth at bedtime as needed for sleep. 30 tablet 5  . buPROPion (WELLBUTRIN XL) 150 MG 24 hr tablet Take 1 tablet (150 mg total) by mouth daily. 90 tablet 0  . desipramine (NORPRAMIN) 25 MG tablet 2 tablets (50 mg) by mouth daily 60 tablet 6  . hydroxychloroquine (PLAQUENIL) 200 MG tablet TAKE 200MG  BY MOUTH TWICE A DAY WITH FOOD OR MILK  3  . warfarin (COUMADIN) 5 MG tablet Take 1 tablet (5 mg total) by mouth daily at 6 PM. Adjust dose based on INR with primary care physician in 5 days (Patient taking  differently: Take 2.5-5 mg by mouth daily. Take 5mg  by mouth on Monday, Wednesday, and Friday. Take 2.5mg  by mouth on Tuesday, Thursday, Saturday, and Sunday.) 30 tablet 0  . zolpidem (AMBIEN CR) 12.5 MG CR tablet TAKE 12.5 MG BY MOUTH EVERY NIGHT  1  . zolpidem (AMBIEN CR) 12.5 MG CR tablet Take 1 tablet (12.5 mg total) by mouth at bedtime as needed for sleep. 30 tablet 5   No current facility-administered medications for this visit.     Neurologic: Headache: No Seizure: No Paresthesias:No  Musculoskeletal: Strength & Muscle Tone: within normal limits Gait & Station: normal Patient leans: N/A  Psychiatric Specialty Exam: ROS   Blood pressure 112/76, pulse 94, height 5' 7.5" (1.715 m), weight 168 lb (76.2 kg), SpO2 96 %.Body mass index is 25.92 kg/m.  General Appearance: Casual  Eye Contact:  Good  Speech:  Clear and Coherent  Volume:  Normal  Mood:  Dysphoric  Affect:  Appropriate  Thought Process:  Goal Directed  Orientation:  NA  Thought Content:  Logical  Suicidal Thoughts:  No  Homicidal Thoughts:  No  Memory:  NA  Judgement:  Good  Insight:  Good  Psychomotor Activity:  Normal  Concentration:    Recall:  [place] of Knowledge:Fair  Language: Good  Akathisia:  No  Handed:  Right  AIMS (if indicated):    Assets:  Desire for Improvement  ADL's:  Intact  Cognition: WNL  Sleep:      Treatment Plan Summary: 8/22/20183:27 PM  At this time the patient is doing very well and will continue taking desipramine 50 mg, continue Ambien 12.5 mg CR and continue Xanax 2 mg. Her next visit in 4 months we will start reducing her Xanax a small amount. The patient denies chest pain or shortness of breath. Physically she is very healthy. She denies any neurological symptoms at all. She is functioning very well certainly is not suicidal and is positive and optimistic.

## 2017-06-29 ENCOUNTER — Telehealth (HOSPITAL_COMMUNITY): Payer: Self-pay | Admitting: Clinical

## 2017-07-01 ENCOUNTER — Encounter (HOSPITAL_COMMUNITY): Payer: Self-pay | Admitting: Clinical

## 2017-07-01 ENCOUNTER — Ambulatory Visit (INDEPENDENT_AMBULATORY_CARE_PROVIDER_SITE_OTHER): Payer: PRIVATE HEALTH INSURANCE | Admitting: Clinical

## 2017-07-01 DIAGNOSIS — F332 Major depressive disorder, recurrent severe without psychotic features: Secondary | ICD-10-CM | POA: Diagnosis not present

## 2017-07-01 DIAGNOSIS — F410 Panic disorder [episodic paroxysmal anxiety] without agoraphobia: Secondary | ICD-10-CM

## 2017-07-01 DIAGNOSIS — F4310 Post-traumatic stress disorder, unspecified: Secondary | ICD-10-CM

## 2017-07-01 DIAGNOSIS — F422 Mixed obsessional thoughts and acts: Secondary | ICD-10-CM | POA: Diagnosis not present

## 2017-07-01 NOTE — Progress Notes (Signed)
   THERAPIST PROGRESS NOTE  Session Time: 11:55am-1:00pm  Participation Level: Active  Behavioral Response: CasualAlertDepressed  Type of Therapy: Individual Therapy  Treatment Goals addressed: improve psychiatric symptoms, elevate mood as evidenced by increased social interactions, increased interest, increased self-esteem, and improve unhelpful thought patterns. decrease irrational worries and fears as evidenced by the desire and ability to leave the house, shop with fear, decreased panic, discuss and process past traumas as evidenced by ability to discuss them without feeling overwhelmed. Client will learn about her diagnosis and learn and implement healthy coping skill.   Interventions: CBT and Motivational Interviewing psychoeducation, grounding and mindfulness techniques  Summary: Lauren Chung is a 51 y.o. female who presents with Major depressive disorder, recurrent episode, severe, and PTSD, and Mixed Obsessional Thoughts and Acts, and Panic Disorder.   Suicidal/Homicidal: No without intent/plan  Therapist Response: [place] met with clinician for an individual session. Lauren Chung shared about her psychiatric symptoms, her current life events and her homework. Lauren Chung shared that she has not been consistently taking her antidepressant medication over the past week. Lauren Chung reported that she does not feel she deserves happiness or positive relationships. Lauren Chung also reported that she has ended her 3 month relationship due to fear that it was too good to maintain. Clinician challenged thoughts and core beliefs about her ability to be happy and how deserving she is to have happiness. Clinician reviewed and updated treatment plan.   Plan: Return again in 1-2  weeks.  Diagnosis: Axis I: Major depressive disorder, recurrent episode, severe, and PTSD, and Mixed Obsessional Thoughts and Acts, and Panic Disorder.  Carlus Pavlov, LCSW 07/01/17

## 2017-07-08 ENCOUNTER — Encounter (HOSPITAL_COMMUNITY): Payer: Self-pay | Admitting: Clinical

## 2017-07-08 ENCOUNTER — Ambulatory Visit (INDEPENDENT_AMBULATORY_CARE_PROVIDER_SITE_OTHER): Payer: PRIVATE HEALTH INSURANCE | Admitting: Clinical

## 2017-07-08 DIAGNOSIS — F410 Panic disorder [episodic paroxysmal anxiety] without agoraphobia: Secondary | ICD-10-CM | POA: Diagnosis not present

## 2017-07-08 DIAGNOSIS — F422 Mixed obsessional thoughts and acts: Secondary | ICD-10-CM | POA: Diagnosis not present

## 2017-07-08 DIAGNOSIS — F331 Major depressive disorder, recurrent, moderate: Secondary | ICD-10-CM

## 2017-07-08 DIAGNOSIS — F4310 Post-traumatic stress disorder, unspecified: Secondary | ICD-10-CM | POA: Diagnosis not present

## 2017-07-08 NOTE — Progress Notes (Signed)
   THERAPIST PROGRESS NOTE  Session Time: 1:35-2:30pm  Participation Level: Active  Behavioral Response: NeatAlertDepressed  Type of Therapy: Individual Therapy  Treatment Goals addressed: improve psychiatric symptoms, elevate mood as evidenced by increased social interactions, increased interest, increased self-esteem, and improve unhelpful thought patterns. decrease irrational worries and fears as evidenced by the desire and ability to leave the house, shop with fear, decreased panic, discuss and process past traumas as evidenced by ability to discuss them without feeling overwhelmed. Client will learn about her diagnosis and learn and implement healthy coping skill.   Interventions: CBT and Motivational Interviewing psychoeducation, grounding and mindfulness techniques  Summary: Lauren Chung is a 51 y.o. female who presents with Major depressive disorder, recurrent episode, severe, and PTSD, and Mixed Obsessional Thoughts and Acts, and Panic Disorder.   Suicidal/Homicidal: Nowithout intent/plan  Therapist Response: Lauren Chung met with clinician for an individual session. Lauren Chung shared about her psychiatric symptoms, her current life events and her homework. Lauren Chung shared that she has restarted her medication and has been feeling better. Lauren Chung reported concern that her son-in-law's aunt will be moving next door from [place], which may force her to become more social. Lauren Chung reports she tends to isolate in her room and continues to struggle with feelings of worthlessness. Utilized CBT to identify thoughts, feelings, and bxs. Also provided psychoeducation about core beliefs.   Plan: Return again in 1-2  weeks.  Diagnosis: Axis I: Major depressive disorder, recurrent episode, severe, and PTSD, and Mixed Obsessional Thoughts and Acts, and Panic Disorder.                            Mindi Curling, LCSW 07/08/17  Powell,Frances A, LCSW 07/08/2017

## 2017-07-15 ENCOUNTER — Ambulatory Visit (HOSPITAL_COMMUNITY): Payer: Self-pay | Admitting: Licensed Clinical Social Worker

## 2017-10-21 ENCOUNTER — Ambulatory Visit (INDEPENDENT_AMBULATORY_CARE_PROVIDER_SITE_OTHER): Payer: Medicare Other | Admitting: Psychiatry

## 2017-10-21 ENCOUNTER — Encounter (HOSPITAL_COMMUNITY): Payer: Self-pay | Admitting: Psychiatry

## 2017-10-21 VITALS — BP 112/68 | HR 90 | Ht 67.0 in | Wt 162.0 lb

## 2017-10-21 DIAGNOSIS — Z813 Family history of other psychoactive substance abuse and dependence: Secondary | ICD-10-CM

## 2017-10-21 DIAGNOSIS — F339 Major depressive disorder, recurrent, unspecified: Secondary | ICD-10-CM | POA: Diagnosis not present

## 2017-10-21 DIAGNOSIS — Z811 Family history of alcohol abuse and dependence: Secondary | ICD-10-CM | POA: Diagnosis not present

## 2017-10-21 MED ORDER — DESIPRAMINE HCL 25 MG PO TABS: ORAL_TABLET | ORAL | 6 refills | Status: DC

## 2017-10-21 MED ORDER — ALPRAZOLAM 2 MG PO TABS: 2.0000 mg | ORAL_TABLET | Freq: Every evening | ORAL | 2 refills | Status: DC | PRN

## 2017-10-21 MED ORDER — ZOLPIDEM TARTRATE ER 12.5 MG PO TBCR: EXTENDED_RELEASE_TABLET | ORAL | 2 refills | Status: DC

## 2017-10-21 NOTE — Progress Notes (Signed)
Psychiatric Initial Adult Assessment   Patient Identification: Lauren Chung MRN:  [id number] Date of Evaluation:  10/21/2017 Referral Source [facility] Officer Chief Complaint:    Visit Diagnosis: No diagnosis found.  History of Present Illness:  Patient is not doing well. Unfortunately she has a lot of stigma from her family. She is [place] she says all of her family and friends are very negative about being treated for depression or for any emotional illness. Patient is on disability for lupus. She takes medicine for that specifically she takes Coumadin because of a past blood clot. There is any stigma about this condition worse medicines. For depression there is. The patient is taking 50 mg of desipramine and was doing very well as indicated by her last visit. She claims she is doing well when she takes her medicinebut in the last month or so he's not taking it consistently. She herself acknowledges that because she's not taking it she does feel more depressed. On the other hand she continues to take Xanax 1 mg twice a day and Ambien for sleep. She says she doesn't take those medicines she doesn't sleep feels anxious. She is almost illogical about it as she realizes the desipramine is for depression and axial problem this time. The patient is sleeping less. She's eating less and has lost weight. Her energy level is low. She has fleeting suicidal thoughts without any intent. She knows she cannot and her life as her 63 year old son who has autism needs her and lives with her. The patient does have a boyfriend who loves her great deal and tells her she must take her medicine. The patient unfortunately has resistant to this. I don't think this patient is the constipation will go to a support group. Fortunately she has connected with a therapist although she missed her last visit. Patient says she missed it for reasons she cannot explain her for some reason this patient' sabotages her  care. (Hypo) Manic Symptoms:   Anxiety Symptoms:   Psychotic Symptoms:   PTSD Symptoms:   Past Psychiatric History: Past therapy, one psychiatric hospitalization  Previous Psychotropic Medications: Yes   Substance Abuse History in the last 12 months:  No.  Consequences of Substance Abuse: Negative  Past Medical History:  Past Medical History:  Diagnosis Date  . Anxiety   . Depression   . Lupus   . PE (pulmonary embolism)   . PTSD (post-traumatic stress disorder)   . Stroke ([place])   . TIA (transient ischemic attack)     Past Surgical History:  Procedure Laterality Date  . ABDOMINAL SURGERY    . ABLATION      Family Psychiatric History:   Family History:  Family History  Problem Relation Age of Onset  . Leukemia Father   . Seizures Daughter   . Drug abuse Mother   . Alcohol abuse Mother     Social History:   Social History   Socioeconomic History  . Marital status: Legally Separated    Spouse name: None  . Number of children: None  . Years of education: None  . Highest education level: None  Social Needs  . Financial resource strain: None  . Food insecurity - worry: None  . Food insecurity - inability: None  . Transportation needs - medical: None  . Transportation needs - non-medical: None  Occupational History  . None  Tobacco Use  . Smoking status: Never Smoker  . Smokeless tobacco: Never Used  Substance and Sexual Activity  .  Alcohol use: No  . Drug use: No  . Sexual activity: Yes    Partners: Male    Birth control/protection: Condom  Other Topics Concern  . None  Social History Narrative  . None    Additional Social History:   Allergies:   Allergies  Allergen Reactions  . Codeine Shortness Of Breath  . Contrast Media [Iodinated Diagnostic Agents] Shortness Of Breath  . Tylenol [Acetaminophen] Shortness Of Breath    Metabolic Disorder Labs: No results found for: HGBA1C, MPG No results found for: PROLACTIN No results found for:  CHOL, TRIG, HDL, CHOLHDL, VLDL, LDLCALC   Current Medications: Current Outpatient Medications  Medication Sig Dispense Refill  . alprazolam (XANAX) 2 MG tablet Take 1 tablet (2 mg total) by mouth at bedtime as needed for sleep. 30 tablet 2  . desipramine (NORPRAMIN) 25 MG tablet 2 tablets (50 mg) by mouth daily 60 tablet 6  . warfarin (COUMADIN) 5 MG tablet Take 1 tablet (5 mg total) by mouth daily at 6 PM. Adjust dose based on INR with primary care physician in 5 days (Patient taking differently: Take 2.5-5 mg by mouth daily. Take 5mg  by mouth on Monday, Wednesday, and Friday. Take 2.5mg  by mouth on Tuesday, Thursday, Saturday, and Sunday.) 30 tablet 0  . zolpidem (AMBIEN CR) 12.5 MG CR tablet TAKE 12.5 MG BY MOUTH EVERY NIGHT 30 tablet 2  . hydroxychloroquine (PLAQUENIL) 200 MG tablet TAKE 200MG  BY MOUTH TWICE A DAY WITH FOOD OR MILK  3  . zolpidem (AMBIEN CR) 12.5 MG CR tablet Take 1 tablet (12.5 mg total) by mouth at bedtime as needed for sleep. 30 tablet 5   No current facility-administered medications for this visit.     Neurologic: Headache: No Seizure: No Paresthesias:No  Musculoskeletal: Strength & Muscle Tone: within normal limits Gait & Station: normal Patient leans: N/A  Psychiatric Specialty Exam: ROS  Blood pressure 112/68, pulse 90, height 5\' 7"  (1.702 m), weight 162 lb (73.5 kg), SpO2 96 %.Body mass index is 25.37 kg/m.  General Appearance: Casual  Eye Contact:  Good  Speech:  Clear and Coherent  Volume:  Normal  Mood:  Dysphoric  Affect:  Appropriate  Thought Process:  Goal Directed  Orientation:  NA  Thought Content:  Logical  Suicidal Thoughts:  No  Homicidal Thoughts:  No  Memory:  NA  Judgement:  Good  Insight:  Good  Psychomotor Activity:  Normal  Concentration:    Recall:  [place] of Knowledge:Fair  Language: Good  Akathisia:  No  Handed:  Right  AIMS (if indicated):    Assets:  Desire for Improvement  ADL's:  Intact  Cognition: WNL   Sleep:      Treatment Plan Summary: 12/19/20184:08 PM  At this time the patient will restart her desipramine. I will be clear not to give her large dose of desipramine potentially lethal antidepressant at high doses she received 50 mg a day she'll continue taking Xanax essentially 2 mg a day and continue Ambien CR for sleep. The patient is not acutely suicidal. Patient doesn't work. She does very little for enjoyment. Today we had a long discussion about stigma. Talked about all the very famous important people who have major depression. I shared with her that she's not alone and is very right port liters has had clinical depression. I shared with this includes Juanetta Gosling others. The patient finally agreed to take her desipramine and return to see me in one month.

## 2017-11-15 ENCOUNTER — Emergency Department (HOSPITAL_COMMUNITY): Payer: Medicare Other

## 2017-11-15 ENCOUNTER — Inpatient Hospital Stay (HOSPITAL_COMMUNITY)
Admission: EM | Admit: 2017-11-15 | Discharge: 2017-11-20 | DRG: 872 | Disposition: A | Discharge status: Home / Self Care | Payer: Medicare Other | Attending: Internal Medicine | Admitting: Internal Medicine

## 2017-11-15 ENCOUNTER — Encounter (HOSPITAL_COMMUNITY): Payer: Self-pay | Admitting: Emergency Medicine

## 2017-11-15 DIAGNOSIS — D649 Anemia, unspecified: Secondary | ICD-10-CM | POA: Diagnosis not present

## 2017-11-15 DIAGNOSIS — N12 Tubulo-interstitial nephritis, not specified as acute or chronic: Secondary | ICD-10-CM | POA: Diagnosis present

## 2017-11-15 DIAGNOSIS — K2970 Gastritis, unspecified, without bleeding: Secondary | ICD-10-CM | POA: Diagnosis not present

## 2017-11-15 DIAGNOSIS — N179 Acute kidney failure, unspecified: Secondary | ICD-10-CM | POA: Diagnosis not present

## 2017-11-15 DIAGNOSIS — I6930 Unspecified sequelae of cerebral infarction: Secondary | ICD-10-CM | POA: Diagnosis present

## 2017-11-15 DIAGNOSIS — Z885 Allergy status to narcotic agent status: Secondary | ICD-10-CM

## 2017-11-15 DIAGNOSIS — M329 Systemic lupus erythematosus, unspecified: Secondary | ICD-10-CM | POA: Diagnosis present

## 2017-11-15 DIAGNOSIS — Z86711 Personal history of pulmonary embolism: Secondary | ICD-10-CM | POA: Diagnosis present

## 2017-11-15 DIAGNOSIS — Z886 Allergy status to analgesic agent status: Secondary | ICD-10-CM

## 2017-11-15 DIAGNOSIS — E876 Hypokalemia: Secondary | ICD-10-CM | POA: Diagnosis present

## 2017-11-15 DIAGNOSIS — Z91041 Radiographic dye allergy status: Secondary | ICD-10-CM

## 2017-11-15 DIAGNOSIS — R000 Tachycardia, unspecified: Secondary | ICD-10-CM | POA: Diagnosis not present

## 2017-11-15 DIAGNOSIS — R110 Nausea: Secondary | ICD-10-CM | POA: Diagnosis not present

## 2017-11-15 DIAGNOSIS — N10 Acute tubulo-interstitial nephritis: Secondary | ICD-10-CM | POA: Diagnosis present

## 2017-11-15 DIAGNOSIS — R319 Hematuria, unspecified: Secondary | ICD-10-CM | POA: Diagnosis not present

## 2017-11-15 DIAGNOSIS — Z7901 Long term (current) use of anticoagulants: Secondary | ICD-10-CM

## 2017-11-15 DIAGNOSIS — F418 Other specified anxiety disorders: Secondary | ICD-10-CM | POA: Diagnosis present

## 2017-11-15 DIAGNOSIS — N390 Urinary tract infection, site not specified: Secondary | ICD-10-CM | POA: Diagnosis not present

## 2017-11-15 DIAGNOSIS — F4310 Post-traumatic stress disorder, unspecified: Secondary | ICD-10-CM | POA: Diagnosis not present

## 2017-11-15 DIAGNOSIS — R509 Fever, unspecified: Secondary | ICD-10-CM

## 2017-11-15 DIAGNOSIS — F419 Anxiety disorder, unspecified: Secondary | ICD-10-CM | POA: Diagnosis present

## 2017-11-15 DIAGNOSIS — F32A Depression, unspecified: Secondary | ICD-10-CM | POA: Diagnosis present

## 2017-11-15 DIAGNOSIS — I10 Essential (primary) hypertension: Secondary | ICD-10-CM | POA: Diagnosis not present

## 2017-11-15 DIAGNOSIS — A419 Sepsis, unspecified organism: Principal | ICD-10-CM | POA: Diagnosis present

## 2017-11-15 DIAGNOSIS — E860 Dehydration: Secondary | ICD-10-CM | POA: Diagnosis present

## 2017-11-15 DIAGNOSIS — Z8673 Personal history of transient ischemic attack (TIA), and cerebral infarction without residual deficits: Secondary | ICD-10-CM | POA: Diagnosis present

## 2017-11-15 DIAGNOSIS — R6520 Severe sepsis without septic shock: Secondary | ICD-10-CM | POA: Diagnosis not present

## 2017-11-15 LAB — COMPREHENSIVE METABOLIC PANEL
ALK PHOS: 85 U/L (ref 38–126)
ALT: 20 U/L (ref 14–54)
AST: 34 U/L (ref 15–41)
Albumin: 3.3 g/dL — ABNORMAL LOW (ref 3.5–5.0)
Anion gap: 20 — ABNORMAL HIGH (ref 5–15)
BUN: 14 mg/dL (ref 6–20)
CALCIUM: 8.8 mg/dL — AB (ref 8.9–10.3)
CHLORIDE: 97 mmol/L — AB (ref 101–111)
CO2: 18 mmol/L — AB (ref 22–32)
CREATININE: 1.75 mg/dL — AB (ref 0.44–1.00)
GFR calc Af Amer: 38 mL/min — ABNORMAL LOW (ref >60)
GFR calc non Af Amer: 33 mL/min — ABNORMAL LOW (ref >60)
Glucose, Bld: 89 mg/dL (ref 65–99)
Potassium: 3.4 mmol/L — ABNORMAL LOW (ref 3.5–5.1)
SODIUM: 135 mmol/L (ref 135–145)
Total Bilirubin: 2.3 mg/dL — ABNORMAL HIGH (ref 0.3–1.2)
Total Protein: 7.3 g/dL (ref 6.5–8.1)

## 2017-11-15 LAB — I-STAT BETA HCG BLOOD, ED (MC, WL, AP ONLY): I-stat hCG, quantitative: 16.1 m[IU]/mL — ABNORMAL HIGH (ref <5)

## 2017-11-15 LAB — PROTIME-INR
INR: 2.6
PROTHROMBIN TIME: 27.6 s — AB (ref 11.4–15.2)

## 2017-11-15 LAB — I-STAT CG4 LACTIC ACID, ED: Lactic Acid, Venous: 10.51 mmol/L (ref 0.5–1.9)

## 2017-11-15 MED ORDER — VANCOMYCIN HCL IN DEXTROSE 1-5 GM/200ML-% IV SOLN
1000.0000 mg | Freq: Once | INTRAVENOUS | Status: AC
  Administered 2017-11-15: 1000 mg via INTRAVENOUS
  Filled 2017-11-15: qty 200

## 2017-11-15 MED ORDER — IBUPROFEN 400 MG PO TABS
400.0000 mg | ORAL_TABLET | Freq: Once | ORAL | Status: AC
  Administered 2017-11-15: 400 mg via ORAL
  Filled 2017-11-15: qty 1

## 2017-11-15 MED ORDER — SODIUM CHLORIDE 0.9 % IV BOLUS (SEPSIS)
1000.0000 mL | Freq: Once | INTRAVENOUS | Status: AC
  Administered 2017-11-15: 1000 mL via INTRAVENOUS

## 2017-11-15 MED ORDER — PIPERACILLIN-TAZOBACTAM 3.375 G IVPB 30 MIN
3.3750 g | Freq: Once | INTRAVENOUS | Status: AC
  Administered 2017-11-15: 3.375 g via INTRAVENOUS
  Filled 2017-11-15: qty 50

## 2017-11-15 MED ORDER — SODIUM CHLORIDE 0.9 % IV BOLUS (SEPSIS)
250.0000 mL | Freq: Once | INTRAVENOUS | Status: AC
  Administered 2017-11-16: 250 mL via INTRAVENOUS

## 2017-11-15 NOTE — ED Triage Notes (Signed)
Per EMS: Pt c/o Gen Body aches x 4 days. Pain radiating from legs to abdomen. Pt c/o bilateral flank pain. Pt urinating blood. Pt taking tylenol and advil with temp of 102. Pt had syncopal episode. Pt given 400 mL NS. 118/60 HR 130. A&Ox4. Pt has Hx of Lupus and Multiple PE's. Pt on Eliquis.

## 2017-11-15 NOTE — ED Provider Notes (Addendum)
[place] EMERGENCY DEPARTMENT Provider Note   CSN: [id number] Arrival date & time: 11/15/17  2249     History   Chief Complaint Chief Complaint  Patient presents with  . Weakness    HPI Lauren Chung is a 52 y.o. female.  The history is provided by the patient.  She has history of lupus, stroke, depression, pulmonary embolism and comes in with 4-day history of chills and sweats and pain in her flank areas which radiates to the lower abdomen and upper back and into her legs.  She was not aware of fever.  She denies any sick contacts.  She denies nausea or vomiting.  There is been a mild cough which is nonproductive.  She has urinated blood on 2 occasions but denies urinary urgency or frequency or tenesmus.  There has been no constipation or diarrhea.  Past Medical History:  Diagnosis Date  . Anxiety   . Depression   . Lupus   . PE (pulmonary embolism)   . PTSD (post-traumatic stress disorder)   . Stroke ([place])   . TIA (transient ischemic attack)     Patient Active Problem List   Diagnosis Date Noted  . Severe recurrent major depression without psychotic features ([place]) 09/30/2016    Class: Chronic    Past Surgical History:  Procedure Laterality Date  . ABDOMINAL SURGERY    . ABLATION      OB History    No data available       Home Medications    Prior to Admission medications   Medication Sig Start Date End Date Taking? Authorizing Provider  alprazolam Duanne Moron) 2 MG tablet Take 1 tablet (2 mg total) by mouth at bedtime as needed for sleep. 10/21/17   Norma Fredrickson, MD  desipramine (NORPRAMIN) 25 MG tablet 2 tablets (50 mg) by mouth daily 10/21/17   Plovsky, Berneta Sages, MD  hydroxychloroquine (PLAQUENIL) 200 MG tablet TAKE 200MG  BY MOUTH TWICE A DAY WITH FOOD OR MILK 08/07/16   [provider]  warfarin (COUMADIN) 5 MG tablet Take 1 tablet (5 mg total) by mouth daily at 6 PM. Adjust dose based on INR with primary care physician in 5  days Patient taking differently: Take 2.5-5 mg by mouth daily. Take 5mg  by mouth on Monday, Wednesday, and Friday. Take 2.5mg  by mouth on Tuesday, Thursday, Saturday, and Sunday. 08/11/16   Brunetta Genera, MD  zolpidem (AMBIEN CR) 12.5 MG CR tablet Take 1 tablet (12.5 mg total) by mouth at bedtime as needed for sleep. 06/24/17   Norma Fredrickson, MD  zolpidem (AMBIEN CR) 12.5 MG CR tablet TAKE 12.5 MG BY MOUTH EVERY NIGHT 10/21/17   Norma Fredrickson, MD    Family History Family History  Problem Relation Age of Onset  . Leukemia Father   . Seizures Daughter   . Drug abuse Mother   . Alcohol abuse Mother     Social History Social History   Tobacco Use  . Smoking status: Never Smoker  . Smokeless tobacco: Never Used  Substance Use Topics  . Alcohol use: No  . Drug use: No     Allergies   Codeine; Contrast media [iodinated diagnostic agents]; and Tylenol [acetaminophen]   Review of Systems Review of Systems  All other systems reviewed and are negative.    Physical Exam Updated Vital Signs BP 106/80   Pulse (!) 138   Temp (!) 104 F (40 C) (Rectal)   Resp (!) 24   Ht 5\' 3"  (1.6  m)   Wt 72.6 kg (160 lb)   LMP  (LMP Unknown)   SpO2 99%   BMI 28.34 kg/m   Physical Exam  Nursing note and vitals reviewed.  52 year old female, resting comfortably and in no acute distress. Vital signs are significant for fever, tachypnea, tachycardia. Oxygen saturation is 99%, which is normal. Head is normocephalic and atraumatic. PERRLA, EOMI. Oropharynx is clear. Neck is nontender and supple without adenopathy or JVD. Back is nontender and there is no CVA tenderness. Lungs are clear without rales, wheezes, or rhonchi. Chest is nontender. Heart has regular rate and rhythm without murmur. Abdomen is soft, flat, with mild suprapubic tenderness.  There is no rebound or guarding.  There are no masses or hepatosplenomegaly and peristalsis is hypoactive. Extremities have no cyanosis or  edema, full range of motion is present. Skin is warm and dry without rash. Neurologic: Mental status is normal, cranial nerves are intact, there are no motor or sensory deficits.  ED Treatments / Results  Labs (all labs ordered are listed, but only abnormal results are displayed) Labs Reviewed  COMPREHENSIVE METABOLIC PANEL - Abnormal; Notable for the following components:      Result Value   Potassium 3.4 (*)    Chloride 97 (*)    CO2 18 (*)    Creatinine, Ser 1.75 (*)    Calcium 8.8 (*)    Albumin 3.3 (*)    Total Bilirubin 2.3 (*)    GFR calc non Af Amer 33 (*)    GFR calc Af Amer 38 (*)    Anion gap 20 (*)    All other components within normal limits  CBC WITH DIFFERENTIAL/PLATELET - Abnormal; Notable for the following components:   WBC 27.4 (*)    RDW 16.0 (*)    Neutro Abs 23.8 (*)    Monocytes Absolute 2.2 (*)    All other components within normal limits  PROTIME-INR - Abnormal; Notable for the following components:   Prothrombin Time 27.6 (*)    All other components within normal limits  URINALYSIS, ROUTINE W REFLEX MICROSCOPIC - Abnormal; Notable for the following components:   APPearance HAZY (*)    Specific Gravity, Urine 1.004 (*)    Hgb urine dipstick LARGE (*)    Ketones, ur 5 (*)    Leukocytes, UA LARGE (*)    Bacteria, UA MANY (*)    Squamous Epithelial / LPF 0-5 (*)    All other components within normal limits  I-STAT CG4 LACTIC ACID, ED - Abnormal; Notable for the following components:   Lactic Acid, Venous 10.51 (*)    All other components within normal limits  I-STAT BETA HCG BLOOD, ED (MC, WL, AP ONLY) - Abnormal; Notable for the following components:   I-stat hCG, quantitative 16.1 (*)    All other components within normal limits  I-STAT CG4 LACTIC ACID, ED - Abnormal; Notable for the following components:   Lactic Acid, Venous 3.10 (*)    All other components within normal limits  I-STAT BETA HCG BLOOD, ED (MC, [facility], AP ONLY) - Abnormal; Notable  for the following components:   I-stat hCG, quantitative 19.4 (*)    All other components within normal limits  CULTURE, BLOOD (ROUTINE X 2)  CULTURE, BLOOD (ROUTINE X 2)  URINE CULTURE  INFLUENZA PANEL BY PCR (TYPE A & B)  HCG, QUANTITATIVE, PREGNANCY  HCG, SERUM, QUALITATIVE    EKG Interpretation  Date/Time:  Monday November 16 2017 01:49:30 EST Ventricular Rate:  110 PR Interval:    QRS Duration: 98 QT Interval:  376 QTC Calculation: 509 R Axis:   101 Text Interpretation:  Sinus tachycardia Atrial premature complexes Right axis deviation Nonspecific T abnrm, anterolateral leads Borderline prolonged QT interval Low voltage QRS When compared with ECG of 11/24/2016, No significant change was found Confirmed by Delora Fuel ([id number]) on 11/16/2017 2:19:10 AM       Radiology Dg Chest Port 1 View  Result Date: 11/15/2017 CLINICAL DATA:  Fever EXAM: PORTABLE CHEST 1 VIEW COMPARISON:  12/07/2015 chest radiograph. FINDINGS: Stable cardiomediastinal silhouette with normal heart size. No pneumothorax. No pleural effusion. Lungs appear clear, with no acute consolidative airspace disease and no pulmonary edema. IMPRESSION: No active disease. Electronically Signed   By: Ilona Sorrel M.D.   On: 11/15/2017 23:29    Procedures Procedures  CRITICAL CARE Performed by: Delora Fuel Total critical care time: 60 minutes Critical care time was exclusive of separately billable procedures and treating other patients. Critical care was necessary to treat or prevent imminent or life-threatening deterioration. Critical care was time spent personally by me on the following activities: development of treatment plan with patient and/or surrogate as well as nursing, discussions with consultants, evaluation of patient's response to treatment, examination of patient, obtaining history from patient or surrogate, ordering and performing treatments and interventions, ordering and review of laboratory studies, ordering  and review of radiographic studies, pulse oximetry and re-evaluation of patient's condition.  Medications Ordered in ED Medications  cefTRIAXone (ROCEPHIN) 1 g in dextrose 5 % 50 mL IVPB (not administered)  potassium chloride 20 MEQ/15ML (10%) solution 20 mEq (not administered)  0.9 %  sodium chloride infusion (not administered)  sodium chloride 0.9 % bolus 1,000 mL (0 mLs Intravenous Stopped 11/16/17 0021)    And  sodium chloride 0.9 % bolus 1,000 mL (0 mLs Intravenous Stopped 11/16/17 0113)    And  sodium chloride 0.9 % bolus 250 mL ( Intravenous Canceled Entry 11/16/17 0116)  piperacillin-tazobactam (ZOSYN) IVPB 3.375 g (0 g Intravenous Stopped 11/16/17 0021)  vancomycin (VANCOCIN) IVPB 1000 mg/200 mL premix (0 mg Intravenous Stopped 11/16/17 0113)  ibuprofen (ADVIL,MOTRIN) tablet 400 mg (400 mg Oral Given 11/15/17 2352)  morphine 4 MG/ML injection 4 mg (4 mg Intravenous Given 11/16/17 0109)     Initial Impression / Assessment and Plan / ED Course  I have reviewed the triage vital signs and the nursing notes.  Pertinent labs & imaging results that were available during my care of the patient were reviewed by me and considered in my medical decision making (see chart for details).  Fever with tachycardia and somewhat low blood pressure.  Sepsis protocol was initiated and she is started on early goal directed fluid therapy and antibiotics for sepsis of unknown source.  Old records are reviewed, and she has no relevant visits.  On prior visits, blood pressure has ranged from 712 systolic to 458 systolic.  Blood pressure today is 099 systolic, and this is being treated as if it is hypotension from sepsis.  Blood pressure has come up and heart rate has come down with IV fluids.  Initial lactic acid level is very high at 10.5, has come down to 3.1 with fluids.  Urinalysis shows definite evidence of infection is apparently the source of her sepsis.  Acute kidney injury is identified with creatinine of  1.75.  Appropriate antibiotics have been initiated.  Influenza swab is negative.  Case is discussed with Dr. Blaine Hamper of [facility], who agrees  to admit the patient.  Final Clinical Impressions(s) / ED Diagnoses   Final diagnoses:  Urinary tract infection with hematuria, site unspecified  Sepsis, due to unspecified organism [facility])  Acute kidney injury (nontraumatic) [facility])    ED Discharge Orders    None       Delora Fuel, MD 66/59/93 [id number]    Delora Fuel, MD 77/93/90 [phone]

## 2017-11-15 NOTE — ED Notes (Signed)
CODE SEPSIS ACTIVATED RN CASEY AWARE

## 2017-11-15 NOTE — Progress Notes (Signed)
Pharmacy Antibiotic Note  Lauren Chung is a 52 y.o. female admitted on 11/15/2017 with sepsis.  Pharmacy has been consulted for vancomycin and Zosyn dosing. Patient received one dose of, Tmax  ncomycin and zosyn in ED. WBC 27.4, LA 10.5, sCr 1.75 (baseline~1.0) CrCl ~ 36 mL/min  Plan: Vancomycin 1000mg  IV every 24 hours.  Goal trough 15-20 mcg/mL. Zosyn 3.375g IV q8h (4 hour infusion). Monitor clinical progression and LOT  Height: 5\' 3"  (160 cm) Weight: 160 lb (72.6 kg) IBW/kg (Calculated) : 52.4  Temp (24hrs), Avg:104 F (40 C), Min:104 F (40 C), Max:104 F (40 C)  Recent Labs  Lab 11/15/17 2309 11/15/17 2321  WBC 27.4*  --   LATICACIDVEN  --  10.51*    CrCl cannot be calculated (Patient's most recent lab result is older than the maximum 21 days allowed.).    Allergies  Allergen Reactions  . Codeine Shortness Of Breath  . Contrast Media [Iodinated Diagnostic Agents] Shortness Of Breath  . Tylenol [Acetaminophen] Shortness Of Breath    Thank you for allowing pharmacy to be a part of this patient's care.  Jodean Lima Ventura Leggitt 11/15/2017 11:58 PM

## 2017-11-16 ENCOUNTER — Encounter (HOSPITAL_COMMUNITY): Payer: Self-pay | Admitting: Internal Medicine

## 2017-11-16 ENCOUNTER — Other Ambulatory Visit: Payer: Self-pay

## 2017-11-16 ENCOUNTER — Inpatient Hospital Stay (HOSPITAL_COMMUNITY): Payer: Medicare Other

## 2017-11-16 DIAGNOSIS — N10 Acute tubulo-interstitial nephritis: Secondary | ICD-10-CM | POA: Diagnosis present

## 2017-11-16 DIAGNOSIS — Z886 Allergy status to analgesic agent status: Secondary | ICD-10-CM | POA: Diagnosis not present

## 2017-11-16 DIAGNOSIS — N12 Tubulo-interstitial nephritis, not specified as acute or chronic: Secondary | ICD-10-CM | POA: Diagnosis not present

## 2017-11-16 DIAGNOSIS — E876 Hypokalemia: Secondary | ICD-10-CM | POA: Diagnosis present

## 2017-11-16 DIAGNOSIS — F418 Other specified anxiety disorders: Secondary | ICD-10-CM | POA: Diagnosis not present

## 2017-11-16 DIAGNOSIS — Z91041 Radiographic dye allergy status: Secondary | ICD-10-CM | POA: Diagnosis not present

## 2017-11-16 DIAGNOSIS — N179 Acute kidney failure, unspecified: Secondary | ICD-10-CM | POA: Diagnosis not present

## 2017-11-16 DIAGNOSIS — I639 Cerebral infarction, unspecified: Secondary | ICD-10-CM | POA: Diagnosis not present

## 2017-11-16 DIAGNOSIS — I2699 Other pulmonary embolism without acute cor pulmonale: Secondary | ICD-10-CM | POA: Diagnosis not present

## 2017-11-16 DIAGNOSIS — Z885 Allergy status to narcotic agent status: Secondary | ICD-10-CM | POA: Diagnosis not present

## 2017-11-16 DIAGNOSIS — A419 Sepsis, unspecified organism: Secondary | ICD-10-CM | POA: Diagnosis present

## 2017-11-16 DIAGNOSIS — R109 Unspecified abdominal pain: Secondary | ICD-10-CM | POA: Diagnosis not present

## 2017-11-16 DIAGNOSIS — R319 Hematuria, unspecified: Secondary | ICD-10-CM | POA: Diagnosis not present

## 2017-11-16 DIAGNOSIS — N390 Urinary tract infection, site not specified: Secondary | ICD-10-CM | POA: Diagnosis not present

## 2017-11-16 DIAGNOSIS — D649 Anemia, unspecified: Secondary | ICD-10-CM | POA: Diagnosis not present

## 2017-11-16 DIAGNOSIS — I10 Essential (primary) hypertension: Secondary | ICD-10-CM | POA: Diagnosis not present

## 2017-11-16 DIAGNOSIS — I6930 Unspecified sequelae of cerebral infarction: Secondary | ICD-10-CM | POA: Diagnosis present

## 2017-11-16 DIAGNOSIS — E860 Dehydration: Secondary | ICD-10-CM | POA: Diagnosis present

## 2017-11-16 DIAGNOSIS — Z7901 Long term (current) use of anticoagulants: Secondary | ICD-10-CM | POA: Diagnosis not present

## 2017-11-16 DIAGNOSIS — Z86711 Personal history of pulmonary embolism: Secondary | ICD-10-CM | POA: Diagnosis not present

## 2017-11-16 DIAGNOSIS — R509 Fever, unspecified: Secondary | ICD-10-CM | POA: Diagnosis not present

## 2017-11-16 DIAGNOSIS — M329 Systemic lupus erythematosus, unspecified: Secondary | ICD-10-CM | POA: Diagnosis present

## 2017-11-16 DIAGNOSIS — F4310 Post-traumatic stress disorder, unspecified: Secondary | ICD-10-CM | POA: Diagnosis present

## 2017-11-16 DIAGNOSIS — Z8673 Personal history of transient ischemic attack (TIA), and cerebral infarction without residual deficits: Secondary | ICD-10-CM | POA: Diagnosis not present

## 2017-11-16 DIAGNOSIS — F32A Depression, unspecified: Secondary | ICD-10-CM | POA: Diagnosis present

## 2017-11-16 LAB — BASIC METABOLIC PANEL
ANION GAP: 10 (ref 5–15)
ANION GAP: 10 (ref 5–15)
BUN: 11 mg/dL (ref 6–20)
BUN: 9 mg/dL (ref 6–20)
CALCIUM: 7.3 mg/dL — AB (ref 8.9–10.3)
CALCIUM: 7.7 mg/dL — AB (ref 8.9–10.3)
CO2: 20 mmol/L — ABNORMAL LOW (ref 22–32)
CO2: 19 mmol/L — ABNORMAL LOW (ref 22–32)
Chloride: 105 mmol/L (ref 101–111)
Chloride: 105 mmol/L (ref 101–111)
Creatinine, Ser: 1.44 mg/dL — ABNORMAL HIGH (ref 0.44–1.00)
Creatinine, Ser: 1.25 mg/dL — ABNORMAL HIGH (ref 0.44–1.00)
GFR calc Af Amer: 48 mL/min — ABNORMAL LOW (ref >60)
GFR calc Af Amer: 57 mL/min — ABNORMAL LOW (ref >60)
GFR, EST NON AFRICAN AMERICAN: 42 mL/min — AB (ref >60)
GFR, EST NON AFRICAN AMERICAN: 49 mL/min — AB (ref >60)
GLUCOSE: 96 mg/dL (ref 65–99)
Glucose, Bld: 93 mg/dL (ref 65–99)
POTASSIUM: 2.5 mmol/L — AB (ref 3.5–5.1)
POTASSIUM: 3.6 mmol/L (ref 3.5–5.1)
SODIUM: 135 mmol/L (ref 135–145)
SODIUM: 134 mmol/L — AB (ref 135–145)

## 2017-11-16 LAB — CBC
HEMATOCRIT: 30.9 % — AB (ref 36.0–46.0)
HEMOGLOBIN: 9.9 g/dL — AB (ref 12.0–15.0)
MCH: 26 pg (ref 26.0–34.0)
MCHC: 32 g/dL (ref 30.0–36.0)
MCV: 81.1 fL (ref 78.0–100.0)
Platelets: 269 10*3/uL (ref 150–400)
RBC: 3.81 MIL/uL — ABNORMAL LOW (ref 3.87–5.11)
RDW: 16.1 % — AB (ref 11.5–15.5)
WBC: 18.5 10*3/uL — AB (ref 4.0–10.5)

## 2017-11-16 LAB — I-STAT CG4 LACTIC ACID, ED: LACTIC ACID, VENOUS: 3.1 mmol/L — AB (ref 0.5–1.9)

## 2017-11-16 LAB — I-STAT BETA HCG BLOOD, ED (MC, WL, AP ONLY): I-stat hCG, quantitative: 19.4 m[IU]/mL — ABNORMAL HIGH (ref <5)

## 2017-11-16 LAB — CBC WITH DIFFERENTIAL/PLATELET
BASOS ABS: 0 10*3/uL (ref 0.0–0.1)
BASOS PCT: 0 %
EOS PCT: 0 %
Eosinophils Absolute: 0 10*3/uL (ref 0.0–0.7)
HCT: 36.8 % (ref 36.0–46.0)
HEMOGLOBIN: 12.1 g/dL (ref 12.0–15.0)
LYMPHS ABS: 1.4 10*3/uL (ref 0.7–4.0)
LYMPHS PCT: 5 %
MCH: 27.2 pg (ref 26.0–34.0)
MCHC: 32.9 g/dL (ref 30.0–36.0)
MCV: 82.7 fL (ref 78.0–100.0)
MONOS PCT: 8 %
Monocytes Absolute: 2.2 10*3/uL — ABNORMAL HIGH (ref 0.1–1.0)
NEUTROS ABS: 23.8 10*3/uL — AB (ref 1.7–7.7)
Neutrophils Relative %: 87 %
Platelets: 324 10*3/uL (ref 150–400)
RBC: 4.45 MIL/uL (ref 3.87–5.11)
RDW: 16 % — AB (ref 11.5–15.5)
WBC: 27.4 10*3/uL — ABNORMAL HIGH (ref 4.0–10.5)

## 2017-11-16 LAB — URINALYSIS, ROUTINE W REFLEX MICROSCOPIC
BILIRUBIN URINE: NEGATIVE
Glucose, UA: NEGATIVE mg/dL
Ketones, ur: 5 mg/dL — AB
NITRITE: NEGATIVE
Protein, ur: NEGATIVE mg/dL
SPECIFIC GRAVITY, URINE: 1.004 — AB (ref 1.005–1.030)
pH: 7 (ref 5.0–8.0)

## 2017-11-16 LAB — INFLUENZA PANEL BY PCR (TYPE A & B)
INFLAPCR: NEGATIVE
Influenza B By PCR: NEGATIVE

## 2017-11-16 LAB — LACTIC ACID, PLASMA
LACTIC ACID, VENOUS: 2 mmol/L — AB (ref 0.5–1.9)
LACTIC ACID, VENOUS: 0.7 mmol/L (ref 0.5–1.9)

## 2017-11-16 LAB — PROTIME-INR
INR: 3.05
INR: 3.57
Prothrombin Time: 31.3 seconds — ABNORMAL HIGH (ref 11.4–15.2)
Prothrombin Time: 35.4 seconds — ABNORMAL HIGH (ref 11.4–15.2)

## 2017-11-16 LAB — PROCALCITONIN: PROCALCITONIN: 0.57 ng/mL

## 2017-11-16 LAB — HCG, QUANTITATIVE, PREGNANCY: hCG, Beta Chain, Quant, S: 7 m[IU]/mL — ABNORMAL HIGH (ref <5)

## 2017-11-16 LAB — HIV ANTIBODY (ROUTINE TESTING W REFLEX): HIV SCREEN 4TH GENERATION: NONREACTIVE

## 2017-11-16 LAB — PREGNANCY, URINE: PREG TEST UR: NEGATIVE

## 2017-11-16 LAB — CREATININE, URINE, RANDOM: Creatinine, Urine: 40.33 mg/dL

## 2017-11-16 MED ORDER — SENNOSIDES-DOCUSATE SODIUM 8.6-50 MG PO TABS: 1.0000 | ORAL_TABLET | Freq: Every evening | ORAL | Status: DC | PRN

## 2017-11-16 MED ORDER — HYDRALAZINE HCL 20 MG/ML IJ SOLN: 5.0000 mg | INTRAMUSCULAR | Status: DC | PRN

## 2017-11-16 MED ORDER — SODIUM CHLORIDE 0.9 % IV SOLN
INTRAVENOUS | Status: DC
  Administered 2017-11-16 – 2017-11-20 (×10): via INTRAVENOUS

## 2017-11-16 MED ORDER — VANCOMYCIN HCL IN DEXTROSE 1-5 GM/200ML-% IV SOLN: 1000.0000 mg | INTRAVENOUS | Status: DC

## 2017-11-16 MED ORDER — MORPHINE SULFATE (PF) 4 MG/ML IV SOLN
4.0000 mg | Freq: Once | INTRAVENOUS | Status: AC
  Administered 2017-11-16: 4 mg via INTRAVENOUS
  Filled 2017-11-16: qty 1

## 2017-11-16 MED ORDER — ALPRAZOLAM 0.25 MG PO TABS: 2.0000 mg | ORAL_TABLET | Freq: Every evening | ORAL | Status: DC | PRN

## 2017-11-16 MED ORDER — DESIPRAMINE HCL 50 MG PO TABS
50.0000 mg | ORAL_TABLET | Freq: Every day | ORAL | Status: DC
  Filled 2017-11-16: qty 1

## 2017-11-16 MED ORDER — MAGNESIUM SULFATE IN D5W 1-5 GM/100ML-% IV SOLN
1.0000 g | Freq: Once | INTRAVENOUS | Status: AC
  Administered 2017-11-16: 1 g via INTRAVENOUS
  Filled 2017-11-16: qty 100

## 2017-11-16 MED ORDER — IBUPROFEN 400 MG PO TABS
400.0000 mg | ORAL_TABLET | Freq: Three times a day (TID) | ORAL | Status: DC | PRN
  Administered 2017-11-16 – 2017-11-17 (×2): 400 mg via ORAL
  Filled 2017-11-16 (×2): qty 1

## 2017-11-16 MED ORDER — ENSURE ENLIVE PO LIQD
237.0000 mL | Freq: Two times a day (BID) | ORAL | Status: DC
  Administered 2017-11-16: 237 mL via ORAL

## 2017-11-16 MED ORDER — WARFARIN - PHARMACIST DOSING INPATIENT: Freq: Every day | Status: DC

## 2017-11-16 MED ORDER — POTASSIUM CHLORIDE 10 MEQ/100ML IV SOLN
10.0000 meq | INTRAVENOUS | Status: AC
  Administered 2017-11-16 (×2): 10 meq via INTRAVENOUS
  Filled 2017-11-16: qty 100

## 2017-11-16 MED ORDER — POTASSIUM CHLORIDE CRYS ER 20 MEQ PO TBCR
60.0000 meq | EXTENDED_RELEASE_TABLET | Freq: Once | ORAL | Status: AC
  Administered 2017-11-16: 60 meq via ORAL
  Filled 2017-11-16: qty 3

## 2017-11-16 MED ORDER — POTASSIUM CHLORIDE 20 MEQ/15ML (10%) PO SOLN
20.0000 meq | Freq: Once | ORAL | Status: AC
  Administered 2017-11-16: 20 meq via ORAL
  Filled 2017-11-16: qty 15

## 2017-11-16 MED ORDER — SODIUM CHLORIDE 0.9 % IV BOLUS (SEPSIS)
1000.0000 mL | Freq: Once | INTRAVENOUS | Status: AC
  Administered 2017-11-16: 1000 mL via INTRAVENOUS

## 2017-11-16 MED ORDER — INFLUENZA VAC SPLIT QUAD 0.5 ML IM SUSY
0.5000 mL | PREFILLED_SYRINGE | INTRAMUSCULAR | Status: DC
  Filled 2017-11-16: qty 0.5

## 2017-11-16 MED ORDER — METOPROLOL TARTRATE 5 MG/5ML IV SOLN
5.0000 mg | Freq: Once | INTRAVENOUS | Status: AC
Start: 2017-11-16 — End: 2017-11-16
  Administered 2017-11-16: 5 mg via INTRAVENOUS
  Filled 2017-11-16: qty 5

## 2017-11-16 MED ORDER — ONDANSETRON HCL 4 MG/2ML IJ SOLN
4.0000 mg | Freq: Three times a day (TID) | INTRAMUSCULAR | Status: DC | PRN
  Administered 2017-11-16: 4 mg via INTRAVENOUS
  Filled 2017-11-16: qty 2

## 2017-11-16 MED ORDER — MORPHINE SULFATE (PF) 4 MG/ML IV SOLN: 2.0000 mg | INTRAVENOUS | Status: DC | PRN

## 2017-11-16 MED ORDER — PIPERACILLIN-TAZOBACTAM 3.375 G IVPB: 3.3750 g | Freq: Three times a day (TID) | INTRAVENOUS | Status: DC

## 2017-11-16 MED ORDER — DEXTROSE 5 % IV SOLN
1.0000 g | INTRAVENOUS | Status: DC
  Administered 2017-11-16 – 2017-11-20 (×5): 1 g via INTRAVENOUS
  Filled 2017-11-16 (×5): qty 10

## 2017-11-16 MED ORDER — POTASSIUM CHLORIDE CRYS ER 20 MEQ PO TBCR: 40.0000 meq | EXTENDED_RELEASE_TABLET | Freq: Once | ORAL | Status: DC

## 2017-11-16 MED ORDER — ZOLPIDEM TARTRATE 5 MG PO TABS: 5.0000 mg | ORAL_TABLET | Freq: Every evening | ORAL | Status: DC | PRN

## 2017-11-16 MED ORDER — WARFARIN SODIUM 5 MG PO TABS: 5.0000 mg | ORAL_TABLET | Freq: Once | ORAL | Status: DC

## 2017-11-16 MED ORDER — OXYCODONE HCL 5 MG PO TABS
5.0000 mg | ORAL_TABLET | ORAL | Status: DC | PRN
  Administered 2017-11-19: 5 mg via ORAL
  Filled 2017-11-16: qty 1

## 2017-11-16 NOTE — ED Notes (Signed)
Dr Blaine Hamper notified pts BP trending down, orders obtained for fluid bolus

## 2017-11-16 NOTE — ED Notes (Signed)
Report given to 5W RN

## 2017-11-16 NOTE — Progress Notes (Signed)
Pt's BP is 94/56. MD Dhungel made aware. Will continue to assess.

## 2017-11-16 NOTE — ED Notes (Signed)
Blood CX drawn prior to ABX

## 2017-11-16 NOTE — ED Notes (Signed)
Admitting MD made aware of patient HR 120-*130

## 2017-11-16 NOTE — ED Notes (Signed)
Admitting at bedside 

## 2017-11-16 NOTE — Progress Notes (Signed)
ANTICOAGULATION CONSULT NOTE - Initial Consult  Pharmacy Consult for warfarin Indication: history of PE  Allergies  Allergen Reactions  . Codeine Shortness Of Breath  . Contrast Media [Iodinated Diagnostic Agents] Shortness Of Breath  . Tylenol [Acetaminophen] Shortness Of Breath    Patient Measurements: Height: 5\' 3"  (160 cm) Weight: 160 lb (72.6 kg) IBW/kg (Calculated) : 52.4   Vital Signs: Temp: 104 F (40 C) (01/13 2306) Temp Source: Rectal (01/13 2306) BP: 119/74 (01/14 0045) Pulse Rate: 138 (01/13 2302)  Labs: Recent Labs    11/15/17 2309  HGB 12.1  HCT 36.8  PLT 324  LABPROT 27.6*  INR 2.60  CREATININE 1.75*    Estimated Creatinine Clearance: 36.7 mL/min (A) (by C-G formula based on SCr of 1.75 mg/dL (H)).   Medical History: Past Medical History:  Diagnosis Date  . Anxiety   . Depression   . Lupus   . PE (pulmonary embolism)   . PTSD (post-traumatic stress disorder)   . Stroke ([place])   . TIA (transient ischemic attack)     Assessment: 52 yo female admitted with a couple days fevers and pain in her flank area. Antibiotics were started for a code sepsis. She is also on warfarin PTA for a prior PTA.  PTA warfarin: 5 mg on Mon/Wed/Fri, 2.5 on all other days  Goal of Therapy:  INR 2-3 Monitor platelets by anticoagulation protocol: Yes    Plan:  -Warfarin 5 mg po x1 -Daily INR   Harvel Quale 11/16/2017,1:41 AM

## 2017-11-16 NOTE — Progress Notes (Signed)
PROGRESS NOTE                                                                                                                                                                                                             Patient Demographics:    Lauren Chung, is a 52 y.o. female, DOB - Apr 01, 1966, ZOX:[id number]  Admit date - 11/15/2017   Admitting Physician Ivor Costa, MD  Outpatient Primary MD for the patient is [facility] physicians LOS - 0  Outpatient Specialists: None  Chief Complaint  Patient presents with  . Weakness       Brief Narrative   52 year old female with history of hypertension, stroke, anxiety and depression, PTSD, PE on Coumadin, lupus presented with bilateral flank pain with hematuria, fevers and chills with nausea for almost 4 days. Patient found to be septic with fever of 104F, tachycardia, tachypnea, positive UA, lactic acidosis, hypokalemia and acute kidney injury.   Subjective:   Patient denies further hematuria. Flank pain better. Afebrile this morning.   Assessment  & Plan :    Principal Problem:   Sepsis ([place]) Secondary to UTI/acute pyelonephritis. Continue aggressive IV hydration along with IV Rocephin. Follow urine and blood culture. Pain control with when necessary morphine and oxycodone for pain. Has low-grade fever currently. Monitor CBC closely.   Acute pyelonephritis As outlined above. Continue empiric antibiotics, IV hydration and pain medication. Renal ultrasound negative for stones or hydronephrosis.  Active Problems: Hypokalemia (2.5) Replenish  aggressively. Monitor on telemetry. Recheck in PM. Follow magnesium.    AKI (acute kidney injury) ([place]) Secondary to dehydration and sepsis. Monitor with IV fluids. Avoid nephrotoxins.  History of pulmonary embolism On chronic Coumadin. Switch to Lovenox as beta-hCG positive for pregnancy.  Positive bHCG for pregnancy Patient reports  being monogamous and her boyfriend has had vasectomy several years back. Reports having irregular menstrual cycle with last period 6 weeks back. Serum beta-hCG mildly elevated. Will repeat in 72 hours to see for progressive evaluation. Check urine for pregnancy as well. Switch Coumadin to Lovenox until then and avoid any teratogenic agents.      Depression with anxiety Hold benzos until pregnancy truly ruled out.     History of stroke Not on any medication.    Code Status : Full code  Family Communication  none at bedside  Disposition Plan  :  Home once improved, possibly in the next 48 hours  Barriers For Discharge : Active symptoms  Consults  :  None  Procedures  : Renal ultrasound  DVT Prophylaxis  : Therapeutic Lovenox  Lab Results  Component Value Date   PLT 269 11/16/2017    Antibiotics  :    Anti-infectives (From admission, onward)   Start     Dose/Rate Route Frequency Ordered Stop   11/17/17 0800  vancomycin (VANCOCIN) IVPB 1000 mg/200 mL premix  Status:  Discontinued     1,000 mg 200 mL/hr over 60 Minutes Intravenous Every 24 hours 11/16/17 0005 11/16/17 0121   11/17/17 0600  piperacillin-tazobactam (ZOSYN) IVPB 3.375 g  Status:  Discontinued     3.375 g 12.5 mL/hr over 240 Minutes Intravenous Every 8 hours 11/16/17 0005 11/16/17 0121   11/16/17 0800  cefTRIAXone (ROCEPHIN) 1 g in dextrose 5 % 50 mL IVPB     1 g 100 mL/hr over 30 Minutes Intravenous Every 24 hours 11/16/17 0122     11/15/17 2315  piperacillin-tazobactam (ZOSYN) IVPB 3.375 g     3.375 g 100 mL/hr over 30 Minutes Intravenous  Once 11/15/17 2314 11/16/17 0021   11/15/17 2315  vancomycin (VANCOCIN) IVPB 1000 mg/200 mL premix     1,000 mg 200 mL/hr over 60 Minutes Intravenous  Once 11/15/17 2314 11/16/17 0113        Objective:   Vitals:   11/16/17 0930 11/16/17 0945 11/16/17 1000 11/16/17 1005  BP: 111/79 108/66 130/74   Pulse:      Resp: 17 17 (!) 21   Temp:    100.3 F (37.9 C)    TempSrc:    Rectal  SpO2:      Weight:      Height:        Wt Readings from Last 3 Encounters:  11/15/17 72.6 kg (160 lb)  11/24/16 68.9 kg (152 lb)  08/11/16 78.7 kg (173 lb 6.4 oz)    No intake or output data in the 24 hours ending 11/16/17 1044   Physical Exam  Gen: not in distress fatigue HEENT: Pallor present, moist mucosa, supple neck Chest: clear b/l, no added sounds CVS: N S1&S2, no murmurs, rubs or gallop GI: soft, NT, ND, BS+, bilateral CVA tenderness Musculoskeletal: warm, no edema     Data Review:    CBC Recent Labs  Lab 11/15/17 2309 11/16/17 0428  WBC 27.4* 18.5*  HGB 12.1 9.9*  HCT 36.8 30.9*  PLT 324 269  MCV 82.7 81.1  MCH 27.2 26.0  MCHC 32.9 32.0  RDW 16.0* 16.1*  LYMPHSABS 1.4  --   MONOABS 2.2*  --   EOSABS 0.0  --   BASOSABS 0.0  --     Chemistries  Recent Labs  Lab 11/15/17 2309 11/16/17 0428  NA 135 135  K 3.4* 2.5*  CL 97* 105  CO2 18* 20*  GLUCOSE 89 93  BUN 14 11  CREATININE 1.75* 1.44*  CALCIUM 8.8* 7.3*  AST 34  --   ALT 20  --   ALKPHOS 85  --   BILITOT 2.3*  --    ------------------------------------------------------------------------------------------------------------------ No results for input(s): CHOL, HDL, LDLCALC, TRIG, CHOLHDL, LDLDIRECT in the last 72 hours.  No results found for: HGBA1C ------------------------------------------------------------------------------------------------------------------ No results for input(s): TSH, T4TOTAL, T3FREE, THYROIDAB in the last 72 hours.  Invalid input(s): FREET3 ------------------------------------------------------------------------------------------------------------------ No results for input(s): VITAMINB12, FOLATE, FERRITIN, TIBC, IRON, RETICCTPCT in the last 72 hours.  Coagulation profile Recent  Labs  Lab 11/15/17 2309 11/16/17 0428  INR 2.60 3.05    No results for input(s): DDIMER in the last 72 hours.  Cardiac Enzymes No results for  input(s): CKMB, TROPONINI, MYOGLOBIN in the last 168 hours.  Invalid input(s): CK ------------------------------------------------------------------------------------------------------------------ No results found for: BNP  Inpatient Medications  Scheduled Meds: Continuous Infusions: . sodium chloride 150 mL/hr at 11/16/17 0157  . cefTRIAXone (ROCEPHIN)  IV 1 g (11/16/17 1010)   PRN Meds:.hydrALAZINE, morphine injection, ondansetron (ZOFRAN) IV, oxyCODONE, senna-docusate  Micro Results No results found for this or any previous visit (from the past 240 hour(s)).  Radiology Reports US Renal  Result Date: 11/16/2017 CLINICAL DATA:  Acute kidney injury.  Pyelonephritis. EXAM: RENAL / URINARY TRACT ULTRASOUND COMPLETE COMPARISON:  None. FINDINGS: Right Kidney: Length: 10.6 cm. Mildly echogenic right renal parenchyma. Normal right renal parenchymal thickness and right renal size. No right hydronephrosis. No right renal mass. Left Kidney: Length: 10.6 cm. Mildly echogenic left renal parenchyma. Normal left renal parenchymal thickness and left renal size. No left hydronephrosis. No left renal mass. Bladder: Appears normal for degree of bladder distention. IMPRESSION: 1. No hydronephrosis. 2. Mildly echogenic normal size kidneys, indicative of nonspecific renal parenchymal disease of uncertain chronicity. 3. Normal bladder. Electronically Signed   By: Ilona Sorrel M.D.   On: 11/16/2017 02:42   Dg Chest Port 1 View  Result Date: 11/15/2017 CLINICAL DATA:  Fever EXAM: PORTABLE CHEST 1 VIEW COMPARISON:  12/07/2015 chest radiograph. FINDINGS: Stable cardiomediastinal silhouette with normal heart size. No pneumothorax. No pleural effusion. Lungs appear clear, with no acute consolidative airspace disease and no pulmonary edema. IMPRESSION: No active disease. Electronically Signed   By: Ilona Sorrel M.D.   On: 11/15/2017 23:29    Time Spent in minutes  20   Wisdom Rickey M.D on 11/16/2017 at 10:44  AM  Between 7am to 7pm - Pager - [phone]  After 7pm go to [email or web address] - password [facility]  [facility] -  Office  [phone]

## 2017-11-16 NOTE — ED Notes (Signed)
Patient placed on bedpan.

## 2017-11-16 NOTE — Progress Notes (Addendum)
Lauren Chung is a 52 y.o. female patient admitted from ED awake, alert - oriented  X 4 - no acute distress noted.  VSS - Blood pressure 118/79, pulse (!) 110, temperature 100.3 F (37.9 C), temperature source Rectal, resp. rate (!) 22, height 5\' 3"  (1.6 m), weight 72.6 kg (160 lb), SpO2 98 %.    IV in place, occlusive dsg intact without redness.  Orientation to room, and floor completed with information packet given to patient/family.  Patient declined safety video at this time.  Admission INP armband ID verified with patient/family, and in place.   SR up x 2, fall assessment complete, with patient and family able to verbalize understanding of risk associated with falls, and verbalized understanding to call nsg before up out of bed.  Call light within reach, patient able to voice, and demonstrate understanding.  Skin, clean-dry- intact without evidence of bruising, or skin tears. Pt does have a bug bite on her right arm that has been slow to heal. No evidence of skin break down noted on exam.     Will cont to eval and treat per MD orders.  Celine Ahr, RN 11/16/2017 1:23 PM

## 2017-11-16 NOTE — ED Notes (Signed)
Spoke with admitting about patient request for Motrin.

## 2017-11-16 NOTE — Progress Notes (Addendum)
ANTICOAGULATION CONSULT NOTE - Initial Consult  Pharmacy Consult for Enoxaparin Indication: Hx of PE  Allergies  Allergen Reactions  . Codeine Shortness Of Breath  . Contrast Media [Iodinated Diagnostic Agents] Shortness Of Breath  . Tylenol [Acetaminophen] Shortness Of Breath    Patient Measurements: Height: 5\' 3"  (160 cm) Weight: 160 lb (72.6 kg) IBW/kg (Calculated) : 52.4  Vital Signs: Temp: 98.7 F (37.1 C) (01/14 0715) Temp Source: Oral (01/14 0715) BP: 90/64 (01/14 0845) Pulse Rate: 101 (01/14 0845)  Labs: Recent Labs    11/15/17 2309 11/16/17 0428  HGB 12.1 9.9*  HCT 36.8 30.9*  PLT 324 269  LABPROT 27.6* 31.3*  INR 2.60 3.05  CREATININE 1.75* 1.44*    Estimated Creatinine Clearance: 44.6 mL/min (A) (by C-G formula based on SCr of 1.44 mg/dL (H)).   Medical History: Past Medical History:  Diagnosis Date  . Anxiety   . Depression   . Lupus   . PE (pulmonary embolism)   . PTSD (post-traumatic stress disorder)   . Stroke ([place])   . TIA (transient ischemic attack)     Medications:  Warfarin PTA  Assessment: 52 year old female admitted with fever and flank pain who was on warfarin PTA for hx PE and stroke. Patient was found to have positive hCG pregnancy test in ED and warfarin was discontinued. Pharmacy has been consulted to start Lovenox.  SCr was elevated on admission at 1.75, now down to 1.44 with estimated CrCl ~45 mL/min. INR 2.60 on admission, up to 3.05 this AM. Hgb down this AM (12.1>>9.9), Platelets are within normal limits.  Goal of Therapy:  Anti-Xa level 0.6-1 units/ml 4hrs after LMWH dose given Monitor platelets by anticoagulation protocol: Yes   Plan:  Await INR <2 to start Lovenox Monitor renal function closely INR every 12 hours for now.   Sloan Leiter, PharmD, BCPS, BCCCP Clinical Pharmacist Clinical phone 11/16/2017 until 3:30PM [phone] After hours, please call [phone] 11/16/2017,9:03 AM

## 2017-11-16 NOTE — Progress Notes (Signed)
Received report on pt.

## 2017-11-16 NOTE — H&P (Addendum)
History and Physical    Lauren Chung NGE:[id number] DOB: 11-Dec-1965 DOA: 11/15/2017  Referring MD/NP/PA:   PCP: Eloise Levels, NP (Inactive)   Patient coming from:  The patient is coming from home.  At baseline, pt is independent for most of ADL.   Chief Complaint: Fever, chills, bilateral flank pain, hematuria  HPI: Lauren Chung is a 52 y.o. female with medical history significant of hypertension, stroke, TIA, depression with anxiety, PTSD, PE on Coumadin, lupus, who presents with fever, chills, bilateral flank pain and hematuria.  Patient states that she has been having fever and chills for more than 4 days. She also has bilateral flank pain, which is constant, sharp, 9 out of 10 in severity, radiating up to "her head". She noticed blood in her urine, but no dysuria, burning on urination or urinary frequency. Patient has nausea, but no vomiting, diarrhea or abdominal pain. Patient has some mild dry cough, but no shortness of breath, chest pain, runny nose or sore throat.  ED Course: pt was found to have positive urinalysis with UTI, lactic acid 10.51, 0.10, INR 2.60, positive pregnancy test, negative flu pcr, potassium 3.4, acute renal injury with creatinine 1.75, temperature 104, tachycardia, tachypnea, oxygen saturation 99% on room air, negative chest x-ray. Patient is admitted to telemetry bed as inpatient.  Review of Systems:   General: has fevers, chills, no body weight gain, has poor appetite, has fatigue HEENT: no blurry vision, hearing changes or sore throat Respiratory: no dyspnea, has coughing, no wheezing CV: no chest pain, no palpitations GI: has nausea, no vomiting, abdominal pain, diarrhea, constipation GU: no dysuria, burning on urination, increased urinary frequency, has hematuria  Ext: no leg edema Neuro: no unilateral weakness, numbness, or tingling, no vision change or hearing loss Skin: no rash, no skin tear. MSK: has bilateral flank pain. Heme: No easy  bruising.  Travel history: No recent long distant travel.  Allergy:  Allergies  Allergen Reactions  . Codeine Shortness Of Breath  . Contrast Media [Iodinated Diagnostic Agents] Shortness Of Breath  . Tylenol [Acetaminophen] Shortness Of Breath    Past Medical History:  Diagnosis Date  . Anxiety   . Depression   . Lupus   . PE (pulmonary embolism)   . PTSD (post-traumatic stress disorder)   . Stroke ([place])   . TIA (transient ischemic attack)     Past Surgical History:  Procedure Laterality Date  . ABDOMINAL SURGERY    . ABLATION      Social History:  reports that  has never smoked. she has never used smokeless tobacco. She reports that she does not drink alcohol or use drugs.  Family History:  Family History  Problem Relation Age of Onset  . Leukemia Father   . Seizures Daughter   . Drug abuse Mother   . Alcohol abuse Mother      Prior to Admission medications   Medication Sig Start Date End Date Taking? Authorizing Provider  alprazolam Duanne Moron) 2 MG tablet Take 1 tablet (2 mg total) by mouth at bedtime as needed for sleep. 10/21/17  Yes Plovsky, Berneta Sages, MD  desipramine (NORPRAMIN) 25 MG tablet 2 tablets (50 mg) by mouth daily Patient taking differently: Take 50 mg by mouth daily.  10/21/17  Yes Plovsky, Berneta Sages, MD  triamterene-hydrochlorothiazide (MAXZIDE-25) 37.5-25 MG tablet Take 1 tablet by mouth daily. 11/09/17  Yes [provider]  warfarin (COUMADIN) 5 MG tablet Take 1 tablet (5 mg total) by mouth daily at 6 PM. Adjust dose based on  INR with primary care physician in 5 days Patient taking differently: Take 2.5-5 mg by mouth daily. Take 5mg  by mouth on Monday, Wednesday, and Friday. Take 2.5mg  by mouth on Tuesday, Thursday, Saturday, and Sunday. 08/11/16  Yes Brunetta Genera, MD  zolpidem (AMBIEN CR) 12.5 MG CR tablet TAKE 12.5 MG BY MOUTH EVERY NIGHT 10/21/17  Yes Plovsky, Berneta Sages, MD  zolpidem (AMBIEN CR) 12.5 MG CR tablet Take 1 tablet (12.5 mg total)  by mouth at bedtime as needed for sleep. Patient not taking: Reported on 11/16/2017 06/24/17   Norma Fredrickson, MD    Physical Exam: Vitals:   11/16/17 0045 11/16/17 0130 11/16/17 0157 11/16/17 0200  BP: 119/74 120/82  111/77  Pulse:    (!) 108  Resp: (!) 25 (!) 26  20  Temp:   98.8 F (37.1 C)   TempSrc:   Oral   SpO2:    100%  Weight:      Height:       General: Not in acute distress HEENT:       Eyes: PERRL, EOMI, no scleral icterus.       ENT: No discharge from the ears and nose, no pharynx injection, no tonsillar enlargement.        Neck: No JVD, no bruit, no mass felt. Heme: No neck lymph node enlargement. Cardiac: S1/S2, RRR, No murmurs, No gallops or rubs. Respiratory: No rales, wheezing, rhonchi or rubs. GI: Soft, nondistended, nontender, no rebound pain, no organomegaly, BS present. GU: has hematuria and bilateral CVA tenderness. Ext: No pitting leg edema bilaterally. 2+DP/PT pulse bilaterally. Musculoskeletal: No joint deformities, No joint redness or warmth, no limitation of ROM in spin. Skin: No rashes.  Neuro: Alert, oriented X3, cranial nerves II-XII grossly intact, moves all extremities normally.  Psych: Patient is not psychotic, no suicidal or hemocidal ideation.  Labs on Admission: I have personally reviewed following labs and imaging studies  CBC: Recent Labs  Lab 11/15/17 2309  WBC 27.4*  NEUTROABS 23.8*  HGB 12.1  HCT 36.8  MCV 82.7  PLT 109   Basic Metabolic Panel: Recent Labs  Lab 11/15/17 2309  NA 135  K 3.4*  CL 97*  CO2 18*  GLUCOSE 89  BUN 14  CREATININE 1.75*  CALCIUM 8.8*   GFR: Estimated Creatinine Clearance: 36.7 mL/min (A) (by C-G formula based on SCr of 1.75 mg/dL (H)). Liver Function Tests: Recent Labs  Lab 11/15/17 2309  AST 34  ALT 20  ALKPHOS 85  BILITOT 2.3*  PROT 7.3  ALBUMIN 3.3*   No results for input(s): LIPASE, AMYLASE in the last 168 hours. No results for input(s): AMMONIA in the last 168  hours. Coagulation Profile: Recent Labs  Lab 11/15/17 2309  INR 2.60   Cardiac Enzymes: No results for input(s): CKTOTAL, CKMB, CKMBINDEX, TROPONINI in the last 168 hours. BNP (last 3 results) No results for input(s): PROBNP in the last 8760 hours. HbA1C: No results for input(s): HGBA1C in the last 72 hours. CBG: No results for input(s): GLUCAP in the last 168 hours. Lipid Profile: No results for input(s): CHOL, HDL, LDLCALC, TRIG, CHOLHDL, LDLDIRECT in the last 72 hours. Thyroid Function Tests: No results for input(s): TSH, T4TOTAL, FREET4, T3FREE, THYROIDAB in the last 72 hours. Anemia Panel: No results for input(s): VITAMINB12, FOLATE, FERRITIN, TIBC, IRON, RETICCTPCT in the last 72 hours. Urine analysis:    Component Value Date/Time   COLORURINE YELLOW 11/16/2017 0050   APPEARANCEUR HAZY (A) 11/16/2017 0050   LABSPEC 1.004 (  L) 11/16/2017 0050   PHURINE 7.0 11/16/2017 0050   GLUCOSEU NEGATIVE 11/16/2017 0050   HGBUR LARGE (A) 11/16/2017 0050   BILIRUBINUR NEGATIVE 11/16/2017 0050   KETONESUR 5 (A) 11/16/2017 0050   PROTEINUR NEGATIVE 11/16/2017 0050   NITRITE NEGATIVE 11/16/2017 0050   LEUKOCYTESUR LARGE (A) 11/16/2017 0050   Sepsis Labs: @LABRCNTIP (procalcitonin:4,lacticidven:4) )No results found for this or any previous visit (from the past 240 hour(s)).   Radiological Exams on Admission: Dg Chest Port 1 View  Result Date: 11/15/2017 CLINICAL DATA:  Fever EXAM: PORTABLE CHEST 1 VIEW COMPARISON:  12/07/2015 chest radiograph. FINDINGS: Stable cardiomediastinal silhouette with normal heart size. No pneumothorax. No pleural effusion. Lungs appear clear, with no acute consolidative airspace disease and no pulmonary edema. IMPRESSION: No active disease. Electronically Signed   By: Ilona Sorrel M.D.   On: 11/15/2017 23:29     EKG: Not done in ED, will get one.   Assessment/Plan Principal Problem:   Pyelonephritis Active Problems:   Stroke ([place])   Pulmonary embolism  ([facility])   Lupus   Depression with anxiety   AKI (acute kidney injury) ([place])   Sepsis ([place])   Hypokalemia   HTN (hypertension)   Sepsis due to pyelonephritis: Patient has positive urinalysis and bilateral flank tenderness, consistent with pyelonephritis. Patient reports hematuria, will need to rule out kidney stone or obstruction. Patient's pregnancy test is positive, will avoid CT scan. Pt meets criteria for sepsis with leukocytosis, tachycardia, tachypnea, elevated lactic acid of 10.51, which is trending down to 3.1 after 2.25 L normal saline bolus. Currently hemodynamically stable.   - Admit to telemetry bed as inpt -  Ceftriaxone by IV (pt received one dose of vancomycin and Zosyn) - Follow up results of urine and blood cx and amend antibiotic regimen if needed per sensitivity results - prn Zofran for nausea; prn oxycodone and morphine for pain - will get Procalcitonin and trend lactic acid levels per sepsis protocol. - IVF: 3.25L of NS bolus in ED, followed by 150 cc/h   Fever: due to pyelonephritis. It is difficult to choose medication to lower the temperature. Patient is allergic to Tylenol. Patient has acute renal injury, cannot use ibuprofen -Treat pyelonephritis as above, hoping temperature will be lowered with control of infection.  AKI: Creatinine 1.74, BUN 14. Likely due to pyelonephritis. -Follow up renal function by BMP -IV fluid as above -Hold Maxzide  HTN: -hold Maxzide due to AKI -IV hydralazine when necessary  Hypokalemia: K= 3.4 on admission - Repleted  Addendum: K=2.5 in am -will give KCl total of 80 mEQ -will give magnesium sulfate 1 g -Check  magnesium level  Hx of PE: she is on Coumadin with INR 2.60. She has hematuria, which is most likely due to urinary tract infection. Hemoglobin 12.1, which was 12.9 on 11/24/16. -Will continue Coumadin now -Moderate hemoglobin, if hemoglobin drops significantly, we'll consider to hold Coumadin  Stroke [facility]):  - pt is  on coumadin which is for PE primarily.  Depression and anxiety: Stable, no suicidal or homicidal ideations. -Continue home medications: xanxa and desipramine  Hx of Lupus: pt states that she is supposed to Plaquenil, but she stopped taking it 6 months ago. She states that her rheumatologist asked her for checkup before refill prescription could be given, but she did not go to his office yet. -I highly recommended her to follow-up with her rheumatologist as soon as possible   DVT ppx: on coumadin Code Status: Full code Family Communication: None at bed side.  Disposition Plan:  Anticipate discharge back to previous home environment Consults called:  none Admission status:  Inpatient/tele   Date of Service 11/16/2017    Ivor Costa [facility] Pager [phone]  If 7PM-7AM, please contact night-coverage [email or web address] Password [email or web address] 11/16/2017, 2:03 AM

## 2017-11-16 NOTE — ED Notes (Signed)
Date and time results received: 11/16/17  (use smartphrase ".now" to insert current time)  Test: K+ Critical Value: 2.5

## 2017-11-17 DIAGNOSIS — R319 Hematuria, unspecified: Secondary | ICD-10-CM

## 2017-11-17 DIAGNOSIS — N390 Urinary tract infection, site not specified: Secondary | ICD-10-CM

## 2017-11-17 DIAGNOSIS — I2699 Other pulmonary embolism without acute cor pulmonale: Secondary | ICD-10-CM

## 2017-11-17 DIAGNOSIS — A419 Sepsis, unspecified organism: Principal | ICD-10-CM

## 2017-11-17 DIAGNOSIS — I10 Essential (primary) hypertension: Secondary | ICD-10-CM

## 2017-11-17 DIAGNOSIS — I639 Cerebral infarction, unspecified: Secondary | ICD-10-CM

## 2017-11-17 LAB — PROTIME-INR
INR: 2.44
PROTHROMBIN TIME: 26.3 s — AB (ref 11.4–15.2)

## 2017-11-17 LAB — BASIC METABOLIC PANEL
ANION GAP: 10 (ref 5–15)
BUN: 12 mg/dL (ref 6–20)
CALCIUM: 7.7 mg/dL — AB (ref 8.9–10.3)
CO2: 20 mmol/L — ABNORMAL LOW (ref 22–32)
Chloride: 106 mmol/L (ref 101–111)
Creatinine, Ser: 1.23 mg/dL — ABNORMAL HIGH (ref 0.44–1.00)
GFR, EST AFRICAN AMERICAN: 58 mL/min — AB (ref >60)
GFR, EST NON AFRICAN AMERICAN: 50 mL/min — AB (ref >60)
GLUCOSE: 87 mg/dL (ref 65–99)
POTASSIUM: 3.2 mmol/L — AB (ref 3.5–5.1)
SODIUM: 136 mmol/L (ref 135–145)

## 2017-11-17 LAB — CBC
HCT: 29.4 % — ABNORMAL LOW (ref 36.0–46.0)
Hemoglobin: 9.6 g/dL — ABNORMAL LOW (ref 12.0–15.0)
MCH: 26.4 pg (ref 26.0–34.0)
MCHC: 32.7 g/dL (ref 30.0–36.0)
MCV: 80.8 fL (ref 78.0–100.0)
PLATELETS: 261 10*3/uL (ref 150–400)
RBC: 3.64 MIL/uL — AB (ref 3.87–5.11)
RDW: 16.4 % — AB (ref 11.5–15.5)
WBC: 14.2 10*3/uL — ABNORMAL HIGH (ref 4.0–10.5)

## 2017-11-17 LAB — HCG, QUANTITATIVE, PREGNANCY: HCG, BETA CHAIN, QUANT, S: 7 m[IU]/mL — AB (ref <5)

## 2017-11-17 LAB — UREA NITROGEN, URINE: UREA NITROGEN UR: 135 mg/dL

## 2017-11-17 LAB — MAGNESIUM: Magnesium: 1.6 mg/dL — ABNORMAL LOW (ref 1.7–2.4)

## 2017-11-17 LAB — GLUCOSE, CAPILLARY: GLUCOSE-CAPILLARY: 78 mg/dL (ref 65–99)

## 2017-11-17 MED ORDER — WARFARIN SODIUM 7.5 MG PO TABS
7.5000 mg | ORAL_TABLET | Freq: Once | ORAL | Status: AC
  Administered 2017-11-17: 7.5 mg via ORAL
  Filled 2017-11-17: qty 1

## 2017-11-17 MED ORDER — POTASSIUM CHLORIDE CRYS ER 20 MEQ PO TBCR
40.0000 meq | EXTENDED_RELEASE_TABLET | Freq: Once | ORAL | Status: AC
  Administered 2017-11-17: 40 meq via ORAL
  Filled 2017-11-17: qty 2

## 2017-11-17 MED ORDER — WARFARIN - PHARMACIST DOSING INPATIENT
Freq: Every day | Status: DC
  Administered 2017-11-17: 18:00:00

## 2017-11-17 MED ORDER — DESIPRAMINE HCL 50 MG PO TABS
50.0000 mg | ORAL_TABLET | Freq: Every day | ORAL | Status: DC
  Administered 2017-11-17 – 2017-11-20 (×4): 50 mg via ORAL
  Filled 2017-11-17 (×4): qty 1

## 2017-11-17 MED ORDER — IBUPROFEN 400 MG PO TABS
400.0000 mg | ORAL_TABLET | Freq: Three times a day (TID) | ORAL | Status: AC | PRN
Start: 2017-11-17 — End: 2017-11-17
  Administered 2017-11-17: 400 mg via ORAL
  Filled 2017-11-17: qty 1

## 2017-11-17 MED ORDER — MAGNESIUM SULFATE 2 GM/50ML IV SOLN
2.0000 g | Freq: Once | INTRAVENOUS | Status: AC
  Administered 2017-11-17: 2 g via INTRAVENOUS
  Filled 2017-11-17: qty 50

## 2017-11-17 MED ORDER — ALPRAZOLAM 0.5 MG PO TABS: 2.0000 mg | ORAL_TABLET | Freq: Every evening | ORAL | Status: DC | PRN

## 2017-11-17 NOTE — Progress Notes (Signed)
Initial Nutrition Assessment  DOCUMENTATION CODES:   Not applicable  INTERVENTION:  1. Ensure Enlive po BID, each supplement provides 350 kcal and 20 grams of protein  NUTRITION DIAGNOSIS:    Inadequate oral intake related to nausea, poor appetite as evidenced by per patient/family report  GOAL:   Patient will meet greater than or equal to 90% of their needs  MONITOR:   PO intake, Skin, I & O's, Labs, Supplement acceptance  REASON FOR ASSESSMENT:   Malnutrition Screening Tool    ASSESSMENT:   52 year old female with history of hypertension, stroke, anxiety and depression, PTSD, PE on Coumadin, lupus presented with bilateral flank pain with hematuria, fevers and chills with nausea for almost 4 days.  Spoke with Lauren Chung at bedside. She reports normally eats nothing for breakfast, nothing for lunch, will eat a large cooked meal for dinner. She states she has been doing this since she was a child. Despite this being her normal meal pattern, she reports losing 12 pounds in the last month, a 7% severe weight loss for timeframe. She is unsure of a reason behind said weight loss. Had eggs and bacon for breakfast this morning. Appetite seems to be improving, denies nausea today.   Labs reviewed:  K+ 3.2, Mg 1.6 Medications reviewed and include:  NS at 127mL/hr   NUTRITION - FOCUSED PHYSICAL EXAM:    Most Recent Value  Orbital Region  No depletion  Upper Arm Region  No depletion  Thoracic and Lumbar Region  No depletion  Buccal Region  No depletion  Temple Region  No depletion  Clavicle Bone Region  No depletion  Clavicle and Acromion Bone Region  No depletion  Scapular Bone Region  No depletion  Dorsal Hand  No depletion  Patellar Region  No depletion  Anterior Thigh Region  No depletion  Posterior Calf Region  No depletion  Edema (RD Assessment)  None  Hair  Reviewed  Eyes  Reviewed  Mouth  Reviewed  Skin  Reviewed  Nails  Reviewed       Diet Order:  Diet  Heart Room service appropriate? Yes; Fluid consistency: Thin  EDUCATION NEEDS:   Education needs have been addressed  Skin:  Skin Assessment: Reviewed RN Assessment  Last BM:  11/12/2017  Height:   Ht Readings from Last 1 Encounters:  11/16/17 5\' 8"  (1.727 m)    Weight:   Wt Readings from Last 1 Encounters:  11/15/17 160 lb (72.6 kg)    Ideal Body Weight:  63.63 kg  BMI:  Body mass index is 24.33 kg/m.  Estimated Nutritional Needs:   Kcal:  3354-5625 calories (MSJ x1.2-1.3)  Protein:  95-109 grams  Fluid:  1.7-2L   Satira Anis. Kamrin Spath, MS, RD LDN Inpatient Clinical Dietitian Pager [phone]

## 2017-11-17 NOTE — Progress Notes (Signed)
PROGRESS NOTE    Lauren Chung  NID:[id number] DOB: 1965/11/23 DOA: 11/15/2017 PCP: Damaris Hippo, MD   Chief Complaint  Patient presents with  . Weakness    Brief Narrative:  52 year old female history of hypertension, CVA, TIA, depression with anxiety, PTSD, pulmonary embolism on Coumadin, lupus, presented with complaints of flank pain hematuria fevers and chills. Patient found to have positive UA. Currently being treated for sepsis secondary to acute pyelonephritis. Patient was found to have a positive hCG of 7, however urine pregnancy test was negative.  Assessment & Plan   Sepsis secondary to acute pyelonephritis -Patient febrile with tachycardia, leukocytosis -UA showed TNTC WBC, RBC, many bacteria, large leukocytes -Urine culture pending -Blood cultures pending -Continue ceftriaxone -Renal ultrasound negative for stones or hydronephrosis  Hypokalemia -Replacing continue to monitor BMP  Hypomagnesemia -Magnesium 1.6, will replace and continue to montior  Acute kidney injury  -Creatinine upon admission 1.75, currently trending downward -Suspect secondary to dehydration and sepsis -Creatinine currently 1.23, continue to monitor BMP  Chronic Anemia -Has been microcytic to normocytic in the past -Hemoglobin currently 9.6, baseline appears to be 11-9 -Continue to monitor CBC  Positive HCG -Patient had a positive quantitative hCG of 7 however urine pregnancy test was negative -Patient is currently sexually active, however unlikely to have pregnancy. Patient states her boyfriend a vasectomy and reports having regular menstrual cycles. -Continue to monitor hCG  History of pulmonary embolism -Currently takes Coumadin however was switched to Lovenox given her slightly positive hCG. -Will restart patient's Coumadin today and continue to monitor INR  Depression/anxiety -Will restart patient's benzodiazepines  History of stroke -Continue Coumadin. Not sure why patient is  not on a statin  DVT Prophylaxis  Lovenox --> Coumadin  Code Status: Full  Family Communication: None at bedside  Disposition Plan: Admitted. Suspect possible discharge to home in 1-2 days pending improvement of patient's fever.  Consultants None  Procedures  Renal US  Antibiotics   Anti-infectives (From admission, onward)   Start     Dose/Rate Route Frequency Ordered Stop   11/17/17 0800  vancomycin (VANCOCIN) IVPB 1000 mg/200 mL premix  Status:  Discontinued     1,000 mg 200 mL/hr over 60 Minutes Intravenous Every 24 hours 11/16/17 0005 11/16/17 0121   11/17/17 0600  piperacillin-tazobactam (ZOSYN) IVPB 3.375 g  Status:  Discontinued     3.375 g 12.5 mL/hr over 240 Minutes Intravenous Every 8 hours 11/16/17 0005 11/16/17 0121   11/16/17 0800  cefTRIAXone (ROCEPHIN) 1 g in dextrose 5 % 50 mL IVPB     1 g 100 mL/hr over 30 Minutes Intravenous Every 24 hours 11/16/17 0122     11/15/17 2315  piperacillin-tazobactam (ZOSYN) IVPB 3.375 g     3.375 g 100 mL/hr over 30 Minutes Intravenous  Once 11/15/17 2314 11/16/17 0021   11/15/17 2315  vancomycin (VANCOCIN) IVPB 1000 mg/200 mL premix     1,000 mg 200 mL/hr over 60 Minutes Intravenous  Once 11/15/17 2314 11/16/17 0113      Subjective:   Lauren Chung seen and examined today.  Patient states she is feeling mildly better. Although continues to have fevers. Denies any current abdominal pain, nausea vomiting, diarrhea or constipation. Denies current back pain. Denies current chest pain or shortness of breath.  Objective:   Vitals:   11/16/17 1551 11/16/17 2048 11/17/17 0522 11/17/17 0640  BP:  106/67 (!) 104/48   Pulse: (!) 105 (!) 117 (!) 118   Resp: 20 17 17    Temp:  99 F (37.2 C) 98.5 F (36.9 C) (!) 102 F (38.9 C) 99.9 F (37.7 C)  TempSrc: Oral Oral Oral Oral  SpO2: 100% 98% 97%   Weight:      Height:        Intake/Output Summary (Last 24 hours) at 11/17/2017 1125 Last data filed at 11/17/2017 0522 Gross per  24 hour  Intake 2455 ml  Output -  Net 2455 ml   Filed Weights   11/15/17 2259  Weight: 72.6 kg (160 lb)    Exam  General: Well developed, well nourished, NAD, appears stated age  HEENT: NCAT, mucous membranes moist.   Cardiovascular: S1 S2 auscultated, no rubs, murmurs or gallops. Regular rate and rhythm.  Respiratory: Clear to auscultation bilaterally with equal chest rise  Abdomen: Soft, nontender, nondistended, + bowel sounds  Extremities: warm dry without cyanosis clubbing or edema  Neuro: AAOx3, nonfocal  Psych: Normal affect and demeanor with intact judgement and insight   Data Reviewed: I have personally reviewed following labs and imaging studies  CBC: Recent Labs  Lab 11/15/17 2309 11/16/17 0428 11/17/17 0526  WBC 27.4* 18.5* 14.2*  NEUTROABS 23.8*  --   --   HGB 12.1 9.9* 9.6*  HCT 36.8 30.9* 29.4*  MCV 82.7 81.1 80.8  PLT 324 269 347   Basic Metabolic Panel: Recent Labs  Lab 11/15/17 2309 11/16/17 0428 11/16/17 1328 11/17/17 0526  NA 135 135 134* 136  K 3.4* 2.5* 3.6 3.2*  CL 97* 105 105 106  CO2 18* 20* 19* 20*  GLUCOSE 89 93 96 87  BUN 14 11 9 12   CREATININE 1.75* 1.44* 1.25* 1.23*  CALCIUM 8.8* 7.3* 7.7* 7.7*  MG  --   --   --  1.6*   GFR: Estimated Creatinine Clearance: 55.2 mL/min (A) (by C-G formula based on SCr of 1.23 mg/dL (H)). Liver Function Tests: Recent Labs  Lab 11/15/17 2309  AST 34  ALT 20  ALKPHOS 85  BILITOT 2.3*  PROT 7.3  ALBUMIN 3.3*   No results for input(s): LIPASE, AMYLASE in the last 168 hours. No results for input(s): AMMONIA in the last 168 hours. Coagulation Profile: Recent Labs  Lab 11/15/17 2309 11/16/17 0428 11/16/17 1638 11/17/17 0526  INR 2.60 3.05 3.57 2.44   Cardiac Enzymes: No results for input(s): CKTOTAL, CKMB, CKMBINDEX, TROPONINI in the last 168 hours. BNP (last 3 results) No results for input(s): PROBNP in the last 8760 hours. HbA1C: No results for input(s): HGBA1C in the  last 72 hours. CBG: Recent Labs  Lab 11/17/17 0746  GLUCAP 78   Lipid Profile: No results for input(s): CHOL, HDL, LDLCALC, TRIG, CHOLHDL, LDLDIRECT in the last 72 hours. Thyroid Function Tests: No results for input(s): TSH, T4TOTAL, FREET4, T3FREE, THYROIDAB in the last 72 hours. Anemia Panel: No results for input(s): VITAMINB12, FOLATE, FERRITIN, TIBC, IRON, RETICCTPCT in the last 72 hours. Urine analysis:    Component Value Date/Time   COLORURINE YELLOW 11/16/2017 0050   APPEARANCEUR HAZY (A) 11/16/2017 0050   LABSPEC 1.004 (L) 11/16/2017 0050   PHURINE 7.0 11/16/2017 0050   GLUCOSEU NEGATIVE 11/16/2017 0050   HGBUR LARGE (A) 11/16/2017 0050   BILIRUBINUR NEGATIVE 11/16/2017 0050   KETONESUR 5 (A) 11/16/2017 0050   PROTEINUR NEGATIVE 11/16/2017 0050   NITRITE NEGATIVE 11/16/2017 0050   LEUKOCYTESUR LARGE (A) 11/16/2017 0050   Sepsis Labs: @LABRCNTIP (procalcitonin:4,lacticidven:4)  ) Recent Results (from the past 240 hour(s))  Culture, blood (Routine x 2)     Status:  None (Preliminary result)   Collection Time: 11/15/17 11:15 PM  Result Value Ref Range Status   Specimen Description BLOOD RIGHT ANTECUBITAL  Final   Special Requests   Final    BOTTLES DRAWN AEROBIC AND ANAEROBIC Blood Culture adequate volume   Culture NO GROWTH < 24 HOURS  Final   Report Status PENDING  Incomplete  Urine Culture     Status: Abnormal (Preliminary result)   Collection Time: 11/16/17 12:50 AM  Result Value Ref Range Status   Specimen Description URINE, RANDOM  Final   Special Requests NONE  Final   Culture (A)  Final    30,000 COLONIES/mL ESCHERICHIA COLI SUSCEPTIBILITIES TO FOLLOW    Report Status PENDING  Incomplete      Radiology Studies: US Renal  Result Date: 11/16/2017 CLINICAL DATA:  Acute kidney injury.  Pyelonephritis. EXAM: RENAL / URINARY TRACT ULTRASOUND COMPLETE COMPARISON:  None. FINDINGS: Right Kidney: Length: 10.6 cm. Mildly echogenic right renal parenchyma.  Normal right renal parenchymal thickness and right renal size. No right hydronephrosis. No right renal mass. Left Kidney: Length: 10.6 cm. Mildly echogenic left renal parenchyma. Normal left renal parenchymal thickness and left renal size. No left hydronephrosis. No left renal mass. Bladder: Appears normal for degree of bladder distention. IMPRESSION: 1. No hydronephrosis. 2. Mildly echogenic normal size kidneys, indicative of nonspecific renal parenchymal disease of uncertain chronicity. 3. Normal bladder. Electronically Signed   By: Ilona Sorrel M.D.   On: 11/16/2017 02:42   Dg Chest Port 1 View  Result Date: 11/15/2017 CLINICAL DATA:  Fever EXAM: PORTABLE CHEST 1 VIEW COMPARISON:  12/07/2015 chest radiograph. FINDINGS: Stable cardiomediastinal silhouette with normal heart size. No pneumothorax. No pleural effusion. Lungs appear clear, with no acute consolidative airspace disease and no pulmonary edema. IMPRESSION: No active disease. Electronically Signed   By: Ilona Sorrel M.D.   On: 11/15/2017 23:29     Scheduled Meds: . feeding supplement (ENSURE ENLIVE)  237 mL Oral BID BM  . Influenza vac split quadrivalent PF  0.5 mL Intramuscular Tomorrow-1000   Continuous Infusions: . sodium chloride 150 mL/hr at 11/17/17 1005  . cefTRIAXone (ROCEPHIN)  IV Stopped (11/17/17 0841)     LOS: 1 day   Time Spent in minutes   45 minutes  Jahrel Borthwick D.O. on 11/17/2017 at 11:25 AM  Between 7am to 7pm - Pager - [phone]  After 7pm go to [email or web address] - password [email or web address]  And look for the night coverage person covering for me after hours  [facility] Office  [phone]

## 2017-11-17 NOTE — Progress Notes (Signed)
ANTICOAGULATION CONSULT NOTE - [place] for Coumadin Indication: Hx of PE  Allergies  Allergen Reactions  . Codeine Shortness Of Breath  . Contrast Media [Iodinated Diagnostic Agents] Shortness Of Breath  . Tylenol [Acetaminophen] Shortness Of Breath    Patient Measurements: Height: 5\' 8"  (172.7 cm) Weight: 160 lb (72.6 kg) IBW/kg (Calculated) : 63.9  Vital Signs: Temp: 99.9 F (37.7 C) (01/15 0640) Temp Source: Oral (01/15 0640) BP: 104/48 (01/15 0522) Pulse Rate: 118 (01/15 0522)  Labs: Recent Labs    11/15/17 2309 11/16/17 0428 11/16/17 1328 11/16/17 1638 11/17/17 0526  HGB 12.1 9.9*  --   --  9.6*  HCT 36.8 30.9*  --   --  29.4*  PLT 324 269  --   --  261  LABPROT 27.6* 31.3*  --  35.4* 26.3*  INR 2.60 3.05  --  3.57 2.44  CREATININE 1.75* 1.44* 1.25*  --  1.23*    Estimated Creatinine Clearance: 55.2 mL/min (A) (by C-G formula based on SCr of 1.23 mg/dL (H)).   Medical History: Past Medical History:  Diagnosis Date  . Anxiety   . Depression   . Lupus   . PE (pulmonary embolism)   . PTSD (post-traumatic stress disorder)   . Stroke ([place])   . TIA (transient ischemic attack)     Medications:  Coumadin PTA 2.5mg  daily except 5mg  MWF  Assessment: 52 year old female admitted with fever and flank pain who was on Coumadin PTA for hx PE and stroke. Patient was found to have positive hCG pregnancy test in ED and warfarin was discontinued. Pharmacy was consulted to start Lovenox.  Urine preg test actually negative.  Spoke with Dr. Ree Kida, will resume Coumadin.  Pt only missed one dose of Coumadin due to above events.  INR remains therapeutic, but trending down.  Goal of Therapy:  INR 2-3 Monitor platelets by anticoagulation protocol: Yes   Plan:  Coumadin 7.5mg  PO x 1 tonight (boosted dose to cover dose held on 1/14) Daily INR  [employer], Pharm.D., BCPS Clinical Pharmacist Pager: [phone] Clinical phone for  11/17/2017 from 8:30-4:00 is [phone]. After 4pm, please call [facility] [phone]) for assistance. 11/17/2017 1:25 PM

## 2017-11-18 LAB — URINE CULTURE

## 2017-11-18 LAB — BASIC METABOLIC PANEL
Anion gap: 10 (ref 5–15)
BUN: 9 mg/dL (ref 6–20)
CALCIUM: 7.9 mg/dL — AB (ref 8.9–10.3)
CO2: 21 mmol/L — AB (ref 22–32)
CREATININE: 0.93 mg/dL (ref 0.44–1.00)
Chloride: 107 mmol/L (ref 101–111)
GFR calc Af Amer: >60 mL/min (ref >60)
GLUCOSE: 105 mg/dL — AB (ref 65–99)
Potassium: 3.1 mmol/L — ABNORMAL LOW (ref 3.5–5.1)
Sodium: 138 mmol/L (ref 135–145)

## 2017-11-18 LAB — CBC
HEMATOCRIT: 30.2 % — AB (ref 36.0–46.0)
Hemoglobin: 9.8 g/dL — ABNORMAL LOW (ref 12.0–15.0)
MCH: 26.3 pg (ref 26.0–34.0)
MCHC: 32.5 g/dL (ref 30.0–36.0)
MCV: 81.2 fL (ref 78.0–100.0)
PLATELETS: 328 10*3/uL (ref 150–400)
RBC: 3.72 MIL/uL — ABNORMAL LOW (ref 3.87–5.11)
RDW: 17 % — ABNORMAL HIGH (ref 11.5–15.5)
WBC: 8.8 10*3/uL (ref 4.0–10.5)

## 2017-11-18 LAB — PROTIME-INR
INR: 2.3
Prothrombin Time: 25.1 seconds — ABNORMAL HIGH (ref 11.4–15.2)

## 2017-11-18 LAB — GLUCOSE, CAPILLARY: Glucose-Capillary: 81 mg/dL (ref 65–99)

## 2017-11-18 LAB — MAGNESIUM: Magnesium: 1.9 mg/dL (ref 1.7–2.4)

## 2017-11-18 MED ORDER — WARFARIN SODIUM 5 MG PO TABS
5.0000 mg | ORAL_TABLET | Freq: Once | ORAL | Status: AC
  Administered 2017-11-18: 5 mg via ORAL
  Filled 2017-11-18: qty 1

## 2017-11-18 MED ORDER — IBUPROFEN 400 MG PO TABS
400.0000 mg | ORAL_TABLET | Freq: Three times a day (TID) | ORAL | Status: AC | PRN
Start: 2017-11-18 — End: 2017-11-18
  Administered 2017-11-18: 400 mg via ORAL
  Filled 2017-11-18: qty 1

## 2017-11-18 MED ORDER — POTASSIUM CHLORIDE 10 MEQ/100ML IV SOLN
10.0000 meq | INTRAVENOUS | Status: DC
  Administered 2017-11-18 (×2): 10 meq via INTRAVENOUS
  Filled 2017-11-18 (×2): qty 100

## 2017-11-18 MED ORDER — ALUM & MAG HYDROXIDE-SIMETH 200-200-20 MG/5ML PO SUSP
30.0000 mL | Freq: Four times a day (QID) | ORAL | Status: DC | PRN
  Administered 2017-11-18: 30 mL via ORAL
  Filled 2017-11-18: qty 30

## 2017-11-18 MED ORDER — POTASSIUM CHLORIDE 10 MEQ/100ML IV SOLN
10.0000 meq | INTRAVENOUS | Status: AC
  Administered 2017-11-18 (×2): 10 meq via INTRAVENOUS
  Filled 2017-11-18 (×2): qty 100

## 2017-11-18 MED ORDER — POTASSIUM CHLORIDE 20 MEQ/15ML (10%) PO SOLN
40.0000 meq | Freq: Every day | ORAL | Status: DC
  Administered 2017-11-18 – 2017-11-19 (×2): 40 meq via ORAL
  Filled 2017-11-18 (×3): qty 30

## 2017-11-18 MED ORDER — SODIUM CHLORIDE 0.9 % IV BOLUS (SEPSIS)
500.0000 mL | Freq: Once | INTRAVENOUS | Status: AC
  Administered 2017-11-18: 500 mL via INTRAVENOUS

## 2017-11-18 MED ORDER — POTASSIUM CHLORIDE 10 MEQ/100ML IV SOLN: 10.0000 meq | INTRAVENOUS | Status: DC

## 2017-11-18 NOTE — Progress Notes (Addendum)
Patient called RN into room and stated that she was having 6/10 mid to low sternal tightness and soreness. Patient stated the pain had been going on for the past few days and she stated the pain was a 2/10 during the past few days, but had just suddenly worsened.  RN gave patient PRN maalox for pain at [place]. Upon entering room at [place], patient was asleep. Pt awakened when RN was changing her IV fluid bag. At that time she rated her pain 2/10 and stated that she felt much better. Patient then rolled over on the bed to go back to sleep. Will continue to monitor and treat per MD orders.   At 2:45am-- After voiding pt went back to bed. At this time, pt stated that the lower sternum tightness was back and that the maalox only worked temporarily.  Pt continued to explain that the tightness was now going down into her stomach.  RN offered to give pt PRN pain medication and pt constantly refused and stated, "I will just talk to the doctor about it tomorrow morning. I don't like taking any medicine." RN again offered to get the patient a medication/drink/food for comfort and patient continued to refuse. Pt then stated to RN that she just wanted to sleep. Then the patient laid back down in the bed and covered herself with all the covers to go to sleep. Will continue to monitor and treat per MD orders.

## 2017-11-18 NOTE — Progress Notes (Addendum)
Pt temp was 100.7 rectally with no orders for PRN ibuprofen. Patient also tachy and HR running between 110-120. Kirby,NP notified and placed order for 500 mL bolus. Kirby,NP informed this RN that since ibuprofen is filtered by the kidneys, she did not want to give her a medication that will affect her kidneys since the patient is here with pyelonephritis.  Rechecked temp while bolus running and rectal temp is now 102.2. Kirby,NP notified and placed an order for a second 500 mL bolus. Will continue to monitor and treat per MD orders.  At 6:40 am:  After completion of 2nd 500 mL bolus, this RN rechecked pt's rectal temp and it was 102.4. Kirby,NP notified and she placed an order for another 500 mL fluid bolus.  Third 500 mL bolus is running at this time. Will continue to monitor and treat per MD orders.  At 7:15 am: Third 500 mL bolus completed. Rectal temp at this time is 102.1. Oncoming dayshift RN aware of patient's fever and was in patient's room with this RN when rechecking rectal temp.  Mikhail,MD placed order for PRN ibuprofen for patient's fever. Will administer at this time. Oncoming RN Erin aware of situation.

## 2017-11-18 NOTE — Progress Notes (Signed)
PROGRESS NOTE    Lauren Chung  SFK:[id number] DOB: 10/02/1966 DOA: 11/15/2017 PCP: Damaris Hippo, MD   Chief Complaint  Patient presents with  . Weakness    Brief Narrative:  52 year old female history of hypertension, CVA, TIA, depression with anxiety, PTSD, pulmonary embolism on Coumadin, lupus, presented with complaints of flank pain hematuria fevers and chills. Patient found to have positive UA. Currently being treated for sepsis secondary to acute pyelonephritis. Patient was found to have a positive hCG of 7, however urine pregnancy test was negative.  Continues to spike fevers overnight.  Assessment & Plan   Sepsis secondary to acute pyelonephritis -Patient febrile with tachycardia, leukocytosis -UA showed TNTC WBC, RBC, many bacteria, large leukocytes -Urine culture 30K Ecoli, pending susceptibilities  -Blood cultures show no growth to date -Continue ceftriaxone -Renal ultrasound negative for stones or hydronephrosis -continues to spike fevers overnight, TMax 102.42F -Patient was given IVF boluses -allergy to tylenol, will give motrin (but monitor creatinine closely) -leukocytosis resolved  Hypokalemia -Will replace both IV and oral form -continue to monitor BMP  Hypomagnesemia -Magnesium 1.9 after replacement -continue to monitor   Acute kidney injury  -Creatinine upon admission 1.75, currently trending downward -Suspect secondary to dehydration and sepsis -Creatinine currently 0.93 -continue to monitor BMP  Chronic Anemia -Has been microcytic to normocytic in the past -Hemoglobin currently 9.8, baseline appears to be 11 -Continue to monitor CBC  Positive HCG -Patient had a positive quantitative hCG of 7 however urine pregnancy test was negative -Patient is currently sexually active, however unlikely to have pregnancy. Patient states her boyfriend a vasectomy and reports having regular menstrual cycles. -Continue to monitor hCG  History of pulmonary  embolism -Currently takes Coumadin however was switched to Lovenox given her slightly positive hCG. -lovenox discontinued and coumadin restarted on 11/17/17. INR 2.3 today  Depression/anxiety -Restarted home medications  History of stroke -Continue Coumadin. Not sure why patient is not on a statin  DVT Prophylaxis  Coumadin  Code Status: Full  Family Communication: None at bedside  Disposition Plan: Admitted. Suspect possible discharge to home in 1-2 days pending improvement of patient's fever.  Consultants None  Procedures  Renal US  Antibiotics   Anti-infectives (From admission, onward)   Start     Dose/Rate Route Frequency Ordered Stop   11/17/17 0800  vancomycin (VANCOCIN) IVPB 1000 mg/200 mL premix  Status:  Discontinued     1,000 mg 200 mL/hr over 60 Minutes Intravenous Every 24 hours 11/16/17 0005 11/16/17 0121   11/17/17 0600  piperacillin-tazobactam (ZOSYN) IVPB 3.375 g  Status:  Discontinued     3.375 g 12.5 mL/hr over 240 Minutes Intravenous Every 8 hours 11/16/17 0005 11/16/17 0121   11/16/17 0800  cefTRIAXone (ROCEPHIN) 1 g in dextrose 5 % 50 mL IVPB     1 g 100 mL/hr over 30 Minutes Intravenous Every 24 hours 11/16/17 0122     11/15/17 2315  piperacillin-tazobactam (ZOSYN) IVPB 3.375 g     3.375 g 100 mL/hr over 30 Minutes Intravenous  Once 11/15/17 2314 11/16/17 0021   11/15/17 2315  vancomycin (VANCOCIN) IVPB 1000 mg/200 mL premix     1,000 mg 200 mL/hr over 60 Minutes Intravenous  Once 11/15/17 2314 11/16/17 0113      Subjective:   Lauren Chung seen and examined today.  Wishes to go home however know she had a fever overnight. Denies further pain, nausea, vomiting, diarrhea, chest pain, shortness of breath, dizziness.  Objective:   Vitals:   11/18/17 0449  11/18/17 0536 11/18/17 0634 11/18/17 0715  BP: (!) 129/110 116/74    Pulse: (!) 111     Resp: 18     Temp: (!) 100.7 F (38.2 C) (!) 102.2 F (39 C) (!) 102.4 F (39.1 C) (!) 102.1 F  (38.9 C)  TempSrc: Rectal Rectal Rectal Rectal  SpO2:      Weight:      Height:        Intake/Output Summary (Last 24 hours) at 11/18/2017 0828 Last data filed at 11/18/2017 0640 Gross per 24 hour  Intake 3405 ml  Output 1400 ml  Net 2005 ml   Filed Weights   11/15/17 2259  Weight: 72.6 kg (160 lb)    Exam  General: Well developed, well nourished, NAD, appears stated age  46: NCAT, mucous membranes moist.   Cardiovascular: S1 S2 auscultated, RRR, no murmur  Respiratory: Clear to auscultation bilaterally with equal chest rise, no wheezing   Abdomen: Soft, nontender, nondistended, + bowel sounds  Extremities: warm dry without cyanosis clubbing or edema  Neuro: AAOx3, nonfocal  Psych Appropriate mood and affect, pleasant   Data Reviewed: I have personally reviewed following labs and imaging studies  CBC: Recent Labs  Lab 11/15/17 2309 11/16/17 0428 11/17/17 0526 11/18/17 0334  WBC 27.4* 18.5* 14.2* 8.8  NEUTROABS 23.8*  --   --   --   HGB 12.1 9.9* 9.6* 9.8*  HCT 36.8 30.9* 29.4* 30.2*  MCV 82.7 81.1 80.8 81.2  PLT 324 269 261 623   Basic Metabolic Panel: Recent Labs  Lab 11/15/17 2309 11/16/17 0428 11/16/17 1328 11/17/17 0526 11/18/17 0334  NA 135 135 134* 136 138  K 3.4* 2.5* 3.6 3.2* 3.1*  CL 97* 105 105 106 107  CO2 18* 20* 19* 20* 21*  GLUCOSE 89 93 96 87 105*  BUN 14 11 9 12 9   CREATININE 1.75* 1.44* 1.25* 1.23* 0.93  CALCIUM 8.8* 7.3* 7.7* 7.7* 7.9*  MG  --   --   --  1.6* 1.9   GFR: Estimated Creatinine Clearance: 73 mL/min (by C-G formula based on SCr of 0.93 mg/dL). Liver Function Tests: Recent Labs  Lab 11/15/17 2309  AST 34  ALT 20  ALKPHOS 85  BILITOT 2.3*  PROT 7.3  ALBUMIN 3.3*   No results for input(s): LIPASE, AMYLASE in the last 168 hours. No results for input(s): AMMONIA in the last 168 hours. Coagulation Profile: Recent Labs  Lab 11/15/17 2309 11/16/17 0428 11/16/17 1638 11/17/17 0526 11/18/17 0334  INR  2.60 3.05 3.57 2.44 2.30   Cardiac Enzymes: No results for input(s): CKTOTAL, CKMB, CKMBINDEX, TROPONINI in the last 168 hours. BNP (last 3 results) No results for input(s): PROBNP in the last 8760 hours. HbA1C: No results for input(s): HGBA1C in the last 72 hours. CBG: Recent Labs  Lab 11/17/17 0746 11/18/17 0736  GLUCAP 78 81   Lipid Profile: No results for input(s): CHOL, HDL, LDLCALC, TRIG, CHOLHDL, LDLDIRECT in the last 72 hours. Thyroid Function Tests: No results for input(s): TSH, T4TOTAL, FREET4, T3FREE, THYROIDAB in the last 72 hours. Anemia Panel: No results for input(s): VITAMINB12, FOLATE, FERRITIN, TIBC, IRON, RETICCTPCT in the last 72 hours. Urine analysis:    Component Value Date/Time   COLORURINE YELLOW 11/16/2017 0050   APPEARANCEUR HAZY (A) 11/16/2017 0050   LABSPEC 1.004 (L) 11/16/2017 0050   PHURINE 7.0 11/16/2017 0050   GLUCOSEU NEGATIVE 11/16/2017 0050   HGBUR LARGE (A) 11/16/2017 0050   BILIRUBINUR NEGATIVE 11/16/2017 0050  KETONESUR 5 (A) 11/16/2017 0050   PROTEINUR NEGATIVE 11/16/2017 0050   NITRITE NEGATIVE 11/16/2017 0050   LEUKOCYTESUR LARGE (A) 11/16/2017 0050   Sepsis Labs: @LABRCNTIP (procalcitonin:4,lacticidven:4)  ) Recent Results (from the past 240 hour(s))  Culture, blood (Routine x 2)     Status: None (Preliminary result)   Collection Time: 11/15/17 11:15 PM  Result Value Ref Range Status   Specimen Description BLOOD RIGHT ANTECUBITAL  Final   Special Requests   Final    BOTTLES DRAWN AEROBIC AND ANAEROBIC Blood Culture adequate volume   Culture NO GROWTH 2 DAYS  Final   Report Status PENDING  Incomplete  Culture, blood (Routine x 2)     Status: None (Preliminary result)   Collection Time: 11/15/17 11:58 PM  Result Value Ref Range Status   Specimen Description BLOOD RIGHT ANTECUBITAL  Final   Special Requests   Final    BOTTLES DRAWN AEROBIC AND ANAEROBIC Blood Culture adequate volume   Culture NO GROWTH 1 DAY  Final   Report  Status PENDING  Incomplete  Urine Culture     Status: Abnormal (Preliminary result)   Collection Time: 11/16/17 12:50 AM  Result Value Ref Range Status   Specimen Description URINE, RANDOM  Final   Special Requests NONE  Final   Culture (A)  Final    30,000 COLONIES/mL ESCHERICHIA COLI SUSCEPTIBILITIES TO FOLLOW    Report Status PENDING  Incomplete      Radiology Studies: No results found.   Scheduled Meds: . desipramine  50 mg Oral Daily  . feeding supplement (ENSURE ENLIVE)  237 mL Oral BID BM  . Influenza vac split quadrivalent PF  0.5 mL Intramuscular Tomorrow-1000  . Warfarin - Pharmacist Dosing Inpatient   Does not apply q1800   Continuous Infusions: . sodium chloride 150 mL/hr at 11/18/17 0721  . cefTRIAXone (ROCEPHIN)  IV 1 g (11/18/17 0755)     LOS: 2 days   Time Spent in minutes   45 minutes  Tevin Shillingford D.O. on 11/18/2017 at 8:28 AM  Between 7am to 7pm - Pager - [phone]  After 7pm go to [email or web address] - password [email or web address]  And look for the night coverage person covering for me after hours  [facility] Office  [phone]

## 2017-11-18 NOTE — Progress Notes (Signed)
ANTICOAGULATION CONSULT NOTE - [facility] Indication: Hx of PE  Allergies  Allergen Reactions  . Codeine Shortness Of Breath  . Contrast Media [Iodinated Diagnostic Agents] Shortness Of Breath  . Tylenol [Acetaminophen] Shortness Of Breath    Patient Measurements: Height: 5\' 8"  (172.7 cm) Weight: 160 lb (72.6 kg) IBW/kg (Calculated) : 63.9  Vital Signs: Temp: 100.6 F (38.1 C) (01/16 0918) Temp Source: Rectal (01/16 0918) BP: 116/74 (01/16 0536) Pulse Rate: 111 (01/16 0449)  Labs: Recent Labs    11/16/17 0428 11/16/17 1328 11/16/17 1638 11/17/17 0526 11/18/17 0334  HGB 9.9*  --   --  9.6* 9.8*  HCT 30.9*  --   --  29.4* 30.2*  PLT 269  --   --  261 328  LABPROT 31.3*  --  35.4* 26.3* 25.1*  INR 3.05  --  3.57 2.44 2.30  CREATININE 1.44* 1.25*  --  1.23* 0.93    Estimated Creatinine Clearance: 73 mL/min (by C-G formula based on SCr of 0.93 mg/dL).   Medical History: Past Medical History:  Diagnosis Date  . Anxiety   . Depression   . Lupus   . PE (pulmonary embolism)   . PTSD (post-traumatic stress disorder)   . Stroke ([place])   . TIA (transient ischemic attack)     Medications:  Coumadin PTA 2.5mg  daily except 5mg  MWF  Assessment: 52 year old female admitted with fever and flank pain who was on Coumadin PTA for hx PE and stroke. Patient was found to have positive hCG pregnancy test in ED and warfarin was discontinued. Pharmacy was consulted to start Lovenox.  Urine preg test actually negative.  Spoke with Dr. Ree Kida, Coumadin resumed 1/15.  Pt only missed one dose of Coumadin due to above events.  INR remains therapeutic, but trending down.  Goal of Therapy:  INR 2-3 Monitor platelets by anticoagulation protocol: Yes   Plan:  Coumadin 5mg  PO x 1 tonight (home dose). Daily INR  [employer], Pharm.D., BCPS Clinical Pharmacist Pager: [phone] Clinical phone for 11/18/2017 from 8:30-4:00 is  [phone]. After 4pm, please call Main Rx [phone]) for assistance. 11/18/2017 12:01 PM

## 2017-11-19 ENCOUNTER — Inpatient Hospital Stay (HOSPITAL_COMMUNITY): Payer: Medicare Other

## 2017-11-19 LAB — HEPATIC FUNCTION PANEL
ALK PHOS: 67 U/L (ref 38–126)
ALT: 22 U/L (ref 14–54)
AST: 27 U/L (ref 15–41)
Albumin: 2.3 g/dL — ABNORMAL LOW (ref 3.5–5.0)
BILIRUBIN DIRECT: 0.1 mg/dL (ref 0.1–0.5)
BILIRUBIN INDIRECT: 0.4 mg/dL (ref 0.3–0.9)
BILIRUBIN TOTAL: 0.5 mg/dL (ref 0.3–1.2)
TOTAL PROTEIN: 6 g/dL — AB (ref 6.5–8.1)

## 2017-11-19 LAB — CBC
HCT: 30.3 % — ABNORMAL LOW (ref 36.0–46.0)
HEMOGLOBIN: 10.1 g/dL — AB (ref 12.0–15.0)
MCH: 26.7 pg (ref 26.0–34.0)
MCHC: 33.3 g/dL (ref 30.0–36.0)
MCV: 80.2 fL (ref 78.0–100.0)
Platelets: 400 10*3/uL (ref 150–400)
RBC: 3.78 MIL/uL — AB (ref 3.87–5.11)
RDW: 16.7 % — ABNORMAL HIGH (ref 11.5–15.5)
WBC: 10.1 10*3/uL (ref 4.0–10.5)

## 2017-11-19 LAB — BASIC METABOLIC PANEL
Anion gap: 13 (ref 5–15)
CO2: 21 mmol/L — AB (ref 22–32)
Calcium: 8 mg/dL — ABNORMAL LOW (ref 8.9–10.3)
Chloride: 102 mmol/L (ref 101–111)
Creatinine, Ser: 0.96 mg/dL (ref 0.44–1.00)
GFR calc Af Amer: >60 mL/min (ref >60)
GLUCOSE: 91 mg/dL (ref 65–99)
POTASSIUM: 3.4 mmol/L — AB (ref 3.5–5.1)
Sodium: 136 mmol/L (ref 135–145)

## 2017-11-19 LAB — PROTIME-INR
INR: 2.29
PROTHROMBIN TIME: 25 s — AB (ref 11.4–15.2)

## 2017-11-19 LAB — MAGNESIUM: Magnesium: 1.3 mg/dL — ABNORMAL LOW (ref 1.7–2.4)

## 2017-11-19 MED ORDER — IBUPROFEN 400 MG PO TABS
400.0000 mg | ORAL_TABLET | Freq: Once | ORAL | Status: AC
  Administered 2017-11-19: 400 mg via ORAL
  Filled 2017-11-19: qty 1

## 2017-11-19 MED ORDER — POTASSIUM CHLORIDE CRYS ER 20 MEQ PO TBCR
40.0000 meq | EXTENDED_RELEASE_TABLET | Freq: Once | ORAL | Status: AC
  Administered 2017-11-19: 40 meq via ORAL
  Filled 2017-11-19: qty 2

## 2017-11-19 MED ORDER — ZOLPIDEM TARTRATE 5 MG PO TABS
5.0000 mg | ORAL_TABLET | Freq: Every evening | ORAL | Status: DC | PRN
  Administered 2017-11-19: 5 mg via ORAL
  Filled 2017-11-19: qty 1

## 2017-11-19 MED ORDER — WARFARIN SODIUM 2.5 MG PO TABS
2.5000 mg | ORAL_TABLET | Freq: Once | ORAL | Status: AC
  Administered 2017-11-19: 2.5 mg via ORAL
  Filled 2017-11-19: qty 1

## 2017-11-19 MED ORDER — SODIUM CHLORIDE 0.9 % IV BOLUS (SEPSIS)
500.0000 mL | Freq: Once | INTRAVENOUS | Status: AC
  Administered 2017-11-19: 500 mL via INTRAVENOUS

## 2017-11-19 MED ORDER — POTASSIUM CHLORIDE CRYS ER 10 MEQ PO TBCR
10.0000 meq | EXTENDED_RELEASE_TABLET | Freq: Once | ORAL | Status: AC
  Administered 2017-11-19: 10 meq via ORAL
  Filled 2017-11-19: qty 1

## 2017-11-19 MED ORDER — MAGNESIUM SULFATE 4 GM/100ML IV SOLN
4.0000 g | Freq: Once | INTRAVENOUS | Status: AC
  Administered 2017-11-19: 4 g via INTRAVENOUS
  Filled 2017-11-19: qty 100

## 2017-11-19 MED ORDER — BARIUM SULFATE 2.1 % PO SUSP
ORAL | Status: AC
  Administered 2017-11-19: 11:00:00
  Filled 2017-11-19: qty 2

## 2017-11-19 NOTE — Progress Notes (Signed)
PROGRESS NOTE    Lauren Chung  BJS:[id number] DOB: 1966-08-21 DOA: 11/15/2017 PCP: Damaris Hippo, MD    Brief Narrative: 52 year old female history of hypertension, CVA, TIA, depression with anxiety, PTSD, pulmonary embolism on Coumadin, lupus, presented with complaints of flank pain hematuria fevers and chills. Patient found to have positive UA. Currently being treated for sepsis secondary to acute pyelonephritis. Patient was found to have a positive hCG of 7, however urine pregnancy test was negative.     Assessment & Plan:   Principal Problem:   Pyelonephritis Active Problems:   Stroke ([place])   Pulmonary embolism ([facility])   Lupus   Depression with anxiety   AKI (acute kidney injury) ([place])   Sepsis ([place])   Hypokalemia   HTN (hypertension)   Sepsis, pyelonephritis.  UA with too numerous to count WBC>  E coli 30,000 sensitive to ceftriaxone.  [place], negative for stone.   Fevers;  Persist, initially thought to be related to pyelo.  Will check CT abdomen and pelvis.  LFT normal.  Repeat UA.   Hypokalemia, hypomagnesemia; replete   AKI; cr peak up to 1.7.  Resolved with IV fluids.   Positive HCG -Patient had a positive quantitative hCG of 7 however urine pregnancy test was negative -Patient is currently sexually active, however unlikely to have pregnancy. Patient states her boyfriend a vasectomy and reports having regular menstrual cycles. -Continue to monitor hCG  History of pulmonary embolism -Currently takes Coumadin however was switched to Lovenox given her slightly positive hCG. -lovenox discontinued and coumadin restarted on 11/17/17. INR 2.2  Lupus; suppose to be on plaquenil. Lost refilled.  Hold plaquenil for now due to infection.   DVT prophylaxis: coumadin  Code Status: full code.  Family Communication: D/W patient.  Disposition Plan: remain in patient due to fever.   Consultants:   none   Procedures:   US renal    Antimicrobials: ceftriaxone      Subjective: She is feeling weak. Denies dyspnea.    Objective: Vitals:   11/19/17 0400 11/19/17 0512 11/19/17 0600 11/19/17 0741  BP:  120/79    Pulse:  (!) 116    Resp:  18    Temp: (!) 103.1 F (39.5 C)  (!) 102.2 F (39 C) 98 F (36.7 C)  TempSrc: Axillary  Axillary Axillary  SpO2:  (!) 83%    Weight:      Height:        Intake/Output Summary (Last 24 hours) at 11/19/2017 0955 Last data filed at 11/19/2017 6073 Gross per 24 hour  Intake 4797.5 ml  Output 1550 ml  Net 3247.5 ml   Filed Weights   11/15/17 2259  Weight: 72.6 kg (160 lb)    Examination:  General exam: Appears calm and comfortable  Respiratory system: Clear to auscultation. Respiratory effort normal. Cardiovascular system: S1 & S2 heard, RRR. No JVD, murmurs, rubs, gallops or clicks. No pedal edema. Gastrointestinal system: Abdomen is distended, soft and nontender. No organomegaly or masses felt. Normal bowel sounds heard. Central nervous system: Alert and oriented. No focal neurological deficits. Extremities: Symmetric 5 x 5 power. Skin: No rashes, lesions or ulcers    Data Reviewed: I have personally reviewed following labs and imaging studies  CBC: Recent Labs  Lab 11/15/17 2309 11/16/17 0428 11/17/17 0526 11/18/17 0334 11/19/17 0415  WBC 27.4* 18.5* 14.2* 8.8 10.1  NEUTROABS 23.8*  --   --   --   --   HGB 12.1 9.9* 9.6* 9.8* 10.1*  HCT 36.8  30.9* 29.4* 30.2* 30.3*  MCV 82.7 81.1 80.8 81.2 80.2  PLT 324 269 261 328 144   Basic Metabolic Panel: Recent Labs  Lab 11/16/17 0428 11/16/17 1328 11/17/17 0526 11/18/17 0334 11/19/17 0415  NA 135 134* 136 138 136  K 2.5* 3.6 3.2* 3.1* 3.4*  CL 105 105 106 107 102  CO2 20* 19* 20* 21* 21*  GLUCOSE 93 96 87 105* 91  BUN 11 9 12 9  <5*  CREATININE 1.44* 1.25* 1.23* 0.93 0.96  CALCIUM 7.3* 7.7* 7.7* 7.9* 8.0*  MG  --   --  1.6* 1.9 1.3*   GFR: Estimated Creatinine Clearance: 70.7 mL/min (by C-G formula based on SCr of 0.96  mg/dL). Liver Function Tests: Recent Labs  Lab 11/15/17 2309  AST 34  ALT 20  ALKPHOS 85  BILITOT 2.3*  PROT 7.3  ALBUMIN 3.3*   No results for input(s): LIPASE, AMYLASE in the last 168 hours. No results for input(s): AMMONIA in the last 168 hours. Coagulation Profile: Recent Labs  Lab 11/16/17 0428 11/16/17 1638 11/17/17 0526 11/18/17 0334 11/19/17 0415  INR 3.05 3.57 2.44 2.30 2.29   Cardiac Enzymes: No results for input(s): CKTOTAL, CKMB, CKMBINDEX, TROPONINI in the last 168 hours. BNP (last 3 results) No results for input(s): PROBNP in the last 8760 hours. HbA1C: No results for input(s): HGBA1C in the last 72 hours. CBG: Recent Labs  Lab 11/17/17 0746 11/18/17 0736  GLUCAP 78 81   Lipid Profile: No results for input(s): CHOL, HDL, LDLCALC, TRIG, CHOLHDL, LDLDIRECT in the last 72 hours. Thyroid Function Tests: No results for input(s): TSH, T4TOTAL, FREET4, T3FREE, THYROIDAB in the last 72 hours. Anemia Panel: No results for input(s): VITAMINB12, FOLATE, FERRITIN, TIBC, IRON, RETICCTPCT in the last 72 hours. Sepsis Labs: Recent Labs  Lab 11/15/17 2321 11/16/17 0052 11/16/17 0141 11/16/17 0142 11/16/17 0428  PROCALCITON  --   --  0.57  --   --   LATICACIDVEN 10.51* 3.10*  --  2.0* 0.7    Recent Results (from the past 240 hour(s))  Culture, blood (Routine x 2)     Status: None (Preliminary result)   Collection Time: 11/15/17 11:15 PM  Result Value Ref Range Status   Specimen Description BLOOD RIGHT ANTECUBITAL  Final   Special Requests   Final    BOTTLES DRAWN AEROBIC AND ANAEROBIC Blood Culture adequate volume   Culture NO GROWTH 4 DAYS  Final   Report Status PENDING  Incomplete  Culture, blood (Routine x 2)     Status: None (Preliminary result)   Collection Time: 11/15/17 11:58 PM  Result Value Ref Range Status   Specimen Description BLOOD RIGHT ANTECUBITAL  Final   Special Requests   Final    BOTTLES DRAWN AEROBIC AND ANAEROBIC Blood Culture  adequate volume   Culture NO GROWTH 3 DAYS  Final   Report Status PENDING  Incomplete  Urine Culture     Status: Abnormal   Collection Time: 11/16/17 12:50 AM  Result Value Ref Range Status   Specimen Description URINE, RANDOM  Final   Special Requests NONE  Final   Culture 30,000 COLONIES/mL ESCHERICHIA COLI (A)  Final   Report Status 11/18/2017 FINAL  Final   Organism ID, Bacteria ESCHERICHIA COLI (A)  Final      Susceptibility   Escherichia coli - MIC*    AMPICILLIN >=32 RESISTANT Resistant     CEFAZOLIN <=4 SENSITIVE Sensitive     CEFTRIAXONE <=1 SENSITIVE Sensitive  CIPROFLOXACIN <=0.25 SENSITIVE Sensitive     GENTAMICIN <=1 SENSITIVE Sensitive     IMIPENEM <=0.25 SENSITIVE Sensitive     NITROFURANTOIN 64 INTERMEDIATE Intermediate     TRIMETH/SULFA <=20 SENSITIVE Sensitive     AMPICILLIN/SULBACTAM 16 INTERMEDIATE Intermediate     PIP/TAZO <=4 SENSITIVE Sensitive     Extended ESBL NEGATIVE Sensitive     * 30,000 COLONIES/mL ESCHERICHIA COLI         Radiology Studies: No results found.      Scheduled Meds: . desipramine  50 mg Oral Daily  . feeding supplement (ENSURE ENLIVE)  237 mL Oral BID BM  . Influenza vac split quadrivalent PF  0.5 mL Intramuscular Tomorrow-1000  . potassium chloride  40 mEq Oral Daily  . potassium chloride  40 mEq Oral Once  . Warfarin - Pharmacist Dosing Inpatient   Does not apply q1800   Continuous Infusions: . sodium chloride 150 mL/hr at 11/19/17 0623  . cefTRIAXone (ROCEPHIN)  IV Stopped (11/18/17 4920)  . magnesium sulfate 1 - 4 g bolus IVPB 4 g (11/19/17 0658)     LOS: 3 days    Time spent: 35 minutes.     Elmarie Shiley, MD [facility] Pager [phone]  If 7PM-7AM, please contact night-coverage [email or web address] Password [email or web address] 11/19/2017, 9:55 AM

## 2017-11-19 NOTE — Progress Notes (Signed)
Pt is alert and oriented. Pt denies pain at this time. She is ambulating without assistance around the room and to the bathroom. Pt denies any further needs at this time. Bed is in lowest position, and call bell is within reach. Will continue to monitor and follow physician's orders.

## 2017-11-19 NOTE — Progress Notes (Signed)
ANTICOAGULATION CONSULT NOTE - [facility] Indication: Hx of PE  Allergies  Allergen Reactions  . Codeine Shortness Of Breath  . Contrast Media [Iodinated Diagnostic Agents] Shortness Of Breath  . Tylenol [Acetaminophen] Shortness Of Breath    Patient Measurements: Height: 5\' 8"  (172.7 cm) Weight: 160 lb (72.6 kg) IBW/kg (Calculated) : 63.9  Vital Signs: Temp: 98 F (36.7 C) (01/17 0741) Temp Source: Axillary (01/17 0741) BP: 120/79 (01/17 0512) Pulse Rate: 116 (01/17 0512)  Labs: Recent Labs    11/17/17 0526 11/18/17 0334 11/19/17 0415  HGB 9.6* 9.8* 10.1*  HCT 29.4* 30.2* 30.3*  PLT 261 328 400  LABPROT 26.3* 25.1* 25.0*  INR 2.44 2.30 2.29  CREATININE 1.23* 0.93 0.96    Estimated Creatinine Clearance: 70.7 mL/min (by C-G formula based on SCr of 0.96 mg/dL).   Medical History: Past Medical History:  Diagnosis Date  . Anxiety   . Depression   . Lupus   . PE (pulmonary embolism)   . PTSD (post-traumatic stress disorder)   . Stroke ([place])   . TIA (transient ischemic attack)     Medications:  Coumadin PTA 2.5mg  daily except 5mg  MWF  Assessment: 52 year old female admitted with fever and flank pain who was on Coumadin PTA for hx PE and stroke. Patient was found to have positive hCG pregnancy test in ED and warfarin was discontinued. Pharmacy was consulted to start Lovenox.  Urine preg test actually negative.  Spoke with Dr. Ree Kida, Coumadin resumed 1/15.  Pt only missed one dose of Coumadin due to above events.  INR remains therapeutic, but trending down.  Goal of Therapy:  INR 2-3 Monitor platelets by anticoagulation protocol: Yes   Plan:  Coumadin 2.5mg  PO x 1 tonight (home dose). Daily INR  [employer], Pharm.D., BCPS Clinical Pharmacist Pager: [phone] Clinical phone for 11/19/2017 from 8:30-4:00 is [phone]. After 4pm, please call Main Rx [phone]) for assistance. 11/19/2017 11:19 AM

## 2017-11-19 NOTE — Progress Notes (Signed)
Reported morning potassium of 3.4 and morning mag of 1.3 to [facility].

## 2017-11-20 LAB — CULTURE, BLOOD (ROUTINE X 2)
Culture: NO GROWTH
SPECIAL REQUESTS: ADEQUATE

## 2017-11-20 LAB — PROTIME-INR
INR: 2.45
Prothrombin Time: 26.4 seconds — ABNORMAL HIGH (ref 11.4–15.2)

## 2017-11-20 MED ORDER — SENNOSIDES-DOCUSATE SODIUM 8.6-50 MG PO TABS: 1.0000 | ORAL_TABLET | Freq: Every evening | ORAL | 0 refills | Status: DC | PRN

## 2017-11-20 MED ORDER — MAGNESIUM OXIDE 400 (241.3 MG) MG PO TABS: 200.0000 mg | ORAL_TABLET | Freq: Two times a day (BID) | ORAL | 0 refills | Status: DC

## 2017-11-20 MED ORDER — POTASSIUM CHLORIDE CRYS ER 20 MEQ PO TBCR: 40.0000 meq | EXTENDED_RELEASE_TABLET | Freq: Every day | ORAL | Status: DC

## 2017-11-20 MED ORDER — CEPHALEXIN 500 MG PO CAPS: 500.0000 mg | ORAL_CAPSULE | Freq: Two times a day (BID) | ORAL | 0 refills | Status: AC

## 2017-11-20 MED ORDER — WARFARIN SODIUM 5 MG PO TABS: 5.0000 mg | ORAL_TABLET | Freq: Once | ORAL | Status: DC

## 2017-11-20 MED ORDER — MAGNESIUM OXIDE 400 (241.3 MG) MG PO TABS
200.0000 mg | ORAL_TABLET | Freq: Two times a day (BID) | ORAL | Status: DC
  Administered 2017-11-20: 200 mg via ORAL
  Filled 2017-11-20: qty 1

## 2017-11-20 NOTE — Progress Notes (Signed)
Pt discharged to home. PIV removed, AVS reviewed. Pt to follow up with PCP. Pt left unit via wheelchair, with belongings in hand. Pt to be transported home by daughter and son in law.

## 2017-11-20 NOTE — Discharge Summary (Signed)
Physician Discharge Summary  Lauren Chung RFF:[id number] DOB: Mar 24, 1966 DOA: 11/15/2017  PCP: Damaris Hippo, MD  Admit date: 11/15/2017 Discharge date: 11/20/2017  Admitted From: Home  Disposition:  Home   Recommendations for Outpatient Follow-up:  1. Follow up with PCP in 1-2 weeks 2. Please obtain BMP/CBC in one week 3. Needs INR check  4. Needs referral to rheumatology.  5. Check Mg level.  6. Please repeat HCG    Discharge Condition: stable.  CODE STATUS: full code.  Diet recommendation: Heart Healthy  Brief/Interim Summary:  Brief Narrative: 52 year old female history of hypertension, CVA, TIA, depression with anxiety, PTSD, pulmonary embolism on Coumadin, lupus, presented with complaints of flank pain hematuria fevers and chills. Patient found to have positive UA. Currently being treated for sepsis secondary to acute pyelonephritis. Patient was found to have a positive hCG of 7, however urine pregnancy test was negative.     Assessment & Plan:   Principal Problem:   Pyelonephritis Active Problems:   Stroke ([place])   Pulmonary embolism ([facility])   Lupus   Depression with anxiety   AKI (acute kidney injury) ([place])   Sepsis ([place])   Hypokalemia   HTN (hypertension)   Sepsis, pyelonephritis.  UA with too numerous to count WBC>  E coli 30,000 sensitive to ceftriaxone.  [place], negative for stone.  treated with ceftriaxone for 6 days. Due to persistent fever, CT abdomen was ordered and it was negative.  patient is now afebrile for 24 hours. Plan to discharge today with 4 more days antibiotics.   Fevers;  Related to pyelo.  Ct abdomen negative. Chest x ray negative.  Repeated blood culture no growth  LFT normal.    Hypokalemia, hypomagnesemia; replete  will provide prescription for magnesium.   AKI; cr peak up to 1.7.  Resolved with IV fluids.   Positive HCG -Patient had a positive quantitative hCG of 7 however urine pregnancy test was negative -Patient  is currently sexually active, however unlikely to have pregnancy. Patient states her boyfriend a vasectomy and reports having regular menstrual cycles. -Continue to monitor hCG  History of pulmonary embolism -Currently takes Coumadin however was switched to Lovenox given her slightly positive hCG. -lovenox discontinued and coumadin restarted on 11/17/17. INR 2.2  Lupus; suppose to be on plaquenil. Lost refilled.  Hold plaquenil for now due to infection. needs rheumatologist      Discharge Diagnoses:  Principal Problem:   Pyelonephritis Active Problems:   Stroke [facility])   Pulmonary embolism ([facility])   Lupus   Depression with anxiety   AKI (acute kidney injury) ([place])   Sepsis ([place])   Hypokalemia   HTN (hypertension)    Discharge Instructions  Discharge Instructions    Diet - low sodium heart healthy   Complete by:  As directed    Increase activity slowly   Complete by:  As directed      Allergies as of 11/20/2017      Reactions   Codeine Shortness Of Breath   Contrast Media [iodinated Diagnostic Agents] Shortness Of Breath   Tylenol [acetaminophen] Shortness Of Breath      Medication List    STOP taking these medications   triamterene-hydrochlorothiazide 37.5-25 MG tablet Commonly known as:  MAXZIDE-25     TAKE these medications   alprazolam 2 MG tablet Commonly known as:  XANAX Take 1 tablet (2 mg total) by mouth at bedtime as needed for sleep.   cephALEXin 500 MG capsule Commonly known as:  KEFLEX Take 1 capsule (500  mg total) by mouth 2 (two) times daily for 4 days.   desipramine 25 MG tablet Commonly known as:  NORPRAMIN 2 tablets (50 mg) by mouth daily What changed:    how much to take  how to take this  when to take this  additional instructions   magnesium oxide 400 (241.3 Mg) MG tablet Commonly known as:  MAG-OX Take 0.5 tablets (200 mg total) by mouth 2 (two) times daily.   senna-docusate 8.6-50 MG tablet Commonly known as:   Senokot-S Take 1 tablet by mouth at bedtime as needed for mild constipation.   warfarin 5 MG tablet Commonly known as:  COUMADIN Take 1 tablet (5 mg total) by mouth daily at 6 PM. Adjust dose based on INR with primary care physician in 5 days What changed:    how much to take  when to take this  additional instructions   zolpidem 12.5 MG CR tablet Commonly known as:  AMBIEN CR Take 1 tablet (12.5 mg total) by mouth at bedtime as needed for sleep. What changed:  Another medication with the same name was removed. Continue taking this medication, and follow the directions you see here.      Follow-up Information    Damaris Hippo, MD Follow up.   Specialty:  Family Medicine Why:  please follow up with PCP for INR>  Contact information: [place] [id number] [phone]          Allergies  Allergen Reactions  . Codeine Shortness Of Breath  . Contrast Media [Iodinated Diagnostic Agents] Shortness Of Breath  . Tylenol [Acetaminophen] Shortness Of Breath    Consultations:  none   Procedures/Studies: Ct Abdomen Pelvis Wo Contrast  Result Date: 11/19/2017 CLINICAL DATA:  Fever, flank pain. EXAM: CT ABDOMEN AND PELVIS WITHOUT CONTRAST TECHNIQUE: Multidetector CT imaging of the abdomen and pelvis was performed following the standard protocol without IV contrast. COMPARISON:  None. FINDINGS: Lower chest: No acute abnormality. Hepatobiliary: No gallstones are noted.  Left hepatic cyst is noted. Pancreas: Unremarkable. No pancreatic ductal dilatation or surrounding inflammatory changes. Spleen: Normal in size without focal abnormality. Adrenals/Urinary Tract: Adrenal glands are unremarkable. Kidneys are normal, without renal calculi, focal lesion, or hydronephrosis. Bladder is unremarkable. Stomach/Bowel: Stomach is within normal limits. Appendix appears normal. No evidence of bowel wall thickening, distention, or inflammatory changes.  Vascular/Lymphatic: No significant vascular findings are present. No enlarged abdominal or pelvic lymph nodes. Reproductive: Uterus and bilateral adnexa are unremarkable. Other: No abdominal wall hernia or abnormality. No abdominopelvic ascites. Musculoskeletal: No acute or significant osseous findings. IMPRESSION: No significant abnormality seen in the abdomen or pelvis. Electronically Signed   By: Marijo Conception, M.D.   On: 11/19/2017 15:30   Dg Chest 2 View  Result Date: 11/19/2017 CLINICAL DATA:  Fever EXAM: CHEST  2 VIEW COMPARISON:  11/15/2017 chest radiograph. FINDINGS: Stable cardiomediastinal silhouette with normal heart size. No pneumothorax. No pleural effusion. Lungs appear clear, with no acute consolidative airspace disease and no pulmonary edema. IMPRESSION: No active cardiopulmonary disease. Electronically Signed   By: Ilona Sorrel M.D.   On: 11/19/2017 20:07   US Renal  Result Date: 11/16/2017 CLINICAL DATA:  Acute kidney injury.  Pyelonephritis. EXAM: RENAL / URINARY TRACT ULTRASOUND COMPLETE COMPARISON:  None. FINDINGS: Right Kidney: Length: 10.6 cm. Mildly echogenic right renal parenchyma. Normal right renal parenchymal thickness and right renal size. No right hydronephrosis. No right renal mass. Left Kidney: Length: 10.6 cm. Mildly echogenic left renal  parenchyma. Normal left renal parenchymal thickness and left renal size. No left hydronephrosis. No left renal mass. Bladder: Appears normal for degree of bladder distention. IMPRESSION: 1. No hydronephrosis. 2. Mildly echogenic normal size kidneys, indicative of nonspecific renal parenchymal disease of uncertain chronicity. 3. Normal bladder. Electronically Signed   By: Ilona Sorrel M.D.   On: 11/16/2017 02:42   Dg Chest Port 1 View  Result Date: 11/15/2017 CLINICAL DATA:  Fever EXAM: PORTABLE CHEST 1 VIEW COMPARISON:  12/07/2015 chest radiograph. FINDINGS: Stable cardiomediastinal silhouette with normal heart size. No pneumothorax.  No pleural effusion. Lungs appear clear, with no acute consolidative airspace disease and no pulmonary edema. IMPRESSION: No active disease. Electronically Signed   By: Ilona Sorrel M.D.   On: 11/15/2017 23:29     Subjective: She is feeling better  today   Discharge Exam: Vitals:   11/20/17 0541 11/20/17 0542  BP: 126/87   Pulse: (!) 121 (!) 116  Resp: 18   Temp: 99.6 F (37.6 C)   SpO2: 95% 100%   Vitals:   11/19/17 1435 11/19/17 2155 11/20/17 0541 11/20/17 0542  BP:  114/76 126/87   Pulse:  (!) 107 (!) 121 (!) 116  Resp:  16 18   Temp: 97.6 F (36.4 C) 98.1 F (36.7 C) 99.6 F (37.6 C)   TempSrc: Axillary Oral Oral   SpO2:  100% 95% 100%  Weight:      Height:        General: Pt is alert, awake, not in acute distress Cardiovascular: RRR, S1/S2 +, no rubs, no gallops Respiratory: CTA bilaterally, no wheezing, no rhonchi Abdominal: Soft, NT, ND, bowel sounds + Extremities: no edema, no cyanosis    The results of significant diagnostics from this hospitalization (including imaging, microbiology, ancillary and laboratory) are listed below for reference.     Microbiology: Recent Results (from the past 240 hour(s))  Culture, blood (Routine x 2)     Status: None   Collection Time: 11/15/17 11:15 PM  Result Value Ref Range Status   Specimen Description BLOOD RIGHT ANTECUBITAL  Final   Special Requests   Final    BOTTLES DRAWN AEROBIC AND ANAEROBIC Blood Culture adequate volume   Culture NO GROWTH 5 DAYS  Final   Report Status 11/20/2017 FINAL  Final  Culture, blood (Routine x 2)     Status: None (Preliminary result)   Collection Time: 11/15/17 11:58 PM  Result Value Ref Range Status   Specimen Description BLOOD RIGHT ANTECUBITAL  Final   Special Requests   Final    BOTTLES DRAWN AEROBIC AND ANAEROBIC Blood Culture adequate volume   Culture NO GROWTH 4 DAYS  Final   Report Status PENDING  Incomplete  Urine Culture     Status: Abnormal   Collection Time: 11/16/17  12:50 AM  Result Value Ref Range Status   Specimen Description URINE, RANDOM  Final   Special Requests NONE  Final   Culture 30,000 COLONIES/mL ESCHERICHIA COLI (A)  Final   Report Status 11/18/2017 FINAL  Final   Organism ID, Bacteria ESCHERICHIA COLI (A)  Final      Susceptibility   Escherichia coli - MIC*    AMPICILLIN >=32 RESISTANT Resistant     CEFAZOLIN <=4 SENSITIVE Sensitive     CEFTRIAXONE <=1 SENSITIVE Sensitive     CIPROFLOXACIN <=0.25 SENSITIVE Sensitive     GENTAMICIN <=1 SENSITIVE Sensitive     IMIPENEM <=0.25 SENSITIVE Sensitive     NITROFURANTOIN 64 INTERMEDIATE Intermediate  TRIMETH/SULFA <=20 SENSITIVE Sensitive     AMPICILLIN/SULBACTAM 16 INTERMEDIATE Intermediate     PIP/TAZO <=4 SENSITIVE Sensitive     Extended ESBL NEGATIVE Sensitive     * 30,000 COLONIES/mL ESCHERICHIA COLI  Culture, blood (routine x 2)     Status: None (Preliminary result)   Collection Time: 11/19/17 10:10 AM  Result Value Ref Range Status   Specimen Description BLOOD RIGHT HAND  Final   Special Requests IN PEDIATRIC BOTTLE Blood Culture adequate volume  Final   Culture NO GROWTH < 24 HOURS  Final   Report Status PENDING  Incomplete  Culture, blood (routine x 2)     Status: None (Preliminary result)   Collection Time: 11/19/17 10:14 AM  Result Value Ref Range Status   Specimen Description BLOOD RIGHT ARM  Final   Special Requests IN PEDIATRIC BOTTLE Blood Culture adequate volume  Final   Culture NO GROWTH < 24 HOURS  Final   Report Status PENDING  Incomplete     Labs: BNP (last 3 results) No results for input(s): BNP in the last 8760 hours. Basic Metabolic Panel: Recent Labs  Lab 11/16/17 0428 11/16/17 1328 11/17/17 0526 11/18/17 0334 11/19/17 0415  NA 135 134* 136 138 136  K 2.5* 3.6 3.2* 3.1* 3.4*  CL 105 105 106 107 102  CO2 20* 19* 20* 21* 21*  GLUCOSE 93 96 87 105* 91  BUN 11 9 12 9  <5*  CREATININE 1.44* 1.25* 1.23* 0.93 0.96  CALCIUM 7.3* 7.7* 7.7* 7.9* 8.0*   MG  --   --  1.6* 1.9 1.3*   Liver Function Tests: Recent Labs  Lab 11/15/17 2309 11/19/17 1010  AST 34 27  ALT 20 22  ALKPHOS 85 67  BILITOT 2.3* 0.5  PROT 7.3 6.0*  ALBUMIN 3.3* 2.3*   No results for input(s): LIPASE, AMYLASE in the last 168 hours. No results for input(s): AMMONIA in the last 168 hours. CBC: Recent Labs  Lab 11/15/17 2309 11/16/17 0428 11/17/17 0526 11/18/17 0334 11/19/17 0415  WBC 27.4* 18.5* 14.2* 8.8 10.1  NEUTROABS 23.8*  --   --   --   --   HGB 12.1 9.9* 9.6* 9.8* 10.1*  HCT 36.8 30.9* 29.4* 30.2* 30.3*  MCV 82.7 81.1 80.8 81.2 80.2  PLT 324 269 261 328 400   Cardiac Enzymes: No results for input(s): CKTOTAL, CKMB, CKMBINDEX, TROPONINI in the last 168 hours. BNP: Invalid input(s): POCBNP CBG: Recent Labs  Lab 11/17/17 0746 11/18/17 0736  GLUCAP 78 81   D-Dimer No results for input(s): DDIMER in the last 72 hours. Hgb A1c No results for input(s): HGBA1C in the last 72 hours. Lipid Profile No results for input(s): CHOL, HDL, LDLCALC, TRIG, CHOLHDL, LDLDIRECT in the last 72 hours. Thyroid function studies No results for input(s): TSH, T4TOTAL, T3FREE, THYROIDAB in the last 72 hours.  Invalid input(s): FREET3 Anemia work up No results for input(s): VITAMINB12, FOLATE, FERRITIN, TIBC, IRON, RETICCTPCT in the last 72 hours. Urinalysis    Component Value Date/Time   COLORURINE YELLOW 11/16/2017 0050   APPEARANCEUR HAZY (A) 11/16/2017 0050   LABSPEC 1.004 (L) 11/16/2017 0050   PHURINE 7.0 11/16/2017 0050   GLUCOSEU NEGATIVE 11/16/2017 0050   HGBUR LARGE (A) 11/16/2017 0050   BILIRUBINUR NEGATIVE 11/16/2017 0050   KETONESUR 5 (A) 11/16/2017 0050   PROTEINUR NEGATIVE 11/16/2017 0050   NITRITE NEGATIVE 11/16/2017 0050   LEUKOCYTESUR LARGE (A) 11/16/2017 0050   Sepsis Labs Invalid input(s): PROCALCITONIN,  WBC,  [place] [facility] Recent Results (from the past 240 hour(s))  Culture, blood (Routine x 2)     Status: None    Collection Time: 11/15/17 11:15 PM  Result Value Ref Range Status   Specimen Description BLOOD RIGHT ANTECUBITAL  Final   Special Requests   Final    BOTTLES DRAWN AEROBIC AND ANAEROBIC Blood Culture adequate volume   Culture NO GROWTH 5 DAYS  Final   Report Status 11/20/2017 FINAL  Final  Culture, blood (Routine x 2)     Status: None (Preliminary result)   Collection Time: 11/15/17 11:58 PM  Result Value Ref Range Status   Specimen Description BLOOD RIGHT ANTECUBITAL  Final   Special Requests   Final    BOTTLES DRAWN AEROBIC AND ANAEROBIC Blood Culture adequate volume   Culture NO GROWTH 4 DAYS  Final   Report Status PENDING  Incomplete  Urine Culture     Status: Abnormal   Collection Time: 11/16/17 12:50 AM  Result Value Ref Range Status   Specimen Description URINE, RANDOM  Final   Special Requests NONE  Final   Culture 30,000 COLONIES/mL ESCHERICHIA COLI (A)  Final   Report Status 11/18/2017 FINAL  Final   Organism ID, Bacteria ESCHERICHIA COLI (A)  Final      Susceptibility   Escherichia coli - MIC*    AMPICILLIN >=32 RESISTANT Resistant     CEFAZOLIN <=4 SENSITIVE Sensitive     CEFTRIAXONE <=1 SENSITIVE Sensitive     CIPROFLOXACIN <=0.25 SENSITIVE Sensitive     GENTAMICIN <=1 SENSITIVE Sensitive     IMIPENEM <=0.25 SENSITIVE Sensitive     NITROFURANTOIN 64 INTERMEDIATE Intermediate     TRIMETH/SULFA <=20 SENSITIVE Sensitive     AMPICILLIN/SULBACTAM 16 INTERMEDIATE Intermediate     PIP/TAZO <=4 SENSITIVE Sensitive     Extended ESBL NEGATIVE Sensitive     * 30,000 COLONIES/mL ESCHERICHIA COLI  Culture, blood (routine x 2)     Status: None (Preliminary result)   Collection Time: 11/19/17 10:10 AM  Result Value Ref Range Status   Specimen Description BLOOD RIGHT HAND  Final   Special Requests IN PEDIATRIC BOTTLE Blood Culture adequate volume  Final   Culture NO GROWTH < 24 HOURS  Final   Report Status PENDING  Incomplete  Culture, blood (routine x 2)     Status: None  (Preliminary result)   Collection Time: 11/19/17 10:14 AM  Result Value Ref Range Status   Specimen Description BLOOD RIGHT ARM  Final   Special Requests IN PEDIATRIC BOTTLE Blood Culture adequate volume  Final   Culture NO GROWTH < 24 HOURS  Final   Report Status PENDING  Incomplete     Time coordinating discharge: Over 30 minutes  SIGNED:   Elmarie Shiley, MD  [facility] 11/20/2017, 3:02 PM Pager   If 7PM-7AM, please contact night-coverage [email or web address] Password [email or web address]

## 2017-11-20 NOTE — Progress Notes (Addendum)
ANTICOAGULATION CONSULT NOTE - [place] for warfarin Indication: Hx of PE  Allergies  Allergen Reactions  . Codeine Shortness Of Breath  . Contrast Media [Iodinated Diagnostic Agents] Shortness Of Breath  . Tylenol [Acetaminophen] Shortness Of Breath    Patient Measurements: Height: 5\' 8"  (172.7 cm) Weight: 160 lb (72.6 kg) IBW/kg (Calculated) : 63.9  Vital Signs: Temp: 99.6 F (37.6 C) (01/18 0541) Temp Source: Oral (01/18 0541) BP: 126/87 (01/18 0541) Pulse Rate: 116 (01/18 0542)  Labs: Recent Labs    11/18/17 0334 11/19/17 0415 11/20/17 0255  HGB 9.8* 10.1*  --   HCT 30.2* 30.3*  --   PLT 328 400  --   LABPROT 25.1* 25.0* 26.4*  INR 2.30 2.29 2.45  CREATININE 0.93 0.96  --     Estimated Creatinine Clearance: 70.7 mL/min (by C-G formula based on SCr of 0.96 mg/dL).  Medications:  Warfarin PTA 2.5mg  daily except 5mg  MWF  Assessment: 52 year old female admitted with fever and flank pain who was on warfarin PTA for hx PE and stroke. Patient was found to have 2 positive hCG pregnancy test in ED and warfarin was discontinued. Pharmacy was consulted to start Lovenox.  Urine preg test negative-warfarin resumed 1/15. Patient only missed one dose of warfari due to above events.   INR today is in range at 2.45. Hgb and plts stable. Noted patient reported small amount of blood in toilet this morning, but was flushed before staff could see.  Goal of Therapy:  INR 2-3 Monitor platelets by anticoagulation protocol: Yes   Plan:  Warfarin 5mg  po x1 tonight as per home dose Daily INR Will monitor CBC and any additional signs/symptoms of bleeding  Will follow discharge antibiotic plans and adjust warfarin as needed  Harlene Petralia D. Renne Platts, PharmD, BCPS Clinical Pharmacist Clinical Phone for 11/20/2017 until 3:30pm: [phone] If after 3:30pm, please call main pharmacy at [phone] 11/20/2017 9:01 AM

## 2017-11-20 NOTE — Progress Notes (Signed)
Pt reported small amount of blood in urine and stool, it was not witnessed by RN or NT. Pt instructed not to flush toilet next time. MD notified.

## 2017-11-21 LAB — CULTURE, BLOOD (ROUTINE X 2)
CULTURE: NO GROWTH
Special Requests: ADEQUATE

## 2017-11-24 LAB — CULTURE, BLOOD (ROUTINE X 2)
Culture: NO GROWTH
Culture: NO GROWTH
SPECIAL REQUESTS: ADEQUATE
SPECIAL REQUESTS: ADEQUATE

## 2017-12-07 ENCOUNTER — Ambulatory Visit (INDEPENDENT_AMBULATORY_CARE_PROVIDER_SITE_OTHER): Payer: Medicare Other | Admitting: Licensed Clinical Social Worker

## 2017-12-07 ENCOUNTER — Encounter (HOSPITAL_COMMUNITY): Payer: Self-pay | Admitting: Licensed Clinical Social Worker

## 2017-12-07 DIAGNOSIS — F332 Major depressive disorder, recurrent severe without psychotic features: Secondary | ICD-10-CM

## 2017-12-07 NOTE — Progress Notes (Signed)
   THERAPIST PROGRESS NOTE  Session Time: 4:45pm-5:30pm  Participation Level: Active  Behavioral Response: Well GroomedAlertDepressed  Type of Therapy: Individual Therapy  Treatment Goals addressed: improve psychiatric symptoms, elevate mood as evidenced by increased social interactions, increased interest, increased self-esteem, and improve unhelpful thought patterns. decrease irrational worries and fears.   Interventions: CBT and Motivational Interviewing psychoeducation, grounding and mindfulness techniques  Summary: Lauren Chung is a 52 y.o. female who presents with Major depressive disorder, recurrent episode, severe   Suicidal/Homicidal: Nowithout intent/plan  Therapist Response: Lauren Chung met with clinician for an individual session. Lauren Chung shared about her psychiatric symptoms, her current life events and her homework. Lauren Chung shared that she was hospitalized for a week following a kidney infection and sepsis. She reports she was taken off her medication for that week in order to regulate her system. She identified that since she has been back on meds, she feels a little better, but she reports decrease in motivation, refusal to open the blinds, refusal to come out of her room, and crying spells. Lauren Chung reports she feels guilty because her 51 year old so was just now officially diagnosed with Autism and she did not do anything to help him as a child. Lauren Chung processed thoughts and feelings. She also noted some proactivity following this diagnosis, including planning for her daughter to care for her son if she should die. Clinician discussed medications and noted that the medication would not make everything perfect and make her jump out of bed singing. Clinician explained that medication would make motivation a little easier to obtain, but it would still take work. Lauren Chung agreed to go outside with her dogs when they go out and to start going on an occasional walk. Clinician and Lauren Chung  updated treatment plan.   Plan: Return again in 1-2  weeks.  Diagnosis: Axis I: Major depressive disorder, recurrent episode, severe  Lauren Curling, LCSW 12/07/2017

## 2017-12-09 ENCOUNTER — Ambulatory Visit (HOSPITAL_COMMUNITY): Payer: Self-pay | Admitting: Psychiatry

## 2017-12-09 DIAGNOSIS — E349 Endocrine disorder, unspecified: Secondary | ICD-10-CM | POA: Diagnosis not present

## 2017-12-09 DIAGNOSIS — D473 Essential (hemorrhagic) thrombocythemia: Secondary | ICD-10-CM | POA: Diagnosis not present

## 2017-12-09 DIAGNOSIS — Z09 Encounter for follow-up examination after completed treatment for conditions other than malignant neoplasm: Secondary | ICD-10-CM | POA: Diagnosis not present

## 2017-12-09 DIAGNOSIS — N10 Acute tubulo-interstitial nephritis: Secondary | ICD-10-CM | POA: Diagnosis not present

## 2017-12-09 DIAGNOSIS — Z7901 Long term (current) use of anticoagulants: Secondary | ICD-10-CM | POA: Diagnosis not present

## 2017-12-15 ENCOUNTER — Other Ambulatory Visit (HOSPITAL_COMMUNITY): Payer: Self-pay

## 2017-12-15 MED ORDER — DESIPRAMINE HCL 25 MG PO TABS: 50.0000 mg | ORAL_TABLET | Freq: Every day | ORAL | 5 refills | Status: DC

## 2017-12-16 ENCOUNTER — Ambulatory Visit (INDEPENDENT_AMBULATORY_CARE_PROVIDER_SITE_OTHER): Payer: Medicare Other | Admitting: Psychiatry

## 2017-12-16 ENCOUNTER — Encounter (HOSPITAL_COMMUNITY): Payer: Self-pay | Admitting: Psychiatry

## 2017-12-16 VITALS — BP 122/68 | HR 90 | Ht 69.6 in | Wt 161.2 lb

## 2017-12-16 DIAGNOSIS — F4310 Post-traumatic stress disorder, unspecified: Secondary | ICD-10-CM | POA: Diagnosis not present

## 2017-12-16 DIAGNOSIS — Z813 Family history of other psychoactive substance abuse and dependence: Secondary | ICD-10-CM | POA: Diagnosis not present

## 2017-12-16 DIAGNOSIS — Z811 Family history of alcohol abuse and dependence: Secondary | ICD-10-CM | POA: Diagnosis not present

## 2017-12-16 DIAGNOSIS — Z636 Dependent relative needing care at home: Secondary | ICD-10-CM | POA: Diagnosis not present

## 2017-12-16 MED ORDER — ALPRAZOLAM 2 MG PO TABS: 2.0000 mg | ORAL_TABLET | Freq: Every evening | ORAL | 2 refills | Status: DC | PRN

## 2017-12-16 MED ORDER — ZOLPIDEM TARTRATE ER 12.5 MG PO TBCR: 12.5000 mg | EXTENDED_RELEASE_TABLET | Freq: Every evening | ORAL | 5 refills | Status: DC | PRN

## 2017-12-16 MED ORDER — DESIPRAMINE HCL 25 MG PO TABS
50.0000 mg | ORAL_TABLET | Freq: Every day | ORAL | 5 refills | Status: DC
Start: 2017-12-16 — End: 2018-04-07

## 2017-12-16 NOTE — Progress Notes (Signed)
Psychiatric Initial Adult Assessment   Patient Identification: Lauren Chung MRN:  [id number] Date of Evaluation:  12/16/2017 Referral Source [facility] Officer Chief Complaint:    Visit Diagnosis: No diagnosis found. Patient is at her baseline. She was hospitalized for sepsis. He's been out of the hospital for 2 weeks and is getting better. She still somewhat isolated. Generally she is sleeping and eating well long she takes her medicines. She now is compliant with taking her desipramine. It is not clear as all that helpful. The possibility of getting a blood level raise the dose should be considered her next visit. She's just started with a new therapist which is great. She feels connected. The patient is not suicidal. Patient uses no alcohol. Patient's involved in taking care of her 75 year old autistic son. The patient still has a boyfriend seasonal maybe once a week. Patient is limited by her lupus. She says it affects her energy and Effexor joints. Patient used to volunteer which is what would be a great thing for her to get back to. The patient is not happy with life and she once more. She says she's to isolate does engage is much as she should. She's not suicidal and she is functioning only fairly well. History of Present Illness:   sabotages her care. (Hypo) Manic Symptoms:   Anxiety Symptoms:   Psychotic Symptoms:   PTSD Symptoms:   Past Psychiatric History: Past therapy, one psychiatric hospitalization  Previous Psychotropic Medications: Yes   Substance Abuse History in the last 12 months:  No.  Consequences of Substance Abuse: Negative  Past Medical History:  Past Medical History:  Diagnosis Date  . Anxiety   . Depression   . Lupus   . PE (pulmonary embolism)   . PTSD (post-traumatic stress disorder)   . Stroke ([place])   . TIA (transient ischemic attack)     Past Surgical History:  Procedure Laterality Date  . ABDOMINAL SURGERY    . ABLATION      Family Psychiatric  History:   Family History:  Family History  Problem Relation Age of Onset  . Leukemia Father   . Seizures Daughter   . Drug abuse Mother   . Alcohol abuse Mother     Social History:   Social History   Socioeconomic History  . Marital status: Legally Separated    Spouse name: None  . Number of children: None  . Years of education: None  . Highest education level: None  Social Needs  . Financial resource strain: None  . Food insecurity - worry: None  . Food insecurity - inability: None  . Transportation needs - medical: None  . Transportation needs - non-medical: None  Occupational History  . None  Tobacco Use  . Smoking status: Never Smoker  . Smokeless tobacco: Never Used  Substance and Sexual Activity  . Alcohol use: No  . Drug use: No  . Sexual activity: Yes    Partners: Male    Birth control/protection: Condom  Other Topics Concern  . None  Social History Narrative  . None    Additional Social History:   Allergies:   Allergies  Allergen Reactions  . Codeine Shortness Of Breath  . Contrast Media [Iodinated Diagnostic Agents] Shortness Of Breath  . Tylenol [Acetaminophen] Shortness Of Breath    Metabolic Disorder Labs: No results found for: HGBA1C, MPG No results found for: PROLACTIN No results found for: CHOL, TRIG, HDL, CHOLHDL, VLDL, LDLCALC   Current Medications: Current Outpatient Medications  Medication  Sig Dispense Refill  . alprazolam (XANAX) 2 MG tablet Take 1 tablet (2 mg total) by mouth at bedtime as needed for sleep. 30 tablet 2  . desipramine (NORPRAMIN) 25 MG tablet Take 2 tablets (50 mg total) by mouth daily. 60 tablet 5  . magnesium oxide (MAG-OX) 400 (241.3 Mg) MG tablet Take 0.5 tablets (200 mg total) by mouth 2 (two) times daily. 10 tablet 0  . senna-docusate (SENOKOT-S) 8.6-50 MG tablet Take 1 tablet by mouth at bedtime as needed for mild constipation. 10 tablet 0  . warfarin (COUMADIN) 5 MG tablet Take 1 tablet (5 mg total) by  mouth daily at 6 PM. Adjust dose based on INR with primary care physician in 5 days (Patient taking differently: Take 2.5-5 mg by mouth daily. Take 5mg  by mouth on Monday, Wednesday, and Friday. Take 2.5mg  by mouth on Tuesday, Thursday, Saturday, and Sunday.) 30 tablet 0  . zolpidem (AMBIEN CR) 12.5 MG CR tablet Take 1 tablet (12.5 mg total) by mouth at bedtime as needed for sleep. 30 tablet 5   No current facility-administered medications for this visit.     Neurologic: Headache: No Seizure: No Paresthesias:No  Musculoskeletal: Strength & Muscle Tone: within normal limits Gait & Station: normal Patient leans: N/A  Psychiatric Specialty Exam: ROS  Blood pressure 122/68, pulse 90, height 5' 9.6" (1.768 m), weight 161 lb 3.2 oz (73.1 kg).Body mass index is 23.4 kg/m.  General Appearance: Casual  Eye Contact:  Good  Speech:  Clear and Coherent  Volume:  Normal  Mood:  Dysphoric  Affect:  Appropriate  Thought Process:  Goal Directed  Orientation:  NA  Thought Content:  Logical  Suicidal Thoughts:  No  Homicidal Thoughts:  No  Memory:  NA  Judgement:  Good  Insight:  Good  Psychomotor Activity:  Normal  Concentration:    Recall:  [place] of Knowledge:Fair  Language: Good  Akathisia:  No  Handed:  Right  AIMS (if indicated):    Assets:  Desire for Improvement  ADL's:  Intact  Cognition: WNL  Sleep:      Treatment Plan Summary: 2/13/20194:55 PM  At this time the patient is at her baseline. She did survive sepsis. Continue taking Xanax 2 mg of takingAmbien and 50 mg of desipramine. She'll continue in therapy. She's just started in therapy so I will give her a couple months before we consider increasing her adjusting any of her medicines.The patient seems happy in this room discussing things with me. She is pleasant and easy. She is showing minimal distress. Her problem is she is isolated a lot but she also describes this is her baseline.

## 2018-01-12 DIAGNOSIS — Z7901 Long term (current) use of anticoagulants: Secondary | ICD-10-CM | POA: Diagnosis not present

## 2018-01-27 ENCOUNTER — Ambulatory Visit (HOSPITAL_COMMUNITY): Payer: 59 | Admitting: Licensed Clinical Social Worker

## 2018-02-03 ENCOUNTER — Encounter (HOSPITAL_COMMUNITY): Payer: Self-pay | Admitting: Licensed Clinical Social Worker

## 2018-02-03 ENCOUNTER — Ambulatory Visit: Payer: No Typology Code available for payment source | Admitting: Family Medicine

## 2018-02-03 ENCOUNTER — Ambulatory Visit (INDEPENDENT_AMBULATORY_CARE_PROVIDER_SITE_OTHER): Payer: PRIVATE HEALTH INSURANCE | Admitting: Licensed Clinical Social Worker

## 2018-02-03 DIAGNOSIS — F4310 Post-traumatic stress disorder, unspecified: Secondary | ICD-10-CM

## 2018-02-03 DIAGNOSIS — F422 Mixed obsessional thoughts and acts: Secondary | ICD-10-CM | POA: Diagnosis not present

## 2018-02-03 NOTE — Progress Notes (Signed)
   THERAPIST PROGRESS NOTE  Session Time: 3:30pm-4:20pm  Participation Level: Active  Behavioral Response: Well GroomedAlertEuthymic  Type of Therapy: Individual Therapy  Treatment Goals addressed: improve psychiatric symptoms, elevate mood as evidenced by increased social interactions, increased interest, increased self-esteem, and improve unhelpful thought patterns. decrease irrational worries and fears as evidenced by the desire and ability to leave the house, shop with fear, decreased panic, discuss and process past traumas as evidenced by ability to discuss them without feeling overwhelmed. Client will learn about her diagnosis and learn and implement healthy coping skill.   Interventions: CBT and Motivational Interviewing psychoeducation, grounding and mindfulness techniques  Summary: Lauren Chung is a 52 y.o. female who presents with Major depressive disorder, recurrent episode, severe, and PTSD, and Mixed Obsessional Thoughts and Acts, and Panic Disorder.   Suicidal/Homicidal: Nowithout intent/plan  Therapist Response: Lauren Chung met with clinician for an individual session. Lauren Chung shared about her psychiatric symptoms, her current life events and her homework. Lauren Chung shared that she finally broke up with her boyfriend and she took a box of his belongings to his house and left it at the door. She identified that this act gave her a sense of freedom, like a weight had been lifted off her shoulders, and since then she has been feeling "really good". Lauren Chung also reports she adopted a new dog, which has brought back a lot of excitement and love into her heart. Clinician utilized [place] OARS to reflect thoughts, feelings, and behaviors, noting a new energy and light coming from Lauren Chung, which increased the size of her smile in session. Clinician explored other new things that have happened. 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 Clinician offered suggestions about ways to make friends with people in this group. Clinician also explored options for walking with her grandchildren and the dogs. Clinician posed options for increasing the life in her household and discussed possibility of offering a weekly meal (Sunday dinner) with family. Lauren Chung reported this would be very uncomfortable, but she may be able to increase her visitors a little.   Plan: Return again in 1-2  weeks.  Diagnosis: Axis I: Major depressive disorder, recurrent episode, severe, and PTSD, and Mixed Obsessional Thoughts and Acts, and Panic Disorder.                            Jobe Marker [place], LCSW 02/03/2018

## 2018-02-10 ENCOUNTER — Ambulatory Visit (HOSPITAL_COMMUNITY): Payer: 59 | Admitting: Licensed Clinical Social Worker

## 2018-02-11 ENCOUNTER — Ambulatory Visit (HOSPITAL_COMMUNITY): Payer: 59 | Admitting: Psychiatry

## 2018-02-17 ENCOUNTER — Ambulatory Visit (HOSPITAL_COMMUNITY): Payer: 59 | Admitting: Licensed Clinical Social Worker

## 2018-02-22 DIAGNOSIS — Z86711 Personal history of pulmonary embolism: Secondary | ICD-10-CM | POA: Diagnosis not present

## 2018-02-22 DIAGNOSIS — M329 Systemic lupus erythematosus, unspecified: Secondary | ICD-10-CM | POA: Diagnosis not present

## 2018-02-22 DIAGNOSIS — Z8673 Personal history of transient ischemic attack (TIA), and cerebral infarction without residual deficits: Secondary | ICD-10-CM | POA: Diagnosis not present

## 2018-02-22 DIAGNOSIS — Z79899 Other long term (current) drug therapy: Secondary | ICD-10-CM | POA: Diagnosis not present

## 2018-02-24 ENCOUNTER — Ambulatory Visit (INDEPENDENT_AMBULATORY_CARE_PROVIDER_SITE_OTHER): Payer: Medicare Other | Admitting: Licensed Clinical Social Worker

## 2018-02-24 ENCOUNTER — Encounter (HOSPITAL_COMMUNITY): Payer: Self-pay | Admitting: Licensed Clinical Social Worker

## 2018-02-24 DIAGNOSIS — F331 Major depressive disorder, recurrent, moderate: Secondary | ICD-10-CM | POA: Diagnosis not present

## 2018-02-24 DIAGNOSIS — F422 Mixed obsessional thoughts and acts: Secondary | ICD-10-CM | POA: Diagnosis not present

## 2018-02-24 DIAGNOSIS — F4310 Post-traumatic stress disorder, unspecified: Secondary | ICD-10-CM | POA: Diagnosis not present
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
## 2018-04-01 ENCOUNTER — Encounter (HOSPITAL_COMMUNITY): Payer: Self-pay | Admitting: Licensed Clinical Social Worker

## 2018-04-01 ENCOUNTER — Ambulatory Visit (INDEPENDENT_AMBULATORY_CARE_PROVIDER_SITE_OTHER): Payer: PRIVATE HEALTH INSURANCE | Admitting: Licensed Clinical Social Worker

## 2018-04-01 DIAGNOSIS — F331 Major depressive disorder, recurrent, moderate: Secondary | ICD-10-CM

## 2018-04-01 NOTE — Progress Notes (Signed)
   THERAPIST PROGRESS NOTE  Session Time: 12:30-1:30pm  Participation Level: Active  Behavioral Response: Well GroomedAlertDepressed  Type of Therapy: Individual Therapy  Treatment Goals addressed: improve psychiatric symptoms, elevate mood as evidenced by increased social interactions, increased interest, increased self-esteem, and improve unhelpful thought patterns. decrease irrational worries and fears as evidenced by the desire and ability to leave the house, shop with fear, decreased panic, discuss and process past traumas as evidenced by ability to discuss them without feeling overwhelmed. Client will learn about her diagnosis and learn and implement healthy coping skill.   Interventions: CBT and Motivational Interviewing psychoeducation, grounding and mindfulness techniques  Summary: Lauren Chung is a 52 y.o. female who presents with Major depressive disorder, recurrent episode, severe, and PTSD, and Mixed Obsessional Thoughts and Acts, and Panic Disorder.   Suicidal/Homicidal: Nowithout intent/plan  Therapist Response: Lauren Chung met with clinician for an individual session. Lauren Chung shared about her psychiatric symptoms, her current life events and her homework. Lauren Chung shared that she has been feeling like she wants to run away over the past two weeks. Lauren Chung identified that she has done this periodically, but not in a couple of years. Lauren Chung reported once she drove for 8 hours and stayed away for almost 2 weeks. Lauren Chung reported she does not want to actually disappear. Clinician explored ways for Lauren Chung to "run away" in a planned and sanctioned way, such as planning a vacation. Lauren Chung reported she did not think that would work because her children would want to go.  Clinician discussed ways to improve mood by changing behaviors. Clinician explored self esteem and noted the importance of self care in order to continue doing things for others.   Plan: Return again in 2   weeks.  Diagnosis: Axis I: Major depressive disorder, recurrent episode, severe, and PTSD, and Mixed Obsessional Thoughts and Acts, and Panic Disorder.  Jobe Marker [place], LCSW 04/01/2018

## 2018-04-07 ENCOUNTER — Ambulatory Visit (INDEPENDENT_AMBULATORY_CARE_PROVIDER_SITE_OTHER): Payer: Medicare Other | Admitting: Psychiatry

## 2018-04-07 ENCOUNTER — Encounter (HOSPITAL_COMMUNITY): Payer: Self-pay | Admitting: Psychiatry

## 2018-04-07 VITALS — BP 113/80 | HR 118 | Ht 69.0 in | Wt 174.0 lb

## 2018-04-07 DIAGNOSIS — M329 Systemic lupus erythematosus, unspecified: Secondary | ICD-10-CM | POA: Diagnosis not present

## 2018-04-07 DIAGNOSIS — F320 Major depressive disorder, single episode, mild: Secondary | ICD-10-CM | POA: Diagnosis not present

## 2018-04-07 DIAGNOSIS — Z915 Personal history of self-harm: Secondary | ICD-10-CM | POA: Diagnosis not present

## 2018-04-07 DIAGNOSIS — Z736 Limitation of activities due to disability: Secondary | ICD-10-CM | POA: Diagnosis not present

## 2018-04-07 DIAGNOSIS — F321 Major depressive disorder, single episode, moderate: Secondary | ICD-10-CM

## 2018-04-07 MED ORDER — ALPRAZOLAM 2 MG PO TABS: 2.0000 mg | ORAL_TABLET | Freq: Every evening | ORAL | 2 refills | Status: DC | PRN

## 2018-04-07 MED ORDER — DESIPRAMINE HCL 50 MG PO TABS: ORAL_TABLET | ORAL | 5 refills | Status: DC

## 2018-04-07 MED ORDER — ZOLPIDEM TARTRATE ER 12.5 MG PO TBCR: 12.5000 mg | EXTENDED_RELEASE_TABLET | Freq: Every evening | ORAL | 5 refills | Status: DC | PRN

## 2018-04-07 NOTE — Progress Notes (Signed)
Psychiatric Initial Adult Assessment   Patient Identification: Lauren Chung MRN:  [id number] Date of Evaluation:  04/07/2018 Referral Source [facility] Officer Chief Complaint:    Visit Diagnosis:    ICD-10-CM   1. Current mild episode of major depressive disorder without prior episode (HCC) F32.0 Desipramine level   Today the patient is seen on time. The patient is not doing well. She's more isolated. He seems more withdrawn. She admits to daily depression. She sleeps okay but only because she takes Ambien and Xanax. Her appetite is good. Her energy level is not too great and her concentration is not good. She denies being worthless.the patient denies being suicidal.She is drawn opinion suggested. She did make a suicide attempt 2 years ago overdose. The patients anxiety is relatively controlled. The patient enjoys watching TV a little else. The patient has her own home lives alone except for her son who has autism. He stays in his own room as the patient is well.Patient clearly demonstrated psychomotor slowing the patient is on disability for lupus. Is having chronic headaches. This is new in the last few weeks she has an upcoming appointment with a neurologist to help her deal with these headaches and the treatment of her lupus. Patient denies the use of alcohol or drugs. She is very withdrawn. The patient doesn't come to therapy at least once a month joint effusion she does herself out of her home.  sabotages her care. (Hypo) Manic Symptoms:   Anxiety Symptoms:   Psychotic Symptoms:   PTSD Symptoms:   Past Psychiatric History: Past therapy, one psychiatric hospitalization  Previous Psychotropic Medications: Yes   Substance Abuse History in the last 12 months:  No.  Consequences of Substance Abuse: Negative  Past Medical History:  Past Medical History:  Diagnosis Date  . Anxiety   . Depression   . Lupus ([place])   . PE (pulmonary embolism)   . PTSD (post-traumatic stress disorder)    . Stroke ([place])   . TIA (transient ischemic attack)     Past Surgical History:  Procedure Laterality Date  . ABDOMINAL SURGERY    . ABLATION      Family Psychiatric History:   Family History:  Family History  Problem Relation Age of Onset  . Leukemia Father   . Seizures Daughter   . Drug abuse Mother   . Alcohol abuse Mother     Social History:   Social History   Socioeconomic History  . Marital status: Legally Separated    Spouse name: Not on file  . Number of children: Not on file  . Years of education: Not on file  . Highest education level: Not on file  Occupational History  . Not on file  Social Needs  . Financial resource strain: Not on file  . Food insecurity:    Worry: Not on file    Inability: Not on file  . Transportation needs:    Medical: Not on file    Non-medical: Not on file  Tobacco Use  . Smoking status: Never Smoker  . Smokeless tobacco: Never Used  Substance and Sexual Activity  . Alcohol use: No  . Drug use: No  . Sexual activity: Yes    Partners: Male    Birth control/protection: Condom  Lifestyle  . Physical activity:    Days per week: Not on file    Minutes per session: Not on file  . Stress: Not on file  Relationships  . Social connections:    Talks on  phone: Not on file    Gets together: Not on file    Attends religious service: Not on file    Active member of club or organization: Not on file    Attends meetings of clubs or organizations: Not on file    Relationship status: Not on file  Other Topics Concern  . Not on file  Social History Narrative  . Not on file    Additional Social History:   Allergies:   Allergies  Allergen Reactions  . Codeine Shortness Of Breath  . Contrast Media [Iodinated Diagnostic Agents] Shortness Of Breath  . Tylenol [Acetaminophen] Shortness Of Breath    Metabolic Disorder Labs: No results found for: HGBA1C, MPG No results found for: PROLACTIN No results found for: CHOL, TRIG, HDL,  CHOLHDL, VLDL, LDLCALC   Current Medications: Current Outpatient Medications  Medication Sig Dispense Refill  . alprazolam (XANAX) 2 MG tablet Take 1 tablet (2 mg total) by mouth at bedtime as needed for sleep. 30 tablet 2  . desipramine (NORPRAMIN) 50 MG tablet 2  qam 60 tablet 5  . magnesium oxide (MAG-OX) 400 (241.3 Mg) MG tablet Take 0.5 tablets (200 mg total) by mouth 2 (two) times daily. 10 tablet 0  . senna-docusate (SENOKOT-S) 8.6-50 MG tablet Take 1 tablet by mouth at bedtime as needed for mild constipation. 10 tablet 0  . warfarin (COUMADIN) 5 MG tablet Take 1 tablet (5 mg total) by mouth daily at 6 PM. Adjust dose based on INR with primary care physician in 5 days (Patient taking differently: Take 2.5-5 mg by mouth daily. Take 5mg  by mouth on Monday, Wednesday, and Friday. Take 2.5mg  by mouth on Tuesday, Thursday, Saturday, and Sunday.) 30 tablet 0  . zolpidem (AMBIEN CR) 12.5 MG CR tablet Take 1 tablet (12.5 mg total) by mouth at bedtime as needed for sleep. 30 tablet 5   No current facility-administered medications for this visit.     Neurologic: Headache: No Seizure: No Paresthesias:No  Musculoskeletal: Strength & Muscle Tone: within normal limits Gait & Station: normal Patient leans: N/A  Psychiatric Specialty Exam: ROS  Blood pressure 113/80, pulse (!) 118, height 5\' 9"  (1.753 m), weight 174 lb (78.9 kg), SpO2 99 %.Body mass index is 25.7 kg/m.  General Appearance: Casual  Eye Contact:  Good  Speech:  Clear and Coherent  Volume:  Normal  Mood:  Dysphoric  Affect:  Appropriate  Thought Process:  Goal Directed  Orientation:  NA  Thought Content:  Logical  Suicidal Thoughts:  No  Homicidal Thoughts:  No  Memory:  NA  Judgement:  Good  Insight:  Good  Psychomotor Activity:  Normal  Concentration:    Recall:  [place] of Knowledge:Fair  Language: Good  Akathisia:  No  Handed:  Right  AIMS (if indicated):    Assets:  Desire for Improvement  ADL's:   Intact  Cognition: WNL  Sleep:      Treatment Plan Summary: 6/5/20191:51 PM  At this time the patient will double her desipramine to taking 00 mg. In 2 weeks she'll get a desipramine level. The patient continue taking Xanax 2 mg a day and continue Ambien 12.5 CR at night. She'll continue in talking therapy. Sizer headache she denies any other physical complaints. The patient takes Coumadin and Plavix for her lupus. This patient to return to see me in 7 weeks.

## 2018-05-04 ENCOUNTER — Other Ambulatory Visit (HOSPITAL_COMMUNITY): Payer: Self-pay | Admitting: Psychiatry

## 2018-05-12 ENCOUNTER — Other Ambulatory Visit (HOSPITAL_COMMUNITY): Payer: Self-pay

## 2018-05-12 MED ORDER — DESIPRAMINE HCL 50 MG PO TABS: ORAL_TABLET | ORAL | 1 refills | Status: DC

## 2018-05-13 ENCOUNTER — Ambulatory Visit (INDEPENDENT_AMBULATORY_CARE_PROVIDER_SITE_OTHER): Payer: Medicare Other | Admitting: Licensed Clinical Social Worker

## 2018-05-13 ENCOUNTER — Encounter (HOSPITAL_COMMUNITY): Payer: Self-pay | Admitting: Licensed Clinical Social Worker

## 2018-05-13 DIAGNOSIS — F320 Major depressive disorder, single episode, mild: Secondary | ICD-10-CM

## 2018-05-13 NOTE — Progress Notes (Signed)
   THERAPIST PROGRESS NOTE  Session Time: 3:30pm-4:30pm  Participation Level: Active  Behavioral Response: Well GroomedAlertEuthymic  Type of Therapy: Individual Therapy  Treatment Goals addressed: improve psychiatric symptoms, elevate mood as evidenced by increased social interactions, increased interest, increased self-esteem, and improve unhelpful thought patterns. decrease irrational worries and fears as evidenced by the desire and ability to leave the house, shop with fear, decreased panic, discuss and process past traumas as evidenced by ability to discuss them without feeling overwhelmed. Client will learn about her diagnosis and learn and implement healthy coping skill.   Interventions: CBT and Motivational Interviewing psychoeducation, grounding and mindfulness techniques  Summary: China Deitrick is a 52 y.o. female who presents with Major depressive disorder, recurrent episode, severe, and PTSD, and Mixed Obsessional Thoughts and Acts, and Panic Disorder.   Suicidal/Homicidal: Nowithout intent/plan  Therapist Response: Tashawna met with clinician for an individual session. Fatoumata shared about her psychiatric symptoms, her current life events and her homework. Keven shared that she has been doing much better since medication has been adjusted. She reports she has been more social, spending time outside her house and her room. Sola processed feelings about her daughter's wedding in [place]. She also reported concerns about her father, who has dementia and has recently become aggressive. Clinician did some case management to find a few assisted living facilities in [place] for memory care.   Plan: Return again in 4  weeks.  Diagnosis: Axis I: Major depressive disorder, recurrent episode, severe, and PTSD, and Mixed Obsessional Thoughts and Acts, and Panic Disorder.   Jobe Marker [place], LCSW 05/13/2018

## 2018-05-19 DIAGNOSIS — I10 Essential (primary) hypertension: Secondary | ICD-10-CM | POA: Diagnosis not present

## 2018-05-19 DIAGNOSIS — R002 Palpitations: Secondary | ICD-10-CM | POA: Diagnosis not present

## 2018-05-19 DIAGNOSIS — L932 Other local lupus erythematosus: Secondary | ICD-10-CM | POA: Diagnosis not present

## 2018-06-03 ENCOUNTER — Ambulatory Visit (HOSPITAL_COMMUNITY): Payer: Medicare Other | Admitting: Licensed Clinical Social Worker

## 2018-06-23 ENCOUNTER — Ambulatory Visit (HOSPITAL_COMMUNITY): Payer: Self-pay | Admitting: Psychiatry

## 2018-06-24 ENCOUNTER — Ambulatory Visit (HOSPITAL_COMMUNITY): Payer: Medicare Other | Admitting: Licensed Clinical Social Worker

## 2018-06-29 IMAGING — CT CT HEAD W/O CM
3 of 4 series · 16 of 47 positions shown, 19 images · non-contrast
Comparison: None.

CLINICAL DATA: Dizziness, nausea, and vomiting this evening.

EXAM:
CT HEAD WITHOUT CONTRAST
TECHNIQUE: Contiguous axial images were obtained from the base of the skull
through the vertex without intravenous contrast.

[Series 3: head 2.0 h70h · axial · 0.41mm/px · z∈[-162,-18]mm · 10 of 86 slices shown, 13 images]
[im 9/86  brain]
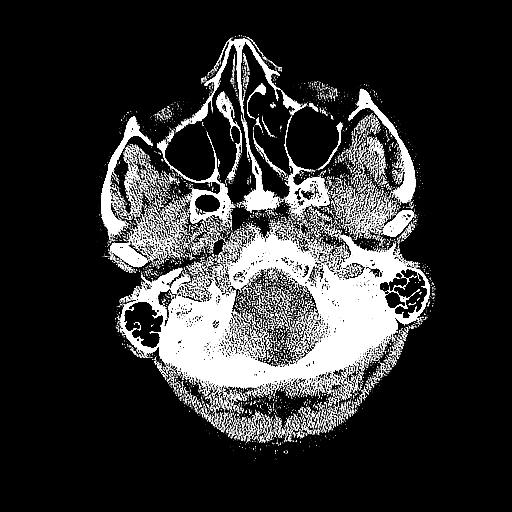
[im 9/86  bone]
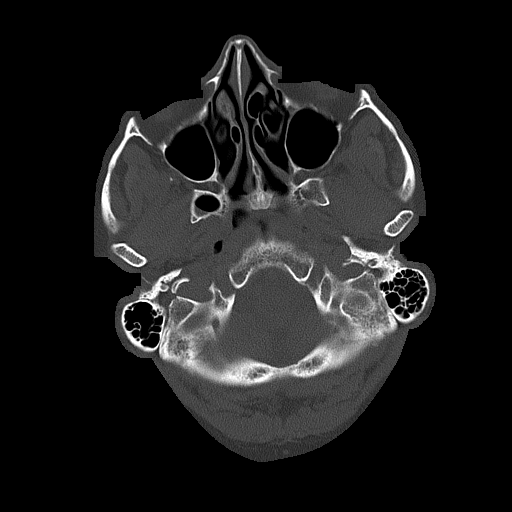
[im 17/86  brain]
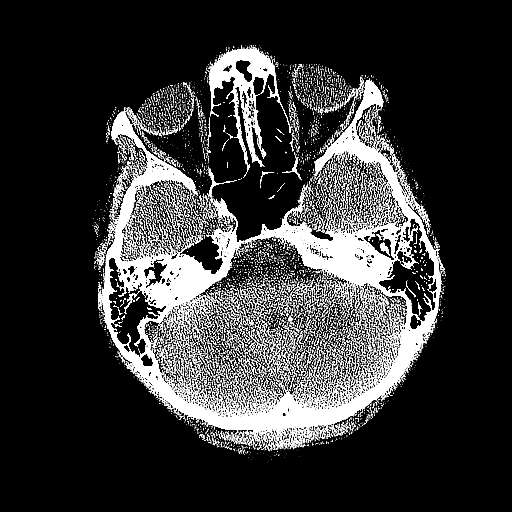
[im 25/86  brain]
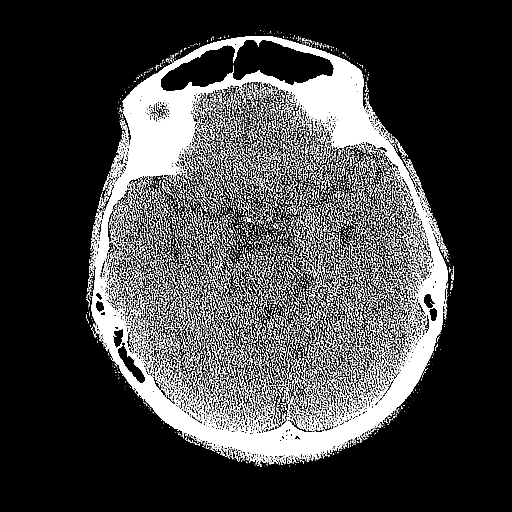
[im 33/86  brain]
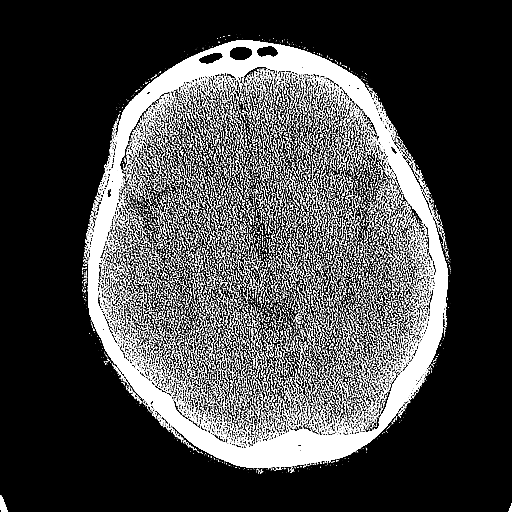
[im 41/86  brain]
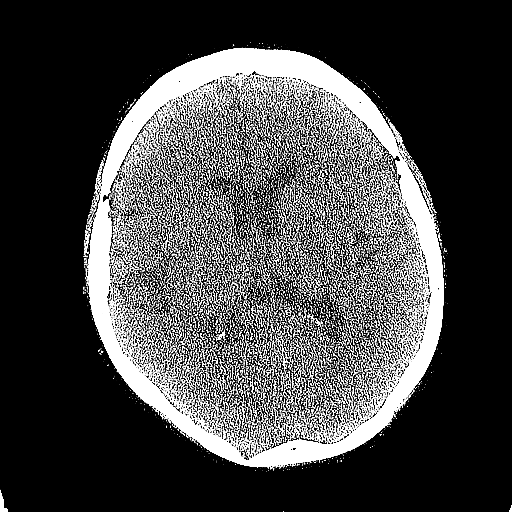
[im 41/86  bone]
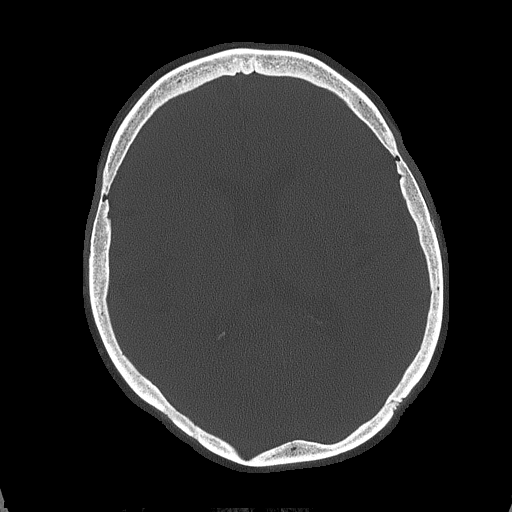
[im 49/86  brain]
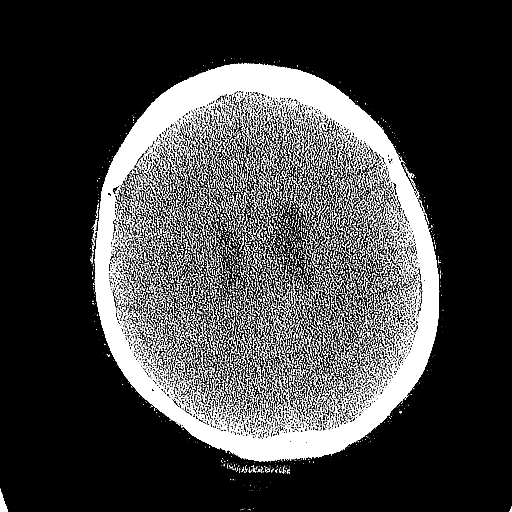
[im 57/86  brain]
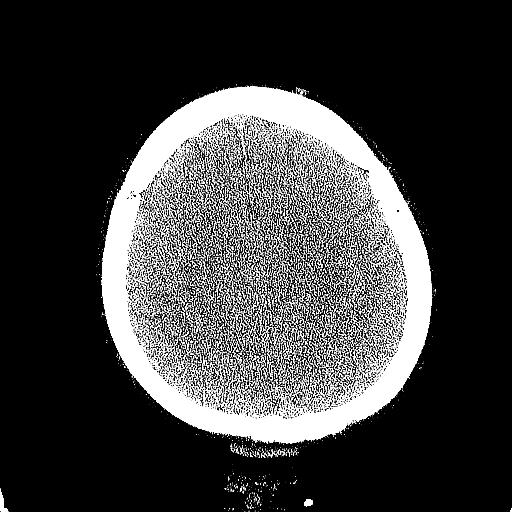
[im 65/86  brain]
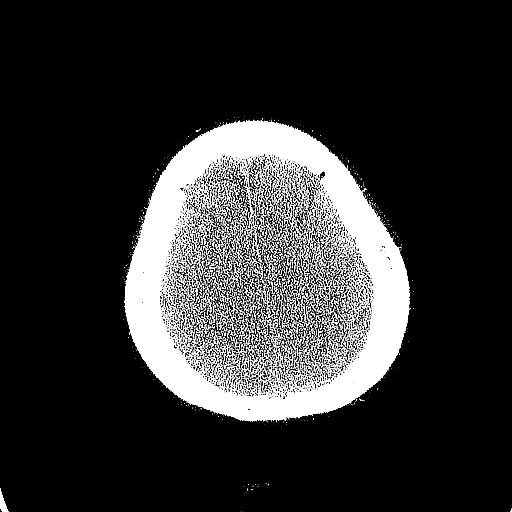
[im 73/86  brain]
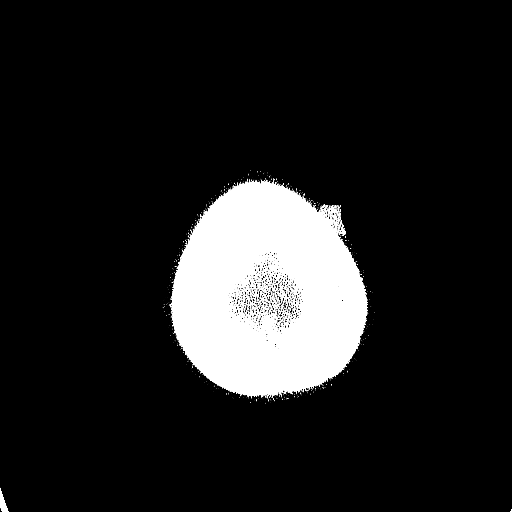
[im 73/86  bone]
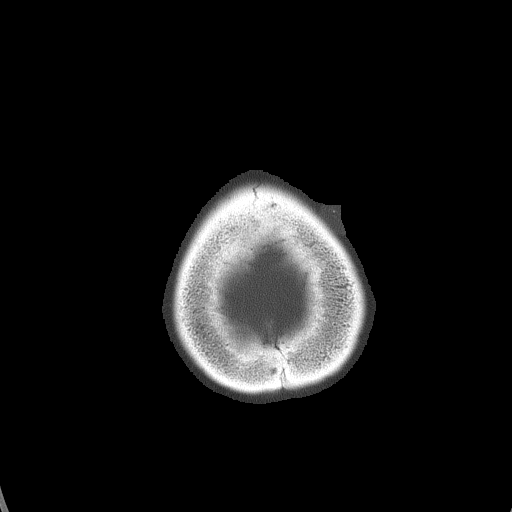
[im 81/86  brain]
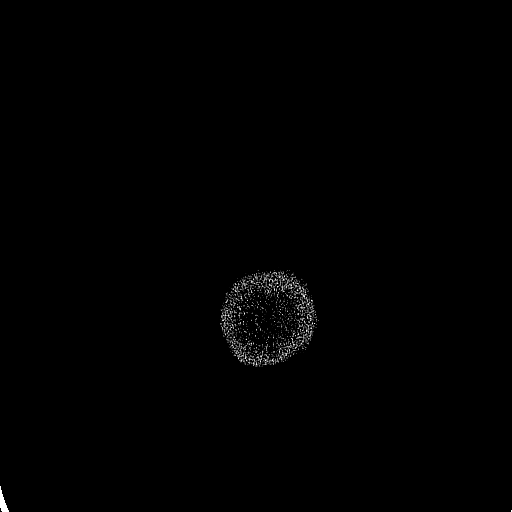

[Series 4: head 3.0 mpr · coronal · 0.32mm/px · 3 of 61 slices shown (1 of 2)]
[im 21/61  brain]
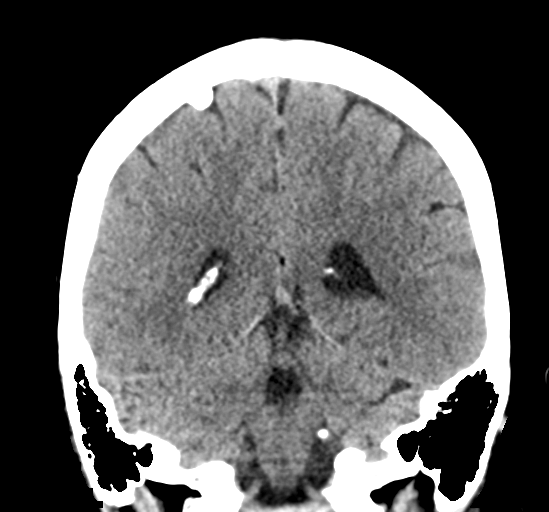
[im 27/61  brain]
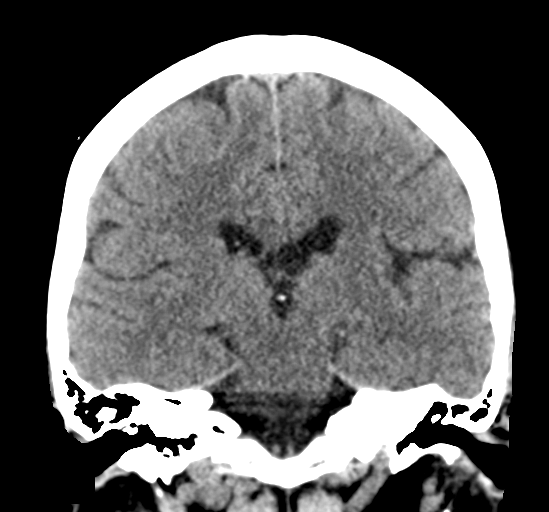
[im 34/61  brain]
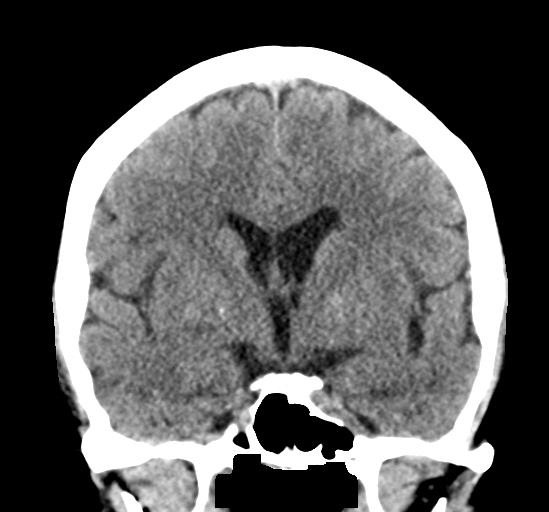

[Series 5: head 3.0 mpr · sagittal · 0.33mm/px · 3 of 55 slices shown (2 of 2)]
[im 19/55  brain]
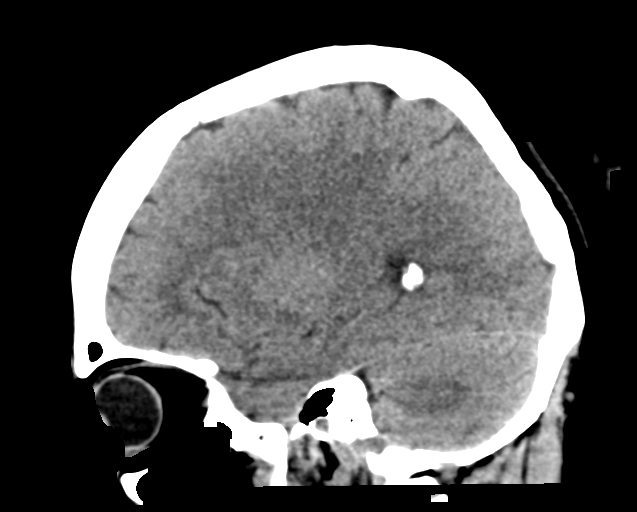
[im 28/55  brain]
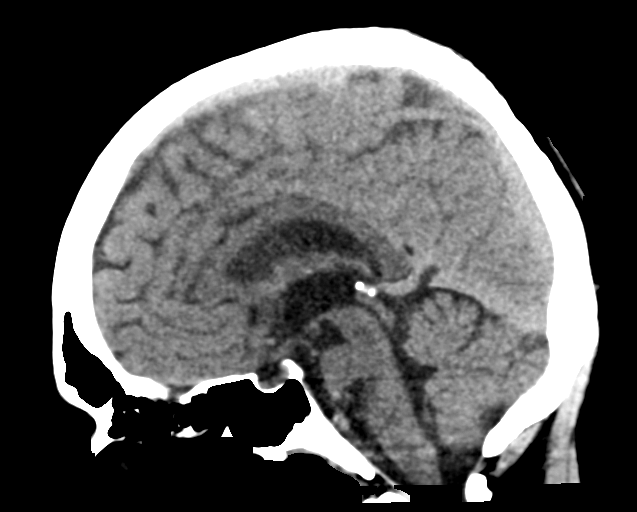
[im 37/55  brain]
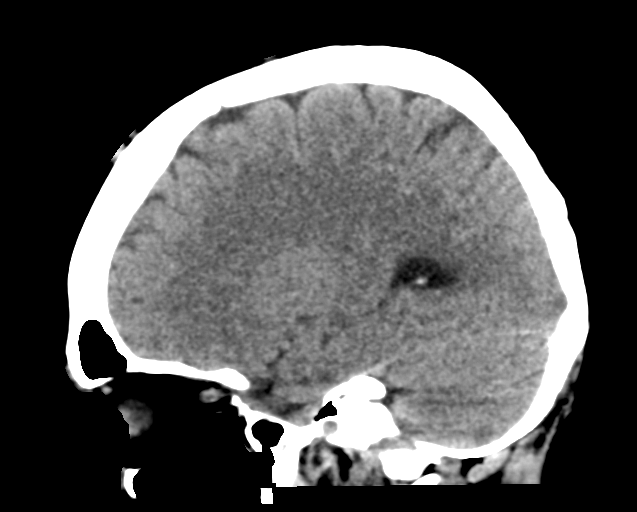

[16 of 47 positions shown; findings below may reference images not displayed]

FINDINGS: Brain: No evidence of acute infarction, hemorrhage, hydrocephalus,
extra-axial collection or mass lesion/mass effect.

Vascular: No hyperdense vessel or unexpected calcification.

Skull: Normal. Negative for fracture or focal lesion.

Sinuses/Orbits: No acute finding.

Other: Subcutaneous soft tissue scalp nodules with calcification
likely representing trichilemmal cysts.
IMPRESSION: No acute intracranial abnormalities.

## 2018-07-13 ENCOUNTER — Other Ambulatory Visit (HOSPITAL_COMMUNITY): Payer: Self-pay | Admitting: Psychiatry

## 2018-07-27 DIAGNOSIS — E876 Hypokalemia: Secondary | ICD-10-CM | POA: Diagnosis not present

## 2018-07-27 DIAGNOSIS — I10 Essential (primary) hypertension: Secondary | ICD-10-CM | POA: Diagnosis not present

## 2018-07-27 DIAGNOSIS — G4700 Insomnia, unspecified: Secondary | ICD-10-CM | POA: Diagnosis not present

## 2018-07-27 DIAGNOSIS — Z23 Encounter for immunization: Secondary | ICD-10-CM | POA: Diagnosis not present

## 2018-07-27 DIAGNOSIS — K5900 Constipation, unspecified: Secondary | ICD-10-CM | POA: Diagnosis not present

## 2018-07-27 DIAGNOSIS — K219 Gastro-esophageal reflux disease without esophagitis: Secondary | ICD-10-CM | POA: Diagnosis not present

## 2018-07-27 DIAGNOSIS — Z7901 Long term (current) use of anticoagulants: Secondary | ICD-10-CM | POA: Diagnosis not present

## 2018-07-27 DIAGNOSIS — Z86711 Personal history of pulmonary embolism: Secondary | ICD-10-CM | POA: Diagnosis not present

## 2018-07-27 DIAGNOSIS — M797 Fibromyalgia: Secondary | ICD-10-CM | POA: Diagnosis not present

## 2018-07-27 DIAGNOSIS — E7800 Pure hypercholesterolemia, unspecified: Secondary | ICD-10-CM | POA: Diagnosis not present

## 2018-07-27 DIAGNOSIS — M329 Systemic lupus erythematosus, unspecified: Secondary | ICD-10-CM | POA: Diagnosis not present

## 2018-07-27 DIAGNOSIS — G43909 Migraine, unspecified, not intractable, without status migrainosus: Secondary | ICD-10-CM | POA: Diagnosis not present

## 2018-07-27 DIAGNOSIS — Z8673 Personal history of transient ischemic attack (TIA), and cerebral infarction without residual deficits: Secondary | ICD-10-CM | POA: Diagnosis not present

## 2018-07-30 ENCOUNTER — Telehealth (HOSPITAL_COMMUNITY): Payer: Self-pay

## 2018-07-30 NOTE — Telephone Encounter (Signed)
Patient needs a refill on Ambien 12.5mg  tablets. Next appointment is 08-18-18.

## 2018-08-03 ENCOUNTER — Ambulatory Visit (HOSPITAL_COMMUNITY): Payer: Medicare Other | Admitting: Licensed Clinical Social Worker

## 2018-08-03 MED ORDER — ZOLPIDEM TARTRATE ER 12.5 MG PO TBCR: 12.5000 mg | EXTENDED_RELEASE_TABLET | Freq: Every evening | ORAL | 5 refills | Status: DC | PRN

## 2018-08-16 DIAGNOSIS — Z7901 Long term (current) use of anticoagulants: Secondary | ICD-10-CM | POA: Diagnosis not present

## 2018-08-16 DIAGNOSIS — Z86711 Personal history of pulmonary embolism: Secondary | ICD-10-CM | POA: Diagnosis not present

## 2018-08-18 ENCOUNTER — Ambulatory Visit (INDEPENDENT_AMBULATORY_CARE_PROVIDER_SITE_OTHER): Payer: Medicare Other | Admitting: Psychiatry

## 2018-08-18 ENCOUNTER — Encounter (HOSPITAL_COMMUNITY): Payer: Self-pay | Admitting: Psychiatry

## 2018-08-18 VITALS — BP 128/74 | Ht 68.0 in | Wt 179.0 lb

## 2018-08-18 DIAGNOSIS — F325 Major depressive disorder, single episode, in full remission: Secondary | ICD-10-CM

## 2018-08-18 MED ORDER — ALPRAZOLAM 2 MG PO TABS: 2.0000 mg | ORAL_TABLET | Freq: Every evening | ORAL | 2 refills | Status: DC | PRN

## 2018-08-18 MED ORDER — DESIPRAMINE HCL 50 MG PO TABS: ORAL_TABLET | ORAL | 2 refills | Status: DC

## 2018-08-18 MED ORDER — ZOLPIDEM TARTRATE ER 12.5 MG PO TBCR: 12.5000 mg | EXTENDED_RELEASE_TABLET | Freq: Every evening | ORAL | 5 refills | Status: DC | PRN

## 2018-08-18 NOTE — Progress Notes (Signed)
Psychiatric Initial Adult Assessment   Patient Identification: Lauren Chung MRN:  [id number] Date of Evaluation:  08/18/2018 Referral Source [place] Officer Chief Complaint:    Visit Diagnosis:  No diagnosis found. Patient is doing very well.  Her mood is significantly changed.  The doubling of the desipramine has helped her a great deal.  Patient now is enjoying much more things.  She went to [facility] and once a morning and went with her whole family.  The patient is sleeping and eating well.  She is got good energy.  Earlier this year however she was hospitalized and apparently was septic.  She had significant kidney problems all related to her lupus.  At this time the patient has good energy's is positive and optimistic.  She denies psychotic symptoms.  She never drinks alcohol never used drugs.  The patient has minor joint pains at this time.  She continues taking Coumadin and other medications for her lupus.  The patient takes a significant dose of Xanax at night.  She also is on Ambien.  sabotages her care. (Hypo) Manic Symptoms:   Anxiety Symptoms:   Psychotic Symptoms:   PTSD Symptoms:   Past Psychiatric History: Past therapy, one psychiatric hospitalization  Previous Psychotropic Medications: Yes   Substance Abuse History in the last 12 months:  No.  Consequences of Substance Abuse: Negative  Past Medical History:  Past Medical History:  Diagnosis Date  . Anxiety   . Depression   . Lupus ([place])   . PE (pulmonary embolism)   . PTSD (post-traumatic stress disorder)   . Stroke ([place])   . TIA (transient ischemic attack)     Past Surgical History:  Procedure Laterality Date  . ABDOMINAL SURGERY    . ABLATION      Family Psychiatric History:   Family History:  Family History  Problem Relation Age of Onset  . Leukemia Father   . Seizures Daughter   . Drug abuse Mother   . Alcohol abuse Mother     Social History:   Social History   Socioeconomic History  .  Marital status: Legally Separated    Spouse name: Not on file  . Number of children: Not on file  . Years of education: Not on file  . Highest education level: Not on file  Occupational History  . Not on file  Social Needs  . Financial resource strain: Not on file  . Food insecurity:    Worry: Not on file    Inability: Not on file  . Transportation needs:    Medical: Not on file    Non-medical: Not on file  Tobacco Use  . Smoking status: Never Smoker  . Smokeless tobacco: Never Used  Substance and Sexual Activity  . Alcohol use: No  . Drug use: No  . Sexual activity: Yes    Partners: Male    Birth control/protection: Condom  Lifestyle  . Physical activity:    Days per week: Not on file    Minutes per session: Not on file  . Stress: Not on file  Relationships  . Social connections:    Talks on phone: Not on file    Gets together: Not on file    Attends religious service: Not on file    Active member of club or organization: Not on file    Attends meetings of clubs or organizations: Not on file    Relationship status: Not on file  Other Topics Concern  . Not on file  Social History Narrative  . Not on file    Additional Social History:   Allergies:   Allergies  Allergen Reactions  . Codeine Shortness Of Breath  . Contrast Media [Iodinated Diagnostic Agents] Shortness Of Breath  . Tylenol [Acetaminophen] Shortness Of Breath    Metabolic Disorder Labs: No results found for: HGBA1C, MPG No results found for: PROLACTIN No results found for: CHOL, TRIG, HDL, CHOLHDL, VLDL, LDLCALC   Current Medications: Current Outpatient Medications  Medication Sig Dispense Refill  . alprazolam (XANAX) 2 MG tablet Take 1 tablet (2 mg total) by mouth at bedtime as needed for sleep. 30 tablet 2  . desipramine (NORPRAMIN) 50 MG tablet 2  qam 180 tablet 2  . magnesium oxide (MAG-OX) 400 (241.3 Mg) MG tablet Take 0.5 tablets (200 mg total) by mouth 2 (two) times daily. 10 tablet 0   . senna-docusate (SENOKOT-S) 8.6-50 MG tablet Take 1 tablet by mouth at bedtime as needed for mild constipation. 10 tablet 0  . warfarin (COUMADIN) 5 MG tablet Take 1 tablet (5 mg total) by mouth daily at 6 PM. Adjust dose based on INR with primary care physician in 5 days (Patient taking differently: Take 2.5-5 mg by mouth daily. Take 5mg  by mouth on Monday, Wednesday, and Friday. Take 2.5mg  by mouth on Tuesday, Thursday, Saturday, and Sunday.) 30 tablet 0  . zolpidem (AMBIEN CR) 12.5 MG CR tablet Take 1 tablet (12.5 mg total) by mouth at bedtime as needed for sleep. 30 tablet 5   No current facility-administered medications for this visit.     Neurologic: Headache: No Seizure: No Paresthesias:No  Musculoskeletal: Strength & Muscle Tone: within normal limits Gait & Station: normal Patient leans: N/A  Psychiatric Specialty Exam: ROS  Blood pressure 128/74, height 5\' 8"  (1.727 m), weight 179 lb (81.2 kg).Body mass index is 27.22 kg/m.  General Appearance: Casual  Eye Contact:  Good  Speech:  Clear and Coherent  Volume:  Normal  Mood:  Dysphoric  Affect:  Appropriate  Thought Process:  Goal Directed  Orientation:  NA  Thought Content:  Logical  Suicidal Thoughts:  No  Homicidal Thoughts:  No  Memory:  NA  Judgement:  Good  Insight:  Good  Psychomotor Activity:  Normal  Concentration:    Recall:  [place] of Knowledge:Fair  Language: Good  Akathisia:  No  Handed:  Right  AIMS (if indicated):    Assets:  Desire for Improvement  ADL's:  Intact  Cognition: WNL  Sleep:      Treatment Plan Summary: 10/16/20193:41 PM  At this time the patient's major problem is that of major clinical depression.  She is significantly improved with the use of higher dose of desipramine.  The patient is few vegetative symptoms at this time.  Her second problem is associated to the anxiety that comes with her depression and her lupus.  She is getting good medical care and she feels like her  lupus is better in control.  She takes a fixed dose of Xanax.  She denies the use of alcohol or drugs.  She denies significant problems in terms of chest pain or shortness of breath.  She denies any neurological symptoms at this time.  Patient is asleep are pretty much resolved by taking Ambien together with 2 mg of Xanax.  At this time the patient is doing very well.  She will return to see me in 3 months.

## 2018-08-24 DIAGNOSIS — Z86711 Personal history of pulmonary embolism: Secondary | ICD-10-CM | POA: Diagnosis not present

## 2018-08-24 DIAGNOSIS — Z7901 Long term (current) use of anticoagulants: Secondary | ICD-10-CM | POA: Diagnosis not present

## 2018-09-01 ENCOUNTER — Other Ambulatory Visit: Payer: Self-pay

## 2018-09-01 ENCOUNTER — Encounter (HOSPITAL_COMMUNITY): Payer: Self-pay | Admitting: Emergency Medicine

## 2018-09-01 ENCOUNTER — Observation Stay (HOSPITAL_COMMUNITY): Payer: Medicare Other

## 2018-09-01 ENCOUNTER — Emergency Department (HOSPITAL_COMMUNITY): Payer: Medicare Other

## 2018-09-01 ENCOUNTER — Inpatient Hospital Stay (HOSPITAL_COMMUNITY)
Admission: EM | Admit: 2018-09-01 | Discharge: 2018-09-05 | DRG: 872 | Disposition: A | Discharge status: Home / Self Care | Payer: Medicare Other | Attending: Family Medicine | Admitting: Family Medicine

## 2018-09-01 DIAGNOSIS — B9620 Unspecified Escherichia coli [E. coli] as the cause of diseases classified elsewhere: Secondary | ICD-10-CM | POA: Diagnosis not present

## 2018-09-01 DIAGNOSIS — N10 Acute tubulo-interstitial nephritis: Secondary | ICD-10-CM | POA: Diagnosis not present

## 2018-09-01 DIAGNOSIS — Z8673 Personal history of transient ischemic attack (TIA), and cerebral infarction without residual deficits: Secondary | ICD-10-CM

## 2018-09-01 DIAGNOSIS — Z91041 Radiographic dye allergy status: Secondary | ICD-10-CM

## 2018-09-01 DIAGNOSIS — M329 Systemic lupus erythematosus, unspecified: Secondary | ICD-10-CM | POA: Diagnosis present

## 2018-09-01 DIAGNOSIS — D649 Anemia, unspecified: Secondary | ICD-10-CM

## 2018-09-01 DIAGNOSIS — A419 Sepsis, unspecified organism: Secondary | ICD-10-CM | POA: Diagnosis not present

## 2018-09-01 DIAGNOSIS — Z7901 Long term (current) use of anticoagulants: Secondary | ICD-10-CM

## 2018-09-01 DIAGNOSIS — N179 Acute kidney failure, unspecified: Secondary | ICD-10-CM | POA: Diagnosis present

## 2018-09-01 DIAGNOSIS — I10 Essential (primary) hypertension: Secondary | ICD-10-CM | POA: Diagnosis present

## 2018-09-01 DIAGNOSIS — G4700 Insomnia, unspecified: Secondary | ICD-10-CM | POA: Diagnosis present

## 2018-09-01 DIAGNOSIS — Z888 Allergy status to other drugs, medicaments and biological substances status: Secondary | ICD-10-CM

## 2018-09-01 DIAGNOSIS — F332 Major depressive disorder, recurrent severe without psychotic features: Secondary | ICD-10-CM | POA: Diagnosis not present

## 2018-09-01 DIAGNOSIS — F4310 Post-traumatic stress disorder, unspecified: Secondary | ICD-10-CM | POA: Diagnosis present

## 2018-09-01 DIAGNOSIS — R05 Cough: Secondary | ICD-10-CM | POA: Diagnosis not present

## 2018-09-01 DIAGNOSIS — R509 Fever, unspecified: Secondary | ICD-10-CM | POA: Diagnosis present

## 2018-09-01 DIAGNOSIS — R1013 Epigastric pain: Secondary | ICD-10-CM | POA: Diagnosis not present

## 2018-09-01 DIAGNOSIS — E876 Hypokalemia: Secondary | ICD-10-CM | POA: Diagnosis not present

## 2018-09-01 DIAGNOSIS — R0600 Dyspnea, unspecified: Secondary | ICD-10-CM

## 2018-09-01 DIAGNOSIS — Z885 Allergy status to narcotic agent status: Secondary | ICD-10-CM

## 2018-09-01 DIAGNOSIS — R1012 Left upper quadrant pain: Secondary | ICD-10-CM | POA: Diagnosis not present

## 2018-09-01 DIAGNOSIS — R1010 Upper abdominal pain, unspecified: Secondary | ICD-10-CM

## 2018-09-01 DIAGNOSIS — R000 Tachycardia, unspecified: Secondary | ICD-10-CM | POA: Diagnosis not present

## 2018-09-01 DIAGNOSIS — Z806 Family history of leukemia: Secondary | ICD-10-CM

## 2018-09-01 DIAGNOSIS — Z79899 Other long term (current) drug therapy: Secondary | ICD-10-CM

## 2018-09-01 DIAGNOSIS — K7689 Other specified diseases of liver: Secondary | ICD-10-CM | POA: Diagnosis not present

## 2018-09-01 DIAGNOSIS — R1011 Right upper quadrant pain: Secondary | ICD-10-CM | POA: Diagnosis not present

## 2018-09-01 DIAGNOSIS — R109 Unspecified abdominal pain: Secondary | ICD-10-CM | POA: Diagnosis not present

## 2018-09-01 DIAGNOSIS — I6930 Unspecified sequelae of cerebral infarction: Secondary | ICD-10-CM

## 2018-09-01 DIAGNOSIS — Z86711 Personal history of pulmonary embolism: Secondary | ICD-10-CM

## 2018-09-01 LAB — CBC WITH DIFFERENTIAL/PLATELET
Abs Immature Granulocytes: 0.08 10*3/uL — ABNORMAL HIGH (ref 0.00–0.07)
BASOS PCT: 0 %
Basophils Absolute: 0 10*3/uL (ref 0.0–0.1)
EOS PCT: 0 %
Eosinophils Absolute: 0 10*3/uL (ref 0.0–0.5)
HCT: 37.8 % (ref 36.0–46.0)
HEMOGLOBIN: 11.4 g/dL — AB (ref 12.0–15.0)
Immature Granulocytes: 1 %
LYMPHS PCT: 7 %
Lymphs Abs: 1 10*3/uL (ref 0.7–4.0)
MCH: 24.4 pg — AB (ref 26.0–34.0)
MCHC: 30.2 g/dL (ref 30.0–36.0)
MCV: 80.8 fL (ref 80.0–100.0)
MONO ABS: 0.8 10*3/uL (ref 0.1–1.0)
MONOS PCT: 6 %
Neutro Abs: 11.3 10*3/uL — ABNORMAL HIGH (ref 1.7–7.7)
Neutrophils Relative %: 86 %
Platelets: 346 10*3/uL (ref 150–400)
RBC: 4.68 MIL/uL (ref 3.87–5.11)
RDW: 18 % — ABNORMAL HIGH (ref 11.5–15.5)
WBC: 13.2 10*3/uL — AB (ref 4.0–10.5)
nRBC: 0 % (ref 0.0–0.2)

## 2018-09-01 LAB — COMPREHENSIVE METABOLIC PANEL
ALT: 26 U/L (ref 0–44)
AST: 36 U/L (ref 15–41)
Albumin: 3.6 g/dL (ref 3.5–5.0)
Alkaline Phosphatase: 111 U/L (ref 38–126)
Anion gap: 13 (ref 5–15)
BILIRUBIN TOTAL: 1.1 mg/dL (ref 0.3–1.2)
BUN: 12 mg/dL (ref 6–20)
CO2: 25 mmol/L (ref 22–32)
CREATININE: 1.42 mg/dL — AB (ref 0.44–1.00)
Calcium: 8.3 mg/dL — ABNORMAL LOW (ref 8.9–10.3)
Chloride: 91 mmol/L — ABNORMAL LOW (ref 98–111)
GFR calc Af Amer: 49 mL/min — ABNORMAL LOW (ref >60)
GFR, EST NON AFRICAN AMERICAN: 42 mL/min — AB (ref >60)
Glucose, Bld: 111 mg/dL — ABNORMAL HIGH (ref 70–99)
Potassium: 2.8 mmol/L — ABNORMAL LOW (ref 3.5–5.1)
Sodium: 129 mmol/L — ABNORMAL LOW (ref 135–145)
TOTAL PROTEIN: 8.1 g/dL (ref 6.5–8.1)

## 2018-09-01 LAB — URINALYSIS, ROUTINE W REFLEX MICROSCOPIC
Bilirubin Urine: NEGATIVE
Glucose, UA: NEGATIVE mg/dL
Ketones, ur: NEGATIVE mg/dL
NITRITE: NEGATIVE
Protein, ur: 30 mg/dL — AB
SPECIFIC GRAVITY, URINE: 1.014 (ref 1.005–1.030)
pH: 6 (ref 5.0–8.0)

## 2018-09-01 LAB — CG4 I-STAT (LACTIC ACID)
LACTIC ACID, VENOUS: 1.96 mmol/L — AB (ref 0.5–1.9)
LACTIC ACID, VENOUS: 1.87 mmol/L (ref 0.5–1.9)

## 2018-09-01 LAB — I-STAT BETA HCG BLOOD, ED (NOT ORDERABLE): HCG, QUANTITATIVE: 10.8 m[IU]/mL — AB (ref <5)

## 2018-09-01 LAB — PROTIME-INR
INR: 1.7
PROTHROMBIN TIME: 19.8 s — AB (ref 11.4–15.2)

## 2018-09-01 LAB — MAGNESIUM: MAGNESIUM: 1.6 mg/dL — AB (ref 1.7–2.4)

## 2018-09-01 LAB — LIPASE, BLOOD: LIPASE: 22 U/L (ref 11–51)

## 2018-09-01 LAB — POCT PREGNANCY, URINE: Preg Test, Ur: NEGATIVE

## 2018-09-01 MED ORDER — ALPRAZOLAM 1 MG PO TABS
2.0000 mg | ORAL_TABLET | Freq: Every evening | ORAL | Status: DC | PRN
  Administered 2018-09-01: 2 mg via ORAL
  Filled 2018-09-01: qty 2

## 2018-09-01 MED ORDER — SODIUM CHLORIDE 0.9 % IV SOLN
INTRAVENOUS | Status: DC
  Administered 2018-09-01 – 2018-09-02 (×2): via INTRAVENOUS
  Administered 2018-09-04: 10 mL/h via INTRAVENOUS

## 2018-09-01 MED ORDER — ONDANSETRON HCL 4 MG PO TABS: 4.0000 mg | ORAL_TABLET | Freq: Four times a day (QID) | ORAL | Status: DC | PRN

## 2018-09-01 MED ORDER — SODIUM CHLORIDE 0.9 % IV SOLN
1.0000 g | INTRAVENOUS | Status: DC
  Filled 2018-09-01: qty 10

## 2018-09-01 MED ORDER — LACTATED RINGERS IV BOLUS
1000.0000 mL | Freq: Once | INTRAVENOUS | Status: AC
  Administered 2018-09-01: 1000 mL via INTRAVENOUS

## 2018-09-01 MED ORDER — ONDANSETRON HCL 4 MG/2ML IJ SOLN: 4.0000 mg | Freq: Four times a day (QID) | INTRAMUSCULAR | Status: DC | PRN

## 2018-09-01 MED ORDER — POTASSIUM CHLORIDE 10 MEQ/100ML IV SOLN
10.0000 meq | INTRAVENOUS | Status: AC
  Administered 2018-09-01 – 2018-09-02 (×4): 10 meq via INTRAVENOUS
  Filled 2018-09-01 (×4): qty 100

## 2018-09-01 MED ORDER — ZOLPIDEM TARTRATE 5 MG PO TABS
5.0000 mg | ORAL_TABLET | Freq: Every evening | ORAL | Status: DC | PRN
  Administered 2018-09-01: 5 mg via ORAL
  Filled 2018-09-01 (×2): qty 1

## 2018-09-01 MED ORDER — DESIPRAMINE HCL 50 MG PO TABS
100.0000 mg | ORAL_TABLET | Freq: Every day | ORAL | Status: DC
  Administered 2018-09-02 – 2018-09-05 (×4): 100 mg via ORAL
  Filled 2018-09-01 (×4): qty 2

## 2018-09-01 MED ORDER — WARFARIN - PHARMACIST DOSING INPATIENT: Freq: Every day | Status: DC

## 2018-09-01 MED ORDER — IBUPROFEN 200 MG PO TABS
600.0000 mg | ORAL_TABLET | Freq: Once | ORAL | Status: AC
  Administered 2018-09-01: 600 mg via ORAL
  Filled 2018-09-01: qty 3

## 2018-09-01 MED ORDER — SODIUM CHLORIDE 0.9 % IV BOLUS: 1000.0000 mL | Freq: Once | INTRAVENOUS | Status: DC

## 2018-09-01 MED ORDER — FENTANYL CITRATE (PF) 100 MCG/2ML IJ SOLN
50.0000 ug | INTRAMUSCULAR | Status: DC | PRN
  Administered 2018-09-02 (×2): 50 ug via INTRAVENOUS
  Filled 2018-09-01 (×2): qty 2

## 2018-09-01 MED ORDER — POTASSIUM CHLORIDE CRYS ER 20 MEQ PO TBCR
40.0000 meq | EXTENDED_RELEASE_TABLET | Freq: Once | ORAL | Status: AC
  Administered 2018-09-01: 40 meq via ORAL
  Filled 2018-09-01: qty 2

## 2018-09-01 MED ORDER — SODIUM CHLORIDE 0.9 % IV SOLN
1.0000 g | Freq: Once | INTRAVENOUS | Status: AC
  Administered 2018-09-01: 1 g via INTRAVENOUS
  Filled 2018-09-01: qty 10

## 2018-09-01 MED ORDER — WARFARIN SODIUM 3 MG PO TABS
3.0000 mg | ORAL_TABLET | Freq: Once | ORAL | Status: AC
  Administered 2018-09-01: 3 mg via ORAL
  Filled 2018-09-01: qty 1

## 2018-09-01 MED ORDER — FENTANYL CITRATE (PF) 100 MCG/2ML IJ SOLN
100.0000 ug | Freq: Once | INTRAMUSCULAR | Status: AC
  Administered 2018-09-01: 100 ug via INTRAVENOUS
  Filled 2018-09-01: qty 2

## 2018-09-01 NOTE — ED Notes (Signed)
Pt states that she is allergic to Tylenol and can't take Ibuprofen due to her increased INR levels when asked about taking medications for fever.

## 2018-09-01 NOTE — ED Provider Notes (Signed)
[facility] DEPT Provider Note   CSN: [id number] Arrival date & time: 09/01/18  1304     History   Chief Complaint Chief Complaint  Patient presents with  . Insomnia  . Generalized Body Aches  . Fatigue    HPI Lauren Chung is a 52 y.o. female.  HPI  52 year old female presents with body aches and abdominal/flank pain.  Patient states that she is been feeling poorly for about for 5 days.  Seem to start off with insomnia.  She takes Ambien but still is unable to sleep.  She is also been noticing diffuse body aches from her ankles to her head.  No joint swelling.  Typical of lupus flares for her.  She then has noticed upper abdominal pain and bilateral back pain.  This feels similar to when she was here in January.  At that point she had sepsis with pyelonephritis.  Patient states she has not had dysuria but has been having foul-smelling urine.  No hematuria.  She is been having fever up to 104 for about 3 days.  She had a cough last week but that resolved and she has no current cough, chest pain, shortness of breath.  No diarrhea.  The patient has not been able to take any medicines for the pain because she is allergic to Tylenol and has been told not to take ibuprofen because a couple days ago her INR was 6.  At that time, they changed her Coumadin from 5 mg to 2 mg.  Past Medical History:  Diagnosis Date  . Anxiety   . Depression   . Lupus ([place])   . PE (pulmonary embolism)   . PTSD (post-traumatic stress disorder)   . Stroke ([place])   . TIA (transient ischemic attack)     Patient Active Problem List   Diagnosis Date Noted  . Pyelonephritis 11/16/2017  . Sepsis ([place]) 11/16/2017  . Hypokalemia 11/16/2017  . HTN (hypertension) 11/16/2017  . Stroke ([place])   . Pulmonary embolism ([place])   . Lupus ([place])   . Depression with anxiety   . AKI (acute kidney injury) ([place])   . Severe recurrent major depression without psychotic features ([place]) 09/30/2016      Class: Chronic    Past Surgical History:  Procedure Laterality Date  . ABDOMINAL SURGERY    . ABLATION       OB History   None      Home Medications    Prior to Admission medications   Medication Sig Start Date End Date Taking? Authorizing Provider  alprazolam Duanne Moron) 2 MG tablet Take 1 tablet (2 mg total) by mouth at bedtime as needed for sleep. 08/18/18  Yes Plovsky, Berneta Sages, MD  desipramine (NORPRAMIN) 50 MG tablet 2  qam Patient taking differently: Take 100 mg by mouth daily. 2  qam 08/18/18  Yes Plovsky, Berneta Sages, MD  linaclotide [facility]) 72 MCG capsule Take 72 mcg by mouth daily before breakfast.   Yes [provider]  warfarin (COUMADIN) 2 MG tablet Take 2 mg by mouth daily. 08/16/18  Yes [provider]  zolpidem (AMBIEN CR) 12.5 MG CR tablet Take 1 tablet (12.5 mg total) by mouth at bedtime as needed for sleep. 08/18/18  Yes Plovsky, Berneta Sages, MD  magnesium oxide (MAG-OX) 400 (241.3 Mg) MG tablet Take 0.5 tablets (200 mg total) by mouth 2 (two) times daily. Patient not taking: Reported on 09/01/2018 11/20/17   Regalado, Jerald Kief A, MD  senna-docusate (SENOKOT-S) 8.6-50 MG tablet Take 1  tablet by mouth at bedtime as needed for mild constipation. Patient not taking: Reported on 09/01/2018 11/20/17   Niel Hummer A, MD  warfarin (COUMADIN) 5 MG tablet Take 1 tablet (5 mg total) by mouth daily at 6 PM. Adjust dose based on INR with primary care physician in 5 days Patient not taking: Reported on 09/01/2018 08/11/16   Brunetta Genera, MD    Family History Family History  Problem Relation Age of Onset  . Leukemia Father   . Seizures Daughter   . Drug abuse Mother   . Alcohol abuse Mother     Social History Social History   Tobacco Use  . Smoking status: Never Smoker  . Smokeless tobacco: Never Used  Substance Use Topics  . Alcohol use: No  . Drug use: No     Allergies   Codeine; Contrast media [iodinated diagnostic agents]; and Tylenol  [acetaminophen]   Review of Systems Review of Systems  Constitutional: Positive for fever.  Respiratory: Negative for cough and shortness of breath.   Cardiovascular: Negative for chest pain.  Gastrointestinal: Positive for abdominal pain and nausea. Negative for diarrhea and vomiting.  Genitourinary: Negative for dysuria, hematuria and menstrual problem.  Musculoskeletal: Positive for arthralgias and back pain. Negative for joint swelling.  All other systems reviewed and are negative.    Physical Exam Updated Vital Signs BP (!) 132/108 (BP Location: Left Arm)   Pulse (!) 130   Temp (!) 102.9 F (39.4 C) (Oral)   Resp 18   Ht 5\' 8"  (1.727 m)   SpO2 100%   BMI 27.22 kg/m   Physical Exam  Constitutional: She appears well-developed and well-nourished. No distress.  HENT:  Head: Normocephalic and atraumatic.  Right Ear: External ear normal.  Left Ear: External ear normal.  Nose: Nose normal.  Eyes: Right eye exhibits no discharge. Left eye exhibits no discharge.  Cardiovascular: Regular rhythm and normal heart sounds. Tachycardia present.  Pulmonary/Chest: Effort normal and breath sounds normal. She has no wheezes.  Abdominal: Soft. There is tenderness in the right upper quadrant, epigastric area and left upper quadrant. There is CVA tenderness (bilateral).  Neurological: She is alert.  Skin: Skin is warm and dry. She is not diaphoretic.  Psychiatric: Her mood appears not anxious.  Nursing note and vitals reviewed.    ED Treatments / Results  Labs (all labs ordered are listed, but only abnormal results are displayed) Labs Reviewed  COMPREHENSIVE METABOLIC PANEL - Abnormal; Notable for the following components:      Result Value   Sodium 129 (*)    Potassium 2.8 (*)    Chloride 91 (*)    Glucose, Bld 111 (*)    Creatinine, Ser 1.42 (*)    Calcium 8.3 (*)    GFR calc non Af Amer 42 (*)    GFR calc Af Amer 49 (*)    All other components within normal limits  CBC  WITH DIFFERENTIAL/PLATELET - Abnormal; Notable for the following components:   WBC 13.2 (*)    Hemoglobin 11.4 (*)    MCH 24.4 (*)    RDW 18.0 (*)    Neutro Abs 11.3 (*)    Abs Immature Granulocytes 0.08 (*)    All other components within normal limits  PROTIME-INR - Abnormal; Notable for the following components:   Prothrombin Time 19.8 (*)    All other components within normal limits  URINALYSIS, ROUTINE W REFLEX MICROSCOPIC - Abnormal; Notable for the following components:   APPearance  HAZY (*)    Hgb urine dipstick MODERATE (*)    Protein, ur 30 (*)    Leukocytes, UA MODERATE (*)    WBC, UA >50 (*)    Bacteria, UA RARE (*)    All other components within normal limits  CG4 I-STAT (LACTIC ACID) - Abnormal; Notable for the following components:   Lactic Acid, Venous 1.96 (*)    All other components within normal limits  I-STAT BETA HCG BLOOD, ED (NOT ORDERABLE) - Abnormal; Notable for the following components:   I-stat hCG, quantitative 10.8 (*)    All other components within normal limits  CULTURE, BLOOD (ROUTINE X 2)  CULTURE, BLOOD (ROUTINE X 2)  URINE CULTURE  MAGNESIUM  LIPASE, BLOOD  I-STAT CG4 LACTIC ACID, ED  I-STAT BETA HCG BLOOD, ED ([facility], [facility], [facility] ONLY)  I-STAT CG4 LACTIC ACID, ED  POC URINE PREG, ED  CG4 I-STAT (LACTIC ACID)  POCT PREGNANCY, URINE    EKG EKG Interpretation  Date/Time:  Wednesday September 01 2018 15:53:08 EDT Ventricular Rate:  108 PR Interval:    QRS Duration: 100 QT Interval:  348 QTC Calculation: 467 R Axis:   89 Text Interpretation:  Sinus tachycardia similar to Jan 2019 Confirmed by Sherwood Gambler [id number]) on 09/01/2018 4:02:58 PM   Radiology Dg Chest 2 View  Result Date: 09/01/2018 CLINICAL DATA:  Cough and tachycardia EXAM: CHEST - 2 VIEW COMPARISON:  11/19/2017 FINDINGS: The heart size and mediastinal contours are within normal limits. Both lungs are clear. The visualized skeletal structures are unremarkable. IMPRESSION: Clear  lungs. Electronically Signed   By: Ulyses Jarred M.D.   On: 09/01/2018 14:06   US Abdomen Limited Ruq  Result Date: 09/01/2018 CLINICAL DATA:  Upper abdominal pain EXAM: ULTRASOUND ABDOMEN LIMITED RIGHT UPPER QUADRANT COMPARISON:  CT abdomen/pelvis 11/19/2017 FINDINGS: Gallbladder: No gallstones or wall thickening visualized. No sonographic Murphy sign noted by sonographer. Common bile duct: Diameter: 4.1 mm Liver: Normal hepatic parenchymal echogenicity. 2 x 1.9 x 2.4 cm anechoic left hepatic mass most consistent with a cyst. Portal vein is patent on color Doppler imaging with normal direction of blood flow towards the liver. IMPRESSION: 1. No cholelithiasis or sonographic evidence of acute cholecystitis. Mild increased hepatic echogenicity as can be seen with hepatic steatosis. Electronically Signed   By: Kathreen Devoid   On: 09/01/2018 16:30    Procedures Procedures (including critical care time)  Medications Ordered in ED Medications  fentaNYL (SUBLIMAZE) injection 100 mcg (100 mcg Intravenous Given 09/01/18 1547)  potassium chloride SA (K-DUR,KLOR-CON) CR tablet 40 mEq (40 mEq Oral Given 09/01/18 1547)  lactated ringers bolus 1,000 mL (0 mLs Intravenous Stopped 09/01/18 1733)  lactated ringers bolus 1,000 mL (1,000 mLs Intravenous New Bag/Given 09/01/18 1545)  ibuprofen (ADVIL,MOTRIN) tablet 600 mg (600 mg Oral Given 09/01/18 1621)  cefTRIAXone (ROCEPHIN) 1 g in sodium chloride 0.9 % 100 mL IVPB (0 g Intravenous Stopped 09/01/18 1733)     Initial Impression / Assessment and Plan / ED Course  I have reviewed the triage vital signs and the nursing notes.  Pertinent labs & imaging results that were available during my care of the patient were reviewed by me and considered in my medical decision making (see chart for details).     With the fever and flank pain this is probably pyelonephritis.  She states is very similar to January when she also had pyelonephritis.  Moderate leukocytes  with greater than 50 WBC.  Oddly, this would really explain her upper  abdominal tenderness.  However her LFTs are reassuring and a right upper quadrant ultrasound is benign.  At this point I do not think I would do a CT given no lower abdominal tenderness to suggest something like appendicitis.  She is not having any vomiting.  She has been given fluids.  Given her subtherapeutic INR I think ibuprofen for now is reasonable to help bring her fever down.  She was given IV Rocephin.  Discussed with Dr. Darrick Meigs who will admit.  Final Clinical Impressions(s) / ED Diagnoses   Final diagnoses:  Upper abdominal pain  Acute pyelonephritis    ED Discharge Orders    None       Sherwood Gambler, MD 09/01/18 [phone]

## 2018-09-01 NOTE — ED Notes (Signed)
ED TO INPATIENT HANDOFF REPORT  Name/Age/Gender Lauren Chung 52 y.o. female  Code Status Code Status History    Date Active Date Inactive Code Status Order ID Comments User Context   11/16/2017 0130 11/20/2017 1334 Full Code [id number]  Ivor Costa, MD ED      Home/SNF/Other Home  Chief Complaint Body Pain; Unable to Sleep  Level of Care/Admitting Diagnosis ED Disposition    ED Disposition Condition Onset Hospital Area: [facility] [100102]  Level of Care: Med-Surg [16]  Diagnosis: Fever [751025]  Admitting Physician: Loree Fee  Attending Physician: Oswald Hillock [[id number]]  PT Class (Do Not Modify): Observation [104]  PT Acc Code (Do Not Modify): Observation [10022]       Medical History Past Medical History:  Diagnosis Date  . Anxiety   . Depression   . Lupus ([place])   . PE (pulmonary embolism)   . PTSD (post-traumatic stress disorder)   . Stroke ([place])   . TIA (transient ischemic attack)     Allergies Allergies  Allergen Reactions  . Codeine Shortness Of Breath  . Contrast Media [Iodinated Diagnostic Agents] Shortness Of Breath  . Tylenol [Acetaminophen] Shortness Of Breath    IV Location/Drains/Wounds Patient Lines/Drains/Airways Status   Active Line/Drains/Airways    Name:   Placement date:   Placement time:   Site:   Days:   Peripheral IV 09/01/18 Right Antecubital   09/01/18    1545    Antecubital   less than 1          Labs/Imaging Results for orders placed or performed during the hospital encounter of 09/01/18 (from the past 48 hour(s))  Comprehensive metabolic panel     Status: Abnormal   Collection Time: 09/01/18  1:43 PM  Result Value Ref Range   Sodium 129 (L) 135 - 145 mmol/L   Potassium 2.8 (L) 3.5 - 5.1 mmol/L   Chloride 91 (L) 98 - 111 mmol/L   CO2 25 22 - 32 mmol/L   Glucose, Bld 111 (H) 70 - 99 mg/dL   BUN 12 6 - 20 mg/dL   Creatinine, Ser 1.42 (H) 0.44 - 1.00 mg/dL   Calcium 8.3 (L) 8.9 -  10.3 mg/dL   Total Protein 8.1 6.5 - 8.1 g/dL   Albumin 3.6 3.5 - 5.0 g/dL   AST 36 15 - 41 U/L   ALT 26 0 - 44 U/L   Alkaline Phosphatase 111 38 - 126 U/L   Total Bilirubin 1.1 0.3 - 1.2 mg/dL   GFR calc non Af Amer 42 (L) >60 mL/min   GFR calc Af Amer 49 (L) >60 mL/min    Comment: (NOTE) The eGFR has been calculated using the CKD EPI equation. This calculation has not been validated in all clinical situations. eGFR's persistently <60 mL/min signify possible Chronic Kidney Disease.    Anion gap 13 5 - 15    Comment: Performed at [facility], [place] [address] [place], [place] [address]  CBC with Differential     Status: Abnormal   Collection Time: 09/01/18  1:43 PM  Result Value Ref Range   WBC 13.2 (H) 4.0 - 10.5 K/uL   RBC 4.68 3.87 - 5.11 MIL/uL   Hemoglobin 11.4 (L) 12.0 - 15.0 g/dL   HCT 37.8 36.0 - 46.0 %   MCV 80.8 80.0 - 100.0 fL   MCH 24.4 (L) 26.0 - 34.0 pg   MCHC 30.2 30.0 - 36.0 g/dL  RDW 18.0 (H) 11.5 - 15.5 %   Platelets 346 150 - 400 K/uL   nRBC 0.0 0.0 - 0.2 %   Neutrophils Relative % 86 %   Neutro Abs 11.3 (H) 1.7 - 7.7 K/uL   Lymphocytes Relative 7 %   Lymphs Abs 1.0 0.7 - 4.0 K/uL   Monocytes Relative 6 %   Monocytes Absolute 0.8 0.1 - 1.0 K/uL   Eosinophils Relative 0 %   Eosinophils Absolute 0.0 0.0 - 0.5 K/uL   Basophils Relative 0 %   Basophils Absolute 0.0 0.0 - 0.1 K/uL   Immature Granulocytes 1 %   Abs Immature Granulocytes 0.08 (H) 0.00 - 0.07 K/uL    Comment: Performed at [facility], [place] [address], [place], [place] [address]  Protime-INR     Status: Abnormal   Collection Time: 09/01/18  1:43 PM  Result Value Ref Range   Prothrombin Time 19.8 (H) 11.4 - 15.2 seconds   INR 1.70     Comment: Performed at [facility], [place] [address], [place], [place] [address]  Culture, blood (Routine x 2)     Status: None (Preliminary result)   Collection Time: 09/01/18  1:44 PM  Result Value Ref  Range   Specimen Description      BLOOD LEFT ANTECUBITAL Performed at [place] [address], [place], [place] [address]    Special Requests      BOTTLES DRAWN AEROBIC AND ANAEROBIC Blood Culture adequate volume Performed at [facility], [place] [address] [place], [place] [address]    Culture PENDING    Report Status PENDING   I-Stat beta hCG blood, ED     Status: Abnormal   Collection Time: 09/01/18  1:51 PM  Result Value Ref Range   I-stat hCG, quantitative 10.8 (H) <5 mIU/mL   Comment 3            Comment:   GEST. AGE      CONC.  (mIU/mL)   <=1 WEEK        5 - 50     2 WEEKS       50 - 500     3 WEEKS       100 - 10,000     4 WEEKS     1,000 - 30,000        FEMALE AND NON-PREGNANT FEMALE:     LESS THAN 5 mIU/mL   CG4 I-STAT (Lactic acid)     Status: Abnormal   Collection Time: 09/01/18  1:53 PM  Result Value Ref Range   Lactic Acid, Venous 1.96 (H) 0.5 - 1.9 mmol/L  CG4 I-STAT (Lactic acid)     Status: None   Collection Time: 09/01/18  3:48 PM  Result Value Ref Range   Lactic Acid, Venous 1.87 0.5 - 1.9 mmol/L  Urinalysis, Routine w reflex microscopic     Status: Abnormal   Collection Time: 09/01/18  3:53 PM  Result Value Ref Range   Color, Urine YELLOW YELLOW   APPearance HAZY (A) CLEAR   Specific Gravity, Urine 1.014 1.005 - 1.030   pH 6.0 5.0 - 8.0   Glucose, UA NEGATIVE NEGATIVE mg/dL   Hgb urine dipstick MODERATE (A) NEGATIVE   Bilirubin Urine NEGATIVE NEGATIVE   Ketones, ur NEGATIVE NEGATIVE mg/dL   Protein, ur 30 (A) NEGATIVE mg/dL   Nitrite NEGATIVE NEGATIVE   Leukocytes, UA MODERATE (A) NEGATIVE   RBC / HPF 6-10 0 -  5 RBC/hpf   WBC, UA >50 (H) 0 - 5 WBC/hpf   Bacteria, UA RARE (A) NONE SEEN   Squamous Epithelial / LPF 0-5 0 - 5   Mucus PRESENT     Comment: Performed at [facility], [place] [address], [place],  [address]  Pregnancy, urine POC     Status: None   Collection Time: 09/01/18  3:59 PM   Result Value Ref Range   Preg Test, Ur NEGATIVE NEGATIVE    Comment:        THE SENSITIVITY OF THIS METHODOLOGY IS >24 mIU/mL    Dg Chest 2 View  Result Date: 09/01/2018 CLINICAL DATA:  Cough and tachycardia EXAM: CHEST - 2 VIEW COMPARISON:  11/19/2017 FINDINGS: The heart size and mediastinal contours are within normal limits. Both lungs are clear. The visualized skeletal structures are unremarkable. IMPRESSION: Clear lungs. Electronically Signed   By: Ulyses Jarred M.D.   On: 09/01/2018 14:06   US Abdomen Limited Ruq  Result Date: 09/01/2018 CLINICAL DATA:  Upper abdominal pain EXAM: ULTRASOUND ABDOMEN LIMITED RIGHT UPPER QUADRANT COMPARISON:  CT abdomen/pelvis 11/19/2017 FINDINGS: Gallbladder: No gallstones or wall thickening visualized. No sonographic Murphy sign noted by sonographer. Common bile duct: Diameter: 4.1 mm Liver: Normal hepatic parenchymal echogenicity. 2 x 1.9 x 2.4 cm anechoic left hepatic mass most consistent with a cyst. Portal vein is patent on color Doppler imaging with normal direction of blood flow towards the liver. IMPRESSION: 1. No cholelithiasis or sonographic evidence of acute cholecystitis. Mild increased hepatic echogenicity as can be seen with hepatic steatosis. Electronically Signed   By: Kathreen Devoid   On: 09/01/2018 16:30   EKG Interpretation  Date/Time:  Wednesday September 01 2018 15:53:08 EDT Ventricular Rate:  108 PR Interval:    QRS Duration: 100 QT Interval:  348 QTC Calculation: 467 R Axis:   89 Text Interpretation:  Sinus tachycardia similar to Jan 2019 Confirmed by Sherwood Gambler [phone]) on 09/01/2018 4:02:58 PM   Pending Labs Unresulted Labs (From admission, onward)    Start     Ordered   09/02/18 0500  Protime-INR  Daily,   R     09/01/18 1833   09/01/18 1527  Lipase, blood  Add-on,   STAT     09/01/18 1526   09/01/18 1526  Magnesium  Add-on,   STAT     09/01/18 1526   09/01/18 1519  Urine culture  STAT,   STAT     09/01/18 1518    09/01/18 1315  Culture, blood (Routine x 2)  BLOOD CULTURE X 2,   STAT     09/01/18 1314   Signed and Held  CBC  Tomorrow morning,   R     Signed and Held   Signed and Held  Comprehensive metabolic panel  Tomorrow morning,   R     Signed and Held          Vitals/Pain Today's Vitals   09/01/18 1734 09/01/18 1846 09/01/18 1915 09/01/18 2001  BP:  106/64 97/65 100/68  Pulse:  (!) 102  93  Resp:  18 (!) 22 20  Temp:    98.5 F (36.9 C)  TempSrc:    Oral  SpO2:  99%  99%  Height:      PainSc: 5    3     Isolation Precautions No active isolations  Medications Medications  Warfarin - Pharmacist Dosing Inpatient (has no administration in time range)  fentaNYL (SUBLIMAZE) injection 100  mcg (100 mcg Intravenous Given 09/01/18 1547)  potassium chloride SA (K-DUR,KLOR-CON) CR tablet 40 mEq (40 mEq Oral Given 09/01/18 1547)  lactated ringers bolus 1,000 mL (0 mLs Intravenous Stopped 09/01/18 1733)  lactated ringers bolus 1,000 mL (1,000 mLs Intravenous New Bag/Given 09/01/18 1545)  ibuprofen (ADVIL,MOTRIN) tablet 600 mg (600 mg Oral Given 09/01/18 1621)  cefTRIAXone (ROCEPHIN) 1 g in sodium chloride 0.9 % 100 mL IVPB (0 g Intravenous Stopped 09/01/18 1733)  warfarin (COUMADIN) tablet 3 mg (3 mg Oral Given 09/01/18 1959)    Mobility walks

## 2018-09-01 NOTE — ED Notes (Signed)
Patient is getting CT before going to the floor.

## 2018-09-01 NOTE — H&P (Signed)
TRH H&P    Patient Demographics:    Lauren Chung, is a 52 y.o. female  MRN: [id number]  DOB - 04/21/1966  Admit Date - 09/01/2018  Referring MD/NP/PA: Dr. Regenia Skeeter  Outpatient Primary MD for the patient is Damaris Hippo, MD (Inactive)  Patient coming from: Home  Chief complaint-fever   HPI:    Lauren Chung  is a 52 y.o. female, with history of lupus, pulmonary embolism on anticoagulation with warfarin, stroke, depression came to hospital with 1 week history of fever and generalized body aches.  Patient says tha she started coughing a week ago, denies coughing up any phlegm.  Patient says that coughing had stopped but then she started having generalized body aches.  She denies dysuria, denies chest pain or shortness of breath.  Complains of abdominal pain involving epigastric region, right and left lower quadrants.  Also involving both flank regions. In the ED patient was found to be febrile with temperature 102 F WBC 13,000, tachycardia with heart rate 130.  Patient was empirically started on ceftriaxone for possible UTI.  UA shows 6-10 RBCs per high-power field, greater than 50 WBCs per high-power field. Denies passing out. No nausea vomiting or diarrhea No blurred vision No recent rash   Review of systems:    In addition to the HPI above,    All other systems reviewed and are negative.   With Past History of the following :    Past Medical History:  Diagnosis Date  . Anxiety   . Depression   . Lupus ([place])   . PE (pulmonary embolism)   . PTSD (post-traumatic stress disorder)   . Stroke ([place])   . TIA (transient ischemic attack)       Past Surgical History:  Procedure Laterality Date  . ABDOMINAL SURGERY    . ABLATION        Social History:      Social History   Tobacco Use  . Smoking status: Never Smoker  . Smokeless tobacco: Never Used  Substance Use Topics  . Alcohol  use: No       Family History :     Family History  Problem Relation Age of Onset  . Leukemia Father   . Seizures Daughter   . Drug abuse Mother   . Alcohol abuse Mother       Home Medications:   Prior to Admission medications   Medication Sig Start Date End Date Taking? Authorizing Provider  alprazolam Duanne Moron) 2 MG tablet Take 1 tablet (2 mg total) by mouth at bedtime as needed for sleep. 08/18/18  Yes Plovsky, Berneta Sages, MD  desipramine (NORPRAMIN) 50 MG tablet 2  qam Patient taking differently: Take 100 mg by mouth daily. 2  qam 08/18/18  Yes Plovsky, Berneta Sages, MD  linaclotide [facility]) 72 MCG capsule Take 72 mcg by mouth daily before breakfast.   Yes [provider]  warfarin (COUMADIN) 2 MG tablet Take 2 mg by mouth daily. 08/16/18  Yes [provider]  zolpidem (AMBIEN CR) 12.5 MG CR tablet Take 1 tablet (  12.5 mg total) by mouth at bedtime as needed for sleep. 08/18/18  Yes Plovsky, Berneta Sages, MD  magnesium oxide (MAG-OX) 400 (241.3 Mg) MG tablet Take 0.5 tablets (200 mg total) by mouth 2 (two) times daily. Patient not taking: Reported on 09/01/2018 11/20/17   Regalado, Jerald Kief A, MD  senna-docusate (SENOKOT-S) 8.6-50 MG tablet Take 1 tablet by mouth at bedtime as needed for mild constipation. Patient not taking: Reported on 09/01/2018 11/20/17   Niel Hummer A, MD  warfarin (COUMADIN) 5 MG tablet Take 1 tablet (5 mg total) by mouth daily at 6 PM. Adjust dose based on INR with primary care physician in 5 days Patient not taking: Reported on 09/01/2018 08/11/16   Brunetta Genera, MD     Allergies:     Allergies  Allergen Reactions  . Codeine Shortness Of Breath  . Contrast Media [Iodinated Diagnostic Agents] Shortness Of Breath  . Tylenol [Acetaminophen] Shortness Of Breath     Physical Exam:   Vitals  Blood pressure 119/75, pulse (!) 102, temperature (!) 100.5 F (38.1 C), temperature source Oral, resp. rate 17, height '5\' 8"'$  (1.727 m), SpO2 100  %.  1.  General: Appears in no acute distress  2. Psychiatric:  Intact judgement and  insight, awake alert, oriented x 3.  3. Neurologic: No focal neurological deficits, all cranial nerves intact.Strength 5/5 all 4 extremities, sensation intact all 4 extremities, plantars down going.  4. Eyes :  anicteric sclerae, moist conjunctivae with no lid lag. PERRLA.  5. ENMT:  Oropharynx clear with moist mucous membranes and good dentition  6. Neck:  supple, no cervical lymphadenopathy appriciated, No thyromegaly  7. Respiratory : Normal respiratory effort, good air movement bilaterally,clear to  auscultation bilaterally  8. Cardiovascular : RRR, no gallops, rubs or murmurs, no leg edema  9. Gastrointestinal:  Positive bowel sounds, abdomen soft, generalized tenderness to palpation      10. Skin:  No cyanosis, normal texture and turgor, no rash, lesions or ulcers  11.Musculoskeletal:  Good muscle tone,  joints appear normal , no effusions,  normal range of motion    Data Review:    CBC Recent Labs  Lab 09/01/18 1343  WBC 13.2*  HGB 11.4*  HCT 37.8  PLT 346  MCV 80.8  MCH 24.4*  MCHC 30.2  RDW 18.0*  LYMPHSABS 1.0  MONOABS 0.8  EOSABS 0.0  BASOSABS 0.0   ------------------------------------------------------------------------------------------------------------------  Results for orders placed or performed during the hospital encounter of 09/01/18 (from the past 48 hour(s))  Comprehensive metabolic panel     Status: Abnormal   Collection Time: 09/01/18  1:43 PM  Result Value Ref Range   Sodium 129 (L) 135 - 145 mmol/L   Potassium 2.8 (L) 3.5 - 5.1 mmol/L   Chloride 91 (L) 98 - 111 mmol/L   CO2 25 22 - 32 mmol/L   Glucose, Bld 111 (H) 70 - 99 mg/dL   BUN 12 6 - 20 mg/dL   Creatinine, Ser 1.42 (H) 0.44 - 1.00 mg/dL   Calcium 8.3 (L) 8.9 - 10.3 mg/dL   Total Protein 8.1 6.5 - 8.1 g/dL   Albumin 3.6 3.5 - 5.0 g/dL   AST 36 15 - 41 U/L   ALT 26 0 - 44 U/L    Alkaline Phosphatase 111 38 - 126 U/L   Total Bilirubin 1.1 0.3 - 1.2 mg/dL   GFR calc non Af Amer 42 (L) >60 mL/min   GFR calc Af Amer 49 (L) >60 mL/min  Comment: (NOTE) The eGFR has been calculated using the CKD EPI equation. This calculation has not been validated in all clinical situations. eGFR's persistently <60 mL/min signify possible Chronic Kidney Disease.    Anion gap 13 5 - 15    Comment: Performed at [facility], [place] [address] [place], [place] [address]  CBC with Differential     Status: Abnormal   Collection Time: 09/01/18  1:43 PM  Result Value Ref Range   WBC 13.2 (H) 4.0 - 10.5 K/uL   RBC 4.68 3.87 - 5.11 MIL/uL   Hemoglobin 11.4 (L) 12.0 - 15.0 g/dL   HCT 37.8 36.0 - 46.0 %   MCV 80.8 80.0 - 100.0 fL   MCH 24.4 (L) 26.0 - 34.0 pg   MCHC 30.2 30.0 - 36.0 g/dL   RDW 18.0 (H) 11.5 - 15.5 %   Platelets 346 150 - 400 K/uL   nRBC 0.0 0.0 - 0.2 %   Neutrophils Relative % 86 %   Neutro Abs 11.3 (H) 1.7 - 7.7 K/uL   Lymphocytes Relative 7 %   Lymphs Abs 1.0 0.7 - 4.0 K/uL   Monocytes Relative 6 %   Monocytes Absolute 0.8 0.1 - 1.0 K/uL   Eosinophils Relative 0 %   Eosinophils Absolute 0.0 0.0 - 0.5 K/uL   Basophils Relative 0 %   Basophils Absolute 0.0 0.0 - 0.1 K/uL   Immature Granulocytes 1 %   Abs Immature Granulocytes 0.08 (H) 0.00 - 0.07 K/uL    Comment: Performed at [facility], [place] [address] [place], [place] [address]  Protime-INR     Status: Abnormal   Collection Time: 09/01/18  1:43 PM  Result Value Ref Range   Prothrombin Time 19.8 (H) 11.4 - 15.2 seconds   INR 1.70     Comment: Performed at [facility], [place] [address] [place], [place] [address]  Culture, blood (Routine x 2)     Status: None (Preliminary result)   Collection Time: 09/01/18  1:44 PM  Result Value Ref Range   Specimen Description      BLOOD LEFT ANTECUBITAL Performed at [place] [address] [place], [place] [address]    Special Requests      BOTTLES DRAWN AEROBIC AND ANAEROBIC Blood Culture adequate volume Performed at [facility], [place] [address] [place], [place] [address]    Culture PENDING    Report Status PENDING   I-Stat beta hCG blood, ED     Status: Abnormal   Collection Time: 09/01/18  1:51 PM  Result Value Ref Range   I-stat hCG, quantitative 10.8 (H) <5 mIU/mL   Comment 3            Comment:   GEST. AGE      CONC.  (mIU/mL)   <=1 WEEK        5 - 50     2 WEEKS       50 - 500     3 WEEKS       100 - 10,000     4 WEEKS     1,000 - 30,000        FEMALE AND NON-PREGNANT FEMALE:     LESS THAN 5 mIU/mL   CG4 I-STAT (Lactic acid)     Status: Abnormal   Collection Time: 09/01/18  1:53 PM  Result Value Ref Range   Lactic Acid, Venous 1.96 (H) 0.5 - 1.9 mmol/L  CG4 I-STAT (Lactic acid)     Status: None   Collection Time: 09/01/18  3:48 PM  Result Value Ref Range   Lactic Acid, Venous 1.87 0.5 - 1.9 mmol/L  Urinalysis, Routine w reflex microscopic     Status: Abnormal   Collection Time: 09/01/18  3:53 PM  Result Value Ref Range   Color, Urine YELLOW YELLOW   APPearance HAZY (A) CLEAR   Specific Gravity, Urine 1.014 1.005 - 1.030   pH 6.0 5.0 - 8.0   Glucose, UA NEGATIVE NEGATIVE mg/dL   Hgb urine dipstick MODERATE (A) NEGATIVE   Bilirubin Urine NEGATIVE NEGATIVE   Ketones, ur NEGATIVE NEGATIVE mg/dL   Protein, ur 30 (A) NEGATIVE mg/dL   Nitrite NEGATIVE NEGATIVE   Leukocytes, UA MODERATE (A) NEGATIVE   RBC / HPF 6-10 0 - 5 RBC/hpf   WBC, UA >50 (H) 0 - 5 WBC/hpf   Bacteria, UA RARE (A) NONE SEEN   Squamous Epithelial / LPF 0-5 0 - 5   Mucus PRESENT     Comment: Performed at [facility], [place] [address] [place], [place] [address]  Pregnancy, urine POC     Status: None   Collection Time: 09/01/18  3:59 PM  Result Value Ref Range   Preg Test, Ur NEGATIVE NEGATIVE    Comment:        THE SENSITIVITY OF THIS METHODOLOGY IS >24  mIU/mL     Chemistries  Recent Labs  Lab 09/01/18 1343  NA 129*  K 2.8*  CL 91*  CO2 25  GLUCOSE 111*  BUN 12  CREATININE 1.42*  CALCIUM 8.3*  AST 36  ALT 26  ALKPHOS 111  BILITOT 1.1   ------------------------------------------------------------------------------------------------------------------  ------------------------------------------------------------------------------------------------------------------ GFR: CrCl cannot be calculated (Unknown ideal weight.). Liver Function Tests: Recent Labs  Lab 09/01/18 1343  AST 36  ALT 26  ALKPHOS 111  BILITOT 1.1  PROT 8.1  ALBUMIN 3.6   No results for input(s): LIPASE, AMYLASE in the last 168 hours. No results for input(s): AMMONIA in the last 168 hours. Coagulation Profile: Recent Labs  Lab 09/01/18 1343  INR 1.70   Cardiac Enzymes: No results for input(s): CKTOTAL, CKMB, CKMBINDEX, TROPONINI in the last 168 hours. BNP (last 3 results) No results for input(s): PROBNP in the last 8760 hours. HbA1C: No results for input(s): HGBA1C in the last 72 hours. CBG: No results for input(s): GLUCAP in the last 168 hours. Lipid Profile: No results for input(s): CHOL, HDL, LDLCALC, TRIG, CHOLHDL, LDLDIRECT in the last 72 hours. Thyroid Function Tests: No results for input(s): TSH, T4TOTAL, FREET4, T3FREE, THYROIDAB in the last 72 hours. Anemia Panel: No results for input(s): VITAMINB12, FOLATE, FERRITIN, TIBC, IRON, RETICCTPCT in the last 72 hours.  --------------------------------------------------------------------------------------------------------------- Urine analysis:    Component Value Date/Time   COLORURINE YELLOW 09/01/2018 1553   APPEARANCEUR HAZY (A) 09/01/2018 1553   LABSPEC 1.014 09/01/2018 1553   PHURINE 6.0 09/01/2018 1553   GLUCOSEU NEGATIVE 09/01/2018 1553   HGBUR MODERATE (A) 09/01/2018 1553   BILIRUBINUR NEGATIVE 09/01/2018 1553   [place] 09/01/2018 1553   PROTEINUR 30 (A)  09/01/2018 1553   NITRITE NEGATIVE 09/01/2018 1553   LEUKOCYTESUR MODERATE (A) 09/01/2018 1553      Imaging Results:    Dg Chest 2 View  Result Date: 09/01/2018 CLINICAL DATA:  Cough and tachycardia EXAM: CHEST - 2 VIEW COMPARISON:  11/19/2017 FINDINGS: The heart size and mediastinal contours are within normal limits. Both lungs are clear. The visualized  skeletal structures are unremarkable. IMPRESSION: Clear lungs. Electronically Signed   By: Ulyses Jarred M.D.   On: 09/01/2018 14:06   US Abdomen Limited Ruq  Result Date: 09/01/2018 CLINICAL DATA:  Upper abdominal pain EXAM: ULTRASOUND ABDOMEN LIMITED RIGHT UPPER QUADRANT COMPARISON:  CT abdomen/pelvis 11/19/2017 FINDINGS: Gallbladder: No gallstones or wall thickening visualized. No sonographic Murphy sign noted by sonographer. Common bile duct: Diameter: 4.1 mm Liver: Normal hepatic parenchymal echogenicity. 2 x 1.9 x 2.4 cm anechoic left hepatic mass most consistent with a cyst. Portal vein is patent on color Doppler imaging with normal direction of blood flow towards the liver. IMPRESSION: 1. No cholelithiasis or sonographic evidence of acute cholecystitis. Mild increased hepatic echogenicity as can be seen with hepatic steatosis. Electronically Signed   By: Kathreen Devoid   On: 09/01/2018 16:30    My personal review of EKG: Rhythm NSR   Assessment & Plan:    Active Problems:   Fever   1. Fever/SIRS-patient presented with fever, tachycardia, leukocytosis, no clear source of infection.  Initial lactic acid was 1.96.  Patient empirically started on ceftriaxone for possible UTI/pyelonephritis.  Patient had similar presentation in January at that time urine culture grew E. coli.  Continue ceftriaxone, follow urine culture results.  2. Abdominal pain-patient is presenting with denies abdominal pain, no nausea or vomiting.  Will obtain CT scan of the abdomen and pelvis without contrast since patient has contrast allergy and mild  AKI.  3. Acute kidney injury-patient's creatinine is 1.42, baseline creatinine is around 0.96.  Started on IV normal saline.  Follow BMP in am.  4. History of pulmonary embolism-continue Coumadin per pharmacy.  5. History of lupus-stable not on medications for lupus.  6. History of depression-continue desipramine. Ambien, Xanax as needed for insomnia    DVT Prophylaxis-   Lovenox   AM Labs Ordered, also please review Full Orders  Family Communication: Admission, patients condition and plan of care including tests being ordered have been discussed with the patient who indicate understanding and agree with the plan and Code Status.  Code Status: Full code  Admission status: Observation   Time spent in minutes : 60 minutes   Oswald Hillock M.D on 09/01/2018 at 6:26 PM  Between 7am to 7pm - Pager - [phone]. After 7pm go to [email or web address] - password [facility]  [facility] - Office  [phone]

## 2018-09-01 NOTE — ED Triage Notes (Signed)
Pt reports has lupus and having a flare up for past two days had body aches, fatigue and not able to sleep even taking Ambien.

## 2018-09-01 NOTE — Progress Notes (Signed)
ANTICOAGULATION CONSULT NOTE - Initial Consult  Pharmacy Consult for warfarin Indication: Hx PE  Allergies  Allergen Reactions  . Codeine Shortness Of Breath  . Contrast Media [Iodinated Diagnostic Agents] Shortness Of Breath  . Tylenol [Acetaminophen] Shortness Of Breath    Patient Measurements: Height: 5\' 8"  (172.7 cm) IBW/kg (Calculated) : 63.9  Vital Signs: Temp: 100.5 F (38.1 C) (10/30 1730) Temp Source: Oral (10/30 1730) BP: 119/75 (10/30 1730) Pulse Rate: 102 (10/30 1730)  Labs: Recent Labs    09/01/18 1343  HGB 11.4*  HCT 37.8  PLT 346  LABPROT 19.8*  INR 1.70  CREATININE 1.42*    CrCl cannot be calculated (Unknown ideal weight.).   Medical History: Past Medical History:  Diagnosis Date  . Anxiety   . Depression   . Lupus ([place])   . PE (pulmonary embolism)   . PTSD (post-traumatic stress disorder)   . Stroke ([place])   . TIA (transient ischemic attack)     Medications:   (Not in a hospital admission) Scheduled:  . warfarin  3 mg Oral ONCE-1800  . [START ON 09/02/2018] Warfarin - Pharmacist Dosing Inpatient   Does not apply q1800   PRN:   Assessment: Lauren Chung with PMH lupus, PE on warfarin, TIA, depression, admitted with FUO; possible UTI. Pharmacy to dose warfarin while admitted.   Baseline INR 1.7  Prior anticoagulation: warfarin 2 mg daily, LD 10/29  Significant events:  Today, 09/01/2018:  CBC: Hgb slightly low, Plt wnl  INR subtherapeutic  Major drug interactions: broad spectrum abx may increase warfarin sensitivity  No bleeding issues per nursing  Currently NPO  Goal of Therapy: INR 2-3  Plan:  Warfarin 3 mg PO tonight; INR low but probably no need to be more aggressive given acute illness and new abx  Daily INR  CBC at least q72 hr while on warfarin  Monitor for signs of bleeding or thrombosis   Reuel Boom, PharmD, BCPS [phone] 09/01/2018, 6:35 PM

## 2018-09-02 DIAGNOSIS — R509 Fever, unspecified: Secondary | ICD-10-CM

## 2018-09-02 DIAGNOSIS — M549 Dorsalgia, unspecified: Secondary | ICD-10-CM | POA: Diagnosis not present

## 2018-09-02 DIAGNOSIS — M329 Systemic lupus erythematosus, unspecified: Secondary | ICD-10-CM | POA: Diagnosis present

## 2018-09-02 DIAGNOSIS — Z885 Allergy status to narcotic agent status: Secondary | ICD-10-CM

## 2018-09-02 DIAGNOSIS — N10 Acute tubulo-interstitial nephritis: Secondary | ICD-10-CM | POA: Diagnosis present

## 2018-09-02 DIAGNOSIS — Z91041 Radiographic dye allergy status: Secondary | ICD-10-CM | POA: Diagnosis not present

## 2018-09-02 DIAGNOSIS — R8281 Pyuria: Secondary | ICD-10-CM | POA: Diagnosis not present

## 2018-09-02 DIAGNOSIS — Z7901 Long term (current) use of anticoagulants: Secondary | ICD-10-CM | POA: Diagnosis not present

## 2018-09-02 DIAGNOSIS — Z87448 Personal history of other diseases of urinary system: Secondary | ICD-10-CM | POA: Diagnosis not present

## 2018-09-02 DIAGNOSIS — F4310 Post-traumatic stress disorder, unspecified: Secondary | ICD-10-CM | POA: Diagnosis present

## 2018-09-02 DIAGNOSIS — R109 Unspecified abdominal pain: Secondary | ICD-10-CM

## 2018-09-02 DIAGNOSIS — Z86711 Personal history of pulmonary embolism: Secondary | ICD-10-CM | POA: Diagnosis not present

## 2018-09-02 DIAGNOSIS — R51 Headache: Secondary | ICD-10-CM

## 2018-09-02 DIAGNOSIS — I959 Hypotension, unspecified: Secondary | ICD-10-CM | POA: Diagnosis not present

## 2018-09-02 DIAGNOSIS — R5383 Other fatigue: Secondary | ICD-10-CM | POA: Diagnosis not present

## 2018-09-02 DIAGNOSIS — N179 Acute kidney failure, unspecified: Secondary | ICD-10-CM | POA: Diagnosis present

## 2018-09-02 DIAGNOSIS — R6510 Systemic inflammatory response syndrome (SIRS) of non-infectious origin without acute organ dysfunction: Secondary | ICD-10-CM

## 2018-09-02 DIAGNOSIS — Z806 Family history of leukemia: Secondary | ICD-10-CM | POA: Diagnosis not present

## 2018-09-02 DIAGNOSIS — G4700 Insomnia, unspecified: Secondary | ICD-10-CM | POA: Diagnosis present

## 2018-09-02 DIAGNOSIS — R8271 Bacteriuria: Secondary | ICD-10-CM | POA: Diagnosis not present

## 2018-09-02 DIAGNOSIS — D649 Anemia, unspecified: Secondary | ICD-10-CM | POA: Diagnosis present

## 2018-09-02 DIAGNOSIS — Z79899 Other long term (current) drug therapy: Secondary | ICD-10-CM | POA: Diagnosis not present

## 2018-09-02 DIAGNOSIS — Z8673 Personal history of transient ischemic attack (TIA), and cerebral infarction without residual deficits: Secondary | ICD-10-CM | POA: Diagnosis not present

## 2018-09-02 DIAGNOSIS — A419 Sepsis, unspecified organism: Secondary | ICD-10-CM | POA: Diagnosis present

## 2018-09-02 DIAGNOSIS — Z888 Allergy status to other drugs, medicaments and biological substances status: Secondary | ICD-10-CM | POA: Diagnosis not present

## 2018-09-02 DIAGNOSIS — B9620 Unspecified Escherichia coli [E. coli] as the cause of diseases classified elsewhere: Secondary | ICD-10-CM | POA: Diagnosis present

## 2018-09-02 DIAGNOSIS — F332 Major depressive disorder, recurrent severe without psychotic features: Secondary | ICD-10-CM | POA: Diagnosis present

## 2018-09-02 DIAGNOSIS — R1010 Upper abdominal pain, unspecified: Secondary | ICD-10-CM | POA: Diagnosis not present

## 2018-09-02 DIAGNOSIS — I10 Essential (primary) hypertension: Secondary | ICD-10-CM | POA: Diagnosis present

## 2018-09-02 DIAGNOSIS — E876 Hypokalemia: Secondary | ICD-10-CM | POA: Diagnosis not present

## 2018-09-02 DIAGNOSIS — Z86718 Personal history of other venous thrombosis and embolism: Secondary | ICD-10-CM | POA: Diagnosis not present

## 2018-09-02 DIAGNOSIS — R5381 Other malaise: Secondary | ICD-10-CM | POA: Diagnosis not present

## 2018-09-02 LAB — COMPREHENSIVE METABOLIC PANEL
ALK PHOS: 93 U/L (ref 38–126)
ALT: 19 U/L (ref 0–44)
ANION GAP: 9 (ref 5–15)
AST: 28 U/L (ref 15–41)
Albumin: 2.6 g/dL — ABNORMAL LOW (ref 3.5–5.0)
BILIRUBIN TOTAL: 0.9 mg/dL (ref 0.3–1.2)
BUN: 11 mg/dL (ref 6–20)
CALCIUM: 7.8 mg/dL — AB (ref 8.9–10.3)
CO2: 24 mmol/L (ref 22–32)
Chloride: 102 mmol/L (ref 98–111)
Creatinine, Ser: 1.05 mg/dL — ABNORMAL HIGH (ref 0.44–1.00)
Glucose, Bld: 83 mg/dL (ref 70–99)
Potassium: 4 mmol/L (ref 3.5–5.1)
SODIUM: 135 mmol/L (ref 135–145)
TOTAL PROTEIN: 6.8 g/dL (ref 6.5–8.1)

## 2018-09-02 LAB — PROTIME-INR
INR: 1.54
PROTHROMBIN TIME: 18.3 s — AB (ref 11.4–15.2)

## 2018-09-02 LAB — CBC
HCT: 30.6 % — ABNORMAL LOW (ref 36.0–46.0)
HEMOGLOBIN: 9.4 g/dL — AB (ref 12.0–15.0)
MCH: 24.6 pg — AB (ref 26.0–34.0)
MCHC: 30.7 g/dL (ref 30.0–36.0)
MCV: 80.1 fL (ref 80.0–100.0)
Platelets: 286 10*3/uL (ref 150–400)
RBC: 3.82 MIL/uL — ABNORMAL LOW (ref 3.87–5.11)
RDW: 18.2 % — ABNORMAL HIGH (ref 11.5–15.5)
WBC: 13.2 10*3/uL — ABNORMAL HIGH (ref 4.0–10.5)
nRBC: 0 % (ref 0.0–0.2)

## 2018-09-02 LAB — MRSA PCR SCREENING: MRSA BY PCR: NEGATIVE

## 2018-09-02 MED ORDER — VANCOMYCIN HCL 10 G IV SOLR
1500.0000 mg | INTRAVENOUS | Status: DC
  Administered 2018-09-02: 1500 mg via INTRAVENOUS
  Filled 2018-09-02 (×2): qty 1500

## 2018-09-02 MED ORDER — SODIUM CHLORIDE 0.9 % IV BOLUS
500.0000 mL | Freq: Once | INTRAVENOUS | Status: AC
  Administered 2018-09-02: 500 mL via INTRAVENOUS

## 2018-09-02 MED ORDER — WARFARIN SODIUM 4 MG PO TABS
4.0000 mg | ORAL_TABLET | Freq: Once | ORAL | Status: AC
  Administered 2018-09-02: 4 mg via ORAL
  Filled 2018-09-02 (×2): qty 1

## 2018-09-02 MED ORDER — SODIUM CHLORIDE 0.9 % IV SOLN
1.0000 g | Freq: Three times a day (TID) | INTRAVENOUS | Status: DC
  Administered 2018-09-02 – 2018-09-04 (×5): 1 g via INTRAVENOUS
  Filled 2018-09-02 (×7): qty 1

## 2018-09-02 MED ORDER — IBUPROFEN 200 MG PO TABS
600.0000 mg | ORAL_TABLET | Freq: Four times a day (QID) | ORAL | Status: DC | PRN
  Administered 2018-09-02 – 2018-09-04 (×4): 600 mg via ORAL
  Filled 2018-09-02 (×4): qty 3

## 2018-09-02 MED ORDER — ALPRAZOLAM 1 MG PO TABS
2.0000 mg | ORAL_TABLET | Freq: Every evening | ORAL | Status: DC | PRN
  Administered 2018-09-02 – 2018-09-04 (×3): 2 mg via ORAL
  Filled 2018-09-02 (×3): qty 2

## 2018-09-02 MED ORDER — ZOLPIDEM TARTRATE 5 MG PO TABS
5.0000 mg | ORAL_TABLET | Freq: Every evening | ORAL | Status: DC | PRN
  Administered 2018-09-02 – 2018-09-04 (×3): 5 mg via ORAL
  Filled 2018-09-02 (×3): qty 1

## 2018-09-02 NOTE — Progress Notes (Signed)
Pharmacy Antibiotic Note  Lauren Chung is a 52 y.o. female admitted on 09/01/2018 with SIRS.  Pharmacy has been consulted for vancomycin and ceftazidime dosing.  Plan: Ceftazidime 1g IV q8h Vancomycin 1500mg  IV q24h, estimated AUC 494 Check vancomycin levels at steady state Follow up renal function & cultures  Height: 5\' 8"  (172.7 cm) IBW/kg (Calculated) : 63.9  Temp (24hrs), Avg:100.9 F (38.3 C), Min:98.3 F (36.8 C), Max:102.8 F (39.3 C)  Recent Labs  Lab 09/01/18 1343 09/01/18 1353 09/01/18 1548 09/02/18 0616  WBC 13.2*  --   --  13.2*  CREATININE 1.42*  --   --  1.05*  LATICACIDVEN  --  1.96* 1.87  --     CrCl cannot be calculated (Unknown ideal weight.).    Allergies  Allergen Reactions  . Codeine Shortness Of Breath  . Contrast Media [Iodinated Diagnostic Agents] Shortness Of Breath  . Tylenol [Acetaminophen] Shortness Of Breath    Antimicrobials this admission: 10/30 Rocephin >> 10/31 10/31 Ceftazidime >> 10/31 Vancomycin >>  Dose adjustments this admission:   Microbiology results: 10/30 BCx: ngtd 10/30 UCx: sent  Thank you for allowing pharmacy to be a part of this patient's care.  Peggyann Juba, PharmD, BCPS Pager: [phone] 09/02/2018 3:32 PM

## 2018-09-02 NOTE — Care Management Obs Status (Signed)
[facility] NOTIFICATION   Patient Details  Name: Lauren Chung MRN: [id number] Date of Birth: January 16, 1966   Medicare Observation Status Notification Given:  Yes    Leeroy Cha, RN 09/02/2018, 1:13 PM

## 2018-09-02 NOTE — Progress Notes (Signed)
[facility]  PROGRESS NOTE  Lauren Chung EGB:[id number] DOB: 1966/09/14 DOA: 09/01/2018 PCP: Damaris Hippo, MD (Inactive)   Brief HPI:   52 year old female with a history of lupus, pulmonary embolism on chronic anticoagulation on warfarin, stroke, depression came with 1 week history of fever and generalized body aches.  Patient had cough 1 week ago with coughing stopped and patient continues to have fever with generalized body aches.  In the ED patient presented with temperature 102 F, leukocytosis WBC 13,000, tachycardia with heart rate 130s.    Subjective   Patient seen and examined, denies any new complaints.  Still has abdominal pain.  CT abdomen pelvis showed no acute abnormality.   Assessment/Plan:     1. SIRS-patient continues to have fever, hypotension mild tachycardia, leukocytosis.  Called and discussed with Dr. Megan Salon who will see the patient today.  As patient has developed hypotension, will start vancomycin and ceftazidime.  ID to follow and make recommendations.  Follow blood and urine culture results.  2. Abdominal pain-unclear etiology, CT abdomen pelvis obtained yesterday showed no acute abnormality.  We will continue to monitor.  3. Acute kidney injury-patient presented with creatinine of 1.42, it has improved to 1.05 with IV fluids.  Follow BMP in am.  4. History of pulmonary embolism-continue Coumadin per pharmacy  5. History of lupus-stable  6. History of depression-continue desipramine, Ambien, Xanax as needed     CBG: No results for input(s): GLUCAP in the last 168 hours.  CBC: Recent Labs  Lab 09/01/18 1343 09/02/18 0616  WBC 13.2* 13.2*  NEUTROABS 11.3*  --   HGB 11.4* 9.4*  HCT 37.8 30.6*  MCV 80.8 80.1  PLT 346 371    Basic Metabolic Panel: Recent Labs  Lab 09/01/18 1343 09/01/18 2200 09/02/18 0616  NA 129*  --  135  K 2.8*  --  4.0  CL 91*  --  102  CO2 25  --  24  GLUCOSE 111*  --  83  BUN 12  --  11  CREATININE  1.42*  --  1.05*  CALCIUM 8.3*  --  7.8*  MG  --  1.6*  --      DVT prophylaxis: Warfarin  Code Status: Full code  Family Communication: No family at bedside  Disposition Plan: likely home when medically ready for discharge   Consultants:  None  Procedures:  None   Antibiotics:   Anti-infectives (From admission, onward)   Start     Dose/Rate Route Frequency Ordered Stop   09/02/18 1600  cefTRIAXone (ROCEPHIN) 1 g in sodium chloride 0.9 % 100 mL IVPB  Status:  Discontinued     1 g 200 mL/hr over 30 Minutes Intravenous Every 24 hours 09/01/18 2037 09/02/18 1529   09/02/18 1600  vancomycin (VANCOCIN) 1,500 mg in sodium chloride 0.9 % 500 mL IVPB     1,500 mg 250 mL/hr over 120 Minutes Intravenous Every 24 hours 09/02/18 1538     09/02/18 1600  cefTAZidime (FORTAZ) 1 g in sodium chloride 0.9 % 100 mL IVPB     1 g 200 mL/hr over 30 Minutes Intravenous Every 8 hours 09/02/18 1539     09/01/18 1645  cefTRIAXone (ROCEPHIN) 1 g in sodium chloride 0.9 % 100 mL IVPB     1 g 200 mL/hr over 30 Minutes Intravenous  Once 09/01/18 1635 09/01/18 1733       Objective   Vitals:   09/02/18 1011 09/02/18 1021 09/02/18 1437 09/02/18 1513  BP:   Marland Kitchen)  79/56 (!) 88/60  Pulse:  98 (!) 101   Resp: 18  19   Temp: (!) 102.8 F (39.3 C) (!) 102.8 F (39.3 C) 98.9 F (37.2 C)   TempSrc: Oral Oral Oral   SpO2:   98%   Height:        Intake/Output Summary (Last 24 hours) at 09/02/2018 1545 Last data filed at 09/02/2018 1338 Gross per 24 hour  Intake 1340 ml  Output -  Net 1340 ml   There were no vitals filed for this visit.   Physical Examination:    General: Appears in no acute distress  Cardiovascular: S1-S2, regular  Respiratory: Clear to auscultation bilaterally  Abdomen: Soft, positive epigastric tenderness, right upper quadrant tenderness to palpation  Extremities: No edema in the lower extremities  Neurologic: Alert, oriented x3, no focal deficit  noted     Data Reviewed: I have personally reviewed following labs and imaging studies   Recent Results (from the past 240 hour(s))  Culture, blood (Routine x 2)     Status: None (Preliminary result)   Collection Time: 09/01/18  1:20 PM  Result Value Ref Range Status   Specimen Description   Final    BLOOD RIGHT ANTECUBITAL Performed at [facility] [address], [place], [place] [address]    Special Requests   Final    Blood Culture results may not be optimal due to an inadequate volume of blood received in culture bottles BOTTLES DRAWN AEROBIC AND ANAEROBIC   Culture   Final    NO GROWTH < 24 HOURS Performed at [facility], [place] [address], [place], [place] [address]    Report Status PENDING  Incomplete  Culture, blood (Routine x 2)     Status: None (Preliminary result)   Collection Time: 09/01/18  1:44 PM  Result Value Ref Range Status   Specimen Description   Final    BLOOD LEFT ANTECUBITAL Performed at [facility] [address] [place], [place] [address]    Special Requests   Final    BOTTLES DRAWN AEROBIC AND ANAEROBIC Blood Culture adequate volume   Culture   Final    NO GROWTH < 24 HOURS Performed at [facility], [place] [address], [place], [place] [address]    Report Status PENDING  Incomplete     Liver Function Tests: Recent Labs  Lab 09/01/18 1343 09/02/18 0616  AST 36 28  ALT 26 19  ALKPHOS 111 93  BILITOT 1.1 0.9  PROT 8.1 6.8  ALBUMIN 3.6 2.6*   Recent Labs  Lab 09/01/18 2200  LIPASE 22       Studies: Ct Abdomen Pelvis Wo Contrast  Result Date: 09/01/2018 CLINICAL DATA:  Fever, leukocytosis.  Abdominal pain. EXAM: CT ABDOMEN AND PELVIS WITHOUT CONTRAST TECHNIQUE: Multidetector CT imaging of the abdomen and pelvis was performed following the standard protocol without IV contrast. COMPARISON:  CT scan of November 19, 2017. FINDINGS: Lower chest: No acute abnormality. Hepatobiliary: No  gallstones or biliary dilatation is noted. Stable left hepatic cyst is noted. Pancreas: Unremarkable. No pancreatic ductal dilatation or surrounding inflammatory changes. Spleen: Normal in size without focal abnormality. Adrenals/Urinary Tract: Adrenal glands are unremarkable. Kidneys are normal, without renal calculi, focal lesion, or hydronephrosis. Bladder is unremarkable. Stomach/Bowel: Stomach is within normal limits. Appendix appears normal. No evidence of bowel wall thickening, distention, or inflammatory changes. Vascular/Lymphatic: No significant vascular findings are present. No enlarged abdominal or pelvic lymph nodes. Reproductive:  Uterus and bilateral adnexa are unremarkable. Other: No abdominal wall hernia or abnormality. No abdominopelvic ascites. Musculoskeletal: No acute or significant osseous findings. IMPRESSION: No acute abnormality seen in the abdomen or pelvis. Electronically Signed   By: Marijo Conception, M.D.   On: 09/01/2018 20:55   Dg Chest 2 View  Result Date: 09/01/2018 CLINICAL DATA:  Cough and tachycardia EXAM: CHEST - 2 VIEW COMPARISON:  11/19/2017 FINDINGS: The heart size and mediastinal contours are within normal limits. Both lungs are clear. The visualized skeletal structures are unremarkable. IMPRESSION: Clear lungs. Electronically Signed   By: Ulyses Jarred M.D.   On: 09/01/2018 14:06   US Abdomen Limited Ruq  Result Date: 09/01/2018 CLINICAL DATA:  Upper abdominal pain EXAM: ULTRASOUND ABDOMEN LIMITED RIGHT UPPER QUADRANT COMPARISON:  CT abdomen/pelvis 11/19/2017 FINDINGS: Gallbladder: No gallstones or wall thickening visualized. No sonographic Murphy sign noted by sonographer. Common bile duct: Diameter: 4.1 mm Liver: Normal hepatic parenchymal echogenicity. 2 x 1.9 x 2.4 cm anechoic left hepatic mass most consistent with a cyst. Portal vein is patent on color Doppler imaging with normal direction of blood flow towards the liver. IMPRESSION: 1. No cholelithiasis or  sonographic evidence of acute cholecystitis. Mild increased hepatic echogenicity as can be seen with hepatic steatosis. Electronically Signed   By: Kathreen Devoid   On: 09/01/2018 16:30    Scheduled Meds: . desipramine  100 mg Oral Daily  . warfarin  4 mg Oral ONCE-1800  . Warfarin - Pharmacist Dosing Inpatient   Does not apply q1800    Admission status: Inpatient: Based on patients clinical presentation and evaluation of above clinical data, I have made determination that patient meets Inpatient criteria at this time.  Patient came with fever/SIRS, started on IV antibiotics, ID consulted.  Patient hypotensive so will be transferred to stepdown unit.  Time spent: 30 min  [facility] Pager [phone]. If 7PM-7AM, please contact night-coverage at [email or web address], Office  [phone]  password [email or web address]  09/02/2018, 3:45 PM  LOS: 0 days

## 2018-09-02 NOTE — Plan of Care (Signed)
  Problem: Elimination: Goal: Will not experience complications related to bowel motility Outcome: Progressing   Problem: Pain Managment: Goal: General experience of comfort will improve Outcome: Progressing   Problem: Nutrition: Goal: Adequate nutrition will be maintained Outcome: Progressing   
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
## 2018-09-02 NOTE — Progress Notes (Addendum)
   09/02/18 0503  MEWS Score  Resp 18  Pulse Rate (!) 115 (nurse notified)  BP 114/70  Temp (!) 102.7 F (39.3 C) (nurse notified)  SpO2 100 %  O2 Device Room Air  MEWS Score  MEWS RR 0  MEWS Pulse 2  MEWS Systolic 0  MEWS LOC 0  MEWS Temp 2  MEWS Score 4  MEWS Score Color Red  MEWS Assessment  Is this an acute change? No  Provider Notification  Provider Name/Title Bodenheimer, NP  Date Provider Notified 09/02/18  Time Provider Notified [phone]  Notification Type Page  Notification Reason Other (Comment) (Fever)  Response No new orders  Date of Provider Response 09/02/18  Time of Provider Response 0521   Pt is allergic to tylenol and cannot receive Ibuprofen at this time due to AKI per on call.  Ice packs applied to pt.

## 2018-09-02 NOTE — Progress Notes (Signed)
ANTICOAGULATION CONSULT NOTE - follow up  Pharmacy Consult for warfarin Indication: Hx PE  Allergies  Allergen Reactions  . Codeine Shortness Of Breath  . Contrast Media [Iodinated Diagnostic Agents] Shortness Of Breath  . Tylenol [Acetaminophen] Shortness Of Breath    Patient Measurements: Height: 5\' 8"  (172.7 cm) IBW/kg (Calculated) : 63.9  Vital Signs: Temp: 102.8 F (39.3 C) (10/31 1011) Temp Source: Oral (10/31 1011) BP: 99/63 (10/31 0637) Pulse Rate: 98 (10/31 1011)  Labs: Recent Labs    09/01/18 1343 09/02/18 0616  HGB 11.4* 9.4*  HCT 37.8 30.6*  PLT 346 286  LABPROT 19.8* 18.3*  INR 1.70 1.54  CREATININE 1.42* 1.05*    CrCl cannot be calculated (Unknown ideal weight.).   Medical History: Past Medical History:  Diagnosis Date  . Anxiety   . Depression   . Lupus ([place])   . PE (pulmonary embolism)   . PTSD (post-traumatic stress disorder)   . Stroke ([place])   . TIA (transient ischemic attack)     Medications:  Medications Prior to Admission  Medication Sig Dispense Refill Last Dose  . alprazolam (XANAX) 2 MG tablet Take 1 tablet (2 mg total) by mouth at bedtime as needed for sleep. 30 tablet 2 08/31/2018 at Unknown time  . desipramine (NORPRAMIN) 50 MG tablet 2  qam (Patient taking differently: Take 100 mg by mouth daily. 2  qam) 180 tablet 2 08/31/2018 at Unknown time  . linaclotide (LINZESS) 72 MCG capsule Take 72 mcg by mouth daily before breakfast.   Past Week at Unknown time  . warfarin (COUMADIN) 2 MG tablet Take 2 mg by mouth daily.  0 08/31/2018 at 10am  . zolpidem (AMBIEN CR) 12.5 MG CR tablet Take 1 tablet (12.5 mg total) by mouth at bedtime as needed for sleep. 30 tablet 5 08/31/2018 at Unknown time  . magnesium oxide (MAG-OX) 400 (241.3 Mg) MG tablet Take 0.5 tablets (200 mg total) by mouth 2 (two) times daily. (Patient not taking: Reported on 09/01/2018) 10 tablet 0 Not Taking at Unknown time  . senna-docusate (SENOKOT-S) 8.6-50 MG tablet  Take 1 tablet by mouth at bedtime as needed for mild constipation. (Patient not taking: Reported on 09/01/2018) 10 tablet 0 Not Taking at Unknown time  . warfarin (COUMADIN) 5 MG tablet Take 1 tablet (5 mg total) by mouth daily at 6 PM. Adjust dose based on INR with primary care physician in 5 days (Patient not taking: Reported on 09/01/2018) 30 tablet 0 Not Taking at Unknown time   Scheduled:  . desipramine  100 mg Oral Daily  . Warfarin - Pharmacist Dosing Inpatient   Does not apply q1800   PRN:   Assessment: 42 yoF with PMH lupus, PE on warfarin, TIA, depression, admitted with FUO; possible UTI. Pharmacy to dose warfarin while admitted. Baseline INR 1.7   Prior anticoagulation: warfarin 2 mg daily, LD 10/29  Today, 09/02/2018:  INR subtherapeutic and dropped compared to yesterday - received 3mg  warfarin   Hgb and Plts dropped - monitor  Major drug interactions: broad spectrum abx may increase warfarin sensitivity  No bleeding issues per nursing  Currently NPO  Goal of Therapy: INR 2-3  Plan:  Warfarin 4mg  today at 1800  Daily INR  CBC at least q72 hr while on warfarin  Monitor for signs of bleeding or thrombosis   Adrian Saran, PharmD, BCPS Pager [phone] 09/02/2018 10:30 AM

## 2018-09-03 ENCOUNTER — Other Ambulatory Visit: Payer: Self-pay

## 2018-09-03 ENCOUNTER — Inpatient Hospital Stay (HOSPITAL_COMMUNITY): Payer: Medicare Other

## 2018-09-03 DIAGNOSIS — N179 Acute kidney failure, unspecified: Secondary | ICD-10-CM

## 2018-09-03 DIAGNOSIS — I10 Essential (primary) hypertension: Secondary | ICD-10-CM

## 2018-09-03 LAB — BASIC METABOLIC PANEL
Anion gap: 7 (ref 5–15)
BUN: 11 mg/dL (ref 6–20)
CALCIUM: 7.2 mg/dL — AB (ref 8.9–10.3)
CO2: 20 mmol/L — ABNORMAL LOW (ref 22–32)
CREATININE: 0.96 mg/dL (ref 0.44–1.00)
Chloride: 109 mmol/L (ref 98–111)
GFR calc Af Amer: >60 mL/min (ref >60)
GLUCOSE: 85 mg/dL (ref 70–99)
Potassium: 3.3 mmol/L — ABNORMAL LOW (ref 3.5–5.1)
SODIUM: 136 mmol/L (ref 135–145)

## 2018-09-03 LAB — CBC
HCT: 27.3 % — ABNORMAL LOW (ref 36.0–46.0)
Hemoglobin: 8.1 g/dL — ABNORMAL LOW (ref 12.0–15.0)
MCH: 24.3 pg — AB (ref 26.0–34.0)
MCHC: 29.7 g/dL — ABNORMAL LOW (ref 30.0–36.0)
MCV: 81.7 fL (ref 80.0–100.0)
NRBC: 0 % (ref 0.0–0.2)
PLATELETS: 271 10*3/uL (ref 150–400)
RBC: 3.34 MIL/uL — AB (ref 3.87–5.11)
RDW: 18.4 % — AB (ref 11.5–15.5)
WBC: 10 10*3/uL (ref 4.0–10.5)

## 2018-09-03 LAB — PROTIME-INR
INR: 1.76
PROTHROMBIN TIME: 20.3 s — AB (ref 11.4–15.2)

## 2018-09-03 LAB — HIV ANTIBODY (ROUTINE TESTING W REFLEX): HIV Screen 4th Generation wRfx: NONREACTIVE

## 2018-09-03 MED ORDER — FUROSEMIDE 10 MG/ML IJ SOLN
20.0000 mg | Freq: Once | INTRAMUSCULAR | Status: DC
  Filled 2018-09-03: qty 2

## 2018-09-03 MED ORDER — WARFARIN SODIUM 3 MG PO TABS
3.0000 mg | ORAL_TABLET | Freq: Once | ORAL | Status: AC
  Administered 2018-09-03: 3 mg via ORAL
  Filled 2018-09-03: qty 1

## 2018-09-03 MED ORDER — POTASSIUM CHLORIDE CRYS ER 20 MEQ PO TBCR
40.0000 meq | EXTENDED_RELEASE_TABLET | Freq: Once | ORAL | Status: AC
  Administered 2018-09-03: 40 meq via ORAL
  Filled 2018-09-03: qty 2

## 2018-09-03 NOTE — Progress Notes (Signed)
ANTICOAGULATION CONSULT NOTE - follow up  Pharmacy Consult for warfarin Indication: Hx PE  Allergies  Allergen Reactions  . Codeine Shortness Of Breath  . Contrast Media [Iodinated Diagnostic Agents] Shortness Of Breath  . Tylenol [Acetaminophen] Shortness Of Breath    Patient Measurements: Height: 5\' 8"  (172.7 cm) Weight: 185 lb 6.5 oz (84.1 kg) IBW/kg (Calculated) : 63.9  Vital Signs: Temp: 98.6 F (37 C) (11/01 0400) Temp Source: Oral (11/01 0400) BP: 106/65 (11/01 0733) Pulse Rate: 98 (11/01 0733)  Labs: Recent Labs    09/01/18 1343 09/02/18 0616 09/03/18 0329  HGB 11.4* 9.4* 8.1*  HCT 37.8 30.6* 27.3*  PLT 346 286 271  LABPROT 19.8* 18.3* 20.3*  INR 1.70 1.54 1.76  CREATININE 1.42* 1.05* 0.96    Estimated Creatinine Clearance: 78.8 mL/min (by C-G formula based on SCr of 0.96 mg/dL).   Medical History: Past Medical History:  Diagnosis Date  . Anxiety   . Depression   . Lupus ([place])   . PE (pulmonary embolism)   . PTSD (post-traumatic stress disorder)   . Stroke ([place])   . TIA (transient ischemic attack)     Medications:  Medications Prior to Admission  Medication Sig Dispense Refill Last Dose  . alprazolam (XANAX) 2 MG tablet Take 1 tablet (2 mg total) by mouth at bedtime as needed for sleep. 30 tablet 2 08/31/2018 at Unknown time  . desipramine (NORPRAMIN) 50 MG tablet 2  qam (Patient taking differently: Take 100 mg by mouth daily. 2  qam) 180 tablet 2 08/31/2018 at Unknown time  . linaclotide (LINZESS) 72 MCG capsule Take 72 mcg by mouth daily before breakfast.   Past Week at Unknown time  . warfarin (COUMADIN) 2 MG tablet Take 2 mg by mouth daily.  0 08/31/2018 at 10am  . zolpidem (AMBIEN CR) 12.5 MG CR tablet Take 1 tablet (12.5 mg total) by mouth at bedtime as needed for sleep. 30 tablet 5 08/31/2018 at Unknown time  . magnesium oxide (MAG-OX) 400 (241.3 Mg) MG tablet Take 0.5 tablets (200 mg total) by mouth 2 (two) times daily. (Patient not taking:  Reported on 09/01/2018) 10 tablet 0 Not Taking at Unknown time  . senna-docusate (SENOKOT-S) 8.6-50 MG tablet Take 1 tablet by mouth at bedtime as needed for mild constipation. (Patient not taking: Reported on 09/01/2018) 10 tablet 0 Not Taking at Unknown time  . warfarin (COUMADIN) 5 MG tablet Take 1 tablet (5 mg total) by mouth daily at 6 PM. Adjust dose based on INR with primary care physician in 5 days (Patient not taking: Reported on 09/01/2018) 30 tablet 0 Not Taking at Unknown time   Scheduled:  . desipramine  100 mg Oral Daily  . Warfarin - Pharmacist Dosing Inpatient   Does not apply q1800   PRN:   Assessment: 6 yoF with PMH lupus, PE on warfarin, TIA, depression, admitted with FUO; possible UTI. Pharmacy to dose warfarin while admitted. Baseline INR 1.7   Prior anticoagulation: warfarin 2 mg daily, LD 10/29  Today, 09/03/2018:  INR subtherapeutic at 1.75 but trending up after receiving 4 mg warfarin   Hgb and Plts dropped - monitor  Major drug interactions: broad spectrum abx may increase warfarin sensitivity  No bleeding issues noted   Currently NPO  Goal of Therapy: INR 2-3  Plan:  Warfarin 3 mg today at 1800  Daily INR  CBC at least q72 hr while on warfarin  Monitor for signs of bleeding or thrombosis    Abigail Butts  Zalma Channing, PharmD, BCPS Pager [phone] 09/03/2018 7:48 AM

## 2018-09-03 NOTE — Progress Notes (Signed)
[facility] Infusion Coordinator will follow pt with ID team to support home infusion pharmacy services at [place] if needed.  If patient discharges after hours, please call [phone].   Larry Sierras 09/03/2018, 3:37 PM

## 2018-09-03 NOTE — Progress Notes (Signed)
Patient ID: Lauren Chung, female   DOB: 03/23/1966, 52 y.o.   MRN: [id number]         [facility]  Date of Admission:  09/01/2018   Total days of antibiotics 3        Day 1 vancomycin        Day 1 ceftazidime         ASSESSMENT: She is improving on empiric antibiotic therapy.  Her temperature is trending down.  Urine cultures are growing gram-negative rods suggesting that her recent febrile illness is due to recurrent pyelonephritis.  Blood cultures are negative.  PLAN: 1. Continue ceftazidime pending further observation and final culture results 2. Discontinue vancomycin  Active Problems:   Sepsis ([place])   Fever   Severe recurrent major depression without psychotic features ([place])   History of CVA (cerebrovascular accident)   History of pulmonary embolism   Lupus (HCC)   AKI (acute kidney injury) ([place])   HTN (hypertension)   Normocytic anemia   Scheduled Meds: . desipramine  100 mg Oral Daily  . potassium chloride  40 mEq Oral Once  . warfarin  3 mg Oral ONCE-1800  . Warfarin - Pharmacist Dosing Inpatient   Does not apply q1800   Continuous Infusions: . sodium chloride 125 mL/hr at 09/03/18 0600  . cefTAZidime (FORTAZ)  IV Stopped (09/03/18 0400)  . vancomycin Stopped (09/02/18 1913)   PRN Meds:.alprazolam, fentaNYL (SUBLIMAZE) injection, ibuprofen, ondansetron **OR** ondansetron (ZOFRAN) IV, zolpidem   SUBJECTIVE: She is feeling much better today.  She is not having any more problems with cough, headache, abdominal or back pain.  Review of Systems: Review of Systems  Constitutional: Positive for malaise/fatigue. Negative for chills, diaphoresis and fever.  HENT: Negative for congestion and sore throat.   Respiratory: Negative for cough.   Cardiovascular: Negative for chest pain.  Gastrointestinal: Negative for abdominal pain, diarrhea, nausea and vomiting.  Genitourinary: Negative for dysuria, frequency and urgency.  Musculoskeletal:  Negative for back pain and myalgias.  Skin: Negative for rash.  Neurological: Negative for headaches.    Allergies  Allergen Reactions  . Codeine Shortness Of Breath  . Contrast Media [Iodinated Diagnostic Agents] Shortness Of Breath  . Tylenol [Acetaminophen] Shortness Of Breath    OBJECTIVE: Vitals:   09/03/18 0842 09/03/18 0911 09/03/18 0912 09/03/18 0913  BP:  (!) 87/47 96/68 (!) 82/64  Pulse:   (!) 104 (!) 104  Resp:      Temp: 98.4 F (36.9 C)     TempSrc: Oral     SpO2:      Weight:      Height:       Body mass index is 28.19 kg/m.  Physical Exam  Constitutional: She is oriented to person, place, and time.  She is resting quietly in bed.  She is in good spirits.  HENT:  Mouth/Throat: No oropharyngeal exudate.  Eyes: Conjunctivae are normal.  Neck: Neck supple.  Cardiovascular: Normal rate, regular rhythm and normal heart sounds.  Pulmonary/Chest: Effort normal and breath sounds normal.  Abdominal: There is no tenderness. There is no guarding.  Neurological: She is alert and oriented to person, place, and time.  Skin: No rash noted.  Psychiatric: She has a normal mood and affect.    Lab Results Lab Results  Component Value Date   WBC 10.0 09/03/2018   HGB 8.1 (L) 09/03/2018   HCT 27.3 (L) 09/03/2018   MCV 81.7 09/03/2018   PLT 271 09/03/2018  Lab Results  Component Value Date   CREATININE 0.96 09/03/2018   BUN 11 09/03/2018   NA 136 09/03/2018   K 3.3 (L) 09/03/2018   CL 109 09/03/2018   CO2 20 (L) 09/03/2018    Lab Results  Component Value Date   ALT 19 09/02/2018   AST 28 09/02/2018   ALKPHOS 93 09/02/2018   BILITOT 0.9 09/02/2018     Microbiology: Recent Results (from the past 240 hour(s))  Culture, blood (Routine x 2)     Status: None (Preliminary result)   Collection Time: 09/01/18  1:20 PM  Result Value Ref Range Status   Specimen Description   Final    BLOOD RIGHT ANTECUBITAL Performed at [facility], [place] [address] [place], [place] [address]    Special Requests   Final    Blood Culture results may not be optimal due to an inadequate volume of blood received in culture bottles BOTTLES DRAWN AEROBIC AND ANAEROBIC   Culture   Final    NO GROWTH < 24 HOURS Performed at [facility], [place] [address] [place], [place] [address]    Report Status PENDING  Incomplete  Culture, blood (Routine x 2)     Status: None (Preliminary result)   Collection Time: 09/01/18  1:44 PM  Result Value Ref Range Status   Specimen Description   Final    BLOOD LEFT ANTECUBITAL Performed at [place] [address] [place], [place] [address]    Special Requests   Final    BOTTLES DRAWN AEROBIC AND ANAEROBIC Blood Culture adequate volume   Culture   Final    NO GROWTH < 24 HOURS Performed at [facility], [place] [address], [place], [place] [address]    Report Status PENDING  Incomplete  Urine culture     Status: Abnormal (Preliminary result)   Collection Time: 09/01/18  3:19 PM  Result Value Ref Range Status   Specimen Description   Final    URINE, CLEAN CATCH Performed at [facility], [place] [address], [place], [place] [address]    Special Requests   Final    NONE Performed at [facility], [place] [address] [place], [place] [address]    Culture >=100,000 COLONIES/mL GRAM NEGATIVE RODS (A)  Final   Report Status PENDING  Incomplete  MRSA PCR Screening     Status: None   Collection Time: 09/02/18  5:20 PM  Result Value Ref Range Status   MRSA by PCR NEGATIVE NEGATIVE Final    Comment:        The GeneXpert MRSA Assay (FDA approved for NASAL specimens only), is one component of a comprehensive MRSA colonization surveillance program. It is not intended to diagnose MRSA infection nor to guide or monitor treatment for MRSA infections. Performed at [facility], [place] [address] [place], [place] [address]     Michel Bickers, [place] [facility] [phone] pager   [phone] cell 09/03/2018, 11:06 AM

## 2018-09-03 NOTE — Progress Notes (Signed)
[facility]  PROGRESS NOTE  Lauren Chung SFK:[id number] DOB: 08-10-1966 DOA: 09/01/2018 PCP: Damaris Hippo, MD (Inactive)   Brief HPI:   52 year old female with a history of lupus, pulmonary embolism on chronic anticoagulation on warfarin, stroke, depression came with 1 week history of fever and generalized body aches.  Patient had cough 1 week ago with coughing stopped and patient continues to have fever with generalized body aches.  In the ED patient presented with temperature 102 F, leukocytosis WBC 13,000, tachycardia with heart rate 130s.    Subjective   Patient seen and examined, appreciate ID consult.  Urine culture growing gram-negative rods.  Patient has been afebrile now.  Blood pressure has improved.  She was started on vancomycin and ceftazidime yesterday.   Assessment/Plan:     1. Sepsis-secondary to pyelonephritis, patient presented with fever, hypotension mild tachycardia, leukocytosis.  Initially started on ceftriaxone for possible UTI.  Patient's blood pressure dropped so she was transferred to stepdown unit, started on vancomycin and ceftazidime.  At this time blood pressure has improved.  Patient feels better.  Urine culture growing gram-negative negative rods.  We will continue with current management, follow ID recommendations.  2. Abdominal pain-patient presented with abdominal pain which has resolved at this time, likely from pyelonephritis.   CT abdomen pelvis obtained showed no acute abnormality.   3. Acute kidney injury-patient presented with creatinine of 1.42, it has improved to 0.96 with IV fluids.    4. History of pulmonary embolism-continue Coumadin per pharmacy  5. History of lupus-stable  6. History of depression-continue desipramine, Ambien, Xanax as needed     CBG: No results for input(s): GLUCAP in the last 168 hours.  CBC: Recent Labs  Lab 09/01/18 1343 09/02/18 0616 09/03/18 0329  WBC 13.2* 13.2* 10.0  NEUTROABS 11.3*  --   --    HGB 11.4* 9.4* 8.1*  HCT 37.8 30.6* 27.3*  MCV 80.8 80.1 81.7  PLT 346 286 174    Basic Metabolic Panel: Recent Labs  Lab 09/01/18 1343 09/01/18 2200 09/02/18 0616 09/03/18 0329  NA 129*  --  135 136  K 2.8*  --  4.0 3.3*  CL 91*  --  102 109  CO2 25  --  24 20*  GLUCOSE 111*  --  83 85  BUN 12  --  11 11  CREATININE 1.42*  --  1.05* 0.96  CALCIUM 8.3*  --  7.8* 7.2*  MG  --  1.6*  --   --      DVT prophylaxis: Warfarin  Code Status: Full code  Family Communication: No family at bedside  Disposition Plan: likely home when medically ready for discharge   Consultants:  None  Procedures:  None   Antibiotics:   Anti-infectives (From admission, onward)   Start     Dose/Rate Route Frequency Ordered Stop   09/02/18 1600  cefTRIAXone (ROCEPHIN) 1 g in sodium chloride 0.9 % 100 mL IVPB  Status:  Discontinued     1 g 200 mL/hr over 30 Minutes Intravenous Every 24 hours 09/01/18 2037 09/02/18 1529   09/02/18 1600  vancomycin (VANCOCIN) 1,500 mg in sodium chloride 0.9 % 500 mL IVPB     1,500 mg 250 mL/hr over 120 Minutes Intravenous Every 24 hours 09/02/18 1538     09/02/18 1600  cefTAZidime (FORTAZ) 1 g in sodium chloride 0.9 % 100 mL IVPB     1 g 200 mL/hr over 30 Minutes Intravenous Every 8 hours 09/02/18 1539  09/01/18 1645  cefTRIAXone (ROCEPHIN) 1 g in sodium chloride 0.9 % 100 mL IVPB     1 g 200 mL/hr over 30 Minutes Intravenous  Once 09/01/18 1635 09/01/18 1733       Objective   Vitals:   09/03/18 0630 09/03/18 0651 09/03/18 0733 09/03/18 0842  BP: (!) 84/51 (!) 96/52 106/65   Pulse: 87 90 98   Resp: 20 (!) 21 18   Temp:    98.4 F (36.9 C)  TempSrc:    Oral  SpO2: 96% 96% 100%   Weight:      Height:        Intake/Output Summary (Last 24 hours) at 09/03/2018 0848 Last data filed at 09/03/2018 0600 Gross per 24 hour  Intake 3756.01 ml  Output -  Net 3756.01 ml   Filed Weights   09/02/18 1650  Weight: 84.1 kg     Physical  Examination:    Neck: Supple, no deformities, masses, or tenderness Lungs: Normal respiratory effort, bilateral clear to auscultation, no crackles or wheezes.  Heart: Regular rate and rhythm, S1 and S2 normal, no murmurs, rubs auscultated Abdomen: BS normoactive,soft,nondistended,non-tender to palpation,no organomegaly Extremities: No pretibial edema, no erythema, no cyanosis, no clubbing Neuro : Alert and oriented to time, place and person, No focal deficits  Skin: No rashes seen on exam     Data Reviewed: I have personally reviewed following labs and imaging studies   Recent Results (from the past 240 hour(s))  Culture, blood (Routine x 2)     Status: None (Preliminary result)   Collection Time: 09/01/18  1:20 PM  Result Value Ref Range Status   Specimen Description   Final    BLOOD RIGHT ANTECUBITAL Performed at [place] [address], [place], [place] [address]    Special Requests   Final    Blood Culture results may not be optimal due to an inadequate volume of blood received in culture bottles BOTTLES DRAWN AEROBIC AND ANAEROBIC   Culture   Final    NO GROWTH < 24 HOURS Performed at [facility], [place] [address] [place], [place] [address]    Report Status PENDING  Incomplete  Culture, blood (Routine x 2)     Status: None (Preliminary result)   Collection Time: 09/01/18  1:44 PM  Result Value Ref Range Status   Specimen Description   Final    BLOOD LEFT ANTECUBITAL Performed at [place] [address] [place], [place] [address]    Special Requests   Final    BOTTLES DRAWN AEROBIC AND ANAEROBIC Blood Culture adequate volume   Culture   Final    NO GROWTH < 24 HOURS Performed at [facility], [place] [address], [place], [place] [address]    Report Status PENDING  Incomplete  Urine culture     Status: Abnormal (Preliminary result)   Collection Time: 09/01/18  3:19 PM  Result Value Ref Range Status    Specimen Description   Final    URINE, CLEAN CATCH Performed at [facility], [place] [address], [place], [place] [address]    Special Requests   Final    NONE Performed at [facility], [place] [address] [place], [place] [address]    Culture >=100,000 COLONIES/mL GRAM NEGATIVE RODS (A)  Final   Report Status PENDING  Incomplete  MRSA PCR Screening     Status: None   Collection Time: 09/02/18  5:20 PM  Result Value Ref Range Status   MRSA by PCR NEGATIVE NEGATIVE Final    Comment:        The GeneXpert MRSA Assay (FDA approved for NASAL specimens only), is one component of a comprehensive MRSA colonization surveillance program. It is not intended to diagnose MRSA infection nor to guide or monitor treatment for MRSA infections. Performed at [facility], [place] [address] [place], [place] [address]      Liver Function Tests: Recent Labs  Lab 09/01/18 1343 09/02/18 0616  AST 36 28  ALT 26 19  ALKPHOS 111 93  BILITOT 1.1 0.9  PROT 8.1 6.8  ALBUMIN 3.6 2.6*   Recent Labs  Lab 09/01/18 2200  LIPASE 22       Studies: Ct Abdomen Pelvis Wo Contrast  Result Date: 09/01/2018 CLINICAL DATA:  Fever, leukocytosis.  Abdominal pain. EXAM: CT ABDOMEN AND PELVIS WITHOUT CONTRAST TECHNIQUE: Multidetector CT imaging of the abdomen and pelvis was performed following the standard protocol without IV contrast. COMPARISON:  CT scan of November 19, 2017. FINDINGS: Lower chest: No acute abnormality. Hepatobiliary: No gallstones or biliary dilatation is noted. Stable left hepatic cyst is noted. Pancreas: Unremarkable. No pancreatic ductal dilatation or surrounding inflammatory changes. Spleen: Normal in size without focal abnormality. Adrenals/Urinary Tract: Adrenal glands are unremarkable. Kidneys are normal, without renal calculi, focal lesion, or hydronephrosis. Bladder is unremarkable. Stomach/Bowel: Stomach is within normal limits.  Appendix appears normal. No evidence of bowel wall thickening, distention, or inflammatory changes. Vascular/Lymphatic: No significant vascular findings are present. No enlarged abdominal or pelvic lymph nodes. Reproductive: Uterus and bilateral adnexa are unremarkable. Other: No abdominal wall hernia or abnormality. No abdominopelvic ascites. Musculoskeletal: No acute or significant osseous findings. IMPRESSION: No acute abnormality seen in the abdomen or pelvis. Electronically Signed   By: Marijo Conception, M.D.   On: 09/01/2018 20:55   Dg Chest 2 View  Result Date: 09/01/2018 CLINICAL DATA:  Cough and tachycardia EXAM: CHEST - 2 VIEW COMPARISON:  11/19/2017 FINDINGS: The heart size and mediastinal contours are within normal limits. Both lungs are clear. The visualized skeletal structures are unremarkable. IMPRESSION: Clear lungs. Electronically Signed   By: Ulyses Jarred M.D.   On: 09/01/2018 14:06   US Abdomen Limited Ruq  Result Date: 09/01/2018 CLINICAL DATA:  Upper abdominal pain EXAM: ULTRASOUND ABDOMEN LIMITED RIGHT UPPER QUADRANT COMPARISON:  CT abdomen/pelvis 11/19/2017 FINDINGS: Gallbladder: No gallstones or wall thickening visualized. No sonographic Murphy sign noted by sonographer. Common bile duct: Diameter: 4.1 mm Liver: Normal hepatic parenchymal echogenicity. 2 x 1.9 x 2.4 cm anechoic left hepatic mass most consistent with a cyst. Portal vein is patent on color Doppler imaging with normal direction of blood flow towards the liver. IMPRESSION: 1. No cholelithiasis or sonographic evidence of acute cholecystitis. Mild increased hepatic echogenicity as can be seen with hepatic steatosis. Electronically Signed   By: Kathreen Devoid   On: 09/01/2018 16:30    Scheduled Meds: . desipramine  100 mg Oral Daily  . Warfarin - Pharmacist Dosing Inpatient   Does not apply q1800    Admission status: Inpatient: Based on patients clinical presentation and evaluation of above clinical data, I have made  determination that patient meets Inpatient criteria at this time.  Patient came with fever/SIRS, started on IV antibiotics, ID consulted.    Time spent: 30 min  [facility] Pager [phone]. If 7PM-7AM, please contact night-coverage at [email or web address], Office  [place]  09/03/2018, 8:48 AM  LOS: 1 day

## 2018-09-03 NOTE — Care Management Note (Signed)
Case Management Note  Patient Details  Name: Lauren Chung MRN: [id number] Date of Birth: Oct 06, 1966  Subjective/Objective:                  Pyelonephritis, urine culture growing GNR,Iv fortaz and iv Vancomysin PMH=lupus and pulm. emboli  Action/Plan: Following for cm needs  Expected Discharge Date:  (unknown)               Expected Discharge Plan:  Home/Self Care  In-House Referral:     Discharge planning Services  CM Consult  Post Acute Care Choice:    Choice offered to:     DME Arranged:    DME Agency:     HH Arranged:    [place] Agency:     Status of Service:  In process, will continue to follow  If discussed at Long Length of Stay Meetings, dates discussed:    Additional Comments:  Leeroy Cha, RN 09/03/2018, 10:03 AM

## 2018-09-04 DIAGNOSIS — E876 Hypokalemia: Secondary | ICD-10-CM

## 2018-09-04 DIAGNOSIS — R8271 Bacteriuria: Secondary | ICD-10-CM

## 2018-09-04 DIAGNOSIS — R5383 Other fatigue: Secondary | ICD-10-CM

## 2018-09-04 DIAGNOSIS — R5381 Other malaise: Secondary | ICD-10-CM

## 2018-09-04 LAB — CBC
HCT: 28.5 % — ABNORMAL LOW (ref 36.0–46.0)
Hemoglobin: 8.6 g/dL — ABNORMAL LOW (ref 12.0–15.0)
MCH: 24.6 pg — AB (ref 26.0–34.0)
MCHC: 30.2 g/dL (ref 30.0–36.0)
MCV: 81.4 fL (ref 80.0–100.0)
NRBC: 0 % (ref 0.0–0.2)
PLATELETS: 309 10*3/uL (ref 150–400)
RBC: 3.5 MIL/uL — ABNORMAL LOW (ref 3.87–5.11)
RDW: 18.8 % — ABNORMAL HIGH (ref 11.5–15.5)
WBC: 8.9 10*3/uL (ref 4.0–10.5)

## 2018-09-04 LAB — URINE CULTURE: Culture: >=100000 — AB

## 2018-09-04 LAB — BASIC METABOLIC PANEL
ANION GAP: 9 (ref 5–15)
BUN: 10 mg/dL (ref 6–20)
CALCIUM: 7.5 mg/dL — AB (ref 8.9–10.3)
CO2: 23 mmol/L (ref 22–32)
CREATININE: 1.04 mg/dL — AB (ref 0.44–1.00)
Chloride: 104 mmol/L (ref 98–111)
Glucose, Bld: 83 mg/dL (ref 70–99)
Potassium: 3 mmol/L — ABNORMAL LOW (ref 3.5–5.1)
Sodium: 136 mmol/L (ref 135–145)

## 2018-09-04 LAB — PROTIME-INR
INR: 2.12
PROTHROMBIN TIME: 23.5 s — AB (ref 11.4–15.2)

## 2018-09-04 MED ORDER — WARFARIN SODIUM 1 MG PO TABS
1.0000 mg | ORAL_TABLET | Freq: Once | ORAL | Status: AC
  Administered 2018-09-04: 1 mg via ORAL
  Filled 2018-09-04: qty 1

## 2018-09-04 MED ORDER — POTASSIUM CHLORIDE 10 MEQ/100ML IV SOLN
10.0000 meq | INTRAVENOUS | Status: DC
  Administered 2018-09-04 (×2): 10 meq via INTRAVENOUS
  Filled 2018-09-04 (×3): qty 100

## 2018-09-04 MED ORDER — WARFARIN SODIUM 2 MG PO TABS
2.0000 mg | ORAL_TABLET | Freq: Once | ORAL | Status: DC
  Filled 2018-09-04: qty 1

## 2018-09-04 MED ORDER — POTASSIUM CHLORIDE CRYS ER 20 MEQ PO TBCR
40.0000 meq | EXTENDED_RELEASE_TABLET | Freq: Once | ORAL | Status: AC
  Administered 2018-09-04: 40 meq via ORAL
  Filled 2018-09-04: qty 2

## 2018-09-04 MED ORDER — FUROSEMIDE 10 MG/ML IJ SOLN
20.0000 mg | Freq: Once | INTRAMUSCULAR | Status: AC
  Administered 2018-09-04: 20 mg via INTRAVENOUS

## 2018-09-04 MED ORDER — SULFAMETHOXAZOLE-TRIMETHOPRIM 800-160 MG PO TABS
1.0000 | ORAL_TABLET | Freq: Two times a day (BID) | ORAL | Status: DC
  Administered 2018-09-04 – 2018-09-05 (×3): 1 via ORAL
  Filled 2018-09-04 (×3): qty 1

## 2018-09-04 MED ORDER — POTASSIUM CHLORIDE CRYS ER 20 MEQ PO TBCR
40.0000 meq | EXTENDED_RELEASE_TABLET | Freq: Two times a day (BID) | ORAL | Status: AC
  Administered 2018-09-04 (×2): 40 meq via ORAL
  Filled 2018-09-04 (×2): qty 2

## 2018-09-04 NOTE — Progress Notes (Signed)
Pt states IV potassium is burning her arm too badly to continue it. IV site is not red or swollen, IV flushes well with good blood return. Stopped potassium and new order received for PO potassium

## 2018-09-04 NOTE — Progress Notes (Addendum)
ANTICOAGULATION CONSULT NOTE - follow up  Pharmacy Consult for warfarin Indication: Hx PE  Allergies  Allergen Reactions  . Codeine Shortness Of Breath  . Contrast Media [Iodinated Diagnostic Agents] Shortness Of Breath  . Tylenol [Acetaminophen] Shortness Of Breath    Patient Measurements: Height: 5\' 8"  (172.7 cm) Weight: 185 lb 6.5 oz (84.1 kg) IBW/kg (Calculated) : 63.9  Vital Signs: Temp: 98.8 F (37.1 C) (11/02 0348) Temp Source: Oral (11/02 0348) BP: 120/56 (11/02 0600) Pulse Rate: 95 (11/02 0600)  Labs: Recent Labs    09/02/18 0616 09/03/18 0329 09/04/18 0240  HGB 9.4* 8.1* 8.6*  HCT 30.6* 27.3* 28.5*  PLT 286 271 309  LABPROT 18.3* 20.3* 23.5*  INR 1.54 1.76 2.12  CREATININE 1.05* 0.96 1.04*    Estimated Creatinine Clearance: 72.7 mL/min (A) (by C-G formula based on SCr of 1.04 mg/dL (H)).   Medical History: Past Medical History:  Diagnosis Date  . Anxiety   . Depression   . Lupus ([place])   . PE (pulmonary embolism)   . PTSD (post-traumatic stress disorder)   . Stroke ([place])   . TIA (transient ischemic attack)     Medications:  Medications Prior to Admission  Medication Sig Dispense Refill Last Dose  . alprazolam (XANAX) 2 MG tablet Take 1 tablet (2 mg total) by mouth at bedtime as needed for sleep. 30 tablet 2 08/31/2018 at Unknown time  . desipramine (NORPRAMIN) 50 MG tablet 2  qam (Patient taking differently: Take 100 mg by mouth daily. 2  qam) 180 tablet 2 08/31/2018 at Unknown time  . linaclotide (LINZESS) 72 MCG capsule Take 72 mcg by mouth daily before breakfast.   Past Week at Unknown time  . warfarin (COUMADIN) 2 MG tablet Take 2 mg by mouth daily.  0 08/31/2018 at 10am  . zolpidem (AMBIEN CR) 12.5 MG CR tablet Take 1 tablet (12.5 mg total) by mouth at bedtime as needed for sleep. 30 tablet 5 08/31/2018 at Unknown time  . magnesium oxide (MAG-OX) 400 (241.3 Mg) MG tablet Take 0.5 tablets (200 mg total) by mouth 2 (two) times daily. (Patient  not taking: Reported on 09/01/2018) 10 tablet 0 Not Taking at Unknown time  . senna-docusate (SENOKOT-S) 8.6-50 MG tablet Take 1 tablet by mouth at bedtime as needed for mild constipation. (Patient not taking: Reported on 09/01/2018) 10 tablet 0 Not Taking at Unknown time  . warfarin (COUMADIN) 5 MG tablet Take 1 tablet (5 mg total) by mouth daily at 6 PM. Adjust dose based on INR with primary care physician in 5 days (Patient not taking: Reported on 09/01/2018) 30 tablet 0 Not Taking at Unknown time   Scheduled:  . desipramine  100 mg Oral Daily  . furosemide  20 mg Intravenous Once  . potassium chloride  40 mEq Oral Once  . Warfarin - Pharmacist Dosing Inpatient   Does not apply q1800   PRN:   Assessment: 2 yoF with PMH lupus, PE on warfarin, TIA, depression, admitted with FUO; possible UTI. Pharmacy to dose warfarin while admitted. Baseline INR 1.7   Prior anticoagulation: warfarin 2 mg daily, LD 10/29  Today, 09/04/2018:  INR therapeutic at 2.12, trending up, received  3 mg warfarin yesterday   Hgb low but stable, Plts WNL - monitor  Major drug interactions: broad spectrum abx have been changed to Bactrim  No bleeding issues noted    Goal of Therapy: INR 2-3  Plan:  In light on new start BACTRIM, Warfarin 1 mg today  at 1800  Daily INR  CBC at least q72 hr while on warfarin  Monitor for signs of bleeding or thrombosis    Royetta Asal, PharmD, BCPS Pager [phone] 09/04/2018 7:11 AM

## 2018-09-04 NOTE — Progress Notes (Signed)
Patient ID: Lauren Chung, female   DOB: [birth year], 52 y.o.   MRN: [id number]         [facility] for Infectious Disease  Date of Admission:  09/01/2018   Total days of antibiotics 4        Day 2 ceftazidime         ASSESSMENT: She is improving and her temperatures are trending down.  Her urine culture has grown E. coli suggesting that her recent fevers are probably due to recurrent pyelonephritis.  I will change her to oral trimethoprim sulfamethoxazole and plan on 6 more days of therapy.  PLAN: 1. Change ceftazidime to oral trimethoprim sulfamethoxazole  Active Problems:   Sepsis (HCC)   Fever   Severe recurrent major depression without psychotic features ([place])   History of CVA (cerebrovascular accident)   History of pulmonary embolism   Lupus (HCC)   AKI (acute kidney injury) ([place])   HTN (hypertension)   Normocytic anemia   Scheduled Meds: . desipramine  100 mg Oral Daily  . furosemide  20 mg Intravenous Once  . warfarin  2 mg Oral ONCE-1800  . Warfarin - Pharmacist Dosing Inpatient   Does not apply q1800   Continuous Infusions: . sodium chloride Stopped (09/04/18 0956)  . cefTAZidime (FORTAZ)  IV Stopped (09/04/18 0513)  . potassium chloride Stopped (09/04/18 0956)   PRN Meds:.alprazolam, fentaNYL (SUBLIMAZE) injection, ibuprofen, ondansetron **OR** ondansetron (ZOFRAN) IV, zolpidem   SUBJECTIVE: She felt like she had some fever overnight but overall she is feeling much better.  Her cough, headache, back or abdominal pain.  Review of Systems: Review of Systems  Constitutional: Positive for fever and malaise/fatigue. Negative for chills and diaphoresis.  HENT: Negative for congestion and sore throat.   Respiratory: Negative for cough.   Cardiovascular: Negative for chest pain.  Gastrointestinal: Negative for abdominal pain, diarrhea, nausea and vomiting.  Genitourinary: Negative for dysuria, frequency and urgency.  Musculoskeletal: Negative for back pain  and myalgias.  Skin: Negative for rash.  Neurological: Negative for headaches.    Allergies  Allergen Reactions  . Codeine Shortness Of Breath  . Contrast Media [Iodinated Diagnostic Agents] Shortness Of Breath  . Tylenol [Acetaminophen] Shortness Of Breath    OBJECTIVE: Vitals:   09/04/18 0600 09/04/18 0700 09/04/18 0800 09/04/18 0816  BP: (!) 120/56 (!) 143/104 (!) 148/86   Pulse: 95 (!) 111 (!) 115   Resp: (!) 31 (!) 23 14   Temp:    99.2 F (37.3 C)  TempSrc:    Oral  SpO2: 98% 100% 100%   Weight:      Height:       Body mass index is 28.19 kg/m.  Physical Exam  Constitutional: She is oriented to person, place, and time.  She is sitting up in bed eating cold pancakes and eggs.  She is in very good spirits.  She is watching television.  HENT:  Mouth/Throat: No oropharyngeal exudate.  Eyes: Conjunctivae are normal.  Neck: Neck supple.  Cardiovascular: Normal rate, regular rhythm and normal heart sounds.  Pulmonary/Chest: Effort normal and breath sounds normal.  Abdominal: There is no tenderness. There is no guarding.  She has no CVA tenderness.  Neurological: She is alert and oriented to person, place, and time.  Skin: No rash noted.  Psychiatric: She has a normal mood and affect.    Lab Results Lab Results  Component Value Date   WBC 8.9 09/04/2018   HGB 8.6 (L) 09/04/2018   HCT 28.5 (L)  09/04/2018   MCV 81.4 09/04/2018   PLT 309 09/04/2018    Lab Results  Component Value Date   CREATININE 1.04 (H) 09/04/2018   BUN 10 09/04/2018   NA 136 09/04/2018   K 3.0 (L) 09/04/2018   CL 104 09/04/2018   CO2 23 09/04/2018    Lab Results  Component Value Date   ALT 19 09/02/2018   AST 28 09/02/2018   ALKPHOS 93 09/02/2018   BILITOT 0.9 09/02/2018     Microbiology: Recent Results (from the past 240 hour(s))  Culture, blood (Routine x 2)     Status: None (Preliminary result)   Collection Time: 09/01/18  1:20 PM  Result Value Ref Range Status   Specimen  Description   Final    BLOOD RIGHT ANTECUBITAL Performed at [facility], [place] [address] [place], [place] [address]    Special Requests   Final    Blood Culture results may not be optimal due to an inadequate volume of blood received in culture bottles BOTTLES DRAWN AEROBIC AND ANAEROBIC   Culture   Final    NO GROWTH 3 DAYS Performed at [facility], [place] [address], [place], [place] [address]    Report Status PENDING  Incomplete  Culture, blood (Routine x 2)     Status: None (Preliminary result)   Collection Time: 09/01/18  1:44 PM  Result Value Ref Range Status   Specimen Description   Final    BLOOD LEFT ANTECUBITAL Performed at [place] [address], [place], [place] [address]    Special Requests   Final    BOTTLES DRAWN AEROBIC AND ANAEROBIC Blood Culture adequate volume   Culture   Final    NO GROWTH 3 DAYS Performed at [facility], [place] [address] [place], [place] [address]    Report Status PENDING  Incomplete  Urine culture     Status: Abnormal   Collection Time: 09/01/18  3:19 PM  Result Value Ref Range Status   Specimen Description   Final    URINE, CLEAN CATCH Performed at [facility], [place] [address] [place], [place] [address]    Special Requests   Final    NONE Performed at [facility], [place] [address] [place], [place] [address]    Culture >=100,000 COLONIES/mL ESCHERICHIA COLI (A)  Final   Report Status 09/04/2018 FINAL  Final   Organism ID, Bacteria ESCHERICHIA COLI (A)  Final      Susceptibility   Escherichia coli - MIC*    AMPICILLIN <=2 SENSITIVE Sensitive     CEFAZOLIN <=4 SENSITIVE Sensitive     CEFTRIAXONE <=1 SENSITIVE Sensitive     CIPROFLOXACIN <=0.25 SENSITIVE Sensitive     GENTAMICIN <=1 SENSITIVE Sensitive     IMIPENEM <=0.25 SENSITIVE Sensitive     NITROFURANTOIN <=16 SENSITIVE Sensitive     TRIMETH/SULFA <=20 SENSITIVE Sensitive      AMPICILLIN/SULBACTAM <=2 SENSITIVE Sensitive     PIP/TAZO <=4 SENSITIVE Sensitive     Extended ESBL NEGATIVE Sensitive     * >=100,000 COLONIES/mL ESCHERICHIA COLI  MRSA PCR Screening     Status: None   Collection Time: 09/02/18  5:20 PM  Result Value Ref Range Status   MRSA by PCR NEGATIVE NEGATIVE Final    Comment:        The GeneXpert MRSA Assay (FDA approved for NASAL specimens only), is one component of a comprehensive MRSA colonization surveillance program. It  is not intended to diagnose MRSA infection nor to guide or monitor treatment for MRSA infections. Performed at [facility], [place] [address], [place], [place] [address]     Michel Bickers, [place] for [place] [phone] pager   [phone] cell 09/04/2018, 11:52 AM

## 2018-09-04 NOTE — Progress Notes (Signed)
[facility]  PROGRESS NOTE  Lauren Chung YTK:[id number] DOB: 11-03-1966 DOA: 09/01/2018 PCP: Damaris Hippo, MD (Inactive)   Brief HPI:   52 year old female with a history of lupus, pulmonary embolism on chronic anticoagulation on warfarin, stroke, depression came with 1 week history of fever and generalized body aches.  Patient had cough 1 week ago with coughing stopped and patient continues to have fever with generalized body aches.  In the ED patient presented with temperature 102 F, leukocytosis WBC 13,000, tachycardia with heart rate 130s.    Subjective   Patient seen and examined, became short of breath yesterday.  IV Lasix was ordered but not given due to low blood pressure.  This morning also patient complains of mild dyspnea.  Not requiring oxygen.  Chest x-ray showed pulmonary venous congestion.   Assessment/Plan:     1. Sepsis-secondary to pyelonephritis, resolved, patient patient presented with fever, hypotension mild tachycardia, leukocytosis.  Initially started on ceftriaxone for possible UTI.  Patient's blood pressure dropped so she was transferred to stepdown unit, started on vancomycin and ceftazidime.  At this time blood pressure has improved.  Patient feels better.  Urine culture growing E. coli which is pansensitive.  ID was consulted and have change antibiotics from IV ceftazidime to p.o. Bactrim for 6 more days 1.  2. Abdominal pain-patient presented with abdominal pain which has resolved at this time, likely from pyelonephritis.   CT abdomen pelvis obtained showed no acute abnormality.   3. Acute kidney injury-patient presented with creatinine of 1.42, it has improved to 0.96 with IV fluids.    4. History of pulmonary embolism-continue Coumadin per pharmacy  5. History of lupus-stable  6. History of depression-continue desipramine, Ambien, Xanax as needed  7. Hypokalemia-replace potassium and check BMP in am.     CBG: No results for input(s): GLUCAP  in the last 168 hours.  CBC: Recent Labs  Lab 09/01/18 1343 09/02/18 0616 09/03/18 0329 09/04/18 0240  WBC 13.2* 13.2* 10.0 8.9  NEUTROABS 11.3*  --   --   --   HGB 11.4* 9.4* 8.1* 8.6*  HCT 37.8 30.6* 27.3* 28.5*  MCV 80.8 80.1 81.7 81.4  PLT 346 286 271 751    Basic Metabolic Panel: Recent Labs  Lab 09/01/18 1343 09/01/18 2200 09/02/18 0616 09/03/18 0329 09/04/18 0240  NA 129*  --  135 136 136  K 2.8*  --  4.0 3.3* 3.0*  CL 91*  --  102 109 104  CO2 25  --  24 20* 23  GLUCOSE 111*  --  83 85 83  BUN 12  --  11 11 10   CREATININE 1.42*  --  1.05* 0.96 1.04*  CALCIUM 8.3*  --  7.8* 7.2* 7.5*  MG  --  1.6*  --   --   --      DVT prophylaxis: Warfarin  Code Status: Full code  Family Communication: No family at bedside  Disposition Plan: likely home when medically ready for discharge   Consultants:  None  Procedures:  None   Antibiotics:   Anti-infectives (From admission, onward)   Start     Dose/Rate Route Frequency Ordered Stop   09/04/18 1200  sulfamethoxazole-trimethoprim (BACTRIM DS,SEPTRA DS) 800-160 MG per tablet 1 tablet     1 tablet Oral Every 12 hours 09/04/18 1158     09/02/18 1600  cefTRIAXone (ROCEPHIN) 1 g in sodium chloride 0.9 % 100 mL IVPB  Status:  Discontinued     1 g 200 mL/hr  over 30 Minutes Intravenous Every 24 hours 09/01/18 2037 09/02/18 1529   09/02/18 1600  vancomycin (VANCOCIN) 1,500 mg in sodium chloride 0.9 % 500 mL IVPB  Status:  Discontinued     1,500 mg 250 mL/hr over 120 Minutes Intravenous Every 24 hours 09/02/18 1538 09/03/18 1110   09/02/18 1600  cefTAZidime (FORTAZ) 1 g in sodium chloride 0.9 % 100 mL IVPB  Status:  Discontinued     1 g 200 mL/hr over 30 Minutes Intravenous Every 8 hours 09/02/18 1539 09/04/18 1158   09/01/18 1645  cefTRIAXone (ROCEPHIN) 1 g in sodium chloride 0.9 % 100 mL IVPB     1 g 200 mL/hr over 30 Minutes Intravenous  Once 09/01/18 1635 09/01/18 1733       Objective   Vitals:    09/04/18 0700 09/04/18 0800 09/04/18 0816 09/04/18 1232  BP: (!) 143/104 (!) 148/86  (!) 108/57  Pulse: (!) 111 (!) 115  (!) 122  Resp: (!) 23 14  (!) 21  Temp:   99.2 F (37.3 C)   TempSrc:   Oral   SpO2: 100% 100%  92%  Weight:      Height:        Intake/Output Summary (Last 24 hours) at 09/04/2018 1342 Last data filed at 09/04/2018 1300 Gross per 24 hour  Intake 719.6 ml  Output 600 ml  Net 119.6 ml   Filed Weights   09/02/18 1650  Weight: 84.1 kg     Physical Examination:     Mouth: Oral mucosa is moist, no lesions on palate,  Neck: Supple, no deformities, masses, or tenderness Lungs: Normal respiratory effort, bilateral clear to auscultation, no crackles or wheezes.  Heart: Regular rate and rhythm, S1 and S2 normal, no murmurs, rubs auscultated Abdomen: BS normoactive,soft,nondistended,non-tender to palpation,no organomegaly Extremities: No pretibial edema, no erythema, no cyanosis, no clubbing Neuro : Alert and oriented to time, place and person, No focal deficits   Data Reviewed: I have personally reviewed following labs and imaging studies   Recent Results (from the past 240 hour(s))  Culture, blood (Routine x 2)     Status: None (Preliminary result)   Collection Time: 09/01/18  1:20 PM  Result Value Ref Range Status   Specimen Description   Final    BLOOD RIGHT ANTECUBITAL Performed at [place] [address] [place], [place] [address]    Special Requests   Final    Blood Culture results may not be optimal due to an inadequate volume of blood received in culture bottles BOTTLES DRAWN AEROBIC AND ANAEROBIC   Culture   Final    NO GROWTH 3 DAYS Performed at [facility], [place] [address] [place], [place] [address]    Report Status PENDING  Incomplete  Culture, blood (Routine x 2)     Status: None (Preliminary result)   Collection Time: 09/01/18  1:44 PM  Result Value Ref Range Status   Specimen Description   Final    BLOOD  LEFT ANTECUBITAL Performed at [place] [address], [place], [place] [address]    Special Requests   Final    BOTTLES DRAWN AEROBIC AND ANAEROBIC Blood Culture adequate volume   Culture   Final    NO GROWTH 3 DAYS Performed at [facility], [place] [address], [place], [place] [address]    Report Status PENDING  Incomplete  Urine culture     Status: Abnormal   Collection Time: 09/01/18  3:19 PM  Result Value Ref Range Status   Specimen Description   Final    URINE, CLEAN CATCH Performed at [facility], [place] [address], [place], [place] [address]    Special Requests   Final    NONE Performed at [facility], [place] [address] [place], [place] [address]    Culture >=100,000 COLONIES/mL ESCHERICHIA COLI (A)  Final   Report Status 09/04/2018 FINAL  Final   Organism ID, Bacteria ESCHERICHIA COLI (A)  Final      Susceptibility   Escherichia coli - MIC*    AMPICILLIN <=2 SENSITIVE Sensitive     CEFAZOLIN <=4 SENSITIVE Sensitive     CEFTRIAXONE <=1 SENSITIVE Sensitive     CIPROFLOXACIN <=0.25 SENSITIVE Sensitive     GENTAMICIN <=1 SENSITIVE Sensitive     IMIPENEM <=0.25 SENSITIVE Sensitive     NITROFURANTOIN <=16 SENSITIVE Sensitive     TRIMETH/SULFA <=20 SENSITIVE Sensitive     AMPICILLIN/SULBACTAM <=2 SENSITIVE Sensitive     PIP/TAZO <=4 SENSITIVE Sensitive     Extended ESBL NEGATIVE Sensitive     * >=100,000 COLONIES/mL ESCHERICHIA COLI  MRSA PCR Screening     Status: None   Collection Time: 09/02/18  5:20 PM  Result Value Ref Range Status   MRSA by PCR NEGATIVE NEGATIVE Final    Comment:        The GeneXpert MRSA Assay (FDA approved for NASAL specimens only), is one component of a comprehensive MRSA colonization surveillance program. It is not intended to diagnose MRSA infection nor to guide or monitor treatment for MRSA infections. Performed at [facility], [place] [address],  [place], [place] [address]      Liver Function Tests: Recent Labs  Lab 09/01/18 1343 09/02/18 0616  AST 36 28  ALT 26 19  ALKPHOS 111 93  BILITOT 1.1 0.9  PROT 8.1 6.8  ALBUMIN 3.6 2.6*   Recent Labs  Lab 09/01/18 2200  LIPASE 22       Studies: Dg Chest Port 1v Same Day  Result Date: 09/03/2018 CLINICAL DATA:  Sepsis secondary to pyelonephritis. EXAM: PORTABLE CHEST 1 VIEW COMPARISON:  09/01/2018 FINDINGS: Normal heart size and mediastinal contours. Mild pulmonary vascular congestion. Slightly more confluent pulmonary opacities are noted at the right lung base that may represent superimposed pneumonia although more likely findings related to edema. No effusion or pneumothorax. IMPRESSION: Pulmonary vascular congestion with slightly more confluent opacities at the right lung base suspicious for pneumonia though findings are more likely related to pulmonary edema. Electronically Signed   By: Ashley Royalty M.D.   On: 09/03/2018 17:28    Scheduled Meds: . desipramine  100 mg Oral Daily  . furosemide  20 mg Intravenous Once  . potassium chloride  40 mEq Oral BID  . sulfamethoxazole-trimethoprim  1 tablet Oral Q12H  . warfarin  1 mg Oral ONCE-1800  . Warfarin - Pharmacist Dosing Inpatient   Does not apply q1800    Admission status: Inpatient: Based on patients clinical presentation and evaluation of above clinical data, I have made determination that patient meets Inpatient criteria at this time.  Patient came with fever/SIRS, started on IV antibiotics, ID consulted.    Time spent: 30 min  [facility] Pager [phone]. If 7PM-7AM, please contact night-coverage at [email or web address], Office  [phone]  password [email or web address]  09/04/2018, 1:42 PM  LOS: 2 days

## 2018-09-05 DIAGNOSIS — Z7901 Long term (current) use of anticoagulants: Secondary | ICD-10-CM

## 2018-09-05 DIAGNOSIS — Z86718 Personal history of other venous thrombosis and embolism: Secondary | ICD-10-CM

## 2018-09-05 LAB — BASIC METABOLIC PANEL
Anion gap: 9 (ref 5–15)
BUN: 9 mg/dL (ref 6–20)
CHLORIDE: 104 mmol/L (ref 98–111)
CO2: 24 mmol/L (ref 22–32)
Calcium: 7.5 mg/dL — ABNORMAL LOW (ref 8.9–10.3)
Creatinine, Ser: 0.98 mg/dL (ref 0.44–1.00)
GFR calc Af Amer: >60 mL/min (ref >60)
GFR calc non Af Amer: >60 mL/min (ref >60)
Glucose, Bld: 75 mg/dL (ref 70–99)
POTASSIUM: 2.9 mmol/L — AB (ref 3.5–5.1)
SODIUM: 137 mmol/L (ref 135–145)

## 2018-09-05 LAB — CBC
HEMATOCRIT: 28.1 % — AB (ref 36.0–46.0)
HEMOGLOBIN: 8.6 g/dL — AB (ref 12.0–15.0)
MCH: 24.4 pg — ABNORMAL LOW (ref 26.0–34.0)
MCHC: 30.6 g/dL (ref 30.0–36.0)
MCV: 79.6 fL — ABNORMAL LOW (ref 80.0–100.0)
NRBC: 0 % (ref 0.0–0.2)
Platelets: 328 10*3/uL (ref 150–400)
RBC: 3.53 MIL/uL — ABNORMAL LOW (ref 3.87–5.11)
RDW: 18.8 % — ABNORMAL HIGH (ref 11.5–15.5)
WBC: 6.2 10*3/uL (ref 4.0–10.5)

## 2018-09-05 LAB — PROTIME-INR
INR: 2.06
Prothrombin Time: 22.9 seconds — ABNORMAL HIGH (ref 11.4–15.2)

## 2018-09-05 LAB — POTASSIUM: POTASSIUM: 3.7 mmol/L (ref 3.5–5.1)

## 2018-09-05 LAB — MAGNESIUM: Magnesium: 1.8 mg/dL (ref 1.7–2.4)

## 2018-09-05 MED ORDER — POTASSIUM CHLORIDE CRYS ER 20 MEQ PO TBCR
40.0000 meq | EXTENDED_RELEASE_TABLET | Freq: Two times a day (BID) | ORAL | Status: DC
  Administered 2018-09-05: 40 meq via ORAL
  Filled 2018-09-05: qty 2

## 2018-09-05 MED ORDER — POTASSIUM CHLORIDE CRYS ER 20 MEQ PO TBCR
40.0000 meq | EXTENDED_RELEASE_TABLET | ORAL | Status: DC
  Administered 2018-09-05 (×2): 40 meq via ORAL
  Filled 2018-09-05 (×2): qty 2

## 2018-09-05 MED ORDER — AMOXICILLIN 500 MG PO CAPS: 500.0000 mg | ORAL_CAPSULE | Freq: Three times a day (TID) | ORAL | 0 refills | Status: AC

## 2018-09-05 MED ORDER — AMOXICILLIN 500 MG PO CAPS
500.0000 mg | ORAL_CAPSULE | Freq: Three times a day (TID) | ORAL | Status: DC
  Administered 2018-09-05: 500 mg via ORAL
  Filled 2018-09-05: qty 1

## 2018-09-05 MED ORDER — WARFARIN SODIUM 1 MG PO TABS: 1.0000 mg | ORAL_TABLET | Freq: Once | ORAL | Status: DC

## 2018-09-05 MED ORDER — WARFARIN SODIUM 2 MG PO TABS
2.0000 mg | ORAL_TABLET | Freq: Once | ORAL | Status: DC
  Filled 2018-09-05: qty 1

## 2018-09-05 NOTE — Progress Notes (Signed)
Patient ID: Lauren Chung, female   DOB: January 09, 1966, 52 y.o.   MRN: [id number]         [facility] for Infectious Disease  Date of Admission:  09/01/2018   Total days of antibiotics 5        Day 2 trimethoprim sulfamethoxazole         ASSESSMENT: She is improving and her temperatures are trending down.  She felt feverish last night and had a mild chill and sweat but otherwise is feeling completely back to normal.  Since she is on warfarin for her diagnosed DVT I will change her trimethoprim sulfamethoxazole to amoxicillin to avoid a potentially dangerous drug drug interaction between trimethoprim sulfamethoxazole and warfarin.  PLAN: 1. Change trimethoprim sulfamethoxazole to amoxicillin and treat for 5 more days 2. I will sign off now  Active Problems:   Sepsis ([place])   Fever   Severe recurrent major depression without psychotic features ([place])   History of CVA (cerebrovascular accident)   History of pulmonary embolism   Lupus (HCC)   AKI (acute kidney injury) ([place])   HTN (hypertension)   Normocytic anemia   Scheduled Meds: . desipramine  100 mg Oral Daily  . furosemide  20 mg Intravenous Once  . potassium chloride  40 mEq Oral BID  . potassium chloride  40 mEq Oral Q4H  . sulfamethoxazole-trimethoprim  1 tablet Oral Q12H  . warfarin  1 mg Oral ONCE-1800  . Warfarin - Pharmacist Dosing Inpatient   Does not apply q1800   Continuous Infusions: . sodium chloride Stopped (09/04/18 1308)   PRN Meds:.alprazolam, fentaNYL (SUBLIMAZE) injection, ibuprofen, ondansetron **OR** ondansetron (ZOFRAN) IV, zolpidem   SUBJECTIVE: She felt like she had some fever overnight but overall she is feeling much better.    Review of Systems: Review of Systems  Constitutional: Positive for fever and malaise/fatigue. Negative for chills and diaphoresis.  HENT: Negative for congestion and sore throat.   Respiratory: Negative for cough.   Cardiovascular: Negative for chest pain.    Gastrointestinal: Negative for abdominal pain, diarrhea, nausea and vomiting.  Genitourinary: Negative for dysuria, frequency and urgency.  Musculoskeletal: Negative for back pain and myalgias.  Skin: Negative for rash.  Neurological: Negative for headaches.    Allergies  Allergen Reactions  . Codeine Shortness Of Breath  . Contrast Media [Iodinated Diagnostic Agents] Shortness Of Breath  . Tylenol [Acetaminophen] Shortness Of Breath    OBJECTIVE: Vitals:   09/05/18 0406 09/05/18 0416 09/05/18 0741 09/05/18 0800  BP: 103/73  112/61   Pulse:   98   Resp:   18   Temp:  97.7 F (36.5 C)  98.1 F (36.7 C)  TempSrc:  Oral  Oral  SpO2:   100%   Weight:      Height:       Body mass index is 28.19 kg/m.  Physical Exam  Constitutional: She is oriented to person, place, and time.  She is in very good spirits.    HENT:  Mouth/Throat: No oropharyngeal exudate.  Eyes: Conjunctivae are normal.  Neck: Neck supple.  Cardiovascular: Normal rate, regular rhythm and normal heart sounds.  Pulmonary/Chest: Effort normal and breath sounds normal.  Abdominal: There is no tenderness. There is no guarding.  She has no CVA tenderness.  Neurological: She is alert and oriented to person, place, and time.  Skin: No rash noted.  Psychiatric: She has a normal mood and affect.    Lab Results Lab Results  Component Value Date  WBC 6.2 09/05/2018   HGB 8.6 (L) 09/05/2018   HCT 28.1 (L) 09/05/2018   MCV 79.6 (L) 09/05/2018   PLT 328 09/05/2018    Lab Results  Component Value Date   CREATININE 0.98 09/05/2018   BUN 9 09/05/2018   NA 137 09/05/2018   K 2.9 (L) 09/05/2018   CL 104 09/05/2018   CO2 24 09/05/2018    Lab Results  Component Value Date   ALT 19 09/02/2018   AST 28 09/02/2018   ALKPHOS 93 09/02/2018   BILITOT 0.9 09/02/2018     Microbiology: Recent Results (from the past 240 hour(s))  Culture, blood (Routine x 2)     Status: None (Preliminary result)   Collection  Time: 09/01/18  1:20 PM  Result Value Ref Range Status   Specimen Description   Final    BLOOD RIGHT ANTECUBITAL Performed at [facility], [place] [address] [place], [place] [address]    Special Requests   Final    Blood Culture results may not be optimal due to an inadequate volume of blood received in culture bottles BOTTLES DRAWN AEROBIC AND ANAEROBIC   Culture   Final    NO GROWTH 3 DAYS Performed at [facility], [place] [address], [place], [place] [address]    Report Status PENDING  Incomplete  Culture, blood (Routine x 2)     Status: None (Preliminary result)   Collection Time: 09/01/18  1:44 PM  Result Value Ref Range Status   Specimen Description   Final    BLOOD LEFT ANTECUBITAL Performed at [place] [address], [place], [place] [address]    Special Requests   Final    BOTTLES DRAWN AEROBIC AND ANAEROBIC Blood Culture adequate volume   Culture   Final    NO GROWTH 3 DAYS Performed at [facility], [place] [address] [place], [place] [address]    Report Status PENDING  Incomplete  Urine culture     Status: Abnormal   Collection Time: 09/01/18  3:19 PM  Result Value Ref Range Status   Specimen Description   Final    URINE, CLEAN CATCH Performed at [facility], [place] [address], [place], [place] [address]    Special Requests   Final    NONE Performed at  [facility] [place] [address] [place], [place] [address]    Culture >=100,000 COLONIES/mL ESCHERICHIA COLI (A)  Final   Report Status 09/04/2018 FINAL  Final   Organism ID, Bacteria ESCHERICHIA COLI (A)  Final      Susceptibility   Escherichia coli - MIC*    AMPICILLIN <=2 SENSITIVE Sensitive     CEFAZOLIN <=4 SENSITIVE Sensitive     CEFTRIAXONE <=1 SENSITIVE Sensitive     CIPROFLOXACIN <=0.25 SENSITIVE Sensitive     GENTAMICIN <=1 SENSITIVE Sensitive     IMIPENEM <=0.25 SENSITIVE Sensitive     NITROFURANTOIN <=16 SENSITIVE  Sensitive     TRIMETH/SULFA <=20 SENSITIVE Sensitive     AMPICILLIN/SULBACTAM <=2 SENSITIVE Sensitive     PIP/TAZO <=4 SENSITIVE Sensitive     Extended ESBL NEGATIVE Sensitive     * >=100,000 COLONIES/mL ESCHERICHIA COLI  MRSA PCR Screening     Status: None   Collection Time: 09/02/18  5:20 PM  Result Value Ref Range Status   MRSA by PCR NEGATIVE NEGATIVE Final    Comment:        The GeneXpert MRSA Assay (FDA approved for  NASAL specimens only), is one component of a comprehensive MRSA colonization surveillance program. It is not intended to diagnose MRSA infection nor to guide or monitor treatment for MRSA infections. Performed at [facility], [place] [address] [place], [place] [address]     Michel Bickers, [place] for [place] [facility] [phone] pager   [phone] cell 09/05/2018, 10:55 AM

## 2018-09-05 NOTE — Discharge Summary (Signed)
Physician Discharge Summary  Lauren Chung ZJI:[id number] DOB: 1965-12-16 DOA: 09/01/2018  PCP: Damaris Hippo, MD (Inactive)  Admit date: 09/01/2018 Discharge date: 09/05/2018  Time spent: 40 minutes  Recommendations for Outpatient Follow-up:  1. Follow up PCP in 2 weeks   Discharge Diagnoses:  Active Problems:   Severe recurrent major depression without psychotic features ([place])   History of CVA (cerebrovascular accident)   History of pulmonary embolism   Lupus (HCC)   AKI (acute kidney injury) ([place])   Sepsis (HCC)   HTN (hypertension)   Fever   Normocytic anemia   Discharge Condition: Stable  Diet recommendation: Regular diet  Filed Weights   09/02/18 1650  Weight: 84.1 kg    History of present illness:  52 year old female with a history of lupus, pulmonary embolism on chronic anticoagulation on warfarin, stroke, depression came with 1 week history of fever and generalized body aches.  Patient had cough 1 week ago with coughing stopped and patient continues to have fever with generalized body aches.  In the ED patient presented with temperature 102 F, leukocytosis WBC 13,000, tachycardia with heart rate 130s.  Hospital Course:  1. Sepsis-secondary to pyelonephritis, resolved, patient patient presented with fever, hypotension mild tachycardia, leukocytosis.  Initially started on ceftriaxone for possible UTI.  Patient's blood pressure dropped so she was transferred to stepdown unit, started on vancomycin and ceftazidime.  At this time blood pressure has improved.  Patient feels better.  Urine culture growing E. coli which is pansensitive.  ID was consulted and have change antibiotics from IV ceftazidime to p.o. Bactrim and finally to Amoxicillin for 5 more days.  2. Abdominal pain- resolved, patient presented with abdominal pain which has resolved at this time, likely from pyelonephritis.   CT abdomen pelvis obtained showed no acute abnormality.   3. Acute kidney  injury-patient presented with creatinine of 1.42, it has improved to 0.96 with IV fluids.    4. History of pulmonary embolism-continue Coumadin   5. History of lupus-stable  6. History of depression-continue desipramine, Ambien, Xanax as needed  7. Hypokalemia- replete   Procedures:  )  Consultations:  ID  Discharge Exam: Vitals:   09/05/18 0800 09/05/18 1216  BP:  99/71  Pulse:  100  Resp:  (!) 24  Temp: 98.1 F (36.7 C) 98.6 F (37 C)  SpO2:  94%    General: Appears in no acute distress Cardiovascular: S1S2 RRR Respiratory: Clear bilaterally  Discharge Instructions   Discharge Instructions    Diet - low sodium heart healthy   Complete by:  As directed    Diet - low sodium heart healthy   Complete by:  As directed    Increase activity slowly   Complete by:  As directed    Increase activity slowly   Complete by:  As directed      Allergies as of 09/05/2018      Reactions   Codeine Shortness Of Breath   Contrast Media [iodinated Diagnostic Agents] Shortness Of Breath   Tylenol [acetaminophen] Shortness Of Breath      Medication List    TAKE these medications   alprazolam 2 MG tablet Commonly known as:  XANAX Take 1 tablet (2 mg total) by mouth at bedtime as needed for sleep.   amoxicillin 500 MG capsule Commonly known as:  AMOXIL Take 1 capsule (500 mg total) by mouth every 8 (eight) hours for 5 days.   desipramine 50 MG tablet Commonly known as:  NORPRAMIN 2  qam What changed:  how much to take  how to take this  when to take this   LINZESS 72 MCG capsule Generic drug:  linaclotide Take 72 mcg by mouth daily before breakfast.   magnesium oxide 400 (241.3 Mg) MG tablet Commonly known as:  MAG-OX Take 0.5 tablets (200 mg total) by mouth 2 (two) times daily.   senna-docusate 8.6-50 MG tablet Commonly known as:  Senokot-S Take 1 tablet by mouth at bedtime as needed for mild constipation.   warfarin 2 MG tablet Commonly known  as:  COUMADIN Take 2 mg by mouth daily. What changed:  Another medication with the same name was removed. Continue taking this medication, and follow the directions you see here.   zolpidem 12.5 MG CR tablet Commonly known as:  AMBIEN CR Take 1 tablet (12.5 mg total) by mouth at bedtime as needed for sleep.      Allergies  Allergen Reactions  . Codeine Shortness Of Breath  . Contrast Media [Iodinated Diagnostic Agents] Shortness Of Breath  . Tylenol [Acetaminophen] Shortness Of Breath   Follow-up Information    Damaris Hippo, MD Follow up in 2 week(s).   Specialty:  Family Medicine Contact information: [place] [id number] [phone]            The results of significant diagnostics from this hospitalization (including imaging, microbiology, ancillary and laboratory) are listed below for reference.    Significant Diagnostic Studies: Ct Abdomen Pelvis Wo Contrast  Result Date: 09/01/2018 CLINICAL DATA:  Fever, leukocytosis.  Abdominal pain. EXAM: CT ABDOMEN AND PELVIS WITHOUT CONTRAST TECHNIQUE: Multidetector CT imaging of the abdomen and pelvis was performed following the standard protocol without IV contrast. COMPARISON:  CT scan of November 19, 2017. FINDINGS: Lower chest: No acute abnormality. Hepatobiliary: No gallstones or biliary dilatation is noted. Stable left hepatic cyst is noted. Pancreas: Unremarkable. No pancreatic ductal dilatation or surrounding inflammatory changes. Spleen: Normal in size without focal abnormality. Adrenals/Urinary Tract: Adrenal glands are unremarkable. Kidneys are normal, without renal calculi, focal lesion, or hydronephrosis. Bladder is unremarkable. Stomach/Bowel: Stomach is within normal limits. Appendix appears normal. No evidence of bowel wall thickening, distention, or inflammatory changes. Vascular/Lymphatic: No significant vascular findings are present. No enlarged abdominal or pelvic lymph nodes.  Reproductive: Uterus and bilateral adnexa are unremarkable. Other: No abdominal wall hernia or abnormality. No abdominopelvic ascites. Musculoskeletal: No acute or significant osseous findings. IMPRESSION: No acute abnormality seen in the abdomen or pelvis. Electronically Signed   By: Marijo Conception, M.D.   On: 09/01/2018 20:55   Dg Chest 2 View  Result Date: 09/01/2018 CLINICAL DATA:  Cough and tachycardia EXAM: CHEST - 2 VIEW COMPARISON:  11/19/2017 FINDINGS: The heart size and mediastinal contours are within normal limits. Both lungs are clear. The visualized skeletal structures are unremarkable. IMPRESSION: Clear lungs. Electronically Signed   By: Ulyses Jarred M.D.   On: 09/01/2018 14:06   Dg Chest Port 1v Same Day  Result Date: 09/03/2018 CLINICAL DATA:  Sepsis secondary to pyelonephritis. EXAM: PORTABLE CHEST 1 VIEW COMPARISON:  09/01/2018 FINDINGS: Normal heart size and mediastinal contours. Mild pulmonary vascular congestion. Slightly more confluent pulmonary opacities are noted at the right lung base that may represent superimposed pneumonia although more likely findings related to edema. No effusion or pneumothorax. IMPRESSION: Pulmonary vascular congestion with slightly more confluent opacities at the right lung base suspicious for pneumonia though findings are more likely related to pulmonary edema. Electronically Signed   By: Ashley Royalty  M.D.   On: 09/03/2018 17:28   US Abdomen Limited Ruq  Result Date: 09/01/2018 CLINICAL DATA:  Upper abdominal pain EXAM: ULTRASOUND ABDOMEN LIMITED RIGHT UPPER QUADRANT COMPARISON:  CT abdomen/pelvis 11/19/2017 FINDINGS: Gallbladder: No gallstones or wall thickening visualized. No sonographic Murphy sign noted by sonographer. Common bile duct: Diameter: 4.1 mm Liver: Normal hepatic parenchymal echogenicity. 2 x 1.9 x 2.4 cm anechoic left hepatic mass most consistent with a cyst. Portal vein is patent on color Doppler imaging with normal direction of  blood flow towards the liver. IMPRESSION: 1. No cholelithiasis or sonographic evidence of acute cholecystitis. Mild increased hepatic echogenicity as can be seen with hepatic steatosis. Electronically Signed   By: Kathreen Devoid   On: 09/01/2018 16:30    Microbiology: Recent Results (from the past 240 hour(s))  Culture, blood (Routine x 2)     Status: None (Preliminary result)   Collection Time: 09/01/18  1:20 PM  Result Value Ref Range Status   Specimen Description   Final    BLOOD RIGHT ANTECUBITAL Performed at [facility] [address], [place], [place] [address]    Special Requests   Final    Blood Culture results may not be optimal due to an inadequate volume of blood received in culture bottles BOTTLES DRAWN AEROBIC AND ANAEROBIC   Culture   Final    NO GROWTH 3 DAYS Performed at [facility], [place] [address], [place], [place] [address]    Report Status PENDING  Incomplete  Culture, blood (Routine x 2)     Status: None (Preliminary result)   Collection Time: 09/01/18  1:44 PM  Result Value Ref Range Status   Specimen Description   Final    BLOOD LEFT ANTECUBITAL Performed at [facility] [address] [place], [place] [address]    Special Requests   Final    BOTTLES DRAWN AEROBIC AND ANAEROBIC Blood Culture adequate volume   Culture   Final    NO GROWTH 3 DAYS Performed at [facility], [place] [address], [place], [place] [address]    Report Status PENDING  Incomplete  Urine culture     Status: Abnormal   Collection Time: 09/01/18  3:19 PM  Result Value Ref Range Status   Specimen Description   Final    URINE, CLEAN CATCH Performed at [facility], [place] [address] [place], [place] [address]    Special Requests   Final    NONE Performed at [facility], [place] [address], [place],  [address]    Culture >=100,000 COLONIES/mL ESCHERICHIA COLI (A)  Final   Report Status  09/04/2018 FINAL  Final   Organism ID, Bacteria ESCHERICHIA COLI (A)  Final      Susceptibility   Escherichia coli - MIC*    AMPICILLIN <=2 SENSITIVE Sensitive     CEFAZOLIN <=4 SENSITIVE Sensitive     CEFTRIAXONE <=1 SENSITIVE Sensitive     CIPROFLOXACIN <=0.25 SENSITIVE Sensitive     GENTAMICIN <=1 SENSITIVE Sensitive     IMIPENEM <=0.25 SENSITIVE Sensitive     NITROFURANTOIN <=16 SENSITIVE Sensitive     TRIMETH/SULFA <=20 SENSITIVE Sensitive     AMPICILLIN/SULBACTAM <=2 SENSITIVE Sensitive     PIP/TAZO <=4 SENSITIVE Sensitive     Extended ESBL NEGATIVE Sensitive     * >=100,000 COLONIES/mL ESCHERICHIA COLI  MRSA PCR Screening     Status: None   Collection Time: 09/02/18  5:20 PM  Result  Value Ref Range Status   MRSA by PCR NEGATIVE NEGATIVE Final    Comment:        The GeneXpert MRSA Assay (FDA approved for NASAL specimens only), is one component of a comprehensive MRSA colonization surveillance program. It is not intended to diagnose MRSA infection nor to guide or monitor treatment for MRSA infections. Performed at [facility], [place] [address], [place], [place] [address]      Labs: Basic Metabolic Panel: Recent Labs  Lab 09/01/18 1343 09/01/18 2200 09/02/18 0616 09/03/18 0329 09/04/18 0240 09/05/18 0329 09/05/18 1438  NA 129*  --  135 136 136 137  --   K 2.8*  --  4.0 3.3* 3.0* 2.9* 3.7  CL 91*  --  102 109 104 104  --   CO2 25  --  24 20* 23 24  --   GLUCOSE 111*  --  83 85 83 75  --   BUN 12  --  11 11 10 9   --   CREATININE 1.42*  --  1.05* 0.96 1.04* 0.98  --   CALCIUM 8.3*  --  7.8* 7.2* 7.5* 7.5*  --   MG  --  1.6*  --   --   --  1.8  --    Liver Function Tests: Recent Labs  Lab 09/01/18 1343 09/02/18 0616  AST 36 28  ALT 26 19  ALKPHOS 111 93  BILITOT 1.1 0.9  PROT 8.1 6.8  ALBUMIN 3.6 2.6*   Recent Labs  Lab 09/01/18 2200  LIPASE 22   No results for input(s): AMMONIA in the last 168 hours. CBC: Recent Labs  Lab  09/01/18 1343 09/02/18 0616 09/03/18 0329 09/04/18 0240 09/05/18 0329  WBC 13.2* 13.2* 10.0 8.9 6.2  NEUTROABS 11.3*  --   --   --   --   HGB 11.4* 9.4* 8.1* 8.6* 8.6*  HCT 37.8 30.6* 27.3* 28.5* 28.1*  MCV 80.8 80.1 81.7 81.4 79.6*  PLT 346 286 271 309 328       Signed:  Oswald Hillock MD.  [facility] 09/05/2018, 3:38 PM

## 2018-09-05 NOTE — Progress Notes (Addendum)
ANTICOAGULATION CONSULT NOTE - follow up  Pharmacy Consult for warfarin Indication: Hx PE  Allergies  Allergen Reactions  . Codeine Shortness Of Breath  . Contrast Media [Iodinated Diagnostic Agents] Shortness Of Breath  . Tylenol [Acetaminophen] Shortness Of Breath    Patient Measurements: Height: 5\' 8"  (172.7 cm) Weight: 185 lb 6.5 oz (84.1 kg) IBW/kg (Calculated) : 63.9  Vital Signs: Temp: 97.7 F (36.5 C) (11/03 0416) Temp Source: Oral (11/03 0416) BP: 103/73 (11/03 0406) Pulse Rate: 87 (11/02 2123)  Labs: Recent Labs    09/03/18 0329 09/04/18 0240 09/05/18 0329  HGB 8.1* 8.6* 8.6*  HCT 27.3* 28.5* 28.1*  PLT 271 309 328  LABPROT 20.3* 23.5* 22.9*  INR 1.76 2.12 2.06  CREATININE 0.96 1.04* 0.98    Estimated Creatinine Clearance: 77.2 mL/min (by C-G formula based on SCr of 0.98 mg/dL).   Medical History: Past Medical History:  Diagnosis Date  . Anxiety   . Depression   . Lupus ([place])   . PE (pulmonary embolism)   . PTSD (post-traumatic stress disorder)   . Stroke ([place])   . TIA (transient ischemic attack)     Medications:  Medications Prior to Admission  Medication Sig Dispense Refill Last Dose  . alprazolam (XANAX) 2 MG tablet Take 1 tablet (2 mg total) by mouth at bedtime as needed for sleep. 30 tablet 2 08/31/2018 at Unknown time  . desipramine (NORPRAMIN) 50 MG tablet 2  qam (Patient taking differently: Take 100 mg by mouth daily. 2  qam) 180 tablet 2 08/31/2018 at Unknown time  . linaclotide (LINZESS) 72 MCG capsule Take 72 mcg by mouth daily before breakfast.   Past Week at Unknown time  . warfarin (COUMADIN) 2 MG tablet Take 2 mg by mouth daily.  0 08/31/2018 at 10am  . zolpidem (AMBIEN CR) 12.5 MG CR tablet Take 1 tablet (12.5 mg total) by mouth at bedtime as needed for sleep. 30 tablet 5 08/31/2018 at Unknown time  . magnesium oxide (MAG-OX) 400 (241.3 Mg) MG tablet Take 0.5 tablets (200 mg total) by mouth 2 (two) times daily. (Patient not  taking: Reported on 09/01/2018) 10 tablet 0 Not Taking at Unknown time  . senna-docusate (SENOKOT-S) 8.6-50 MG tablet Take 1 tablet by mouth at bedtime as needed for mild constipation. (Patient not taking: Reported on 09/01/2018) 10 tablet 0 Not Taking at Unknown time  . warfarin (COUMADIN) 5 MG tablet Take 1 tablet (5 mg total) by mouth daily at 6 PM. Adjust dose based on INR with primary care physician in 5 days (Patient not taking: Reported on 09/01/2018) 30 tablet 0 Not Taking at Unknown time   Scheduled:  . desipramine  100 mg Oral Daily  . furosemide  20 mg Intravenous Once  . potassium chloride  40 mEq Oral BID  . sulfamethoxazole-trimethoprim  1 tablet Oral Q12H  . Warfarin - Pharmacist Dosing Inpatient   Does not apply q1800   PRN:   Assessment: 89 yoF with PMH lupus, PE on warfarin, TIA, depression, admitted with FUO; possible UTI. Pharmacy to dose warfarin while admitted. Baseline INR 1.7   Prior anticoagulation: warfarin 2 mg daily, LD 10/29  Today, 09/05/2018:  INR therapeutic at 2.06, stable, received  1 mg warfarin yesterday after Bactrim started  Hgb low but stable, Plts WNL - monitor  Major drug interactions: broad spectrum abx have been changed to Bactrim, which has been then changed to amoxicillin to avoid interaction with warfarin   No bleeding issues noted  Goal of Therapy: INR 2-3  Plan:  Warfarin 2 mg today at 1800  Upon discharge, would recommend continuing home dose of warfarin 2 mg PO daily   Daily INR  CBC at least q72 hr while on warfarin  Monitor for signs of bleeding or thrombosis    Royetta Asal, PharmD, BCPS Pager [phone] 09/05/2018 7:30 AM

## 2018-09-06 LAB — CULTURE, BLOOD (ROUTINE X 2)
CULTURE: NO GROWTH
Culture: NO GROWTH
Special Requests: ADEQUATE

## 2018-09-16 DIAGNOSIS — Z7901 Long term (current) use of anticoagulants: Secondary | ICD-10-CM | POA: Diagnosis not present

## 2018-09-16 DIAGNOSIS — Z86711 Personal history of pulmonary embolism: Secondary | ICD-10-CM | POA: Diagnosis not present

## 2018-10-17 ENCOUNTER — Other Ambulatory Visit (HOSPITAL_COMMUNITY): Payer: Self-pay | Admitting: Psychiatry

## 2018-11-02 DIAGNOSIS — Z86711 Personal history of pulmonary embolism: Secondary | ICD-10-CM | POA: Diagnosis not present

## 2018-11-02 DIAGNOSIS — Z7901 Long term (current) use of anticoagulants: Secondary | ICD-10-CM | POA: Diagnosis not present

## 2018-11-12 ENCOUNTER — Emergency Department (HOSPITAL_COMMUNITY): Payer: Medicare Other

## 2018-11-12 ENCOUNTER — Encounter (HOSPITAL_COMMUNITY): Payer: Self-pay

## 2018-11-12 ENCOUNTER — Emergency Department (HOSPITAL_COMMUNITY)
Admission: EM | Admit: 2018-11-12 | Discharge: 2018-11-12 | Disposition: A | Discharge status: Home / Self Care | Payer: Medicare Other | Attending: Emergency Medicine | Admitting: Emergency Medicine

## 2018-11-12 ENCOUNTER — Other Ambulatory Visit: Payer: Self-pay

## 2018-11-12 DIAGNOSIS — I10 Essential (primary) hypertension: Secondary | ICD-10-CM | POA: Insufficient documentation

## 2018-11-12 DIAGNOSIS — Z7901 Long term (current) use of anticoagulants: Secondary | ICD-10-CM | POA: Diagnosis not present

## 2018-11-12 DIAGNOSIS — M545 Low back pain, unspecified: Secondary | ICD-10-CM

## 2018-11-12 DIAGNOSIS — Z79899 Other long term (current) drug therapy: Secondary | ICD-10-CM | POA: Insufficient documentation

## 2018-11-12 DIAGNOSIS — E876 Hypokalemia: Secondary | ICD-10-CM | POA: Diagnosis not present

## 2018-11-12 DIAGNOSIS — R0602 Shortness of breath: Secondary | ICD-10-CM | POA: Diagnosis not present

## 2018-11-12 DIAGNOSIS — N182 Chronic kidney disease, stage 2 (mild): Secondary | ICD-10-CM | POA: Diagnosis not present

## 2018-11-12 LAB — BASIC METABOLIC PANEL
Anion gap: 13 (ref 5–15)
BUN: 8 mg/dL (ref 6–20)
CHLORIDE: 93 mmol/L — AB (ref 98–111)
CO2: 28 mmol/L (ref 22–32)
CREATININE: 1.33 mg/dL — AB (ref 0.44–1.00)
Calcium: 9 mg/dL (ref 8.9–10.3)
GFR calc non Af Amer: 46 mL/min — ABNORMAL LOW (ref >60)
GFR, EST AFRICAN AMERICAN: 54 mL/min — AB (ref >60)
Glucose, Bld: 61 mg/dL — ABNORMAL LOW (ref 70–99)
POTASSIUM: 2.7 mmol/L — AB (ref 3.5–5.1)
Sodium: 134 mmol/L — ABNORMAL LOW (ref 135–145)

## 2018-11-12 LAB — CBC
HEMATOCRIT: 40.6 % (ref 36.0–46.0)
HEMOGLOBIN: 12.3 g/dL (ref 12.0–15.0)
MCH: 24.6 pg — ABNORMAL LOW (ref 26.0–34.0)
MCHC: 30.3 g/dL (ref 30.0–36.0)
MCV: 81.2 fL (ref 80.0–100.0)
NRBC: 0 % (ref 0.0–0.2)
Platelets: 424 10*3/uL — ABNORMAL HIGH (ref 150–400)
RBC: 5 MIL/uL (ref 3.87–5.11)
RDW: 16.7 % — AB (ref 11.5–15.5)
WBC: 8.5 10*3/uL (ref 4.0–10.5)

## 2018-11-12 LAB — I-STAT BETA HCG BLOOD, ED (MC, WL, AP ONLY): HCG, QUANTITATIVE: 16 m[IU]/mL — AB (ref <5)

## 2018-11-12 LAB — I-STAT TROPONIN, ED: Troponin i, poc: 0 ng/mL (ref 0.00–0.08)

## 2018-11-12 LAB — PROTIME-INR
INR: 2.11
Prothrombin Time: 23.3 seconds — ABNORMAL HIGH (ref 11.4–15.2)

## 2018-11-12 MED ORDER — TECHNETIUM TO 99M ALBUMIN AGGREGATED
4.4000 | Freq: Once | INTRAVENOUS | Status: AC | PRN
  Administered 2018-11-12: 4.4 via INTRAVENOUS

## 2018-11-12 MED ORDER — TECHNETIUM TC 99M DIETHYLENETRIAME-PENTAACETIC ACID
32.2000 | Freq: Once | INTRAVENOUS | Status: AC | PRN
  Administered 2018-11-12: 32.2 via RESPIRATORY_TRACT

## 2018-11-12 MED ORDER — ACETAMINOPHEN 325 MG PO TABS
650.0000 mg | ORAL_TABLET | Freq: Once | ORAL | Status: AC
  Administered 2018-11-12: 650 mg via ORAL
  Filled 2018-11-12: qty 2

## 2018-11-12 MED ORDER — POTASSIUM CHLORIDE CRYS ER 20 MEQ PO TBCR
40.0000 meq | EXTENDED_RELEASE_TABLET | Freq: Once | ORAL | Status: AC
  Administered 2018-11-12: 40 meq via ORAL
  Filled 2018-11-12: qty 2

## 2018-11-12 NOTE — ED Triage Notes (Signed)
Pt states she has a hx of pulmonary embolisms. Pt states that she has been having left sided back rib area pain x2 weeks, as well as shortness of breath. patient states pain kept her awake last night. Pt states that she has also had some dizziness. Pt states she went to her PCP and he directed her to the ED.

## 2018-11-12 NOTE — ED Notes (Addendum)
CRITICAL VALUE STICKER  CRITICAL VALUE: Potassium 2.7  RECEIVER (on-site recipient of call): Bartholome Bill  DATE & TIME NOTIFIED: 11/12/18 1044  MESSENGER (representative from lab): Tammy  MD NOTIFIED: Dr Winfred Leeds  TIME OF NOTIFICATION:1045  RESPONSE: awaiting orders

## 2018-11-12 NOTE — ED Notes (Signed)
Pt transported to [facility]  

## 2018-11-12 NOTE — Discharge Instructions (Signed)
Your blood potassium level is 2.7, slightly low.  You for you were given oral potassium tablets here.  Your kidney function is slightly abnormal with blood creatinine level at 1.33.  Make an appointment with Dr.SWayne in the next 2 or 3 weeks to get your blood creatinine and blood potassium level rechecked.  You should have a conversation with Dr. Moreen Fowler regarding regarding whether you have a true allergy to intravenous contrast dye and whether contrast dye should be listed as an allergy in your medical record.  Pain is not considered to be an allergy.  Take these instructions with you when going to see Dr.Swayne.  You can take Tylenol as directed for pain

## 2018-11-12 NOTE — ED Provider Notes (Signed)
[facility] DEPT Provider Note   CSN: [id number] Arrival date & time: 11/12/18  4580     History   Chief Complaint Chief Complaint  Patient presents with  . Back Pain  . Shortness of Breath    HPI Lauren Chung is a 53 y.o. female.  HPI Patient complains of left-sided low thoracic back pain gradual onset 2 weeks ago.  Symptoms accompanied by dizziness i.e. feeling of room spinning not lightheadedness and shortness of breath.  She is concerned that it feels like pulmonary embolism she has had in the past.  She reports her last INR was 1.6.  Her dosage of warfarin was increased last week.  She is next due to have INR rechecked 11/16/2017.  Denies fever denies cough denies other complaint.  Pain worse with changing positions and improved with remaining still.  No treatment prior to coming here Past Medical History:  Diagnosis Date  . Anxiety   . Depression   . Lupus ([place])   . PE (pulmonary embolism)   . PTSD (post-traumatic stress disorder)   . Stroke ([place])   . TIA (transient ischemic attack)     Patient Active Problem List   Diagnosis Date Noted  . Normocytic anemia 09/02/2018  . Fever 09/01/2018  . Sepsis ([place]) 11/16/2017  . HTN (hypertension) 11/16/2017  . History of CVA (cerebrovascular accident)   . History of pulmonary embolism   . Lupus ([place])   . AKI (acute kidney injury) ([place])   . Severe recurrent major depression without psychotic features ([place]) 09/30/2016    Class: Chronic    Past Surgical History:  Procedure Laterality Date  . ABDOMINAL SURGERY    . ABLATION       OB History   No obstetric history on file.      Home Medications    Prior to Admission medications   Medication Sig Start Date End Date Taking? Authorizing Provider  alprazolam Duanne Moron) 2 MG tablet Take 1 tablet (2 mg total) by mouth at bedtime as needed for sleep. 08/18/18   Plovsky, Berneta Sages, MD  desipramine (NORPRAMIN) 50 MG tablet 2  qam Patient taking  differently: Take 100 mg by mouth daily. 2  qam 08/18/18   Plovsky, Berneta Sages, MD  linaclotide [facility]) 72 MCG capsule Take 72 mcg by mouth daily before breakfast.    [provider]  magnesium oxide (MAG-OX) 400 (241.3 Mg) MG tablet Take 0.5 tablets (200 mg total) by mouth 2 (two) times daily. Patient not taking: Reported on 09/01/2018 11/20/17   Regalado, Jerald Kief A, MD  senna-docusate (SENOKOT-S) 8.6-50 MG tablet Take 1 tablet by mouth at bedtime as needed for mild constipation. Patient not taking: Reported on 09/01/2018 11/20/17   Regalado, Jerald Kief A, MD  warfarin (COUMADIN) 2 MG tablet Take 2 mg by mouth daily. 08/16/18   [provider]  zolpidem (AMBIEN CR) 12.5 MG CR tablet Take 1 tablet (12.5 mg total) by mouth at bedtime as needed for sleep. 08/18/18   Norma Fredrickson, MD    Family History Family History  Problem Relation Age of Onset  . Leukemia Father   . Seizures Daughter   . Drug abuse Mother   . Alcohol abuse Mother     Social History Social History   Tobacco Use  . Smoking status: Never Smoker  . Smokeless tobacco: Never Used  Substance Use Topics  . Alcohol use: No  . Drug use: No     Allergies   Codeine; Contrast media [iodinated diagnostic  agents]; and Tylenol [acetaminophen] She reports allergy to contrast dye is pain.  She reports allergy to Tylenol is vomiting.  Strongly doubt true allergic reaction  Review of Systems Review of Systems  Constitutional: Negative.   HENT: Negative.   Respiratory: Positive for shortness of breath.   Cardiovascular: Negative.   Gastrointestinal: Negative.   Genitourinary:       Postmenopausal  Musculoskeletal: Positive for back pain.  Skin: Negative.   Neurological: Positive for dizziness.  Hematological: Bruises/bleeds easily.  Psychiatric/Behavioral: Negative.   All other systems reviewed and are negative.    Physical Exam Updated Vital Signs BP 137/85 (BP Location: Left Arm)   Pulse (!) 116    Temp 97.9 F (36.6 C) (Oral)   Resp 20   Ht 5\' 8"  (1.727 m)   Wt 81.6 kg   SpO2 100%   BMI 27.37 kg/m   Physical Exam Vitals signs and nursing note reviewed.  Constitutional:      Appearance: She is well-developed.  HENT:     Head: Normocephalic and atraumatic.  Eyes:     Conjunctiva/sclera: Conjunctivae normal.     Pupils: Pupils are equal, round, and reactive to light.  Neck:     Musculoskeletal: Neck supple.     Thyroid: No thyromegaly.     Trachea: No tracheal deviation.  Cardiovascular:     Rate and Rhythm: Normal rate and regular rhythm.     Heart sounds: No murmur.  Pulmonary:     Effort: Pulmonary effort is normal.     Breath sounds: Normal breath sounds.     Comments: Mildly tender at right lateral rib cage, posteriorly at inferior aspect.  Pain reproducible when she sits up from a supine position Abdominal:     General: Bowel sounds are normal. There is no distension.     Palpations: Abdomen is soft.     Tenderness: There is no abdominal tenderness.  Musculoskeletal: Normal range of motion.        General: No tenderness.  Skin:    General: Skin is warm and dry.     Findings: No rash.  Neurological:     Mental Status: She is alert.     Coordination: Coordination normal.      ED Treatments / Results  Labs (all labs ordered are listed, but only abnormal results are displayed) Labs Reviewed  BASIC METABOLIC PANEL  CBC  I-STAT TROPONIN, ED  I-STAT BETA HCG BLOOD, ED ([facility], [facility], [facility] ONLY)    EKG None ED ECG REPORT   Date: 11/12/2018  Rate: 90  Rhythm: normal sinus rhythm  QRS Axis: normal  Intervals: normal  ST/T Wave abnormalities: nonspecific T wave changes  Conduction Disutrbances:none  Narrative Interpretation:   Old EKG Reviewed: changes noted Rates  slower than 09/03/2018 otherwise no significant change I have personally reviewed the EKG tracing and agree with the computerized printout as noted. Radiology No results  found.  Procedures Procedures (including critical care time)  Medications Ordered in ED Medications - No data to display Chest x-ray viewed by me 3 PM patient feels improved after treatment with Tylenol.  He has a low pretest clinical probability for pulmonary embolism.  Negative VQ scan.  Lab work remarkable for hypokalemia and therapeutic INR Results for orders placed or performed during the hospital encounter of 29/56/21  Basic metabolic panel  Result Value Ref Range   Sodium 134 (L) 135 - 145 mmol/L   Potassium 2.7 (LL) 3.5 - 5.1 mmol/L   Chloride 93 (  L) 98 - 111 mmol/L   CO2 28 22 - 32 mmol/L   Glucose, Bld 61 (L) 70 - 99 mg/dL   BUN 8 6 - 20 mg/dL   Creatinine, Ser 1.33 (H) 0.44 - 1.00 mg/dL   Calcium 9.0 8.9 - 10.3 mg/dL   GFR calc non Af Amer 46 (L) >60 mL/min   GFR calc Af Amer 54 (L) >60 mL/min   Anion gap 13 5 - 15  CBC  Result Value Ref Range   WBC 8.5 4.0 - 10.5 K/uL   RBC 5.00 3.87 - 5.11 MIL/uL   Hemoglobin 12.3 12.0 - 15.0 g/dL   HCT 40.6 36.0 - 46.0 %   MCV 81.2 80.0 - 100.0 fL   MCH 24.6 (L) 26.0 - 34.0 pg   MCHC 30.3 30.0 - 36.0 g/dL   RDW 16.7 (H) 11.5 - 15.5 %   Platelets 424 (H) 150 - 400 K/uL   nRBC 0.0 0.0 - 0.2 %  Protime-INR  Result Value Ref Range   Prothrombin Time 23.3 (H) 11.4 - 15.2 seconds   INR 2.11   I-stat troponin, ED  Result Value Ref Range   Troponin i, poc 0.00 0.00 - 0.08 ng/mL   Comment 3          I-Stat beta hCG blood, ED  Result Value Ref Range   I-stat hCG, quantitative 16.0 (H) <5 mIU/mL   Comment 3           Dg Chest 2 View  Result Date: 11/12/2018 CLINICAL DATA:  History of pulmonary embolism. Shortness of breath. Pain. EXAM: CHEST - 2 VIEW COMPARISON:  09/03/2018. FINDINGS: Mediastinum hilar structures normal. Heart size normal. Mild peribronchial cuffing. Bronchitis could not be excluded. No focal infiltrate. Previously identified infiltrate right lung base has cleared. No pleural effusion or pneumothorax. No acute  bony abnormality. IMPRESSION: Peribronchial cuffing. Bronchitis could not be excluded. No acute abnormality otherwise noted. Previously identified right base infiltrate noted on chest x-ray of 09/03/2018 has cleared. Electronically Signed   By: Marcello Moores  Register   On: 11/12/2018 10:19   Nm Pulmonary Vent And Perf (v/q Scan)  Result Date: 11/12/2018 CLINICAL DATA:  Shortness of breath.  Back pain. EXAM: NUCLEAR MEDICINE VENTILATION - PERFUSION LUNG SCAN TECHNIQUE: Ventilation images were obtained in multiple projections using inhaled aerosol Tc-24m DTPA. Perfusion images were obtained in multiple projections after intravenous injection of Tc-54m MAA. RADIOPHARMACEUTICALS:  32.2 mCi of Tc-47m DTPA aerosol inhalation and 4.4 mCi Tc67m MAA IV COMPARISON:  CT 09/01/2018. FINDINGS: No significant ventilation or perfusion defects are noted. Negative exam. No evidence of pulmonary embolus. IMPRESSION: Negative exam.  No evidence of pulmonary embolus. Electronically Signed   By: Marcello Moores  Register   On: 11/12/2018 14:17   Initial Impression / Assessment and Plan / ED Course  I have reviewed the triage vital signs and the nursing notes.  Pertinent labs & imaging results that were available during my care of the patient were reviewed by me and considered in my medical decision making (see chart for details).     labWork remarkable for hypokalemia, mild renal insufficiency and therapeutic INR.  Back pain is felt to be muscular in etiology.  Plan Tylenol for pain Chest follow-up with Dr. Moreen Fowler within the next 2 to 3 weeks to get renal function and serum potassium rechecked.  I also advised that she have discussed with Dr. Moreen Fowler regarding documented allergy to contrast dye.  Doubtful.  She denies that contrast dye causes shortness  of breath.  She reports that it causes pain Final Clinical Impressions(s) / ED Diagnoses  Diagnosis #1 low back pain without radiculopathy #2 hypokalemia #3 mild renal  insufficiency Final diagnoses:  None    ED Discharge Orders    None       Orlie Dakin, MD 11/12/18 [phone]

## 2018-11-16 DIAGNOSIS — Z86711 Personal history of pulmonary embolism: Secondary | ICD-10-CM | POA: Diagnosis not present

## 2018-11-16 DIAGNOSIS — Z7901 Long term (current) use of anticoagulants: Secondary | ICD-10-CM | POA: Diagnosis not present

## 2018-11-30 DIAGNOSIS — Z86711 Personal history of pulmonary embolism: Secondary | ICD-10-CM | POA: Diagnosis not present

## 2018-11-30 DIAGNOSIS — Z7901 Long term (current) use of anticoagulants: Secondary | ICD-10-CM | POA: Diagnosis not present

## 2018-12-22 ENCOUNTER — Ambulatory Visit (HOSPITAL_COMMUNITY): Payer: Medicare Other | Admitting: Psychiatry

## 2018-12-28 DIAGNOSIS — Z7901 Long term (current) use of anticoagulants: Secondary | ICD-10-CM | POA: Diagnosis not present

## 2018-12-28 DIAGNOSIS — Z86711 Personal history of pulmonary embolism: Secondary | ICD-10-CM | POA: Diagnosis not present

## 2019-01-11 DIAGNOSIS — Z7901 Long term (current) use of anticoagulants: Secondary | ICD-10-CM | POA: Diagnosis not present

## 2019-01-11 DIAGNOSIS — Z86711 Personal history of pulmonary embolism: Secondary | ICD-10-CM | POA: Diagnosis not present

## 2019-01-25 DIAGNOSIS — M797 Fibromyalgia: Secondary | ICD-10-CM | POA: Diagnosis not present

## 2019-01-25 DIAGNOSIS — I10 Essential (primary) hypertension: Secondary | ICD-10-CM | POA: Diagnosis not present

## 2019-01-25 DIAGNOSIS — Z86711 Personal history of pulmonary embolism: Secondary | ICD-10-CM | POA: Diagnosis not present

## 2019-01-25 DIAGNOSIS — G43909 Migraine, unspecified, not intractable, without status migrainosus: Secondary | ICD-10-CM | POA: Diagnosis not present

## 2019-01-25 DIAGNOSIS — G4700 Insomnia, unspecified: Secondary | ICD-10-CM | POA: Diagnosis not present

## 2019-01-25 DIAGNOSIS — Z7901 Long term (current) use of anticoagulants: Secondary | ICD-10-CM | POA: Diagnosis not present

## 2019-01-25 DIAGNOSIS — M329 Systemic lupus erythematosus, unspecified: Secondary | ICD-10-CM | POA: Diagnosis not present

## 2019-01-25 DIAGNOSIS — Z8673 Personal history of transient ischemic attack (TIA), and cerebral infarction without residual deficits: Secondary | ICD-10-CM | POA: Diagnosis not present

## 2019-01-25 DIAGNOSIS — F418 Other specified anxiety disorders: Secondary | ICD-10-CM | POA: Diagnosis not present

## 2019-01-25 DIAGNOSIS — K5900 Constipation, unspecified: Secondary | ICD-10-CM | POA: Diagnosis not present

## 2019-01-25 DIAGNOSIS — E7800 Pure hypercholesterolemia, unspecified: Secondary | ICD-10-CM | POA: Diagnosis not present

## 2019-01-25 DIAGNOSIS — R790 Abnormal level of blood mineral: Secondary | ICD-10-CM | POA: Diagnosis not present

## 2019-02-11 ENCOUNTER — Other Ambulatory Visit (HOSPITAL_COMMUNITY): Payer: Self-pay | Admitting: Psychiatry

## 2019-02-25 DIAGNOSIS — Z86711 Personal history of pulmonary embolism: Secondary | ICD-10-CM | POA: Diagnosis not present

## 2019-02-25 DIAGNOSIS — Z7901 Long term (current) use of anticoagulants: Secondary | ICD-10-CM | POA: Diagnosis not present

## 2019-02-25 DIAGNOSIS — E7800 Pure hypercholesterolemia, unspecified: Secondary | ICD-10-CM | POA: Diagnosis not present

## 2019-03-11 DIAGNOSIS — R635 Abnormal weight gain: Secondary | ICD-10-CM | POA: Diagnosis not present

## 2019-03-11 DIAGNOSIS — E669 Obesity, unspecified: Secondary | ICD-10-CM | POA: Diagnosis not present

## 2019-03-11 DIAGNOSIS — Z6830 Body mass index (BMI) 30.0-30.9, adult: Secondary | ICD-10-CM | POA: Diagnosis not present

## 2019-03-11 DIAGNOSIS — R5383 Other fatigue: Secondary | ICD-10-CM | POA: Diagnosis not present

## 2019-03-11 DIAGNOSIS — Z79899 Other long term (current) drug therapy: Secondary | ICD-10-CM | POA: Diagnosis not present

## 2019-03-14 ENCOUNTER — Other Ambulatory Visit (HOSPITAL_COMMUNITY): Payer: Self-pay | Admitting: Psychiatry

## 2019-03-14 DIAGNOSIS — M329 Systemic lupus erythematosus, unspecified: Secondary | ICD-10-CM | POA: Diagnosis not present

## 2019-03-14 DIAGNOSIS — N10 Acute tubulo-interstitial nephritis: Secondary | ICD-10-CM | POA: Diagnosis not present

## 2019-03-15 ENCOUNTER — Other Ambulatory Visit (HOSPITAL_COMMUNITY): Payer: Self-pay | Admitting: Psychiatry

## 2019-03-17 ENCOUNTER — Other Ambulatory Visit (HOSPITAL_COMMUNITY): Payer: Self-pay

## 2019-03-17 MED ORDER — ZOLPIDEM TARTRATE ER 12.5 MG PO TBCR: 12.5000 mg | EXTENDED_RELEASE_TABLET | Freq: Every evening | ORAL | 0 refills | Status: DC | PRN

## 2019-03-24 ENCOUNTER — Other Ambulatory Visit (HOSPITAL_COMMUNITY): Payer: Self-pay | Admitting: Psychiatry

## 2019-03-25 DIAGNOSIS — Z86711 Personal history of pulmonary embolism: Secondary | ICD-10-CM | POA: Diagnosis not present

## 2019-03-25 DIAGNOSIS — Z7901 Long term (current) use of anticoagulants: Secondary | ICD-10-CM | POA: Diagnosis not present

## 2019-03-26 ENCOUNTER — Other Ambulatory Visit (HOSPITAL_COMMUNITY): Payer: Self-pay | Admitting: Psychiatry

## 2019-03-31 ENCOUNTER — Other Ambulatory Visit (HOSPITAL_COMMUNITY): Payer: Self-pay

## 2019-03-31 DIAGNOSIS — D485 Neoplasm of uncertain behavior of skin: Secondary | ICD-10-CM | POA: Diagnosis not present

## 2019-03-31 MED ORDER — ALPRAZOLAM 2 MG PO TABS: 2.0000 mg | ORAL_TABLET | Freq: Every evening | ORAL | 0 refills | Status: DC | PRN

## 2019-04-14 ENCOUNTER — Other Ambulatory Visit (HOSPITAL_COMMUNITY): Payer: Self-pay | Admitting: Psychiatry

## 2019-04-18 ENCOUNTER — Telehealth (HOSPITAL_COMMUNITY): Payer: Self-pay

## 2019-04-18 MED ORDER — ZOLPIDEM TARTRATE ER 12.5 MG PO TBCR: 12.5000 mg | EXTENDED_RELEASE_TABLET | Freq: Every evening | ORAL | 0 refills | Status: DC | PRN

## 2019-05-10 DIAGNOSIS — Z7901 Long term (current) use of anticoagulants: Secondary | ICD-10-CM | POA: Diagnosis not present

## 2019-05-10 DIAGNOSIS — Z86711 Personal history of pulmonary embolism: Secondary | ICD-10-CM | POA: Diagnosis not present

## 2019-05-15 ENCOUNTER — Other Ambulatory Visit (HOSPITAL_COMMUNITY): Payer: Self-pay | Admitting: Psychiatry

## 2019-05-18 ENCOUNTER — Other Ambulatory Visit (HOSPITAL_COMMUNITY): Payer: Self-pay

## 2019-05-18 MED ORDER — ZOLPIDEM TARTRATE ER 12.5 MG PO TBCR: 12.5000 mg | EXTENDED_RELEASE_TABLET | Freq: Every evening | ORAL | 2 refills | Status: DC | PRN

## 2019-05-19 ENCOUNTER — Ambulatory Visit (HOSPITAL_COMMUNITY): Payer: Medicare Other | Admitting: Psychiatry

## 2019-05-29 ENCOUNTER — Other Ambulatory Visit (HOSPITAL_COMMUNITY): Payer: Self-pay | Admitting: Psychiatry

## 2019-06-07 DIAGNOSIS — R220 Localized swelling, mass and lump, head: Secondary | ICD-10-CM | POA: Diagnosis not present

## 2019-06-07 DIAGNOSIS — Z7901 Long term (current) use of anticoagulants: Secondary | ICD-10-CM | POA: Diagnosis not present

## 2019-06-08 ENCOUNTER — Other Ambulatory Visit (HOSPITAL_COMMUNITY): Payer: Self-pay | Admitting: Psychiatry

## 2019-06-10 DIAGNOSIS — Z86711 Personal history of pulmonary embolism: Secondary | ICD-10-CM | POA: Diagnosis not present

## 2019-06-10 DIAGNOSIS — Z7901 Long term (current) use of anticoagulants: Secondary | ICD-10-CM | POA: Diagnosis not present

## 2019-06-15 ENCOUNTER — Telehealth (HOSPITAL_COMMUNITY): Payer: Self-pay

## 2019-06-15 NOTE — Telephone Encounter (Signed)
Patient called requesting a refill on her Alprazolam 2mg 

## 2019-06-16 NOTE — H&P (Signed)
HPI:   Lauren Chung is a 53 y.o. female who presents as a new Patient.   Referring Provider: Self, A Referral  Chief complaint: Scalp lesion.  HPI: 55-month history of slightly painful and slowly enlarging left upper scalp mass. She went to a dermatologist and was recommended to come here for surgical resection. She is on anticoagulation therapy for pulmonary embolus. She also suffers with lupus.  PMH/Meds/All/SocHx/FamHx/ROS:   Past Medical History:  Diagnosis Date  . Anxiety  . Depression  . High cholesterol  . Hypertension  . Lupus (systemic lupus erythematosus) ([place])  . Pulmonary embolism [facility])   Past Surgical History:  Procedure Laterality Date  . ABDOMINAL SURGERY   No family history of bleeding disorders, wound healing problems or difficulty with anesthesia.   Social History   Socioeconomic History  . Marital status: Not on file  Spouse name: Not on file  . Number of children: Not on file  . Years of education: Not on file  . Highest education level: Not on file  Occupational History  . Not on file  Social Needs  . Financial resource strain: Not on file  . Food insecurity  Worry: Not on file  Inability: Not on file  . Transportation needs  Medical: Not on file  Non-medical: Not on file  Tobacco Use  . Smoking status: Never Smoker  . Smokeless tobacco: Never Used  Substance and Sexual Activity  . Alcohol use: Not on file  . Drug use: Not on file  . Sexual activity: Not on file  Lifestyle  . Physical activity  Days per week: Not on file  Minutes per session: Not on file  . Stress: Not on file  Relationships  . Social Medical illustrator on phone: Not on file  Gets together: Not on file  Attends religious service: Not on file  Active member of club or organization: Not on file  Attends meetings of clubs or organizations: Not on file  Relationship status: Not on file  Other Topics Concern  . Not on file  Social History Narrative  . Not on file    Current Outpatient Medications:  . desipramine (NORPRAMIN) 25 MG tablet, Take 50 mg by mouth 2 times daily., Disp: , Rfl:  . hydroxychloroquine (PLAQUENIL) 200 mg tablet, TAKE 1 TABLET BY MOUTH EVERY DAY, Disp: 90 tablet, Rfl: 0 . potassium chloride (MICRO-K) 10 MEQ SR capsule, TAKE 2 CAPSULES BY MOUTH WITH FOOD ONCE A DAY, Disp: , Rfl:  . rosuvastatin (CRESTOR) 10 MG tablet, TAKE 1 TABLET BY MOUTH EVERY DAY, Disp: , Rfl:  . senna (SENOKOT) 8.6 mg tablet, Take 1 tablet by mouth daily., Disp: , Rfl:  . warfarin (COUMADIN) 3 MG tablet *ANTICOAGULANT*, TAKE 1 TABLET BY MOUTH EVERY DAY EXCEPT ON TUESDAY AND THURSDAY TAKE 2MG , Disp: , Rfl:  . zolpidem (AMBIEN CR) 12.5 MG CR tablet, Take 12.5 mg by mouth nightly as needed for Sleep., Disp: , Rfl:  . ALPRAZolam (XANAX) 2 MG tablet, Take 2 mg by mouth nightly as needed for Sleep., Disp: , Rfl:  . ibuprofen (ADVIL,MOTRIN) 800 MG tablet, Take 800 mg by mouth every 6 (six) hours as needed., Disp: , Rfl:   A complete ROS was performed with pertinent positives/negatives noted in the HPI. The remainder of the ROS are negative.   Physical Exam:   Ht 1.727 m (5\' 8" )  Wt 83.9 kg (185 lb)  BMI 28.13 kg/m   General: Healthy and alert, in no distress, breathing easily. Normal  affect. In a pleasant mood. Head: Normocephalic, atraumatic. 2-3 cm subcutaneous cystic mass upper parietal scalp left of midline. Eyes: Pupils are equal, and reactive to light. Vision is grossly intact. No spontaneous or gaze nystagmus. Ears: Ear canals are clear. Tympanic membranes are intact, with normal landmarks and the middle ears are clear and healthy. Hearing: Grossly normal. Nose: Nasal cavities are clear with healthy mucosa, no polyps or exudate. Airways are patent. Face: No masses or scars, facial nerve function is symmetric. Oral Cavity: No mucosal abnormalities are noted. Tongue with normal mobility. Significant dental disease. Oropharynx: Tonsils are symmetric. There  are no mucosal masses identified. Tongue base appears normal and healthy. Larynx/Hypopharynx: deferred Chest: Deferred Neck: No palpable masses, no cervical adenopathy, no thyroid nodules or enlargement. Neuro: Cranial nerves II-XII with normal function. Balance: Normal gate. Other findings: none.  Independent Review of Additional Tests or Records:  none  Procedures:  none  Impression & Plans:  Scalp mass, most likely epidermoid cyst. Recommend surgical excision under anesthesia due to her anxiety. We will have to stop her anticoagulation and get clearance by her medical doctor.  Significant dental disease, recommend she see a dentist but she is fearful of dentists.

## 2019-06-21 ENCOUNTER — Inpatient Hospital Stay (HOSPITAL_COMMUNITY)
Admission: RE | Admit: 2019-06-21 | Discharge: 2019-06-21 | Disposition: A | Discharge status: Home / Self Care | Payer: Medicare Other | Source: Ambulatory Visit

## 2019-06-21 ENCOUNTER — Other Ambulatory Visit (HOSPITAL_COMMUNITY)
Admission: RE | Admit: 2019-06-21 | Discharge: 2019-06-21 | Disposition: A | Discharge status: Home / Self Care | Payer: Medicare Other | Source: Ambulatory Visit | Attending: Otolaryngology | Admitting: Otolaryngology

## 2019-06-21 DIAGNOSIS — Z20828 Contact with and (suspected) exposure to other viral communicable diseases: Secondary | ICD-10-CM | POA: Diagnosis not present

## 2019-06-21 DIAGNOSIS — Z01812 Encounter for preprocedural laboratory examination: Secondary | ICD-10-CM | POA: Diagnosis not present

## 2019-06-21 LAB — SARS CORONAVIRUS 2 (TAT 6-24 HRS): SARS Coronavirus 2: NEGATIVE

## 2019-06-21 NOTE — Pre-Procedure Instructions (Signed)
Lauren Chung  06/21/2019     Your procedure is scheduled on Friday, August 21.  Report to [facility], Main Entrance or Entrance "A" at 5:30 AM                  Your surgery or procedure is scheduled for 7:30 A.M.   Call this number if you have problems the morning of surgery: [phone]  This is the number for the Pre- Surgical Desk.               For any other questions, please call [phone], Monday - Friday 8 AM - 4 PM.    Remember:  Do not eat or drink after midnight Thursday, August 20.                        Take these medicines the morning of surgery with A SIP OF WATER:               desipramine (NORPRAMIN)               linaclotide (LINZESS)               rosuvastatin (CRESTOR)  Follow Dr. Janeice Robinson instructions regarding Warfarin (COUMADIN)   STOP/Don't Start taking Aspirin, Aspirin Products (Goody Powder, Excedrin Migraine), Ibuprofen (Advil), Naproxen (Aleve), Vitamins and Herbal Products (ie Fish Oil).   Special instructions:  Lauren Chung- Preparing For Surgery  Before surgery, you can play an important role. Because skin is not sterile, your skin needs to be as free of germs as possible. You can reduce the number of germs on your skin by washing with CHG (chlorahexidine gluconate) Soap before surgery.  CHG is an antiseptic cleaner which kills germs and bonds with the skin to continue killing germs even after washing.    Oral Hygiene is also important to reduce your risk of infection.  Remember - BRUSH YOUR TEETH THE MORNING OF SURGERY WITH YOUR REGULAR TOOTHPASTE  Please do not use if you have an allergy to CHG or antibacterial soaps. If your skin becomes reddened/irritated stop using the CHG.  Do not shave (including legs and underarms) for at least 48 hours prior to first CHG shower. It is OK to shave your face.  Please follow these instructions carefully.   1. Shower the NIGHT BEFORE SURGERY and the MORNING OF SURGERY with CHG.   2. If you  chose to wash your hair, wash your hair first as usual with your normal shampoo.  3. After you shampoo wash your face and private area with the soap you use at home, then rinse your hair and body thoroughly to remove the shampoo and soap. 4.  5. Use CHG as you would any other liquid soap. You can apply CHG directly to the skin and wash gently with a scrungie or a clean washcloth.   6. Apply the CHG Soap to your body ONLY FROM THE NECK DOWN.  Do not use on open wounds or open sores. Avoid contact with your eyes, ears, mouth and genitals (private parts).    7. Wash thoroughly, paying special attention to the area where your surgery will be performed.  8. Thoroughly rinse your body with warm water from the neck down.  9. DO NOT shower/wash with your normal soap after using and rinsing off the CHG Soap.  10. Pat yourself dry with a CLEAN TOWEL.  11. Wear CLEAN PAJAMAS to bed the night before surgery,  wear comfortable clothes the morning of surgery  12. Place CLEAN SHEETS on your bed the night of your first shower and DO NOT SLEEP WITH PETS.  Day of Surgery: Shower as instructed above. Do not wear lotions, powders, or perfumes, or deodorant. Please wear clean clothes to the hospital/surgery center.   Remember to brush your teeth WITH YOUR REGULAR TOOTHPASTE  Do not wear jewelry, make-up or nail polish.  Do not shave 48 hours prior to surgery.  Men may shave face and neck.  Do not bring valuables to the hospital.  [facility] is not responsible for any belongings or valuables.  Contacts, dentures or bridgework may not be worn into surgery.  Leave your suitcase in the car.  After surgery it may be brought to your room.  For patients admitted to the hospital, discharge time will be determined by your treatment team.  Patients discharged the day of surgery will not be allowed to drive home.   Please read over the following fact sheets that you were given: Pain Management, Coughing and  Deep Breathing, Surgical Site Infections

## 2019-06-22 ENCOUNTER — Encounter (HOSPITAL_COMMUNITY)
Admission: RE | Admit: 2019-06-22 | Discharge: 2019-06-22 | Disposition: A | Discharge status: Home / Self Care | Payer: Medicare Other | Source: Ambulatory Visit | Attending: Otolaryngology | Admitting: Otolaryngology

## 2019-06-22 ENCOUNTER — Encounter (HOSPITAL_COMMUNITY): Payer: Self-pay

## 2019-06-22 ENCOUNTER — Other Ambulatory Visit: Payer: Self-pay

## 2019-06-22 DIAGNOSIS — Z01812 Encounter for preprocedural laboratory examination: Secondary | ICD-10-CM | POA: Insufficient documentation

## 2019-06-22 HISTORY — DX: Hyperlipidemia, unspecified: E78.5

## 2019-06-22 HISTORY — DX: Personal history of other medical treatment: Z92.89

## 2019-06-22 HISTORY — DX: Essential (primary) hypertension: I10

## 2019-06-22 HISTORY — DX: Pneumonia, unspecified organism: J18.9

## 2019-06-22 HISTORY — DX: Anemia, unspecified: D64.9

## 2019-06-22 LAB — BASIC METABOLIC PANEL
Anion gap: 12 (ref 5–15)
BUN: 12 mg/dL (ref 6–20)
CO2: 30 mmol/L (ref 22–32)
Calcium: 9.2 mg/dL (ref 8.9–10.3)
Chloride: 94 mmol/L — ABNORMAL LOW (ref 98–111)
Creatinine, Ser: 1.56 mg/dL — ABNORMAL HIGH (ref 0.44–1.00)
GFR calc Af Amer: 44 mL/min — ABNORMAL LOW (ref >60)
GFR calc non Af Amer: 38 mL/min — ABNORMAL LOW (ref >60)
Glucose, Bld: 70 mg/dL (ref 70–99)
Potassium: 2.3 mmol/L — CL (ref 3.5–5.1)
Sodium: 136 mmol/L (ref 135–145)

## 2019-06-22 LAB — CBC
HCT: 35.6 % — ABNORMAL LOW (ref 36.0–46.0)
Hemoglobin: 10.9 g/dL — ABNORMAL LOW (ref 12.0–15.0)
MCH: 24.8 pg — ABNORMAL LOW (ref 26.0–34.0)
MCHC: 30.6 g/dL (ref 30.0–36.0)
MCV: 81.1 fL (ref 80.0–100.0)
Platelets: 424 10*3/uL — ABNORMAL HIGH (ref 150–400)
RBC: 4.39 MIL/uL (ref 3.87–5.11)
RDW: 17.1 % — ABNORMAL HIGH (ref 11.5–15.5)
WBC: 11 10*3/uL — ABNORMAL HIGH (ref 4.0–10.5)
nRBC: 0 % (ref 0.0–0.2)

## 2019-06-22 NOTE — Progress Notes (Signed)
Ester, RN informed me of critical lab value on patient for low potasium - 2.3 (low).  Patient it taking potassium supplement K-Dur 10 mEq bid.  Tried to call MD but got message to call answering service at [phone].  Waited on hold for 7-8 minutes.  Called MD on cell number. Informed him of critical value of 2.3.  Verified critical value of 2.3.  MD will address this tomorrow, may have BMP lab redrawn and/or may need to cancel surgery.  Chart to anesthesia for review.

## 2019-06-22 NOTE — Progress Notes (Signed)
Patient denies shortness of breath, fever, cough and chest pain at PAT appointment  PCP - Dr Antony Contras  Cardiologist - Denies  Chest x-ray - 11/12/18 EKG - 11/14/18 Stress Test - Denies ECHO - Denies Cardiac Cath - Denies  Blood Thinner Instructions: Spoke with Traci at Dr Janeice Robinson office.  She will call patient with coumadin instructions prior to surgery. Patient to follow MD instructions for coumadin.  Anesthesia review: Yes  7 days prior to surgery STOP taking any Aspirin (unless otherwise instructed by your surgeon), Aleve, Naproxen, Ibuprofen, Motrin, Advil, Goody's, BC's, all herbal medications, fish oil, and all vitamins.   Coronavirus Screening Have you or your daughter experienced the following symptoms:  Cough yes/no: No Fever (>100.43F)  yes/no: No Runny nose yes/no: No Sore throat yes/no: No Difficulty breathing/shortness of breath  yes/no: No  Have you or your daughter traveled in the last 14 days and where? yes/no: No

## 2019-06-23 ENCOUNTER — Encounter (HOSPITAL_COMMUNITY): Payer: Self-pay | Admitting: Physician Assistant

## 2019-06-24 ENCOUNTER — Ambulatory Visit (HOSPITAL_COMMUNITY): Admission: RE | Admit: 2019-06-24 | Payer: Medicare Other | Source: Home / Self Care | Admitting: Otolaryngology

## 2019-06-24 ENCOUNTER — Encounter (HOSPITAL_COMMUNITY): Admission: RE | Payer: Self-pay | Source: Home / Self Care

## 2019-06-24 SURGERY — EXCISION, MASS, HEAD
Anesthesia: General

## 2019-06-30 ENCOUNTER — Other Ambulatory Visit (HOSPITAL_COMMUNITY): Payer: Self-pay

## 2019-06-30 MED ORDER — DESIPRAMINE HCL 50 MG PO TABS: 100.0000 mg | ORAL_TABLET | Freq: Every day | ORAL | 0 refills | Status: DC

## 2019-07-08 ENCOUNTER — Other Ambulatory Visit: Payer: Self-pay

## 2019-07-08 ENCOUNTER — Ambulatory Visit (INDEPENDENT_AMBULATORY_CARE_PROVIDER_SITE_OTHER): Payer: Medicare Other | Admitting: Psychiatry

## 2019-07-08 DIAGNOSIS — Z86711 Personal history of pulmonary embolism: Secondary | ICD-10-CM | POA: Diagnosis not present

## 2019-07-08 DIAGNOSIS — F3341 Major depressive disorder, recurrent, in partial remission: Secondary | ICD-10-CM | POA: Diagnosis not present

## 2019-07-08 DIAGNOSIS — Z7901 Long term (current) use of anticoagulants: Secondary | ICD-10-CM | POA: Diagnosis not present

## 2019-07-08 MED ORDER — DESIPRAMINE HCL 50 MG PO TABS: 100.0000 mg | ORAL_TABLET | Freq: Every day | ORAL | 0 refills | Status: DC

## 2019-07-08 MED ORDER — ALPRAZOLAM 2 MG PO TABS: 2.0000 mg | ORAL_TABLET | Freq: Every day | ORAL | 0 refills | Status: DC

## 2019-07-08 MED ORDER — ZOLPIDEM TARTRATE ER 12.5 MG PO TBCR: 12.5000 mg | EXTENDED_RELEASE_TABLET | Freq: Every day | ORAL | 1 refills | Status: DC

## 2019-07-08 NOTE — Progress Notes (Signed)
Psychiatric Initial Adult Assessment   Patient Identification: Lauren Chung MRN:  [id number] Date of Evaluation:  07/08/2019 Referral Source [facility] Officer Chief Complaint:    Visit Diagnosis: Major depression At this time the patient is not doing very well.  She feels more isolated and alone.  I believe her depression is somewhat worse.  She chronically feels somewhat dysphoric but she seems to be more isolated and I suspect it might be related to the virus.  She has not been seen in almost 10 months.  On her last visit she actually showed improvement only 100 mg of desipramine.  She takes a fixed dose of Ambien and Xanax which helps her sleep a great deal.  She is eating well and has a reasonable amount of energy.  She does enjoy the TV and her dogs and she enjoys cooking for her autistic son who she takes care of.  Patient is could have a number of medical procedures.  She apparently has a benign scalp lesion and is could have surgery.  Patient has lupus which seems to be fairly well controlled.  Today the patient is not seeing her baseline.  She is not suicidal.  She continues to function well but she is isolated and not getting out of her house very much.  She does appear more withdrawn.  She is not suicidal.  She drinks no alcohol uses no drugs. (Hypo) Manic Symptoms:   Anxiety Symptoms:   Psychotic Symptoms:   PTSD Symptoms:   Past Psychiatric History: Past therapy, one psychiatric hospitalization  Previous Psychotropic Medications: Yes   Substance Abuse History in the last 12 months:  No.  Consequences of Substance Abuse: Negative  Past Medical History:  Past Medical History:  Diagnosis Date  . Anemia   . Anxiety   . Depression   . History of blood transfusion    in [place]  . Hyperlipidemia   . Hypertension   . Lupus ([place])   . PE (pulmonary embolism)   . Pneumonia    x 3 - last time 2017  . PTSD (post-traumatic stress disorder)   . Stroke [facility])    many years ago per  patient, no deficits  . SVD (spontaneous vaginal delivery)    x 3  . TIA (transient ischemic attack)    many years ago per patient, no deficits    Past Surgical History:  Procedure Laterality Date  . ABDOMINAL SURGERY    . ABLATION     gyn procedure for bleeding  . cyst removed     right leg with sedation/anesthesia    Family Psychiatric History:   Family History:  Family History  Problem Relation Age of Onset  . Leukemia Father   . Seizures Daughter   . Drug abuse Mother   . Alcohol abuse Mother     Social History:   Social History   Socioeconomic History  . Marital status: Legally Separated    Spouse name: Not on file  . Number of children: Not on file  . Years of education: Not on file  . Highest education level: Not on file  Occupational History  . Not on file  Social Needs  . Financial resource strain: Not on file  . Food insecurity    Worry: Not on file    Inability: Not on file  . Transportation needs    Medical: Not on file    Non-medical: Not on file  Tobacco Use  . Smoking status: Never Smoker  . Smokeless  tobacco: Never Used  Substance and Sexual Activity  . Alcohol use: No  . Drug use: No  . Sexual activity: Yes    Partners: Male    Birth control/protection: Condom, Post-menopausal  Lifestyle  . Physical activity    Days per week: Not on file    Minutes per session: Not on file  . Stress: Not on file  Relationships  . Social Herbalist on phone: Not on file    Gets together: Not on file    Attends religious service: Not on file    Active member of club or organization: Not on file    Attends meetings of clubs or organizations: Not on file    Relationship status: Not on file  Other Topics Concern  . Not on file  Social History Narrative  . Not on file    Additional Social History:   Allergies:   Allergies  Allergen Reactions  . Codeine Shortness Of Breath  . Contrast Media [Iodinated Diagnostic Agents] Shortness Of  Breath  . Tylenol [Acetaminophen] Nausea And Vomiting    Metabolic Disorder Labs: No results found for: HGBA1C, MPG No results found for: PROLACTIN No results found for: CHOL, TRIG, HDL, CHOLHDL, VLDL, LDLCALC   Current Medications: Current Outpatient Medications  Medication Sig Dispense Refill  . alprazolam (XANAX) 2 MG tablet Take 1 tablet (2 mg total) by mouth at bedtime. 90 tablet 0  . desipramine (NORPRAMIN) 50 MG tablet Take 2 tablets (100 mg total) by mouth daily. 180 tablet 0  . ibuprofen (ADVIL) 200 MG tablet Take 400-600 mg by mouth daily as needed for headache or moderate pain.    Marland Kitchen linaclotide (LINZESS) 72 MCG capsule Take 72 mcg by mouth daily as needed (constipation).     . potassium chloride (K-DUR) 10 MEQ tablet Take 10 mEq by mouth 2 (two) times daily.    . rosuvastatin (CRESTOR) 10 MG tablet Take 10 mg by mouth 2 (two) times daily.     Marland Kitchen triamterene-hydrochlorothiazide (MAXZIDE-25) 37.5-25 MG tablet Take 1 tablet by mouth daily.    Marland Kitchen warfarin (COUMADIN) 3 MG tablet Take 3 mg by mouth daily.     Marland Kitchen zolpidem (AMBIEN CR) 12.5 MG CR tablet Take 1 tablet (12.5 mg total) by mouth at bedtime. 90 tablet 1   No current facility-administered medications for this visit.     Neurologic: Headache: No Seizure: No Paresthesias:No  Musculoskeletal: Strength & Muscle Tone: within normal limits Gait & Station: normal Patient leans: N/A  Psychiatric Specialty Exam: ROS  There were no vitals taken for this visit.There is no height or weight on file to calculate BMI.  General Appearance: Casual  Eye Contact:  Good  Speech:  Clear and Coherent  Volume:  Normal  Mood:  Dysphoric  Affect:  Appropriate  Thought Process:  Goal Directed  Orientation:  NA  Thought Content:  Logical  Suicidal Thoughts:  No  Homicidal Thoughts:  No  Memory:  NA  Judgement:  Good  Insight:  Good  Psychomotor Activity:  Normal  Concentration:    Recall:  Miller of Knowledge:Fair   Language: Good  Akathisia:  No  Handed:  Right  AIMS (if indicated):    Assets:  Desire for Improvement  ADL's:  Intact  Cognition: WNL  Sleep:      Treatment Plan Summary: 9/4/202010:36 AM   This patient's major problem is that of major depression.  She takes desipramine 100 mg but at this time  seems to be having a decline.  Next week we will go ahead and get a desipramine blood level.  We will also get a comprehensive metabolic panel and a TSH at that time.  The patient will continue taking Xanax 2 mg at night and Ambien 12.5 CR for her second problem that of insomnia.  The patient will be seen again in 7 weeks.  However once we get her desipramine blood level will make some determinations what we should do with the visit.  Consider possibly adding another medicine.  I will review all her blood work.

## 2019-07-23 NOTE — H&P (Signed)
HPI:   Lauren Chung is a 52 y.o. female who presents as a new Patient.   Referring Provider: Self, A Referral  Chief complaint: Scalp lesion.  HPI: 6-month history of slightly painful and slowly enlarging left upper scalp mass. She went to a dermatologist and was recommended to come here for surgical resection. She is on anticoagulation therapy for pulmonary embolus. She also suffers with lupus.  PMH/Meds/All/SocHx/FamHx/ROS:   Past Medical History:  Diagnosis Date  . Anxiety  . Depression  . High cholesterol  . Hypertension  . Lupus (systemic lupus erythematosus) ([facility])  . Pulmonary embolism ([facility])   Past Surgical History:  Procedure Laterality Date  . ABDOMINAL SURGERY   No family history of bleeding disorders, wound healing problems or difficulty with anesthesia.   Social History   Socioeconomic History  . Marital status: Not on file  Spouse name: Not on file  . Number of children: Not on file  . Years of education: Not on file  . Highest education level: Not on file  Occupational History  . Not on file  Social Needs  . Financial resource strain: Not on file  . Food insecurity  Worry: Not on file  Inability: Not on file  . Transportation needs  Medical: Not on file  Non-medical: Not on file  Tobacco Use  . Smoking status: Never Smoker  . Smokeless tobacco: Never Used  Substance and Sexual Activity  . Alcohol use: Not on file  . Drug use: Not on file  . Sexual activity: Not on file  Lifestyle  . Physical activity  Days per week: Not on file  Minutes per session: Not on file  . Stress: Not on file  Relationships  . Social connections  Talks on phone: Not on file  Gets together: Not on file  Attends religious service: Not on file  Active member of club or organization: Not on file  Attends meetings of clubs or organizations: Not on file  Relationship status: Not on file  Other Topics Concern  . Not on file  Social History Narrative  . Not on file    Current Outpatient Medications:  . desipramine (NORPRAMIN) 25 MG tablet, Take 50 mg by mouth 2 times daily., Disp: , Rfl:  . hydroxychloroquine (PLAQUENIL) 200 mg tablet, TAKE 1 TABLET BY MOUTH EVERY DAY, Disp: 90 tablet, Rfl: 0 . potassium chloride (MICRO-K) 10 MEQ SR capsule, TAKE 2 CAPSULES BY MOUTH WITH FOOD ONCE A DAY, Disp: , Rfl:  . rosuvastatin (CRESTOR) 10 MG tablet, TAKE 1 TABLET BY MOUTH EVERY DAY, Disp: , Rfl:  . senna (SENOKOT) 8.6 mg tablet, Take 1 tablet by mouth daily., Disp: , Rfl:  . warfarin (COUMADIN) 3 MG tablet *ANTICOAGULANT*, TAKE 1 TABLET BY MOUTH EVERY DAY EXCEPT ON TUESDAY AND THURSDAY TAKE 2MG, Disp: , Rfl:  . zolpidem (AMBIEN CR) 12.5 MG CR tablet, Take 12.5 mg by mouth nightly as needed for Sleep., Disp: , Rfl:  . ALPRAZolam (XANAX) 2 MG tablet, Take 2 mg by mouth nightly as needed for Sleep., Disp: , Rfl:  . ibuprofen (ADVIL,MOTRIN) 800 MG tablet, Take 800 mg by mouth every 6 (six) hours as needed., Disp: , Rfl:   A complete ROS was performed with pertinent positives/negatives noted in the HPI. The remainder of the ROS are negative.   Physical Exam:   Ht 1.727 m (5' 8")  Wt 83.9 kg (185 lb)  BMI 28.13 kg/m   General: Healthy and alert, in no distress, breathing easily. Normal   affect. In a pleasant mood. Head: Normocephalic, atraumatic. 2-3 cm subcutaneous cystic mass upper parietal scalp left of midline. Eyes: Pupils are equal, and reactive to light. Vision is grossly intact. No spontaneous or gaze nystagmus. Ears: Ear canals are clear. Tympanic membranes are intact, with normal landmarks and the middle ears are clear and healthy. Hearing: Grossly normal. Nose: Nasal cavities are clear with healthy mucosa, no polyps or exudate. Airways are patent. Face: No masses or scars, facial nerve function is symmetric. Oral Cavity: No mucosal abnormalities are noted. Tongue with normal mobility. Significant dental disease. Oropharynx: Tonsils are symmetric. There  are no mucosal masses identified. Tongue base appears normal and healthy. Larynx/Hypopharynx: deferred Chest: Deferred Neck: No palpable masses, no cervical adenopathy, no thyroid nodules or enlargement. Neuro: Cranial nerves II-XII with normal function. Balance: Normal gate. Other findings: none.  Independent Review of Additional Tests or Records:  none  Procedures:  none  Impression & Plans:  Scalp mass, most likely epidermoid cyst. Recommend surgical excision under anesthesia due to her anxiety. We will have to stop her anticoagulation and get clearance by her medical doctor.  Significant dental disease, recommend she see a dentist but she is fearful of dentists.  

## 2019-07-28 ENCOUNTER — Other Ambulatory Visit: Payer: Self-pay

## 2019-07-28 ENCOUNTER — Encounter (HOSPITAL_BASED_OUTPATIENT_CLINIC_OR_DEPARTMENT_OTHER): Payer: Self-pay

## 2019-07-29 DIAGNOSIS — Z86711 Personal history of pulmonary embolism: Secondary | ICD-10-CM | POA: Diagnosis not present

## 2019-07-29 DIAGNOSIS — F418 Other specified anxiety disorders: Secondary | ICD-10-CM | POA: Diagnosis not present

## 2019-07-29 DIAGNOSIS — M329 Systemic lupus erythematosus, unspecified: Secondary | ICD-10-CM | POA: Diagnosis not present

## 2019-07-29 DIAGNOSIS — Z7901 Long term (current) use of anticoagulants: Secondary | ICD-10-CM | POA: Diagnosis not present

## 2019-07-29 DIAGNOSIS — K5900 Constipation, unspecified: Secondary | ICD-10-CM | POA: Diagnosis not present

## 2019-07-29 DIAGNOSIS — Z8673 Personal history of transient ischemic attack (TIA), and cerebral infarction without residual deficits: Secondary | ICD-10-CM | POA: Diagnosis not present

## 2019-07-29 DIAGNOSIS — G43909 Migraine, unspecified, not intractable, without status migrainosus: Secondary | ICD-10-CM | POA: Diagnosis not present

## 2019-07-29 DIAGNOSIS — G4700 Insomnia, unspecified: Secondary | ICD-10-CM | POA: Diagnosis not present

## 2019-07-29 DIAGNOSIS — E7800 Pure hypercholesterolemia, unspecified: Secondary | ICD-10-CM | POA: Diagnosis not present

## 2019-07-29 DIAGNOSIS — M797 Fibromyalgia: Secondary | ICD-10-CM | POA: Diagnosis not present

## 2019-07-29 DIAGNOSIS — I10 Essential (primary) hypertension: Secondary | ICD-10-CM | POA: Diagnosis not present

## 2019-07-29 DIAGNOSIS — Z0000 Encounter for general adult medical examination without abnormal findings: Secondary | ICD-10-CM | POA: Diagnosis not present

## 2019-08-02 ENCOUNTER — Other Ambulatory Visit (HOSPITAL_COMMUNITY): Payer: Self-pay

## 2019-08-02 ENCOUNTER — Other Ambulatory Visit (HOSPITAL_COMMUNITY)
Admission: RE | Admit: 2019-08-02 | Discharge: 2019-08-02 | Disposition: A | Discharge status: Home / Self Care | Payer: Medicare Other | Source: Ambulatory Visit | Attending: Otolaryngology | Admitting: Otolaryngology

## 2019-08-02 DIAGNOSIS — Z01812 Encounter for preprocedural laboratory examination: Secondary | ICD-10-CM | POA: Insufficient documentation

## 2019-08-02 DIAGNOSIS — Z79899 Other long term (current) drug therapy: Secondary | ICD-10-CM

## 2019-08-02 DIAGNOSIS — Z20828 Contact with and (suspected) exposure to other viral communicable diseases: Secondary | ICD-10-CM | POA: Insufficient documentation

## 2019-08-03 ENCOUNTER — Other Ambulatory Visit: Payer: Self-pay | Admitting: Family Medicine

## 2019-08-03 ENCOUNTER — Other Ambulatory Visit (HOSPITAL_COMMUNITY): Payer: Self-pay | Admitting: Psychiatry

## 2019-08-03 DIAGNOSIS — Z79899 Other long term (current) drug therapy: Secondary | ICD-10-CM | POA: Diagnosis not present

## 2019-08-03 LAB — NOVEL CORONAVIRUS, NAA (HOSP ORDER, SEND-OUT TO REF LAB; TAT 18-24 HRS): SARS-CoV-2, NAA: NOT DETECTED

## 2019-08-04 ENCOUNTER — Other Ambulatory Visit: Payer: Self-pay

## 2019-08-04 ENCOUNTER — Encounter (HOSPITAL_BASED_OUTPATIENT_CLINIC_OR_DEPARTMENT_OTHER)
Admission: RE | Admit: 2019-08-04 | Discharge: 2019-08-04 | Disposition: A | Discharge status: Home / Self Care | Payer: Medicare Other | Source: Ambulatory Visit | Attending: Otolaryngology | Admitting: Otolaryngology

## 2019-08-04 ENCOUNTER — Other Ambulatory Visit: Payer: Self-pay | Admitting: Family Medicine

## 2019-08-04 DIAGNOSIS — I10 Essential (primary) hypertension: Secondary | ICD-10-CM | POA: Diagnosis not present

## 2019-08-04 DIAGNOSIS — Z1231 Encounter for screening mammogram for malignant neoplasm of breast: Secondary | ICD-10-CM

## 2019-08-04 DIAGNOSIS — D234 Other benign neoplasm of skin of scalp and neck: Secondary | ICD-10-CM | POA: Diagnosis not present

## 2019-08-04 DIAGNOSIS — Z1211 Encounter for screening for malignant neoplasm of colon: Secondary | ICD-10-CM | POA: Diagnosis not present

## 2019-08-04 DIAGNOSIS — Z7901 Long term (current) use of anticoagulants: Secondary | ICD-10-CM | POA: Diagnosis not present

## 2019-08-04 DIAGNOSIS — Z79899 Other long term (current) drug therapy: Secondary | ICD-10-CM | POA: Diagnosis not present

## 2019-08-04 DIAGNOSIS — Z86711 Personal history of pulmonary embolism: Secondary | ICD-10-CM | POA: Diagnosis not present

## 2019-08-04 DIAGNOSIS — F329 Major depressive disorder, single episode, unspecified: Secondary | ICD-10-CM | POA: Diagnosis not present

## 2019-08-04 DIAGNOSIS — D759 Disease of blood and blood-forming organs, unspecified: Secondary | ICD-10-CM | POA: Diagnosis not present

## 2019-08-04 DIAGNOSIS — M329 Systemic lupus erythematosus, unspecified: Secondary | ICD-10-CM | POA: Diagnosis not present

## 2019-08-04 DIAGNOSIS — F419 Anxiety disorder, unspecified: Secondary | ICD-10-CM | POA: Diagnosis not present

## 2019-08-04 DIAGNOSIS — E7800 Pure hypercholesterolemia, unspecified: Secondary | ICD-10-CM | POA: Diagnosis not present

## 2019-08-04 LAB — BASIC METABOLIC PANEL
Anion gap: 10 (ref 5–15)
BUN: 5 mg/dL — ABNORMAL LOW (ref 6–20)
CO2: 26 mmol/L (ref 22–32)
Calcium: 8.4 mg/dL — ABNORMAL LOW (ref 8.9–10.3)
Chloride: 99 mmol/L (ref 98–111)
Creatinine, Ser: 1.52 mg/dL — ABNORMAL HIGH (ref 0.44–1.00)
GFR calc Af Amer: 45 mL/min — ABNORMAL LOW (ref >60)
GFR calc non Af Amer: 39 mL/min — ABNORMAL LOW (ref >60)
Glucose, Bld: 76 mg/dL (ref 70–99)
Potassium: 3 mmol/L — ABNORMAL LOW (ref 3.5–5.1)
Sodium: 135 mmol/L (ref 135–145)

## 2019-08-04 LAB — PROTIME-INR
INR: 1.2 (ref 0.8–1.2)
Prothrombin Time: 14.8 seconds (ref 11.4–15.2)

## 2019-08-05 ENCOUNTER — Encounter (HOSPITAL_BASED_OUTPATIENT_CLINIC_OR_DEPARTMENT_OTHER): Payer: Self-pay | Admitting: Anesthesiology

## 2019-08-05 ENCOUNTER — Ambulatory Visit (HOSPITAL_COMMUNITY)
Admission: RE | Admit: 2019-08-05 | Discharge: 2019-08-05 | Disposition: A | Discharge status: Home / Self Care | Payer: Medicare Other | Attending: Otolaryngology | Admitting: Otolaryngology

## 2019-08-05 ENCOUNTER — Encounter (HOSPITAL_BASED_OUTPATIENT_CLINIC_OR_DEPARTMENT_OTHER)
Admission: RE | Disposition: A | Discharge status: Home / Self Care | Payer: Self-pay | Source: Home / Self Care | Attending: Otolaryngology

## 2019-08-05 ENCOUNTER — Ambulatory Visit (HOSPITAL_BASED_OUTPATIENT_CLINIC_OR_DEPARTMENT_OTHER): Payer: Medicare Other | Admitting: Anesthesiology

## 2019-08-05 DIAGNOSIS — Z79899 Other long term (current) drug therapy: Secondary | ICD-10-CM | POA: Insufficient documentation

## 2019-08-05 DIAGNOSIS — D234 Other benign neoplasm of skin of scalp and neck: Secondary | ICD-10-CM | POA: Insufficient documentation

## 2019-08-05 DIAGNOSIS — D759 Disease of blood and blood-forming organs, unspecified: Secondary | ICD-10-CM | POA: Diagnosis not present

## 2019-08-05 DIAGNOSIS — E7800 Pure hypercholesterolemia, unspecified: Secondary | ICD-10-CM | POA: Insufficient documentation

## 2019-08-05 DIAGNOSIS — F329 Major depressive disorder, single episode, unspecified: Secondary | ICD-10-CM | POA: Insufficient documentation

## 2019-08-05 DIAGNOSIS — R220 Localized swelling, mass and lump, head: Secondary | ICD-10-CM | POA: Diagnosis not present

## 2019-08-05 DIAGNOSIS — Z7901 Long term (current) use of anticoagulants: Secondary | ICD-10-CM | POA: Insufficient documentation

## 2019-08-05 DIAGNOSIS — Z86711 Personal history of pulmonary embolism: Secondary | ICD-10-CM | POA: Insufficient documentation

## 2019-08-05 DIAGNOSIS — F419 Anxiety disorder, unspecified: Secondary | ICD-10-CM | POA: Diagnosis not present

## 2019-08-05 DIAGNOSIS — I10 Essential (primary) hypertension: Secondary | ICD-10-CM | POA: Insufficient documentation

## 2019-08-05 DIAGNOSIS — M329 Systemic lupus erythematosus, unspecified: Secondary | ICD-10-CM | POA: Insufficient documentation

## 2019-08-05 DIAGNOSIS — F332 Major depressive disorder, recurrent severe without psychotic features: Secondary | ICD-10-CM | POA: Diagnosis not present

## 2019-08-05 HISTORY — PX: EXCISION MASS HEAD: SHX6702

## 2019-08-05 LAB — POCT PREGNANCY, URINE: Preg Test, Ur: NEGATIVE

## 2019-08-05 SURGERY — EXCISION, MASS, HEAD
Anesthesia: General | Site: Scalp

## 2019-08-05 MED ORDER — MIDAZOLAM HCL 5 MG/5ML IJ SOLN
INTRAMUSCULAR | Status: DC | PRN
  Administered 2019-08-05: 2 mg via INTRAVENOUS

## 2019-08-05 MED ORDER — FENTANYL CITRATE (PF) 100 MCG/2ML IJ SOLN
25.0000 ug | INTRAMUSCULAR | Status: DC | PRN
  Administered 2019-08-05: 14:00:00 50 ug via INTRAVENOUS

## 2019-08-05 MED ORDER — LIDOCAINE HCL (CARDIAC) PF 100 MG/5ML IV SOSY
PREFILLED_SYRINGE | INTRAVENOUS | Status: DC | PRN
  Administered 2019-08-05: 50 mg via INTRAVENOUS

## 2019-08-05 MED ORDER — ONDANSETRON HCL 4 MG/2ML IJ SOLN
INTRAMUSCULAR | Status: AC
  Filled 2019-08-05: qty 2

## 2019-08-05 MED ORDER — FENTANYL CITRATE (PF) 100 MCG/2ML IJ SOLN
INTRAMUSCULAR | Status: AC
  Filled 2019-08-05: qty 2

## 2019-08-05 MED ORDER — SCOPOLAMINE 1 MG/3DAYS TD PT72: 1.0000 | MEDICATED_PATCH | Freq: Once | TRANSDERMAL | Status: DC

## 2019-08-05 MED ORDER — ONDANSETRON HCL 4 MG/2ML IJ SOLN: 4.0000 mg | Freq: Once | INTRAMUSCULAR | Status: DC | PRN

## 2019-08-05 MED ORDER — LIDOCAINE-EPINEPHRINE 1 %-1:100000 IJ SOLN
INTRAMUSCULAR | Status: DC | PRN
  Administered 2019-08-05: 1 mL

## 2019-08-05 MED ORDER — LIDOCAINE 2% (20 MG/ML) 5 ML SYRINGE
INTRAMUSCULAR | Status: AC
  Filled 2019-08-05: qty 5

## 2019-08-05 MED ORDER — FENTANYL CITRATE (PF) 100 MCG/2ML IJ SOLN
INTRAMUSCULAR | Status: DC | PRN
  Administered 2019-08-05: 50 ug via INTRAVENOUS
  Administered 2019-08-05: 25 ug via INTRAVENOUS

## 2019-08-05 MED ORDER — DEXAMETHASONE SODIUM PHOSPHATE 10 MG/ML IJ SOLN
INTRAMUSCULAR | Status: AC
  Filled 2019-08-05: qty 1

## 2019-08-05 MED ORDER — DEXAMETHASONE SODIUM PHOSPHATE 4 MG/ML IJ SOLN
INTRAMUSCULAR | Status: DC | PRN
  Administered 2019-08-05: 10 mg via INTRAVENOUS

## 2019-08-05 MED ORDER — PROPOFOL 10 MG/ML IV BOLUS
INTRAVENOUS | Status: AC
  Filled 2019-08-05: qty 20

## 2019-08-05 MED ORDER — CEPHALEXIN 500 MG PO CAPS: 500.0000 mg | ORAL_CAPSULE | Freq: Three times a day (TID) | ORAL | 0 refills | Status: DC

## 2019-08-05 MED ORDER — LIDOCAINE-EPINEPHRINE 1 %-1:100000 IJ SOLN
INTRAMUSCULAR | Status: AC
  Filled 2019-08-05: qty 1

## 2019-08-05 MED ORDER — LACTATED RINGERS IV SOLN
INTRAVENOUS | Status: DC
  Administered 2019-08-05: 10:00:00 via INTRAVENOUS

## 2019-08-05 MED ORDER — PROPOFOL 10 MG/ML IV BOLUS
INTRAVENOUS | Status: DC | PRN
  Administered 2019-08-05: 150 mg via INTRAVENOUS

## 2019-08-05 MED ORDER — MIDAZOLAM HCL 2 MG/2ML IJ SOLN
INTRAMUSCULAR | Status: AC
  Filled 2019-08-05: qty 2

## 2019-08-05 MED ORDER — ONDANSETRON HCL 4 MG/2ML IJ SOLN
INTRAMUSCULAR | Status: DC | PRN
  Administered 2019-08-05: 4 mg via INTRAVENOUS

## 2019-08-05 SURGICAL SUPPLY — 62 items
BAND RUBBER #18 3X1/16 STRL (MISCELLANEOUS) IMPLANT
BENZOIN TINCTURE PRP APPL 2/3 (GAUZE/BANDAGES/DRESSINGS) IMPLANT
BLADE SURG 15 STRL LF DISP TIS (BLADE) ×1 IMPLANT
BLADE SURG 15 STRL SS (BLADE) ×2
CANISTER SUCT 1200ML W/VALVE (MISCELLANEOUS) ×3 IMPLANT
CLEANER CAUTERY TIP 5X5 PAD (MISCELLANEOUS) ×1 IMPLANT
CLOSURE WOUND 1/2 X4 (GAUZE/BANDAGES/DRESSINGS)
CORD BIPOLAR FORCEPS 12FT (ELECTRODE) IMPLANT
COVER BACK TABLE REUSABLE LG (DRAPES) ×3 IMPLANT
COVER MAYO STAND REUSABLE (DRAPES) ×3 IMPLANT
COVER WAND RF STERILE (DRAPES) IMPLANT
DERMABOND ADVANCED (GAUZE/BANDAGES/DRESSINGS) ×2
DERMABOND ADVANCED .7 DNX12 (GAUZE/BANDAGES/DRESSINGS) IMPLANT
DRAIN PENROSE 1/4X12 LTX STRL (WOUND CARE) IMPLANT
DRAPE HALF SHEET 70X43 (DRAPES) IMPLANT
DRAPE U-SHAPE 76X120 STRL (DRAPES) ×3 IMPLANT
ELECT COATED BLADE 2.86 ST (ELECTRODE) ×3 IMPLANT
ELECT NDL BLADE 2-5/6 (NEEDLE) IMPLANT
ELECT NEEDLE BLADE 2-5/6 (NEEDLE) IMPLANT
ELECT REM PT RETURN 9FT ADLT (ELECTROSURGICAL) ×3
ELECTRODE REM PT RTRN 9FT ADLT (ELECTROSURGICAL) ×1 IMPLANT
FORCEPS BIPOLAR SPETZLER 8 1.0 (NEUROSURGERY SUPPLIES) IMPLANT
GAUZE 4X4 16PLY RFD (DISPOSABLE) IMPLANT
GAUZE SPONGE 4X4 12PLY STRL LF (GAUZE/BANDAGES/DRESSINGS) IMPLANT
GLOVE BIOGEL PI IND STRL 7.0 (GLOVE) IMPLANT
GLOVE BIOGEL PI INDICATOR 7.0 (GLOVE) ×4
GLOVE ECLIPSE 6.5 STRL STRAW (GLOVE) ×2 IMPLANT
GLOVE ECLIPSE 7.5 STRL STRAW (GLOVE) ×3 IMPLANT
GOWN STRL REUS W/ TWL LRG LVL3 (GOWN DISPOSABLE) ×1 IMPLANT
GOWN STRL REUS W/ TWL XL LVL3 (GOWN DISPOSABLE) ×1 IMPLANT
GOWN STRL REUS W/TWL LRG LVL3 (GOWN DISPOSABLE) ×2
GOWN STRL REUS W/TWL XL LVL3 (GOWN DISPOSABLE) ×2
NDL HYPO 25X1 1.5 SAFETY (NEEDLE) ×1 IMPLANT
NDL PRECISIONGLIDE 27X1.5 (NEEDLE) IMPLANT
NEEDLE HYPO 25X1 1.5 SAFETY (NEEDLE) ×3 IMPLANT
NEEDLE PRECISIONGLIDE 27X1.5 (NEEDLE) IMPLANT
NS IRRIG 1000ML POUR BTL (IV SOLUTION) ×2 IMPLANT
PACK BASIN DAY SURGERY FS (CUSTOM PROCEDURE TRAY) ×3 IMPLANT
PAD CLEANER CAUTERY TIP 5X5 (MISCELLANEOUS)
PENCIL BUTTON HOLSTER BLD 10FT (ELECTRODE) IMPLANT
PENCIL FOOT CONTROL (ELECTRODE) ×3 IMPLANT
SLEEVE SCD COMPRESS KNEE MED (MISCELLANEOUS) ×3 IMPLANT
SPONGE GAUZE 2X2 8PLY STER LF (GAUZE/BANDAGES/DRESSINGS)
SPONGE GAUZE 2X2 8PLY STRL LF (GAUZE/BANDAGES/DRESSINGS) IMPLANT
STAPLER VISISTAT 35W (STAPLE) IMPLANT
STRIP CLOSURE SKIN 1/2X4 (GAUZE/BANDAGES/DRESSINGS) IMPLANT
SUCTION FRAZIER HANDLE 10FR (MISCELLANEOUS)
SUCTION TUBE FRAZIER 10FR DISP (MISCELLANEOUS) IMPLANT
SUT CHROMIC 3 0 PS 2 (SUTURE) ×2 IMPLANT
SUT CHROMIC 4 0 P 3 18 (SUTURE) IMPLANT
SUT ETHILON 4 0 PS 2 18 (SUTURE) IMPLANT
SUT ETHILON 5 0 P 3 18 (SUTURE)
SUT NYLON ETHILON 5-0 P-3 1X18 (SUTURE) IMPLANT
SUT PLAIN 5 0 P 3 18 (SUTURE) IMPLANT
SUT SILK 4 0 TIES 17X18 (SUTURE) IMPLANT
SUT VICRYL 4-0 PS2 18IN ABS (SUTURE) IMPLANT
SYR BULB 3OZ (MISCELLANEOUS) ×2 IMPLANT
SYR CONTROL 10ML LL (SYRINGE) ×3 IMPLANT
TOWEL GREEN STERILE FF (TOWEL DISPOSABLE) ×3 IMPLANT
TRAY DSU PREP LF (CUSTOM PROCEDURE TRAY) ×3 IMPLANT
TUBE CONNECTING 20'X1/4 (TUBING) ×1
TUBE CONNECTING 20X1/4 (TUBING) ×2 IMPLANT

## 2019-08-05 NOTE — Transfer of Care (Signed)
Immediate Anesthesia Transfer of Care Note  Patient: Lauren Chung  Procedure(s) Performed: EXCISION OF SCALP CYST (N/A Scalp)  Patient Location: PACU  Anesthesia Type:General  Level of Consciousness: awake and patient cooperative  Airway & Oxygen Therapy: Patient Spontanous Breathing and Patient connected to face mask oxygen  Post-op Assessment: Report given to RN and Post -op Vital signs reviewed and stable  Post vital signs: Reviewed and stable  Last Vitals:  Vitals Value Taken Time  BP    Temp    Pulse 91 08/05/19 1230  Resp    SpO2 100 % 08/05/19 1230  Vitals shown include unvalidated device data.  Last Pain:  Vitals:   08/05/19 0916  TempSrc: Oral  PainSc: 8       Patients Stated Pain Goal: 6 ([phone] [id number])  Complications: No apparent anesthesia complications

## 2019-08-05 NOTE — Anesthesia Postprocedure Evaluation (Signed)
Anesthesia Post Note  Patient: Azhar Bickford  Procedure(s) Performed: EXCISION OF SCALP CYST (N/A Scalp)     Patient location during evaluation: PACU Anesthesia Type: General Level of consciousness: awake and alert Pain management: pain level controlled Vital Signs Assessment: post-procedure vital signs reviewed and stable Respiratory status: spontaneous breathing, nonlabored ventilation, respiratory function stable and patient connected to nasal cannula oxygen Cardiovascular status: blood pressure returned to baseline and stable Postop Assessment: no apparent nausea or vomiting Anesthetic complications: no    Last Vitals:  Vitals:   08/05/19 1315 08/05/19 1330  BP: 112/79 123/84  Pulse: 87 70  Resp: 13 (!) 24  Temp:    SpO2: 97% 97%    Last Pain:  Vitals:   08/05/19 1330  TempSrc:   PainSc: 4                  Tiajuana Amass

## 2019-08-05 NOTE — Op Note (Signed)
OPERATIVE REPORT  DATE OF SURGERY: 08/05/2019  PATIENT:  Lauren Chung,  53 y.o. female  PRE-OPERATIVE DIAGNOSIS:  scalp mass  POST-OPERATIVE DIAGNOSIS:  scalp mass  PROCEDURE:  Procedure(s): EXCISION OF SCALP CYST  SURGEON:  Beckie Salts, MD  ASSISTANTS: None  ANESTHESIA:   General   EBL: 10 ml  DRAINS: None  LOCAL MEDICATIONS USED: 1% Xylocaine with epinephrine  SPECIMEN: Scalp cyst  COUNTS:  Correct  PROCEDURE DETAILS: The patient was taken to the operating room and placed on the operating table in the supine position. Following induction of general endotracheal anesthesia, the scalp was prepped and draped in a standard fashion.  The cyst was approximately 3-1/2 cm in diameter.  It was at the vertex of the scalp.  An ellipse of skin was outlined with a marking pen approximately 3 cm in length.  1% Xylocaine with epinephrine was infiltrated.  A #15 scalpel was used to incise the skin and sharp dissection was carried down beyond the subcutaneous tissue surrounding the capsule of the cyst.  The cyst was kept intact except during the latter part of the dissection and ruptured.  It was filled with cheesy sebaceous type material.  The entire cyst wall was removed.  Electrocautery was used for hemostasis.  The wound was irrigated thoroughly with saline solution.  The defect was closed using interrupted 3-0 chromic suture on the deep layer securing the skin down to the pericranial tissue to prevent dead space formation.  The skin was closed with a running subcuticular suture.  Dermabond was used on the skin.  The patient was awakened extubated and transferred to recovery in stable condition.  The cyst was sent for pathologic valuation.  It measured approximately 3.5 cm in diameter.    PATIENT DISPOSITION:  To PACU, stable

## 2019-08-05 NOTE — Interval H&P Note (Signed)
History and Physical Interval Note:  08/05/2019 11:40 AM  Lauren Chung  has presented today for surgery, with the diagnosis of scalp mass.  The various methods of treatment have been discussed with the patient and family. After consideration of risks, benefits and other options for treatment, the patient has consented to  Procedure(s): EXCISION OF SCALP CYST (N/A) as a surgical intervention.  The patient's history has been reviewed, patient examined, no change in status, stable for surgery.  I have reviewed the patient's chart and labs.  Questions were answered to the patient's satisfaction.     Izora Gala

## 2019-08-05 NOTE — Anesthesia Procedure Notes (Signed)
Procedure Name: LMA Insertion Performed by: Marrianne Mood, CRNA Pre-anesthesia Checklist: Patient identified, Emergency Drugs available, Suction available, Patient being monitored and Timeout performed Patient Re-evaluated:Patient Re-evaluated prior to induction Oxygen Delivery Method: Circle system utilized Preoxygenation: Pre-oxygenation with 100% oxygen Induction Type: IV induction Ventilation: Mask ventilation without difficulty LMA: LMA inserted LMA Size: 4.0 Number of attempts: 1 Airway Equipment and Method: Bite block Placement Confirmation: positive ETCO2 Tube secured with: Tape Dental Injury: Teeth and Oropharynx as per pre-operative assessment

## 2019-08-05 NOTE — Discharge Instructions (Signed)
° ° °  Post Anesthesia Home Care Instructions  Activity: Get plenty of rest for the remainder of the day. A responsible individual must stay with you for 24 hours following the procedure.  For the next 24 hours, DO NOT: -Drive a car -Paediatric nurse -Drink alcoholic beverages -Take any medication unless instructed by your physician -Make any legal decisions or sign important papers.  Meals: Start with liquid foods such as gelatin or soup. Progress to regular foods as tolerated. Avoid greasy, spicy, heavy foods. If nausea and/or vomiting occur, drink only clear liquids until the nausea and/or vomiting subsides. Call your physician if vomiting continues.  Special Instructions/Symptoms: Your throat may feel dry or sore from the anesthesia or the breathing tube placed in your throat during surgery. If this causes discomfort, gargle with warm salt water. The discomfort should disappear within 24 hours.  If you had a scopolamine patch placed behind your ear for the management of post- operative nausea and/or vomiting:  1. The medication in the patch is effective for 72 hours, after which it should be removed.  Wrap patch in a tissue and discard in the trash. Wash hands thoroughly with soap and water. 2. You may remove the patch earlier than 72 hours if you experience unpleasant side effects which may include dry mouth, dizziness or visual disturbances. 3. Avoid touching the patch. Wash your hands with soap and water after contact with the patch.      It is okay to shower and use soap and water but do not use any creams or oils or ointments on the surgical site.  Be careful not to scrub the scalp too vigorously.

## 2019-08-05 NOTE — Anesthesia Preprocedure Evaluation (Signed)
Anesthesia Evaluation  Patient identified by MRN, date of birth, ID band Patient awake    Reviewed: Allergy & Precautions, NPO status , Patient's Chart, lab work & pertinent test results  Airway Mallampati: II  TM Distance: >3 FB Neck ROM: Full    Dental  (+) Dental Advisory Given   Pulmonary neg pulmonary ROS,    breath sounds clear to auscultation       Cardiovascular hypertension, Pt. on medications + DVT (hx PE)   Rhythm:Regular Rate:Normal     Neuro/Psych TIACVA    GI/Hepatic negative GI ROS, Neg liver ROS,   Endo/Other  negative endocrine ROS  Renal/GU negative Renal ROS     Musculoskeletal   Abdominal   Peds  Hematology  (+) Blood dyscrasia (on coumadin), ,   Anesthesia Other Findings   Reproductive/Obstetrics                             Anesthesia Physical Anesthesia Plan  ASA: III  Anesthesia Plan: General   Post-op Pain Management:    Induction: Intravenous  PONV Risk Score and Plan: 3 and Dexamethasone, Ondansetron and Treatment may vary due to age or medical condition  Airway Management Planned: LMA and Oral ETT  Additional Equipment:   Intra-op Plan:   Post-operative Plan: Extubation in OR  Informed Consent: I have reviewed the patients History and Physical, chart, labs and discussed the procedure including the risks, benefits and alternatives for the proposed anesthesia with the patient or authorized representative who has indicated his/her understanding and acceptance.     Dental advisory given  Plan Discussed with: CRNA  Anesthesia Plan Comments:         Anesthesia Quick Evaluation

## 2019-08-08 ENCOUNTER — Encounter (HOSPITAL_BASED_OUTPATIENT_CLINIC_OR_DEPARTMENT_OTHER): Payer: Self-pay | Admitting: Otolaryngology

## 2019-08-08 LAB — SURGICAL PATHOLOGY

## 2019-08-11 LAB — IMIPRAMINE LEVEL
Desipramine: 466 ng/mL
Imipramine Lvl: <20 ng/mL
Total (Imi+Des): <486 ng/mL — ABNORMAL HIGH (ref 175–300)

## 2019-08-11 LAB — SPECIMEN STATUS REPORT

## 2019-08-25 ENCOUNTER — Other Ambulatory Visit: Payer: Self-pay

## 2019-08-25 ENCOUNTER — Ambulatory Visit (INDEPENDENT_AMBULATORY_CARE_PROVIDER_SITE_OTHER): Payer: Medicare Other | Admitting: Psychiatry

## 2019-08-25 DIAGNOSIS — F3341 Major depressive disorder, recurrent, in partial remission: Secondary | ICD-10-CM

## 2019-08-25 MED ORDER — ZOLPIDEM TARTRATE ER 12.5 MG PO TBCR: 12.5000 mg | EXTENDED_RELEASE_TABLET | Freq: Every day | ORAL | 1 refills | Status: DC

## 2019-08-25 MED ORDER — DESIPRAMINE HCL 50 MG PO TABS: 100.0000 mg | ORAL_TABLET | Freq: Every day | ORAL | 1 refills | Status: DC

## 2019-08-25 MED ORDER — ALPRAZOLAM 2 MG PO TABS: 2.0000 mg | ORAL_TABLET | Freq: Every day | ORAL | 3 refills | Status: DC

## 2019-08-25 MED ORDER — ZOLPIDEM TARTRATE ER 12.5 MG PO TBCR: 12.5000 mg | EXTENDED_RELEASE_TABLET | Freq: Every day | ORAL | 3 refills | Status: DC

## 2019-08-25 NOTE — Progress Notes (Signed)
Psychiatric Initial Adult Assessment   Patient Identification: Lauren Chung MRN:  [id number] Date of Evaluation:  08/25/2019 Referral Source [facility] Chief Complaint:    Visit Diagnosis: Major depression   At this time the patient seems to be better.  She still takes care of her autistic son.  The patient says that she is sleeping and eating better.  Abilify and Xanax help her a great deal.  She continues taking her antidepressant.  Patient says that her lupus seems to be stable at this time.  We obtained a desipramine blood level when I was actually on the toxic side.  Today we asked her to actually reduce it to half the dose.  I think being on a lower dose will likely be better for her.  Although it is noted she did not complain of any side effects at this time.  The other good thing about her lately is that her knee is better.  I think the fact that her mood is better might be related to the fact that her hemoglobin is higher.  She says she has a bit more energy.  Overall she is pleased with the way she feels.  She denies being suicidal.  She denies the use of alcohol or drugs. (Hypo) Manic Symptoms:   Anxiety Symptoms:   Psychotic Symptoms:   PTSD Symptoms:   Past Psychiatric History: Past therapy, one psychiatric hospitalization  Previous Psychotropic Medications: Yes   Substance Abuse History in the last 12 months:  No.  Consequences of Substance Abuse: Negative  Past Medical History:  Past Medical History:  Diagnosis Date  . Anemia   . Anxiety   . Depression   . History of blood transfusion    in [place]  . Hyperlipidemia   . Hypertension   . Lupus ([place])   . PE (pulmonary embolism)   . Pneumonia    x 3 - last time 2017  . PTSD (post-traumatic stress disorder)   . Stroke [facility])    many years ago per patient, no deficits  . SVD (spontaneous vaginal delivery)    x 3  . TIA (transient ischemic attack)    many years ago per patient, no deficits    Past Surgical  History:  Procedure Laterality Date  . ABDOMINAL SURGERY    . ABLATION     gyn procedure for bleeding  . cyst removed     right leg with sedation/anesthesia  . EXCISION MASS HEAD N/A 08/05/2019   Procedure: EXCISION OF SCALP CYST;  Surgeon: Izora Gala, MD;  Location: [place];  Service: ENT;  Laterality: N/A;    Family Psychiatric History:   Family History:  Family History  Problem Relation Age of Onset  . Leukemia Father   . Seizures Daughter   . Drug abuse Mother   . Alcohol abuse Mother     Social History:   Social History   Socioeconomic History  . Marital status: Legally Separated    Spouse name: Not on file  . Number of children: Not on file  . Years of education: Not on file  . Highest education level: Not on file  Occupational History  . Not on file  Social Needs  . Financial resource strain: Not on file  . Food insecurity    Worry: Not on file    Inability: Not on file  . Transportation needs    Medical: Not on file    Non-medical: Not on file  Tobacco Use  .  Smoking status: Never Smoker  . Smokeless tobacco: Never Used  Substance and Sexual Activity  . Alcohol use: No  . Drug use: No  . Sexual activity: Yes    Partners: Male    Birth control/protection: Condom, Post-menopausal  Lifestyle  . Physical activity    Days per week: Not on file    Minutes per session: Not on file  . Stress: Not on file  Relationships  . Social Herbalist on phone: Not on file    Gets together: Not on file    Attends religious service: Not on file    Active member of club or organization: Not on file    Attends meetings of clubs or organizations: Not on file    Relationship status: Not on file  Other Topics Concern  . Not on file  Social History Narrative  . Not on file    Additional Social History:   Allergies:   Allergies  Allergen Reactions  . Codeine Shortness Of Breath  . Contrast Media [Iodinated Diagnostic Agents]  Shortness Of Breath  . Tylenol [Acetaminophen] Nausea And Vomiting    Metabolic Disorder Labs: No results found for: HGBA1C, MPG No results found for: PROLACTIN No results found for: CHOL, TRIG, HDL, CHOLHDL, VLDL, LDLCALC   Current Medications: Current Outpatient Medications  Medication Sig Dispense Refill  . alprazolam (XANAX) 2 MG tablet Take 1 tablet (2 mg total) by mouth at bedtime. 90 tablet 3  . cephALEXin (KEFLEX) 500 MG capsule Take 1 capsule (500 mg total) by mouth 3 (three) times daily. 15 capsule 0  . desipramine (NORPRAMIN) 50 MG tablet Take 2 tablets (100 mg total) by mouth daily. 1  qam 90 tablet 1  . ibuprofen (ADVIL) 200 MG tablet Take 400-600 mg by mouth daily as needed for headache or moderate pain.    Marland Kitchen linaclotide (LINZESS) 72 MCG capsule Take 72 mcg by mouth daily as needed (constipation).     . potassium chloride (K-DUR) 10 MEQ tablet Take 10 mEq by mouth 2 (two) times daily.    . rosuvastatin (CRESTOR) 10 MG tablet Take 10 mg by mouth 2 (two) times daily.     Marland Kitchen triamterene-hydrochlorothiazide (MAXZIDE-25) 37.5-25 MG tablet Take 1 tablet by mouth daily.    Marland Kitchen warfarin (COUMADIN) 3 MG tablet Take 3 mg by mouth daily.     Marland Kitchen zolpidem (AMBIEN CR) 12.5 MG CR tablet Take 1 tablet (12.5 mg total) by mouth at bedtime. 30 tablet 3   No current facility-administered medications for this visit.     Neurologic: Headache: No Seizure: No Paresthesias:No  Musculoskeletal: Strength & Muscle Tone: within normal limits Gait & Station: normal Patient leans: N/A  Psychiatric Specialty Exam: ROS  There were no vitals taken for this visit.There is no height or weight on file to calculate BMI.  General Appearance: Casual  Eye Contact:  Good  Speech:  Clear and Coherent  Volume:  Normal  Mood:  Dysphoric  Affect:  Appropriate  Thought Process:  Goal Directed  Orientation:  NA  Thought Content:  Logical  Suicidal Thoughts:  No  Homicidal Thoughts:  No  Memory:  NA   Judgement:  Good  Insight:  Good  Psychomotor Activity:  Normal  Concentration:    Recall:  [place] of Knowledge:Fair  Language: Good  Akathisia:  No  Handed:  Right  AIMS (if indicated):    Assets:  Desire for Improvement  ADL's:  Intact  Cognition: WNL  Sleep:      Treatment Plan Summary: 10/22/20202:33 PM   At this time the patient is doing better.  Her diagnosis is major depression and she will continue taking desipramine but she will lower the dose from 100 mg down to 50 mg.  Should continue taking a fixed dose of Xanax and 12.5 mg of Ambien.  The patient is functioning actually better.  She denies any physical complaints.  Financially she is stable.  I believe she is functioning better.  She will return to see me hopefully in person in 3 months.

## 2019-09-05 DIAGNOSIS — D509 Iron deficiency anemia, unspecified: Secondary | ICD-10-CM | POA: Diagnosis not present

## 2019-09-05 DIAGNOSIS — Z7901 Long term (current) use of anticoagulants: Secondary | ICD-10-CM | POA: Diagnosis not present

## 2019-09-05 DIAGNOSIS — Z86711 Personal history of pulmonary embolism: Secondary | ICD-10-CM | POA: Diagnosis not present

## 2019-10-03 DIAGNOSIS — Z86711 Personal history of pulmonary embolism: Secondary | ICD-10-CM | POA: Diagnosis not present

## 2019-10-03 DIAGNOSIS — Z7901 Long term (current) use of anticoagulants: Secondary | ICD-10-CM | POA: Diagnosis not present

## 2019-10-31 ENCOUNTER — Telehealth (HOSPITAL_COMMUNITY): Payer: Self-pay

## 2019-10-31 ENCOUNTER — Other Ambulatory Visit (HOSPITAL_COMMUNITY): Payer: Self-pay

## 2019-10-31 NOTE — Telephone Encounter (Signed)
Lab results came in for this patient. Results are in your box for review. Thank you 

## 2019-11-01 ENCOUNTER — Other Ambulatory Visit (HOSPITAL_COMMUNITY): Payer: Self-pay

## 2019-11-01 MED ORDER — DESIPRAMINE HCL 50 MG PO TABS: ORAL_TABLET | ORAL | 1 refills | Status: DC

## 2019-11-01 MED ORDER — ZOLPIDEM TARTRATE ER 12.5 MG PO TBCR: 12.5000 mg | EXTENDED_RELEASE_TABLET | Freq: Every day | ORAL | 3 refills | Status: DC

## 2019-11-02 ENCOUNTER — Other Ambulatory Visit (HOSPITAL_COMMUNITY): Payer: Self-pay

## 2019-11-03 ENCOUNTER — Other Ambulatory Visit (HOSPITAL_COMMUNITY): Payer: Self-pay

## 2019-11-03 MED ORDER — DESIPRAMINE HCL 25 MG PO TABS: ORAL_TABLET | ORAL | 1 refills | Status: DC

## 2019-11-03 MED ORDER — DESIPRAMINE HCL 25 MG PO TABS: 25.0000 mg | ORAL_TABLET | Freq: Every day | ORAL | 2 refills | Status: DC

## 2019-11-28 DIAGNOSIS — Z7901 Long term (current) use of anticoagulants: Secondary | ICD-10-CM | POA: Diagnosis not present

## 2019-11-28 DIAGNOSIS — Z86711 Personal history of pulmonary embolism: Secondary | ICD-10-CM | POA: Diagnosis not present

## 2019-11-30 ENCOUNTER — Other Ambulatory Visit: Payer: Self-pay

## 2019-11-30 ENCOUNTER — Ambulatory Visit (INDEPENDENT_AMBULATORY_CARE_PROVIDER_SITE_OTHER): Payer: Medicare Other | Admitting: Psychiatry

## 2019-11-30 DIAGNOSIS — F324 Major depressive disorder, single episode, in partial remission: Secondary | ICD-10-CM

## 2019-11-30 DIAGNOSIS — M329 Systemic lupus erythematosus, unspecified: Secondary | ICD-10-CM | POA: Diagnosis not present

## 2019-11-30 DIAGNOSIS — G4700 Insomnia, unspecified: Secondary | ICD-10-CM | POA: Diagnosis not present

## 2019-11-30 MED ORDER — ZOLPIDEM TARTRATE ER 12.5 MG PO TBCR: 12.5000 mg | EXTENDED_RELEASE_TABLET | Freq: Every day | ORAL | 5 refills | Status: DC

## 2019-11-30 MED ORDER — ALPRAZOLAM 2 MG PO TABS: 2.0000 mg | ORAL_TABLET | Freq: Every day | ORAL | 3 refills | Status: DC

## 2019-11-30 MED ORDER — DESIPRAMINE HCL 25 MG PO TABS: ORAL_TABLET | ORAL | 1 refills | Status: DC

## 2019-11-30 NOTE — Progress Notes (Signed)
Psychiatric Initial Adult Assessment   Patient Identification: Lauren Chung MRN:  [id number] Date of Evaluation:  11/30/2019 Referral Source [facility] Chief Complaint:    Visit Diagnosis: Major depression   Today the patient seems to be doing quite well.  She feels a little bit isolated but overall her health is much improved.  Her lupus is calm.  Her mood is much improved since we reduced her desipramine from 50 mg down to 25 mg.  This was because her level was too high.  The patient also takes Ambien 12.5 mg and Xanax 2 mg at night for sleep.  Her anxiety is well controlled.  She denies the use of alcohol or tobacco ever.  She uses no drugs.  Spends most of her life taking care of her adult son who has autism.  Unfortunately since have seen her she actually had a Covid virus and had 2 episodes of illness.  She never really got short of breath but she had a fever both times.  Overall now she feels resolved.  Feels physically back to her self.  Emotionally she actually feels much better.  I believe the adjusted dose of desipramine was beneficial.  She has a good amount of energy.  She denies any problems thinking or concentrating.  She a good sense of self-esteem.  She is not suicidal.  He has never had any psychosis.  In our last note there was an error that she was on Abilify but this was not true.  Patient is actually functioning quite well at this time. Anxiety Symptoms:   Psychotic Symptoms:   PTSD Symptoms:   Past Psychiatric History: Past therapy, one psychiatric hospitalization  Previous Psychotropic Medications: Yes   Substance Abuse History in the last 12 months:  No.  Consequences of Substance Abuse: Negative  Past Medical History:  Past Medical History:  Diagnosis Date  . Anemia   . Anxiety   . Depression   . History of blood transfusion    in [place]  . Hyperlipidemia   . Hypertension   . Lupus ([place])   . PE (pulmonary embolism)   . Pneumonia    x 3 - last time  2017  . PTSD (post-traumatic stress disorder)   . Stroke [facility])    many years ago per patient, no deficits  . SVD (spontaneous vaginal delivery)    x 3  . TIA (transient ischemic attack)    many years ago per patient, no deficits    Past Surgical History:  Procedure Laterality Date  . ABDOMINAL SURGERY    . ABLATION     gyn procedure for bleeding  . cyst removed     right leg with sedation/anesthesia  . EXCISION MASS HEAD N/A 08/05/2019   Procedure: EXCISION OF SCALP CYST;  Surgeon: Izora Gala, MD;  Location: [place];  Service: ENT;  Laterality: N/A;    Family Psychiatric History:   Family History:  Family History  Problem Relation Age of Onset  . Leukemia Father   . Seizures Daughter   . Drug abuse Mother   . Alcohol abuse Mother     Social History:   Social History   Socioeconomic History  . Marital status: Legally Separated    Spouse name: Not on file  . Number of children: Not on file  . Years of education: Not on file  . Highest education level: Not on file  Occupational History  . Not on file  Tobacco Use  . Smoking  status: Never Smoker  . Smokeless tobacco: Never Used  Substance and Sexual Activity  . Alcohol use: No  . Drug use: No  . Sexual activity: Yes    Partners: Male    Birth control/protection: Condom, Post-menopausal  Other Topics Concern  . Not on file  Social History Narrative  . Not on file   Social Determinants of Health   Financial Resource Strain:   . Difficulty of Paying Living Expenses: Not on file  Food Insecurity:   . Worried About Charity fundraiser in the Last Year: Not on file  . Ran Out of Food in the Last Year: Not on file  Transportation Needs:   . Lack of Transportation (Medical): Not on file  . Lack of Transportation (Non-Medical): Not on file  Physical Activity:   . Days of Exercise per Week: Not on file  . Minutes of Exercise per Session: Not on file  Stress:   . Feeling of Stress : Not on  file  Social Connections:   . Frequency of Communication with Friends and Family: Not on file  . Frequency of Social Gatherings with Friends and Family: Not on file  . Attends Religious Services: Not on file  . Active Member of Clubs or Organizations: Not on file  . Attends Archivist Meetings: Not on file  . Marital Status: Not on file    Additional Social History:   Allergies:   Allergies  Allergen Reactions  . Codeine Shortness Of Breath  . Contrast Media [Iodinated Diagnostic Agents] Shortness Of Breath  . Tylenol [Acetaminophen] Nausea And Vomiting    Metabolic Disorder Labs: No results found for: HGBA1C, MPG No results found for: PROLACTIN No results found for: CHOL, TRIG, HDL, CHOLHDL, VLDL, LDLCALC   Current Medications: Current Outpatient Medications  Medication Sig Dispense Refill  . alprazolam (XANAX) 2 MG tablet Take 1 tablet (2 mg total) by mouth at bedtime. 90 tablet 3  . cephALEXin (KEFLEX) 500 MG capsule Take 1 capsule (500 mg total) by mouth 3 (three) times daily. 15 capsule 0  . desipramine (NORPRAMIN) 25 MG tablet Take 1 tablet (25mg  total) in the morning by mouth daily 90 tablet 1  . ibuprofen (ADVIL) 200 MG tablet Take 400-600 mg by mouth daily as needed for headache or moderate pain.    Marland Kitchen linaclotide (LINZESS) 72 MCG capsule Take 72 mcg by mouth daily as needed (constipation).     . potassium chloride (K-DUR) 10 MEQ tablet Take 10 mEq by mouth 2 (two) times daily.    . rosuvastatin (CRESTOR) 10 MG tablet Take 10 mg by mouth 2 (two) times daily.     Marland Kitchen triamterene-hydrochlorothiazide (MAXZIDE-25) 37.5-25 MG tablet Take 1 tablet by mouth daily.    Marland Kitchen warfarin (COUMADIN) 3 MG tablet Take 3 mg by mouth daily.     Marland Kitchen zolpidem (AMBIEN CR) 12.5 MG CR tablet Take 1 tablet (12.5 mg total) by mouth at bedtime. 30 tablet 5   No current facility-administered medications for this visit.    Neurologic: Headache: No Seizure:  No Paresthesias:No  Musculoskeletal: Strength & Muscle Tone: within normal limits Gait & Station: normal Patient leans: N/A  Psychiatric Specialty Exam: ROS  There were no vitals taken for this visit.There is no height or weight on file to calculate BMI.  General Appearance: Casual  Eye Contact:  Good  Speech:  Clear and Coherent  Volume:  Normal  Mood:  Dysphoric  Affect:  Appropriate  Thought Process:  Goal  Directed  Orientation:  NA  Thought Content:  Logical  Suicidal Thoughts:  No  Homicidal Thoughts:  No  Memory:  NA  Judgement:  Good  Insight:  Good  Psychomotor Activity:  Normal  Concentration:    Recall:  [place] of Knowledge:Fair  Language: Good  Akathisia:  No  Handed:  Right  AIMS (if indicated):    Assets:  Desire for Improvement  ADL's:  Intact  Cognition: WNL  Sleep:      Treatment Plan Summary: 1/27/20212:46 PM   This patient has 2 problems.  The first is that of major depression.  She is now doing much better on desipramine 25 mg.  Her second problem is that of insomnia.  Ambien 12.5 mg together with Xanax 2 mg has been very helpful.  She is not oversedated.  She is not sleepy.  She feels refreshed in the morning.  She is functioning reasonably well.  The patient's lupus is well controlled.  She is very stable.  She will return to see me in 4 months.

## 2019-12-12 DIAGNOSIS — Z86711 Personal history of pulmonary embolism: Secondary | ICD-10-CM | POA: Diagnosis not present

## 2019-12-12 DIAGNOSIS — Z7901 Long term (current) use of anticoagulants: Secondary | ICD-10-CM | POA: Diagnosis not present

## 2019-12-26 DIAGNOSIS — Z86711 Personal history of pulmonary embolism: Secondary | ICD-10-CM | POA: Diagnosis not present

## 2019-12-26 DIAGNOSIS — Z7901 Long term (current) use of anticoagulants: Secondary | ICD-10-CM | POA: Diagnosis not present

## 2020-01-09 DIAGNOSIS — Z86711 Personal history of pulmonary embolism: Secondary | ICD-10-CM | POA: Diagnosis not present

## 2020-01-09 DIAGNOSIS — Z7901 Long term (current) use of anticoagulants: Secondary | ICD-10-CM | POA: Diagnosis not present

## 2020-01-16 DIAGNOSIS — Z23 Encounter for immunization: Secondary | ICD-10-CM | POA: Diagnosis not present

## 2020-02-06 DIAGNOSIS — Z7901 Long term (current) use of anticoagulants: Secondary | ICD-10-CM | POA: Diagnosis not present

## 2020-02-06 DIAGNOSIS — Z86711 Personal history of pulmonary embolism: Secondary | ICD-10-CM | POA: Diagnosis not present

## 2020-02-13 DIAGNOSIS — Z23 Encounter for immunization: Secondary | ICD-10-CM | POA: Diagnosis not present

## 2020-03-07 DIAGNOSIS — Z86711 Personal history of pulmonary embolism: Secondary | ICD-10-CM | POA: Diagnosis not present

## 2020-03-07 DIAGNOSIS — Z7901 Long term (current) use of anticoagulants: Secondary | ICD-10-CM | POA: Diagnosis not present

## 2020-03-28 ENCOUNTER — Other Ambulatory Visit: Payer: Self-pay

## 2020-03-28 ENCOUNTER — Ambulatory Visit (INDEPENDENT_AMBULATORY_CARE_PROVIDER_SITE_OTHER): Payer: Medicare Other | Admitting: Psychiatry

## 2020-03-28 DIAGNOSIS — F339 Major depressive disorder, recurrent, unspecified: Secondary | ICD-10-CM | POA: Diagnosis not present

## 2020-03-28 MED ORDER — MIRTAZAPINE 30 MG PO TABS: ORAL_TABLET | ORAL | 3 refills | Status: DC

## 2020-03-28 MED ORDER — ALPRAZOLAM 2 MG PO TABS: 2.0000 mg | ORAL_TABLET | Freq: Every day | ORAL | 3 refills | Status: DC

## 2020-03-28 MED ORDER — ZOLPIDEM TARTRATE ER 12.5 MG PO TBCR: 12.5000 mg | EXTENDED_RELEASE_TABLET | Freq: Every day | ORAL | 5 refills | Status: DC

## 2020-03-28 NOTE — Progress Notes (Signed)
Psychiatric Initial Adult Assessment   Patient Identification: Lauren Chung MRN:  [id number] Date of Evaluation:  03/28/2020 Referral Source [facility] Chief Complaint:    Visit Diagnosis: Major depression  For reasons that are not clear the patient has found herself being depressed for over a month.  It is more than half the time.  Her sleep is reduced.  She has lost weight because she is lost her appetite.  She is lost her ability to enjoy many things.  Even when her mother came to visit her she did not enjoy the time.  Patient's lupus is actually very stable.  Her son who has autism is actually doing well.  Patient cannot identify a psychosocial stressor.  She still takes desipramine 25 mg.  Patient is not suicidal.  She is psychomotor slowed.  She does describe more anxiety.  Patient denies any physical complaints.  She shows no evidence of psychosis.  She denies drinking any alcohol or using any drugs.   Anxiety Symptoms:   Psychotic Symptoms:   PTSD Symptoms:   Past Psychiatric History: Past therapy, one psychiatric hospitalization  Previous Psychotropic Medications: Yes   Substance Abuse History in the last 12 months:  No.  Consequences of Substance Abuse: Negative  Past Medical History:  Past Medical History:  Diagnosis Date  . Anemia   . Anxiety   . Depression   . History of blood transfusion    in [place]  . Hyperlipidemia   . Hypertension   . Lupus ([place])   . PE (pulmonary embolism)   . Pneumonia    x 3 - last time 2017  . PTSD (post-traumatic stress disorder)   . Stroke [facility])    many years ago per patient, no deficits  . SVD (spontaneous vaginal delivery)    x 3  . TIA (transient ischemic attack)    many years ago per patient, no deficits    Past Surgical History:  Procedure Laterality Date  . ABDOMINAL SURGERY    . ABLATION     gyn procedure for bleeding  . cyst removed     right leg with sedation/anesthesia  . EXCISION MASS HEAD N/A 08/05/2019    Procedure: EXCISION OF SCALP CYST;  Surgeon: Izora Gala, MD;  Location: [place];  Service: ENT;  Laterality: N/A;    Family Psychiatric History:   Family History:  Family History  Problem Relation Age of Onset  . Leukemia Father   . Seizures Daughter   . Drug abuse Mother   . Alcohol abuse Mother     Social History:   Social History   Socioeconomic History  . Marital status: Legally Separated    Spouse name: Not on file  . Number of children: Not on file  . Years of education: Not on file  . Highest education level: Not on file  Occupational History  . Not on file  Tobacco Use  . Smoking status: Never Smoker  . Smokeless tobacco: Never Used  Substance and Sexual Activity  . Alcohol use: No  . Drug use: No  . Sexual activity: Yes    Partners: Male    Birth control/protection: Condom, Post-menopausal  Other Topics Concern  . Not on file  Social History Narrative  . Not on file   Social Determinants of Health   Financial Resource Strain:   . Difficulty of Paying Living Expenses:   Food Insecurity:   . Worried About Charity fundraiser in the Last Year:   .  Ran Out of Food in the Last Year:   Transportation Needs:   . Film/video editor (Medical):   Marland Kitchen Lack of Transportation (Non-Medical):   Physical Activity:   . Days of Exercise per Week:   . Minutes of Exercise per Session:   Stress:   . Feeling of Stress :   Social Connections:   . Frequency of Communication with Friends and Family:   . Frequency of Social Gatherings with Friends and Family:   . Attends Religious Services:   . Active Member of Clubs or Organizations:   . Attends Archivist Meetings:   Marland Kitchen Marital Status:     Additional Social History:   Allergies:   Allergies  Allergen Reactions  . Codeine Shortness Of Breath  . Contrast Media [Iodinated Diagnostic Agents] Shortness Of Breath  . Tylenol [Acetaminophen] Nausea And Vomiting    Metabolic Disorder  Labs: No results found for: HGBA1C, MPG No results found for: PROLACTIN No results found for: CHOL, TRIG, HDL, CHOLHDL, VLDL, LDLCALC   Current Medications: Current Outpatient Medications  Medication Sig Dispense Refill  . alprazolam (XANAX) 2 MG tablet Take 1 tablet (2 mg total) by mouth at bedtime. 90 tablet 3  . cephALEXin (KEFLEX) 500 MG capsule Take 1 capsule (500 mg total) by mouth 3 (three) times daily. 15 capsule 0  . desipramine (NORPRAMIN) 25 MG tablet Take 1 tablet (25mg  total) in the morning by mouth daily 90 tablet 1  . ibuprofen (ADVIL) 200 MG tablet Take 400-600 mg by mouth daily as needed for headache or moderate pain.    Marland Kitchen linaclotide (LINZESS) 72 MCG capsule Take 72 mcg by mouth daily as needed (constipation).     . mirtazapine (REMERON) 30 MG tablet 1  qhs 30 tablet 3  . potassium chloride (K-DUR) 10 MEQ tablet Take 10 mEq by mouth 2 (two) times daily.    . rosuvastatin (CRESTOR) 10 MG tablet Take 10 mg by mouth 2 (two) times daily.     Marland Kitchen triamterene-hydrochlorothiazide (MAXZIDE-25) 37.5-25 MG tablet Take 1 tablet by mouth daily.    Marland Kitchen warfarin (COUMADIN) 3 MG tablet Take 3 mg by mouth daily.     Marland Kitchen zolpidem (AMBIEN CR) 12.5 MG CR tablet Take 1 tablet (12.5 mg total) by mouth at bedtime. 30 tablet 5   No current facility-administered medications for this visit.    Neurologic: Headache: No Seizure: No Paresthesias:No  Musculoskeletal: Strength & Muscle Tone: within normal limits Gait & Station: normal Patient leans: N/A  Psychiatric Specialty Exam: ROS  There were no vitals taken for this visit.There is no height or weight on file to calculate BMI.  General Appearance: Casual  Eye Contact:  Good  Speech:  Clear and Coherent  Volume:  Normal  Mood:  Dysphoric  Affect:  Appropriate  Thought Process:  Goal Directed  Orientation:  NA  Thought Content:  Logical  Suicidal Thoughts:  No  Homicidal Thoughts:  No  Memory:  NA  Judgement:  Good  Insight:  Good   Psychomotor Activity:  Normal  Concentration:    Recall:  [place] of Knowledge:Fair  Language: Good  Akathisia:  No  Handed:  Right  AIMS (if indicated):    Assets:  Desire for Improvement  ADL's:  Intact  Cognition: WNL  Sleep:      Treatment Plan Summary: 5/26/20211:28 PM   At this time the patient is #1 problem is that of major depression.  Apparently higher doses of desipramine caused  more side effects.  Therefore we will go ahead and discontinue the 25 mg of desipramine and begin her on Remeron 30 mg every night.  Her second problem is insomnia.  The patient will continue taking Ambien and Xanax as prescribed.  Patient is not suicidal.  She continues to function.  She says she would never attempt to end her life because she has to be there for her autistic son who lives with her.  It is noted the patient has been on Wellbutrin in the past and it failed.  It is noted the patient is on a blood thinner and she will be cautious about the consideration of an SSRI.

## 2020-04-04 ENCOUNTER — Other Ambulatory Visit (HOSPITAL_COMMUNITY): Payer: Self-pay | Admitting: *Deleted

## 2020-04-04 ENCOUNTER — Telehealth (HOSPITAL_COMMUNITY): Payer: Self-pay | Admitting: *Deleted

## 2020-04-04 MED ORDER — NORTRIPTYLINE HCL 25 MG PO CAPS: 25.0000 mg | ORAL_CAPSULE | Freq: Every day | ORAL | 5 refills | Status: DC

## 2020-04-04 NOTE — Telephone Encounter (Signed)
Writer called pt in response to her c/o that the recently started Remeron was making her "critically ill". VM was left for pt instructing her that Dr. Has changed the Remeron to Pamelor 25 mg QHS. Pt instructed to wait 3 days to start new medication. Pt encouraged to call with questions or concerns.

## 2020-04-05 DIAGNOSIS — Z7901 Long term (current) use of anticoagulants: Secondary | ICD-10-CM | POA: Diagnosis not present

## 2020-04-05 DIAGNOSIS — Z86711 Personal history of pulmonary embolism: Secondary | ICD-10-CM | POA: Diagnosis not present

## 2020-04-27 ENCOUNTER — Other Ambulatory Visit (HOSPITAL_COMMUNITY): Payer: Self-pay | Admitting: Psychiatry

## 2020-05-21 DIAGNOSIS — G43909 Migraine, unspecified, not intractable, without status migrainosus: Secondary | ICD-10-CM | POA: Diagnosis not present

## 2020-05-21 DIAGNOSIS — Z7901 Long term (current) use of anticoagulants: Secondary | ICD-10-CM | POA: Diagnosis not present

## 2020-05-21 DIAGNOSIS — I10 Essential (primary) hypertension: Secondary | ICD-10-CM | POA: Diagnosis not present

## 2020-05-21 DIAGNOSIS — K5900 Constipation, unspecified: Secondary | ICD-10-CM | POA: Diagnosis not present

## 2020-05-21 DIAGNOSIS — G4700 Insomnia, unspecified: Secondary | ICD-10-CM | POA: Diagnosis not present

## 2020-05-21 DIAGNOSIS — K219 Gastro-esophageal reflux disease without esophagitis: Secondary | ICD-10-CM | POA: Diagnosis not present

## 2020-05-21 DIAGNOSIS — D649 Anemia, unspecified: Secondary | ICD-10-CM | POA: Diagnosis not present

## 2020-05-21 DIAGNOSIS — E7800 Pure hypercholesterolemia, unspecified: Secondary | ICD-10-CM | POA: Diagnosis not present

## 2020-05-21 DIAGNOSIS — F418 Other specified anxiety disorders: Secondary | ICD-10-CM | POA: Diagnosis not present

## 2020-05-21 DIAGNOSIS — R002 Palpitations: Secondary | ICD-10-CM | POA: Diagnosis not present

## 2020-05-21 DIAGNOSIS — M329 Systemic lupus erythematosus, unspecified: Secondary | ICD-10-CM | POA: Diagnosis not present

## 2020-05-21 DIAGNOSIS — M797 Fibromyalgia: Secondary | ICD-10-CM | POA: Diagnosis not present

## 2020-06-01 ENCOUNTER — Telehealth (INDEPENDENT_AMBULATORY_CARE_PROVIDER_SITE_OTHER): Payer: Medicare Other | Admitting: Psychiatry

## 2020-06-01 ENCOUNTER — Other Ambulatory Visit: Payer: Self-pay

## 2020-06-01 DIAGNOSIS — F3341 Major depressive disorder, recurrent, in partial remission: Secondary | ICD-10-CM

## 2020-06-01 MED ORDER — MIRTAZAPINE 30 MG PO TABS
30.0000 mg | ORAL_TABLET | Freq: Every day | ORAL | 3 refills | Status: DC
Start: 2020-06-01 — End: 2020-06-13

## 2020-06-01 NOTE — Progress Notes (Signed)
Psychiatric Initial Adult Assessment   Patient Identification: Lauren Chung MRN:  [id number] Date of Evaluation:  06/01/2020 Referral Source [facility] Chief Complaint:    Visit Diagnosis: Major depression  Patient feels much much better.  She has had a dramatic turnaround.  Her mood is much improved she is sleeping and eating well now.  She is planning to go on vacation to [place].  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  Her son is doing okay.  The patient is much improved her energy level is better she has no evidence of psychosis.  She has never been suicidal.  She drinks no alcohol uses no drugs.  Her anxiety is much better controlled.  She is very appreciative of the adjustments made in her medicines.   Anxiety Symptoms:   Psychotic Symptoms:   PTSD Symptoms:   Past Psychiatric History: Past therapy, one psychiatric hospitalization  Previous Psychotropic Medications: Yes   Substance Abuse History in the last 12 months:  No.  Consequences of Substance Abuse: Negative  Past Medical History:  Past Medical History:  Diagnosis Date  . Anemia   . Anxiety   . Depression   . History of blood transfusion    in [place]  . Hyperlipidemia   . Hypertension   . Lupus ([place])   . PE (pulmonary embolism)   . Pneumonia    x 3 - last time 2017  . PTSD (post-traumatic stress disorder)   . Stroke [facility])    many years ago per patient, no deficits  . SVD (spontaneous vaginal delivery)    x 3  . TIA (transient ischemic attack)    many years ago per patient, no deficits    Past Surgical History:  Procedure Laterality Date  . ABDOMINAL SURGERY    . ABLATION     gyn procedure for bleeding  . cyst removed     right leg with sedation/anesthesia  . EXCISION MASS HEAD N/A 08/05/2019   Procedure: EXCISION OF SCALP CYST;  Surgeon: Izora Gala, MD;  Location: [place];  Service: ENT;  Laterality: N/A;    Family Psychiatric  History:   Family History:  Family History  Problem Relation Age of Onset  . Leukemia Father   . Seizures Daughter   . Drug abuse Mother   . Alcohol abuse Mother     Social History:   Social History   Socioeconomic History  . Marital status: Legally Separated    Spouse name: Not on file  . Number of children: Not on file  . Years of education: Not on file  . Highest education level: Not on file  Occupational History  . Not on file  Tobacco Use  . Smoking status: Never Smoker  . Smokeless tobacco: Never Used  Vaping Use  . Vaping Use: Never used  Substance and Sexual Activity  . Alcohol use: No  . Drug use: No  . Sexual activity: Yes    Partners: Male    Birth control/protection: Condom, Post-menopausal  Other Topics Concern  . Not on file  Social History Narrative  . Not on file   Social Determinants of Health   Financial Resource Strain:   . Difficulty of Paying Living Expenses:   Food Insecurity:   . Worried About Charity fundraiser in the Last Year:   . Arboriculturist in the Last Year:   Transportation Needs:   . Film/video editor (Medical):   Marland Kitchen  Lack of Transportation (Non-Medical):   Physical Activity:   . Days of Exercise per Week:   . Minutes of Exercise per Session:   Stress:   . Feeling of Stress :   Social Connections:   . Frequency of Communication with Friends and Family:   . Frequency of Social Gatherings with Friends and Family:   . Attends Religious Services:   . Active Member of Clubs or Organizations:   . Attends Archivist Meetings:   Marland Kitchen Marital Status:     Additional Social History:   Allergies:   Allergies  Allergen Reactions  . Codeine Shortness Of Breath  . Contrast Media [Iodinated Diagnostic Agents] Shortness Of Breath  . Tylenol [Acetaminophen] Nausea And Vomiting    Metabolic Disorder Labs: No results found for: HGBA1C, MPG No results found for: PROLACTIN No results found for: CHOL, TRIG, HDL, CHOLHDL,  VLDL, LDLCALC   Current Medications: Current Outpatient Medications  Medication Sig Dispense Refill  . alprazolam (XANAX) 2 MG tablet Take 1 tablet (2 mg total) by mouth at bedtime. 90 tablet 3  . cephALEXin (KEFLEX) 500 MG capsule Take 1 capsule (500 mg total) by mouth 3 (three) times daily. 15 capsule 0  . ibuprofen (ADVIL) 200 MG tablet Take 400-600 mg by mouth daily as needed for headache or moderate pain.    Marland Kitchen linaclotide (LINZESS) 72 MCG capsule Take 72 mcg by mouth daily as needed (constipation).     . nortriptyline (PAMELOR) 25 MG capsule Take 1 capsule (25 mg total) by mouth at bedtime. 30 capsule 5  . potassium chloride (K-DUR) 10 MEQ tablet Take 10 mEq by mouth 2 (two) times daily.    . rosuvastatin (CRESTOR) 10 MG tablet Take 10 mg by mouth 2 (two) times daily.     Marland Kitchen triamterene-hydrochlorothiazide (MAXZIDE-25) 37.5-25 MG tablet Take 1 tablet by mouth daily.    Marland Kitchen warfarin (COUMADIN) 3 MG tablet Take 3 mg by mouth daily.     Marland Kitchen zolpidem (AMBIEN CR) 12.5 MG CR tablet Take 1 tablet (12.5 mg total) by mouth at bedtime. 30 tablet 5   No current facility-administered medications for this visit.    Neurologic: Headache: No Seizure: No Paresthesias:No  Musculoskeletal: Strength & Muscle Tone: within normal limits Gait & Station: normal Patient leans: N/A  Psychiatric Specialty Exam: ROS  There were no vitals taken for this visit.There is no height or weight on file to calculate BMI.  General Appearance: Casual  Eye Contact:  Good  Speech:  Clear and Coherent  Volume:  Normal  Mood:  Dysphoric  Affect:  Appropriate  Thought Process:  Goal Directed  Orientation:  NA  Thought Content:  Logical  Suicidal Thoughts:  No  Homicidal Thoughts:  No  Memory:  NA  Judgement:  Good  Insight:  Good  Psychomotor Activity:  Normal  Concentration:    Recall:  [place] of Knowledge:Fair  Language: Good  Akathisia:  No  Handed:  Right  AIMS (if indicated):    Assets:  Desire  for Improvement  ADL's:  Intact  Cognition: WNL  Sleep:      Treatment Plan Summary: 7/30/202111:44 AM   At this time the patient's major problem is major depression.  She showed a great response to Remeron 30 mg.  Her second problem is insomnia.  Patient is now doing better continue to take Ambien and Xanax.  Her mood is dramatically improved.  She is back to herself.  She is now enjoying things  much more.  She is functioning much better.  She will return to see me in about 2-1/2 months.

## 2020-06-04 DIAGNOSIS — Z86711 Personal history of pulmonary embolism: Secondary | ICD-10-CM | POA: Diagnosis not present

## 2020-06-04 DIAGNOSIS — R944 Abnormal results of kidney function studies: Secondary | ICD-10-CM | POA: Diagnosis not present

## 2020-06-04 DIAGNOSIS — Z7901 Long term (current) use of anticoagulants: Secondary | ICD-10-CM | POA: Diagnosis not present

## 2020-06-13 ENCOUNTER — Encounter: Payer: Self-pay | Admitting: Cardiology

## 2020-06-13 ENCOUNTER — Other Ambulatory Visit: Payer: Self-pay

## 2020-06-13 ENCOUNTER — Ambulatory Visit: Payer: Medicare Other

## 2020-06-13 ENCOUNTER — Ambulatory Visit: Payer: Medicare Other | Admitting: Cardiology

## 2020-06-13 VITALS — BP 124/74 | HR 106 | Resp 17 | Ht 68.0 in | Wt 180.0 lb

## 2020-06-13 DIAGNOSIS — R0609 Other forms of dyspnea: Secondary | ICD-10-CM

## 2020-06-13 DIAGNOSIS — N183 Chronic kidney disease, stage 3 unspecified: Secondary | ICD-10-CM

## 2020-06-13 DIAGNOSIS — Z7901 Long term (current) use of anticoagulants: Secondary | ICD-10-CM

## 2020-06-13 DIAGNOSIS — Z86711 Personal history of pulmonary embolism: Secondary | ICD-10-CM

## 2020-06-13 DIAGNOSIS — Z8673 Personal history of transient ischemic attack (TIA), and cerebral infarction without residual deficits: Secondary | ICD-10-CM

## 2020-06-13 DIAGNOSIS — I129 Hypertensive chronic kidney disease with stage 1 through stage 4 chronic kidney disease, or unspecified chronic kidney disease: Secondary | ICD-10-CM | POA: Diagnosis not present

## 2020-06-13 DIAGNOSIS — M329 Systemic lupus erythematosus, unspecified: Secondary | ICD-10-CM

## 2020-06-13 DIAGNOSIS — R002 Palpitations: Secondary | ICD-10-CM

## 2020-06-13 DIAGNOSIS — R0600 Dyspnea, unspecified: Secondary | ICD-10-CM | POA: Diagnosis not present

## 2020-06-13 NOTE — Progress Notes (Signed)
Date:  06/13/2020   ID:  [name], DOB 02/03/1966, MRN [id number]  PCP:  Antony Contras, MD  Cardiologist:  Rex Kras, DO, [facility] (established care 06/13/2020)  REASON FOR CONSULT: Palpitation  REQUESTING PHYSICIAN:  Antony Contras, MD [place],  [place] [address]  Chief Complaint  Patient presents with  . Palpitations  . New Patient (Initial Visit)    HPI  Lauren Chung is a 54 y.o. female who presents to the office with a chief complaint of "palpitations." She is referred to the office at the request of Antony Contras, MD. Patient's past medical history and cardiovascular risk factors include: Hx of pulmonary embolism, hx of CVA, Lupus, HLD, HTN, CKD, iron deficiency anemia.   Patient is referred to the office at the request of her primary care provider for evaluation and management of palpitations.  Palpitations: Patient states that symptoms have been going on for approximately 1 year but have now been progressive.  She has symptoms of palpitations with day-to-day activities such as cooking cleaning showering, going up and down the stairs.  It improves with resting and the symptoms are usually brought on by effort.  They last for about 10 to 15 minutes.  Patient does not consume caffeine, soda, energy drinks, no new over-the-counter medications, herbal supplements, no stimulant drugs.  No known thyroid disease most recent TSH level reviewed noted below.  She has underlying iron deficiency anemia and is currently on long-term oral anticoagulation given her history of PE.  No consumption of alcohol.  She also complains of symptoms of dyspnea on exertion without any symptoms of heart failure.  FUNCTIONAL STATUS: No structured exercise program or daily routine.   ALLERGIES: Allergies  Allergen Reactions  . Codeine Shortness Of Breath  . Contrast Media [Iodinated Diagnostic Agents] Shortness Of Breath  . Tylenol [Acetaminophen] Nausea And Vomiting     MEDICATION LIST PRIOR TO VISIT: Current Meds  Medication Sig  . alprazolam (XANAX) 2 MG tablet Take 1 tablet (2 mg total) by mouth at bedtime.  . nortriptyline (PAMELOR) 25 MG capsule Take 1 capsule (25 mg total) by mouth at bedtime.  . potassium chloride (K-DUR) 10 MEQ tablet Take 20 mEq by mouth once.   . rosuvastatin (CRESTOR) 10 MG tablet Take 10 mg by mouth at bedtime.   . triamterene-hydrochlorothiazide (MAXZIDE-25) 37.5-25 MG tablet Take 1 tablet by mouth daily.  Marland Kitchen warfarin (COUMADIN) 3 MG tablet Take 3 mg by mouth daily.   Marland Kitchen zolpidem (AMBIEN CR) 12.5 MG CR tablet Take 1 tablet (12.5 mg total) by mouth at bedtime.     PAST MEDICAL HISTORY: Past Medical History:  Diagnosis Date  . Anemia   . Anxiety   . Depression   . History of blood transfusion    in [place]  . Hyperlipidemia   . Hypertension   . Lupus ([place])   . PE (pulmonary embolism)   . Pneumonia    x 3 - last time 2017  . PTSD (post-traumatic stress disorder)   . Stroke [facility])    many years ago per patient, no deficits  . SVD (spontaneous vaginal delivery)    x 3  . TIA (transient ischemic attack)    many years ago per patient, no deficits    PAST SURGICAL HISTORY: Past Surgical History:  Procedure Laterality Date  . ABDOMINAL SURGERY    . ABLATION     gyn procedure for bleeding  . cyst removed     right leg  with sedation/anesthesia  . EXCISION MASS HEAD N/A 08/05/2019   Procedure: EXCISION OF SCALP CYST;  Surgeon: Izora Gala, MD;  Location: [place];  Service: ENT;  Laterality: N/A;    FAMILY HISTORY: The patient family history includes Alcohol abuse in her mother; Drug abuse in her mother; Leukemia in her father; Seizures in her daughter.  SOCIAL HISTORY:  The patient  reports that she has never smoked. She has never used smokeless tobacco. She reports that she does not drink alcohol and does not use drugs.  REVIEW OF SYSTEMS: Review of Systems  Constitutional: Negative for chills  and fever.  HENT: Negative for hoarse voice and nosebleeds.   Eyes: Negative for discharge, double vision and pain.  Cardiovascular: Positive for dyspnea on exertion and palpitations. Negative for chest pain, claudication, leg swelling, near-syncope, orthopnea, paroxysmal nocturnal dyspnea and syncope.  Respiratory: Negative for hemoptysis and shortness of breath.   Musculoskeletal: Negative for muscle cramps and myalgias.  Gastrointestinal: Negative for abdominal pain, constipation, diarrhea, hematemesis, hematochezia, melena, nausea and vomiting.  Neurological: Negative for dizziness and light-headedness.    PHYSICAL EXAM: Vitals with BMI 06/13/2020 08/05/2019 08/05/2019  Height _0  - -  Weight 180 lbs - -  BMI 89.21 - -  Systolic 194 174 081  Diastolic 74 82 84  Pulse 448 84 70  Some encounter information is confidential and restricted. Go to Review Flowsheets activity to see all data.   CONSTITUTIONAL: Well-developed and well-nourished. No acute distress.  SKIN: Skin is warm and dry. No rash noted. No cyanosis. No pallor. No jaundice HEAD: Normocephalic and atraumatic.  EYES: No scleral icterus MOUTH/THROAT: Moist oral membranes.  NECK: No JVD present. No thyromegaly noted. No carotid bruits  LYMPHATIC: No visible cervical adenopathy.  CHEST Normal respiratory effort. No intercostal retractions  LUNGS: Clear to auscultation bilaterally.  No stridor. No wheezes. No rales.  CARDIOVASCULAR: Regular, tachycardic, positive S1-S2, no murmurs rubs or gallops appreciated. ABDOMINAL: Nonobese, soft, nontender, nondistended, positive bowel sounds in all 4 quadrants, no apparent ascites.  EXTREMITIES: No peripheral edema  HEMATOLOGIC: No significant bruising NEUROLOGIC: Oriented to person, place, and time. Nonfocal. Normal muscle tone.  PSYCHIATRIC: Normal mood and affect. Normal behavior. Cooperative  CARDIAC DATABASE: EKG: 06/13/2020: Normal sinus rhythm, 91 bpm, normal axis, without  underlying ischemia or injury pattern  Echocardiogram: None  Stress Testing: None  Heart Catheterization: None  LABORATORY DATA: External Labs: Collected: 05/21/2020 Creatinine 1.45 mg/dL. eGFR: 38 mL/min per 1.73 m Potassium 3.2, sodium 132, chloride 94, bicarb 25 Lipid profile: Total cholesterol 123, triglycerides 102, HDL 39, LDL 65, non-HDL 84 Hemoglobin: 10.6 g/dL Platelets 459K TSH: 2.31   IMPRESSION:    ICD-10-CM   1. Palpitations  R00.2 EKG 12-Lead    LONG TERM MONITOR (3-14 DAYS)    Basic metabolic panel    Magnesium  2. Dyspnea on exertion  R06.00 PCV ECHOCARDIOGRAM COMPLETE    PCV MYOCARDIAL PERFUSION WO LEXISCAN    B Nat Peptide  3. Benign hypertension with CKD (chronic kidney disease) stage III  J85.6 Basic metabolic panel   D14.97 Magnesium  4. Lupus ([place])  M32.9   5. History of CVA (cerebrovascular accident)  Z86.73   6. History of pulmonary embolism  Z86.711   7. Long term (current) use of anticoagulants  Z79.01      RECOMMENDATIONS: Lauren Chung is a 54 y.o. female whose past medical history and cardiac risk factors include: Hx of pulmonary embolism, hx of CVA, Lupus, HLD, HTN,  CKD, iron deficiency anemia.   Palpitations:  Patient symptoms of palpitation may be secondary to underlying sinus tachycardia.  However cannot rule out dysrhythmias.  Therefore recommended 7-day extended Holter monitor.  Plan echocardiogram to evaluate for structural heart disease.  Had a long discussion with the patient regarding the noncardiac causes of palpitations.  She is currently on long-term oral anticoagulation given her history of pulmonary embolism.  Patient's hemoglobin is consistent with anemia and also carries a diagnosis of iron deficiency.  She does not endorse any evidence of bleeding. But would recommend possible gastroenterology evaluation for possible endoscopy.  Will defer anemia management to primary care.  Patient is also known to have hypokalemia  and is currently on potassium supplements.  Most recent blood work also continues to show hypokalemia.  I suspect that she may also have concomitant hypomagnesemia.  Will check BMP and magnesium level.  Dyspnea on exertion:  Patient symptoms of effort later dyspnea may be secondary to underlying palpitations and or anemia.  However given her multiple cardiovascular risk factors ischemia cannot be ruled out.  EKG shows normal sinus rhythm with nonspecific ST-T changes in the anteroseptal leads which may be secondary to incomplete right bundle branch block but cannot rule out ischemia.  Echocardiogram will be ordered to evaluate for structural heart disease and left ventricular systolic function.  Exercise nuclear stress test recommended to evaluate for reversible ischemia.  Hypertension with chronic kidney disease stage III: Office blood pressure is well controlled and currently at goal.  Currently managed by PCP.  Medications reconciled.  Mixed hyperlipidemia: Currently on rosuvastatin.  Currently managed by PCP.  FINAL MEDICATION LIST END OF ENCOUNTER: No orders of the defined types were placed in this encounter.   Current Outpatient Medications:  .  alprazolam (XANAX) 2 MG tablet, Take 1 tablet (2 mg total) by mouth at bedtime., Disp: 90 tablet, Rfl: 3 .  nortriptyline (PAMELOR) 25 MG capsule, Take 1 capsule (25 mg total) by mouth at bedtime., Disp: 30 capsule, Rfl: 5 .  potassium chloride (K-DUR) 10 MEQ tablet, Take 20 mEq by mouth once. , Disp: , Rfl:  .  rosuvastatin (CRESTOR) 10 MG tablet, Take 10 mg by mouth at bedtime. , Disp: , Rfl:  .  triamterene-hydrochlorothiazide (MAXZIDE-25) 37.5-25 MG tablet, Take 1 tablet by mouth daily., Disp: , Rfl:  .  warfarin (COUMADIN) 3 MG tablet, Take 3 mg by mouth daily. , Disp: , Rfl:  .  zolpidem (AMBIEN CR) 12.5 MG CR tablet, Take 1 tablet (12.5 mg total) by mouth at bedtime., Disp: 30 tablet, Rfl: 5  Orders Placed This Encounter  Procedures   . Basic metabolic panel  . Magnesium  . B Nat Peptide  . PCV MYOCARDIAL PERFUSION WO LEXISCAN  . LONG TERM MONITOR (3-14 DAYS)  . EKG 12-Lead  . PCV ECHOCARDIOGRAM COMPLETE    There are no Patient Instructions on file for this visit.   --Continue cardiac medications as reconciled in final medication list. --Return in about 4 weeks (around 07/11/2020) for re-evaluation of symptoms pa;pitation and test results. . Or sooner if needed. --Continue follow-up with your primary care physician regarding the management of your other chronic comorbid conditions.  Total time spent: 60 minutes.  Reviewed independently reviewed outside records and labs, evaluation of EKG, counseling regarding her symptoms and differential diagnoses, coordination of care.   Patient's questions and concerns were addressed to her satisfaction. She voices understanding of the instructions provided during this encounter.   This note was  created using a voice recognition software as a result there may be grammatical errors inadvertently enclosed that do not reflect the nature of this encounter. Every attempt is made to correct such errors.  Rex Kras, [place], [facility]  Pager: [phone] Office: [phone]

## 2020-06-14 LAB — BASIC METABOLIC PANEL
BUN/Creatinine Ratio: 9 (ref 9–23)
BUN: 13 mg/dL (ref 6–24)
CO2: 24 mmol/L (ref 20–29)
Calcium: 9.2 mg/dL (ref 8.7–10.2)
Chloride: 92 mmol/L — ABNORMAL LOW (ref 96–106)
Creatinine, Ser: 1.5 mg/dL — ABNORMAL HIGH (ref 0.57–1.00)
GFR calc Af Amer: 46 mL/min/{1.73_m2} — ABNORMAL LOW (ref >59)
GFR calc non Af Amer: 39 mL/min/{1.73_m2} — ABNORMAL LOW (ref >59)
Glucose: 92 mg/dL (ref 65–99)
Potassium: 3.1 mmol/L — ABNORMAL LOW (ref 3.5–5.2)
Sodium: 134 mmol/L (ref 134–144)

## 2020-06-14 LAB — BRAIN NATRIURETIC PEPTIDE: BNP: 2.6 pg/mL (ref 0.0–100.0)

## 2020-06-14 LAB — MAGNESIUM: Magnesium: 2.2 mg/dL (ref 1.6–2.3)

## 2020-06-15 DIAGNOSIS — R002 Palpitations: Secondary | ICD-10-CM | POA: Diagnosis not present

## 2020-06-19 ENCOUNTER — Telehealth: Payer: Self-pay

## 2020-06-19 NOTE — Telephone Encounter (Signed)
-----   Message from [place], [place] sent at 06/17/2020 11:47 AM EDT ----- Please inform the patient that her kidney function is relatively stable but she does have underlying chronic kidney disease.Patient is noted to have low potassium levels and her most recent potassium level was 3.1 which is lower than normal limit.  She is currently taking 20 mEq of potassium per day.  Have her increase this to 20 mEq twice daily and follow-up with her PCP.    Please also send a copy of the most recent blood work and recommendations to her PCP for coordination of care.  We will defer further work-up for hypokalemia to her PCP at this time.

## 2020-06-19 NOTE — Telephone Encounter (Signed)
-----   Message from [place], [place] sent at 06/17/2020 11:47 AM EDT ----- Please inform the patient that her kidney function is relatively stable but she does have underlying chronic kidney disease.Patient is noted to have low potassium levels and her most recent potassium level was 3.1 which is lower than normal limit.  She is currently taking 20 mEq of potassium per day.  Have her increase this to 20 mEq twice daily and follow-up with her PCP.    Please also send a copy of the most recent blood work and recommendations to her PCP for coordination of care.  We will defer further work-up for hypokalemia to her PCP at this time.

## 2020-06-19 NOTE — Telephone Encounter (Signed)
Called pt to inform her about her lab results. And to go back to her pcp. Pt understood

## 2020-06-19 NOTE — Progress Notes (Signed)
Copy of labs, last progress note, and telephone encounter for lab results were sent to PCP.

## 2020-06-19 NOTE — Telephone Encounter (Signed)
Called pt no answer, left a vm to call back

## 2020-06-25 ENCOUNTER — Other Ambulatory Visit: Payer: Self-pay

## 2020-06-25 ENCOUNTER — Ambulatory Visit: Payer: Medicare Other

## 2020-06-25 DIAGNOSIS — R0609 Other forms of dyspnea: Secondary | ICD-10-CM

## 2020-06-27 ENCOUNTER — Ambulatory Visit: Payer: Medicare Other

## 2020-06-27 ENCOUNTER — Other Ambulatory Visit: Payer: Self-pay

## 2020-06-27 DIAGNOSIS — R0609 Other forms of dyspnea: Secondary | ICD-10-CM | POA: Diagnosis not present

## 2020-07-05 ENCOUNTER — Other Ambulatory Visit (HOSPITAL_COMMUNITY): Payer: Self-pay | Admitting: *Deleted

## 2020-07-05 DIAGNOSIS — E876 Hypokalemia: Secondary | ICD-10-CM | POA: Diagnosis not present

## 2020-07-05 DIAGNOSIS — Z7901 Long term (current) use of anticoagulants: Secondary | ICD-10-CM | POA: Diagnosis not present

## 2020-07-05 DIAGNOSIS — Z86711 Personal history of pulmonary embolism: Secondary | ICD-10-CM | POA: Diagnosis not present

## 2020-07-05 MED ORDER — ZOLPIDEM TARTRATE ER 12.5 MG PO TBCR
12.5000 mg | EXTENDED_RELEASE_TABLET | Freq: Every day | ORAL | 0 refills | Status: DC
Start: 2020-07-05 — End: 2020-08-17

## 2020-07-05 MED ORDER — ALPRAZOLAM 2 MG PO TABS
2.0000 mg | ORAL_TABLET | Freq: Every day | ORAL | 0 refills | Status: DC
Start: 2020-07-05 — End: 2020-08-23

## 2020-07-06 DIAGNOSIS — R002 Palpitations: Secondary | ICD-10-CM | POA: Diagnosis not present

## 2020-07-11 ENCOUNTER — Encounter: Payer: Self-pay | Admitting: Cardiology

## 2020-07-11 ENCOUNTER — Ambulatory Visit: Payer: Medicare Other | Admitting: Cardiology

## 2020-07-11 ENCOUNTER — Other Ambulatory Visit: Payer: Self-pay

## 2020-07-11 VITALS — BP 112/73 | HR 100 | Ht 68.0 in | Wt 182.0 lb

## 2020-07-11 DIAGNOSIS — Z8673 Personal history of transient ischemic attack (TIA), and cerebral infarction without residual deficits: Secondary | ICD-10-CM

## 2020-07-11 DIAGNOSIS — I129 Hypertensive chronic kidney disease with stage 1 through stage 4 chronic kidney disease, or unspecified chronic kidney disease: Secondary | ICD-10-CM

## 2020-07-11 DIAGNOSIS — R0609 Other forms of dyspnea: Secondary | ICD-10-CM

## 2020-07-11 DIAGNOSIS — Z7901 Long term (current) use of anticoagulants: Secondary | ICD-10-CM

## 2020-07-11 DIAGNOSIS — Z86711 Personal history of pulmonary embolism: Secondary | ICD-10-CM | POA: Diagnosis not present

## 2020-07-11 DIAGNOSIS — R002 Palpitations: Secondary | ICD-10-CM | POA: Diagnosis not present

## 2020-07-11 DIAGNOSIS — Z712 Person consulting for explanation of examination or test findings: Secondary | ICD-10-CM | POA: Diagnosis not present

## 2020-07-11 DIAGNOSIS — M329 Systemic lupus erythematosus, unspecified: Secondary | ICD-10-CM | POA: Diagnosis not present

## 2020-07-11 DIAGNOSIS — R0600 Dyspnea, unspecified: Secondary | ICD-10-CM | POA: Diagnosis not present

## 2020-07-11 DIAGNOSIS — R9431 Abnormal electrocardiogram [ECG] [EKG]: Secondary | ICD-10-CM | POA: Diagnosis not present

## 2020-07-11 DIAGNOSIS — N183 Chronic kidney disease, stage 3 unspecified: Secondary | ICD-10-CM | POA: Diagnosis not present

## 2020-07-11 MED ORDER — METOPROLOL SUCCINATE ER 25 MG PO TB24: 25.0000 mg | ORAL_TABLET | Freq: Every morning | ORAL | 0 refills | Status: DC

## 2020-07-11 NOTE — Progress Notes (Signed)
Date:  07/11/2020   ID:  Lauren Chung, DOB [birth year], MRN [id number]  PCP:  Antony Contras, MD  Cardiologist:  Rex Kras, DO, [facility] (established care 06/13/2020)   Chief Complaint  Patient presents with  . Palpitations    test results  . Follow-up    HPI  Lauren Chung is a 54 y.o. female who presents to the office with a chief complaint of "palpitations and review test results." Patient's past medical history and cardiovascular risk factors include: Hx of pulmonary embolism, hx of CVA, Lupus, HLD, HTN, CKD, iron deficiency anemia.   Patient is referred to the office at the request of her primary care provider for evaluation and management of palpitations.  Since last office visit patient states that she continues to have symptoms of palpitation.  The symptoms occur with day-to-day activities such as cooking cleaning showering, going up and down the stairs.  It improves with resting and the symptoms are usually brought on by effort.  They last for about 10 to 15 minutes.  Patient does not consume caffeine, soda, energy drinks, no new over-the-counter medications, herbal supplements, no stimulant drugs.  No known thyroid disease most recent TSH level reviewed noted below.  She has underlying iron deficiency anemia and is currently on long-term oral anticoagulation given her history of PE.  No consumption of alcohol.  In addition, the last office visit patient was also complaining of effort related dyspnea.  Given her multiple cardiovascular risk factors and symptoms patient was recommended to undergo an echo and stress test.  Echo notes preserved left ventricular systolic function with grade 1 diastolic impairment and no significant valvular heart disease.  Nuclear stress test notes overall normal perfusion with soft tissue attenuation and no overt findings of reversible ischemia.  Low risk study.  Patient had a 7-day extended Holter monitor to evaluate for dysrhythmias given her symptoms  of palpitations as discussed above.  Patient was noted to have an average heart rate of 96 bpm.  No significant dysrhythmias.  Patient states that she sleeps between 11:30 PM to 9 AM every day.  Patient was made aware of 2 episodes during this period of time.  On June 17, 2020 at approximately 7:49 AM patient had underlying sinus rhythm with high degree AV block.  Patient also had another episode on June 15 2020 at 3:26 AM where the underlying rhythm was sinus with Mobitz type I AV block.  Both of these events were auto triggered and during the time when she was sleeping.    FUNCTIONAL STATUS: No structured exercise program or daily routine.   ALLERGIES: Allergies  Allergen Reactions  . Codeine Shortness Of Breath  . Contrast Media [Iodinated Diagnostic Agents] Shortness Of Breath  . Tylenol [Acetaminophen] Nausea And Vomiting    MEDICATION LIST PRIOR TO VISIT: Current Meds  Medication Sig  . alprazolam (XANAX) 2 MG tablet Take 1 tablet (2 mg total) by mouth at bedtime.  . nortriptyline (PAMELOR) 25 MG capsule Take 1 capsule (25 mg total) by mouth at bedtime.  . potassium chloride (K-DUR) 10 MEQ tablet Take 20 mEq by mouth once.   . rosuvastatin (CRESTOR) 10 MG tablet Take 10 mg by mouth at bedtime.   . triamterene-hydrochlorothiazide (MAXZIDE-25) 37.5-25 MG tablet Take 1 tablet by mouth daily.  Marland Kitchen warfarin (COUMADIN) 3 MG tablet Take 3 mg by mouth daily.   Marland Kitchen zolpidem (AMBIEN CR) 12.5 MG CR tablet Take 1 tablet (12.5 mg total) by mouth at bedtime.  PAST MEDICAL HISTORY: Past Medical History:  Diagnosis Date  . Anemia   . Anxiety   . Depression   . History of blood transfusion    in [place]  . Hyperlipidemia   . Hypertension   . Lupus ([place])   . PE (pulmonary embolism)   . Pneumonia    x 3 - last time 2017  . PTSD (post-traumatic stress disorder)   . Stroke [facility])    many years ago per patient, no deficits  . SVD (spontaneous vaginal delivery)    x 3  . TIA (transient  ischemic attack)    many years ago per patient, no deficits    PAST SURGICAL HISTORY: Past Surgical History:  Procedure Laterality Date  . ABDOMINAL SURGERY    . ABLATION     gyn procedure for bleeding  . cyst removed     right leg with sedation/anesthesia  . EXCISION MASS HEAD N/A 08/05/2019   Procedure: EXCISION OF SCALP CYST;  Surgeon: Izora Gala, MD;  Location: [place];  Service: ENT;  Laterality: N/A;    FAMILY HISTORY: The patient family history includes Alcohol abuse in her mother; Drug abuse in her mother; Leukemia in her father; Seizures in her daughter.  SOCIAL HISTORY:  The patient  reports that she has never smoked. She has never used smokeless tobacco. She reports that she does not drink alcohol and does not use drugs.  REVIEW OF SYSTEMS: Review of Systems  Constitutional: Negative for chills and fever.  HENT: Negative for hoarse voice and nosebleeds.   Eyes: Negative for discharge, double vision and pain.  Cardiovascular: Positive for dyspnea on exertion and palpitations. Negative for chest pain, claudication, leg swelling, near-syncope, orthopnea, paroxysmal nocturnal dyspnea and syncope.  Respiratory: Negative for hemoptysis and shortness of breath.   Musculoskeletal: Negative for muscle cramps and myalgias.  Gastrointestinal: Negative for abdominal pain, constipation, diarrhea, hematemesis, hematochezia, melena, nausea and vomiting.  Neurological: Negative for dizziness and light-headedness.    PHYSICAL EXAM: Vitals with BMI 07/11/2020 06/13/2020 08/05/2019  Height _0  _1  -  Weight 182 lbs 180 lbs -  BMI 22.02 54.27 -  Systolic 062 376 283  Diastolic 73 74 82  Pulse 151 106 84  Some encounter information is confidential and restricted. Go to Review Flowsheets activity to see all data.   CONSTITUTIONAL: Well-developed and well-nourished. No acute distress.  SKIN: Skin is warm and dry. No rash noted. No cyanosis. No pallor. No  jaundice HEAD: Normocephalic and atraumatic.  EYES: No scleral icterus MOUTH/THROAT: Moist oral membranes.  NECK: No JVD present. No thyromegaly noted. No carotid bruits  LYMPHATIC: No visible cervical adenopathy.  CHEST Normal respiratory effort. No intercostal retractions  LUNGS: Clear to auscultation bilaterally.  No stridor. No wheezes. No rales.  CARDIOVASCULAR: Regular, tachycardic, positive S1-S2, no murmurs rubs or gallops appreciated. ABDOMINAL: Nonobese, soft, nontender, nondistended, positive bowel sounds in all 4 quadrants, no apparent ascites.  EXTREMITIES: No peripheral edema  HEMATOLOGIC: No significant bruising NEUROLOGIC: Oriented to person, place, and time. Nonfocal. Normal muscle tone.  PSYCHIATRIC: Normal mood and affect. Normal behavior. Cooperative  CARDIAC DATABASE: EKG: 06/13/2020: Normal sinus rhythm, 91 bpm, normal axis, without underlying ischemia or injury pattern  Echocardiogram: 06/27/2020: Left ventricle cavity is normal in size. Moderate concentric hypertrophy of the left ventricle. Normal global wall motion. Normal LV systolic function with EF 56%. Doppler evidence of grade I (impaired) diastolic dysfunction, normal LAP.  No significant valvular abnormality. IVC is normal with  blunted respiratory response. Estimated RA pressure 8 mmHg.  Stress Testing: Exercise tetrofosmin stress test 06/25/2020: Normal ECG stress. The patient exercised for 5 minutes and 29 seconds of a Bruce protocol, achieving approximately 6.64 METs. Exercise capacity reduced for age. Normal BP response.  Mild degree medium extent perfusion defect consistent with soft tissue attenuation in the apical inferior wall, mid inferior wall and basal inferior wall of left ventricle.  Overall LV systolic function is normal without regional wall motion abnormalities. Stress LV EF: 63%.  No previous exam available for comparison. Low risk study.   Heart Catheterization: None  7 day extended  Holter monitor:  Dominant rhythm normal sinus.  Heart rate 66-154 bpm. Average HR 96 bpm.  No atrial fibrillation/nonsustained ventricular tachycardia/high grade AV block, sinus pause greater than or equal to 3 seconds in duration.  Total ventricular ectopic burden <1%.  Total supraventricular ectopic burden <1%.  Number of patient triggered events: 9. Predominantly underlying rhythm sinus tachycardia (7 episodes) and 2 episodes of normal sinus rhythm with rare VE.  Auto triggered event on 06/17/2020 at 7:49am patient had sinus rhythm with high degree AV block.  Auto triggered event on 06/15/2020 at 3:27am patient had sinus rhythm with second degree type I AV block.   LABORATORY DATA: External Labs: Collected: 05/21/2020 Creatinine 1.45 mg/dL. eGFR: 38 mL/min per 1.73 m Potassium 3.2, sodium 132, chloride 94, bicarb 25 Lipid profile: Total cholesterol 123, triglycerides 102, HDL 39, LDL 65, non-HDL 84 Hemoglobin: 10.6 g/dL Platelets 459K TSH: 2.31   IMPRESSION:    ICD-10-CM   1. Palpitations  R00.2 metoprolol succinate (TOPROL XL) 25 MG 24 hr tablet  2. Dyspnea on exertion  R06.00   3. Encounter to discuss test results  Z71.2   4. Benign hypertension with CKD (chronic kidney disease) stage III  I12.9    N18.30   5. Lupus ([place])  M32.9   6. History of CVA (cerebrovascular accident)  Z86.73   7. History of pulmonary embolism  Z86.711   8. Long term (current) use of anticoagulants  Z79.01   9. Abnormal Holter exam  R94.31      RECOMMENDATIONS: Huntley Demedeiros is a 54 y.o. female whose past medical history and cardiac risk factors include: Hx of pulmonary embolism, hx of CVA, Lupus, HLD, HTN, CKD, iron deficiency anemia.   Palpitations:  Patient symptoms of palpitations continue since last office visit.  Patient has undergone ischemic evaluation including an echocardiogram and stress test results of which were reviewed with her in great detail at today's office visit and noted  above for further reference.  Patient also underwent a 7-day extended Holter monitor which did not note any significant dysrhythmias.  The patient triggered events were associated predominantly sinus tachycardia.  She had 2 auto triggered events 1 which noted sinus rhythm with high degree AV block and the other event was sinus rhythm with Wenckebach phenomenon both of these episodes occurred during the time when the patient sleeps.  Patient states that she sleeps between the hours of 11:30 PM to 9 AM.  Since they were discovered during the hours when she is sleeping there may be underlying sleep apnea which is undetected.  I have asked her to discuss possibly seeing sleep medicine with her PCP for sleep study to rule out either obstructive or central sleep apnea.  In regards to her symptoms of palpitation I still think patient will benefit with low-dose beta-blocker therapy.  We will start Toprol-XL 25 mg p.o. daily and reevaluate  in 1 month.  Anemia work-up deferred to PCP.  Patient was hypokalemic and therefore was recommended to increase her potassium supplement to 20 mEq twice daily and to follow-up with PCP.  In the setting of hypertension and hypokalemia patient may benefit from discontinuation of Maxide and substituting it for Aldactone.  Will defer this to her PCP at this time.  Patient is also recommended to follow-up with PCP in regards to her underlying electrolyte abnormalities.  Dyspnea on exertion:  Presents to be chronic and stable.  Patient underwent an ischemic evaluation which is overall unremarkable.  Would recommend GI evaluation for possible underlying anemia which may be contributing to her sinus tachycardia and in turn causing effort related dyspnea.  We will continue to monitor and defer noncardiac management to PCP.  Hypertension with chronic kidney disease stage III: Office blood pressure is well controlled and currently at goal.  Currently managed by PCP.  Medications  reconciled.  Mixed hyperlipidemia: Currently on rosuvastatin.  Currently managed by PCP.  FINAL MEDICATION LIST END OF ENCOUNTER: Meds ordered this encounter  Medications  . metoprolol succinate (TOPROL XL) 25 MG 24 hr tablet    Sig: Take 1 tablet (25 mg total) by mouth in the morning.    Dispense:  30 tablet    Refill:  0    Current Outpatient Medications:  .  alprazolam (XANAX) 2 MG tablet, Take 1 tablet (2 mg total) by mouth at bedtime., Disp: 45 tablet, Rfl: 0 .  nortriptyline (PAMELOR) 25 MG capsule, Take 1 capsule (25 mg total) by mouth at bedtime., Disp: 30 capsule, Rfl: 5 .  potassium chloride (K-DUR) 10 MEQ tablet, Take 20 mEq by mouth once. , Disp: , Rfl:  .  rosuvastatin (CRESTOR) 10 MG tablet, Take 10 mg by mouth at bedtime. , Disp: , Rfl:  .  triamterene-hydrochlorothiazide (MAXZIDE-25) 37.5-25 MG tablet, Take 1 tablet by mouth daily., Disp: , Rfl:  .  warfarin (COUMADIN) 3 MG tablet, Take 3 mg by mouth daily. , Disp: , Rfl:  .  zolpidem (AMBIEN CR) 12.5 MG CR tablet, Take 1 tablet (12.5 mg total) by mouth at bedtime., Disp: 45 tablet, Rfl: 0 .  metoprolol succinate (TOPROL XL) 25 MG 24 hr tablet, Take 1 tablet (25 mg total) by mouth in the morning., Disp: 30 tablet, Rfl: 0  No orders of the defined types were placed in this encounter.   There are no Patient Instructions on file for this visit.   --Continue cardiac medications as reconciled in final medication list. --Return in about 4 weeks (around 08/08/2020) for Reevaluation of palpitations. Or sooner if needed. --Continue follow-up with your primary care physician regarding the management of your other chronic comorbid conditions.  Total time spent: 60 minutes.  Reviewed independently reviewed outside records and labs, evaluation of EKG, counseling regarding her symptoms and differential diagnoses, coordination of care.   Patient's questions and concerns were addressed to her satisfaction. She voices understanding of  the instructions provided during this encounter.   This note was created using a voice recognition software as a result there may be grammatical errors inadvertently enclosed that do not reflect the nature of this encounter. Every attempt is made to correct such errors.  Rex Kras, [place], [facility]  Pager: [phone] Office: [phone]

## 2020-08-02 ENCOUNTER — Other Ambulatory Visit: Payer: Self-pay | Admitting: Cardiology

## 2020-08-02 DIAGNOSIS — Z86711 Personal history of pulmonary embolism: Secondary | ICD-10-CM | POA: Diagnosis not present

## 2020-08-02 DIAGNOSIS — R002 Palpitations: Secondary | ICD-10-CM

## 2020-08-02 DIAGNOSIS — E876 Hypokalemia: Secondary | ICD-10-CM | POA: Diagnosis not present

## 2020-08-02 DIAGNOSIS — Z7901 Long term (current) use of anticoagulants: Secondary | ICD-10-CM | POA: Diagnosis not present

## 2020-08-08 ENCOUNTER — Other Ambulatory Visit: Payer: Self-pay

## 2020-08-08 DIAGNOSIS — R002 Palpitations: Secondary | ICD-10-CM

## 2020-08-08 MED ORDER — METOPROLOL SUCCINATE ER 25 MG PO TB24: 25.0000 mg | ORAL_TABLET | Freq: Every morning | ORAL | 1 refills | Status: DC

## 2020-08-09 ENCOUNTER — Ambulatory Visit: Payer: Medicare Other | Admitting: Cardiology

## 2020-08-14 ENCOUNTER — Ambulatory Visit: Payer: Medicare Other | Admitting: Cardiology

## 2020-08-14 ENCOUNTER — Encounter: Payer: Self-pay | Admitting: Cardiology

## 2020-08-14 ENCOUNTER — Other Ambulatory Visit: Payer: Self-pay

## 2020-08-14 VITALS — BP 119/76 | HR 83 | Resp 17 | Ht 68.0 in | Wt 190.0 lb

## 2020-08-14 DIAGNOSIS — I129 Hypertensive chronic kidney disease with stage 1 through stage 4 chronic kidney disease, or unspecified chronic kidney disease: Secondary | ICD-10-CM | POA: Diagnosis not present

## 2020-08-14 DIAGNOSIS — N183 Chronic kidney disease, stage 3 unspecified: Secondary | ICD-10-CM

## 2020-08-14 DIAGNOSIS — R002 Palpitations: Secondary | ICD-10-CM | POA: Diagnosis not present

## 2020-08-14 DIAGNOSIS — R0609 Other forms of dyspnea: Secondary | ICD-10-CM | POA: Diagnosis not present

## 2020-08-14 DIAGNOSIS — Z7901 Long term (current) use of anticoagulants: Secondary | ICD-10-CM

## 2020-08-14 DIAGNOSIS — M329 Systemic lupus erythematosus, unspecified: Secondary | ICD-10-CM

## 2020-08-14 DIAGNOSIS — Z8673 Personal history of transient ischemic attack (TIA), and cerebral infarction without residual deficits: Secondary | ICD-10-CM | POA: Diagnosis not present

## 2020-08-14 DIAGNOSIS — Z86711 Personal history of pulmonary embolism: Secondary | ICD-10-CM

## 2020-08-14 DIAGNOSIS — IMO0002 Reserved for concepts with insufficient information to code with codable children: Secondary | ICD-10-CM

## 2020-08-14 MED ORDER — METOPROLOL SUCCINATE ER 25 MG PO TB24: 25.0000 mg | ORAL_TABLET | Freq: Every morning | ORAL | 1 refills | Status: DC

## 2020-08-14 NOTE — Progress Notes (Signed)
Date:  08/14/2020   ID:  Lauren Chung, DOB February 09, 1966, MRN [id number]  PCP:  Antony Contras, MD  Cardiologist:  Rex Kras, DO, [facility] (established care 06/13/2020)   Chief Complaint  Patient presents with  . Palpitations  . Follow-up    4 week    HPI  Lauren Chung is a 54 y.o. female who presents to the office with a chief complaint of "palpitations and review test results." Patient's past medical history and cardiovascular risk factors include: Hx of pulmonary embolism, hx of CVA, Lupus, HLD, HTN, CKD, iron deficiency anemia.   Patient is referred to the office at the request of her primary care provider for evaluation and management of palpitations.  Since establishing care patient has extensive patient from a cardiovascular standpoint.  She has undergone an echocardiogram, stress test, and Holter monitor.  These results were reviewed with her in great detail during prior office visits.  At last office visit the shared decision was to proceed with initiation of Toprol-XL and to reevaluate her symptoms in 1 month.  Today she presents for follow-up and states that she is doing excellent.  Patient states that her symptoms of palpitation have resolved.  And her symptoms of dyspnea on exertion have improved significantly by at least 90%.  At the last office visit patient was made aware that she had 2 episodes that were auto triggered and noted the underlying rhythm to be sinus with one episode noting high degree and the other noting Wenckebach phenomenon.  Please believe these were auto triggered events during the time when she was asleep I recommended that she discuss undergoing sleep evaluation with her PCP.  Patient states that she still has not been referred to sleep medicine but does have an upcoming appointment with her PCP and will rediscuss it.  In the past patient is also noted that she has underlying hypokalemia since she had been on Maxide therapy.  She is also on potassium  supplement to help address that.  I recommended that she discuss transitioning to possible Aldactone when she followed up with her PCP.  FUNCTIONAL STATUS: No structured exercise program or daily routine.   ALLERGIES: Allergies  Allergen Reactions  . Codeine Shortness Of Breath  . Contrast Media [Iodinated Diagnostic Agents] Shortness Of Breath  . Tylenol [Acetaminophen] Nausea And Vomiting    MEDICATION LIST PRIOR TO VISIT: Current Meds  Medication Sig  . alprazolam (XANAX) 2 MG tablet Take 1 tablet (2 mg total) by mouth at bedtime.  . metoprolol succinate (TOPROL-XL) 25 MG 24 hr tablet Take 1 tablet (25 mg total) by mouth in the morning.  . nortriptyline (PAMELOR) 25 MG capsule TAKE 1 CAPSULE (25 MG TOTAL) BY MOUTH AT BEDTIME.  Marland Kitchen potassium chloride (K-DUR) 10 MEQ tablet Take 20 mEq by mouth once.   . rosuvastatin (CRESTOR) 10 MG tablet Take 10 mg by mouth at bedtime.   . triamterene-hydrochlorothiazide (MAXZIDE-25) 37.5-25 MG tablet Take 1 tablet by mouth daily.  Marland Kitchen warfarin (COUMADIN) 2 MG tablet Take 3 mg by mouth daily.   Marland Kitchen zolpidem (AMBIEN CR) 12.5 MG CR tablet Take 1 tablet (12.5 mg total) by mouth at bedtime.  . [DISCONTINUED] metoprolol succinate (TOPROL-XL) 25 MG 24 hr tablet Take 1 tablet (25 mg total) by mouth in the morning.     PAST MEDICAL HISTORY: Past Medical History:  Diagnosis Date  . Anemia   . Anxiety   . Depression   . History of blood transfusion    in  [place]  . Hyperlipidemia   . Hypertension   . Lupus ([place])   . PE (pulmonary embolism)   . Pneumonia    x 3 - last time 2017  . PTSD (post-traumatic stress disorder)   . Stroke [facility])    many years ago per patient, no deficits  . SVD (spontaneous vaginal delivery)    x 3  . TIA (transient ischemic attack)    many years ago per patient, no deficits    PAST SURGICAL HISTORY: Past Surgical History:  Procedure Laterality Date  . ABDOMINAL SURGERY    . ABLATION     gyn procedure for bleeding  . cyst  removed     right leg with sedation/anesthesia  . EXCISION MASS HEAD N/A 08/05/2019   Procedure: EXCISION OF SCALP CYST;  Surgeon: Izora Gala, MD;  Location: [place];  Service: ENT;  Laterality: N/A;    FAMILY HISTORY: The patient family history includes Alcohol abuse in her mother; Drug abuse in her mother; Leukemia in her father; Seizures in her daughter.  SOCIAL HISTORY:  The patient  reports that she has never smoked. She has never used smokeless tobacco. She reports that she does not drink alcohol and does not use drugs.  REVIEW OF SYSTEMS: Review of Systems  Constitutional: Negative for chills and fever.  HENT: Negative for hoarse voice and nosebleeds.   Eyes: Negative for discharge, double vision and pain.  Cardiovascular: Positive for dyspnea on exertion (improving). Negative for chest pain, claudication, leg swelling, near-syncope, orthopnea, palpitations, paroxysmal nocturnal dyspnea and syncope.  Respiratory: Negative for hemoptysis and shortness of breath.   Musculoskeletal: Negative for muscle cramps and myalgias.  Gastrointestinal: Negative for abdominal pain, constipation, diarrhea, hematemesis, hematochezia, melena, nausea and vomiting.  Neurological: Negative for dizziness and light-headedness.    PHYSICAL EXAM: Vitals with BMI 08/14/2020 07/11/2020 06/13/2020  Height 5' 8"  5' 8"  5' 8"   Weight 190 lbs 182 lbs 180 lbs  BMI 28.9 40.35 24.81  Systolic 859 093 112  Diastolic 76 73 74  Pulse 83 100 106  Some encounter information is confidential and restricted. Go to Review Flowsheets activity to see all data.   CONSTITUTIONAL: Well-developed and well-nourished. No acute distress.  SKIN: Skin is warm and dry. No rash noted. No cyanosis. No pallor. No jaundice HEAD: Normocephalic and atraumatic.  EYES: No scleral icterus MOUTH/THROAT: Moist oral membranes.  NECK: No JVD present. No thyromegaly noted. No carotid bruits  LYMPHATIC: No visible cervical  adenopathy.  CHEST Normal respiratory effort. No intercostal retractions  LUNGS: Clear to auscultation bilaterally.  No stridor. No wheezes. No rales.  CARDIOVASCULAR: Regular, tachycardic, positive S1-S2, no murmurs rubs or gallops appreciated. ABDOMINAL: Nonobese, soft, nontender, nondistended, positive bowel sounds in all 4 quadrants, no apparent ascites.  EXTREMITIES: No peripheral edema  HEMATOLOGIC: No significant bruising NEUROLOGIC: Oriented to person, place, and time. Nonfocal. Normal muscle tone.  PSYCHIATRIC: Normal mood and affect. Normal behavior. Cooperative  CARDIAC DATABASE: EKG: 06/13/2020: Normal sinus rhythm, 91 bpm, normal axis, without underlying ischemia or injury pattern  Echocardiogram: 06/27/2020: Left ventricle cavity is normal in size. Moderate concentric hypertrophy of the left ventricle. Normal global wall motion. Normal LV systolic function with EF 56%. Doppler evidence of grade I (impaired) diastolic dysfunction, normal LAP.  No significant valvular abnormality. IVC is normal with blunted respiratory response. Estimated RA pressure 8 mmHg.  Stress Testing: Exercise tetrofosmin stress test 06/25/2020: Normal ECG stress. The patient exercised for 5 minutes and 29 seconds of  a Bruce protocol, achieving approximately 6.64 METs. Exercise capacity reduced for age. Normal BP response.  Mild degree medium extent perfusion defect consistent with soft tissue attenuation in the apical inferior wall, mid inferior wall and basal inferior wall of left ventricle.  Overall LV systolic function is normal without regional wall motion abnormalities. Stress LV EF: 63%.  No previous exam available for comparison. Low risk study.   Heart Catheterization: None  7 day extended Holter monitor:  Dominant rhythm normal sinus.  Heart rate 66-154 bpm. Average HR 96 bpm.  No atrial fibrillation/nonsustained ventricular tachycardia/high grade AV block, sinus pause greater than or  equal to 3 seconds in duration.  Total ventricular ectopic burden <1%.  Total supraventricular ectopic burden <1%.  Number of patient triggered events: 9. Predominantly underlying rhythm sinus tachycardia (7 episodes) and 2 episodes of normal sinus rhythm with rare VE.  Auto triggered event on 06/17/2020 at 7:49am patient had sinus rhythm with high degree AV block.  Auto triggered event on 06/15/2020 at 3:27am patient had sinus rhythm with second degree type I AV block.   LABORATORY DATA: External Labs: Collected: 05/21/2020 Creatinine 1.45 mg/dL. eGFR: 38 mL/min per 1.73 m Potassium 3.2, sodium 132, chloride 94, bicarb 25 Lipid profile: Total cholesterol 123, triglycerides 102, HDL 39, LDL 65, non-HDL 84 Hemoglobin: 10.6 g/dL Platelets 459K TSH: 2.31   IMPRESSION:    ICD-10-CM   1. Palpitations  R00.2 metoprolol succinate (TOPROL-XL) 25 MG 24 hr tablet  2. Dyspnea on exertion  R06.00   3. Benign hypertension with CKD (chronic kidney disease) stage III (HCC)  I12.9    N18.30   4. Lupus ([place])  M32.9   5. History of CVA (cerebrovascular accident)  Z86.73   6. History of pulmonary embolism  Z86.711   7. Long term (current) use of anticoagulants  Z79.01      RECOMMENDATIONS: Chanah Tidmore is a 54 y.o. female whose past medical history and cardiac risk factors include: Hx of pulmonary embolism, hx of CVA, Lupus, HLD, HTN, CKD, iron deficiency anemia.   Palpitations: Symptoms have resolved since last office visit initiation of Toprol-XL.  Patient requesting refill.  Continue to monitor symptoms.  Dyspnea on exertion: Improved.  Patient has undergone extensive cardiovascular evaluation including a monitor, echo and stress test.  Since initiation of Toprol-XL patient states her symptoms dyspnea on exertion have improved by at least 90%.  Recommended discussing possible noncardiac causes of effort related dyspnea.    Of note, patient is noted to have anemia and given the use of  long-term oral anticoagulation given her history of pulmonary embolism may consider additional work-up of anemia at the discretion of her PCP.  Holter monitor: Patient was noted to have 2 episodes that were auto triggered during which the underlying rhythm was sinus with either high degree AV block or Wenckebach phenomenon.  As recommended at last office visit she is reminded to see sleep medicine for possible sleep study to evaluate for sleep-related disorder.  Patient denies that she has an upcoming appointment with her PCP during which time she will discuss it.  Hypertension with chronic kidney disease stage III: Office blood pressure is well controlled and currently at goal.  Currently managed by PCP.  May consider transitioning from [facility] to Aldactone given her underlying hypokalemia as well.  Will defer to PCP at this time.  Mixed hyperlipidemia: Currently on rosuvastatin.  Currently managed by PCP.  FINAL MEDICATION LIST END OF ENCOUNTER: Meds ordered this encounter  Medications  .  metoprolol succinate (TOPROL-XL) 25 MG 24 hr tablet    Sig: Take 1 tablet (25 mg total) by mouth in the morning.    Dispense:  90 tablet    Refill:  1    Current Outpatient Medications:  .  alprazolam (XANAX) 2 MG tablet, Take 1 tablet (2 mg total) by mouth at bedtime., Disp: 45 tablet, Rfl: 0 .  metoprolol succinate (TOPROL-XL) 25 MG 24 hr tablet, Take 1 tablet (25 mg total) by mouth in the morning., Disp: 90 tablet, Rfl: 1 .  nortriptyline (PAMELOR) 25 MG capsule, TAKE 1 CAPSULE (25 MG TOTAL) BY MOUTH AT BEDTIME., Disp: 90 capsule, Rfl: 2 .  potassium chloride (K-DUR) 10 MEQ tablet, Take 20 mEq by mouth once. , Disp: , Rfl:  .  rosuvastatin (CRESTOR) 10 MG tablet, Take 10 mg by mouth at bedtime. , Disp: , Rfl:  .  triamterene-hydrochlorothiazide (MAXZIDE-25) 37.5-25 MG tablet, Take 1 tablet by mouth daily., Disp: , Rfl:  .  warfarin (COUMADIN) 2 MG tablet, Take 3 mg by mouth daily. , Disp: , Rfl:  .   zolpidem (AMBIEN CR) 12.5 MG CR tablet, Take 1 tablet (12.5 mg total) by mouth at bedtime., Disp: 45 tablet, Rfl: 0  No orders of the defined types were placed in this encounter.  There are no Patient Instructions on file for this visit.   --Continue cardiac medications as reconciled in final medication list. --Return in about 6 months (around 02/12/2021) for Reevaluation of palpitations. . Or sooner if needed. --Continue follow-up with your primary care physician regarding the management of your other chronic comorbid conditions.  Total time spent: 30 minutes.  Patient's questions and concerns were addressed to her satisfaction. She voices understanding of the instructions provided during this encounter.   This note was created using a voice recognition software as a result there may be grammatical errors inadvertently enclosed that do not reflect the nature of this encounter. Every attempt is made to correct such errors.  Rex Kras, [place], [facility]  Pager: [phone] Office: [phone]

## 2020-08-16 DIAGNOSIS — Z7901 Long term (current) use of anticoagulants: Secondary | ICD-10-CM | POA: Diagnosis not present

## 2020-08-16 DIAGNOSIS — Z86711 Personal history of pulmonary embolism: Secondary | ICD-10-CM | POA: Diagnosis not present

## 2020-08-17 ENCOUNTER — Other Ambulatory Visit: Payer: Self-pay

## 2020-08-17 ENCOUNTER — Telehealth (INDEPENDENT_AMBULATORY_CARE_PROVIDER_SITE_OTHER): Payer: Medicare Other | Admitting: Psychiatry

## 2020-08-17 DIAGNOSIS — F3342 Major depressive disorder, recurrent, in full remission: Secondary | ICD-10-CM | POA: Diagnosis not present

## 2020-08-17 MED ORDER — ZOLPIDEM TARTRATE ER 12.5 MG PO TBCR
12.5000 mg | EXTENDED_RELEASE_TABLET | Freq: Every day | ORAL | 0 refills | Status: DC
Start: 2020-08-17 — End: 2020-08-17

## 2020-08-17 MED ORDER — NORTRIPTYLINE HCL 25 MG PO CAPS
25.0000 mg | ORAL_CAPSULE | Freq: Every day | ORAL | 2 refills | Status: DC
Start: 2020-08-17 — End: 2021-01-22

## 2020-08-17 MED ORDER — ZOLPIDEM TARTRATE ER 12.5 MG PO TBCR
12.5000 mg | EXTENDED_RELEASE_TABLET | Freq: Every day | ORAL | 0 refills | Status: DC
Start: 2020-08-17 — End: 2020-09-10

## 2020-08-17 NOTE — Progress Notes (Signed)
Psychiatric Initial Adult Assessment   Patient Identification: Lauren Chung MRN:  [id number] Date of Evaluation:  08/17/2020 Referral Source [place] Officer Chief Complaint:    Visit Diagnosis: Major depression  Today the patient is doing great.  Her mood is very good.  She clearly benefits by being on Remeron.  She also takes Ambien at night for sleep.  Recently she saw her cardiologist and was begun on a beta-blocker.  For reasons that are not clear if she is getting a positive emotional response from.  Interestingly gives her more energy and she feels better.  The patient went to [facility] on vacation and had a great time.  Patient is looking forward to volunteering.  She is sleeping and eating very well.  She says she has gained a little bit of weight.  She does not drink alcohol.  She has no psychotic symptoms.  Her autistic son was in his 62s needs to have a new psychiatric provider.  We referred him to [facility].  The patient has no anxiety.  She is functioning very well.  Anxiety Symptoms:   Psychotic Symptoms:   PTSD Symptoms:   Past Psychiatric History: Past therapy, one psychiatric hospitalization  Previous Psychotropic Medications: Yes   Substance Abuse History in the last 12 months:  No.  Consequences of Substance Abuse: Negative  Past Medical History:  Past Medical History:  Diagnosis Date   Anemia    Anxiety    Depression    History of blood transfusion    in [place]   Hyperlipidemia    Hypertension    Lupus ([place])    PE (pulmonary embolism)    Pneumonia    x 3 - last time 2017   PTSD (post-traumatic stress disorder)    Stroke ([place])    many years ago per patient, no deficits   SVD (spontaneous vaginal delivery)    x 3   TIA (transient ischemic attack)    many years ago per patient, no deficits    Past Surgical History:  Procedure Laterality Date   ABDOMINAL SURGERY     ABLATION     gyn procedure for bleeding   cyst removed     right leg with  sedation/anesthesia   EXCISION MASS HEAD N/A 08/05/2019   Procedure: EXCISION OF SCALP CYST;  Surgeon: Izora Gala, MD;  Location: [place];  Service: ENT;  Laterality: N/A;    Family Psychiatric History:   Family History:  Family History  Problem Relation Age of Onset   Leukemia Father    Seizures Daughter    Drug abuse Mother    Alcohol abuse Mother     Social History:   Social History   Socioeconomic History   Marital status: Single    Spouse name: Not on file   Number of children: 3   Years of education: Not on file   Highest education level: Not on file  Occupational History   Not on file  Tobacco Use   Smoking status: Never Smoker   Smokeless tobacco: Never Used  Vaping Use   Vaping Use: Never used  Substance and Sexual Activity   Alcohol use: No   Drug use: No   Sexual activity: Yes    Partners: Male    Birth control/protection: Condom, Post-menopausal  Other Topics Concern   Not on file  Social History Narrative   Not on file   Social Determinants of Health   Financial Resource Strain:    Difficulty of Paying  Living Expenses: Not on file  Food Insecurity:    Worried About [place] in the Last Year: Not on file   Ran Out of Food in the Last Year: Not on file  Transportation Needs:    Lack of Transportation (Medical): Not on file   Lack of Transportation (Non-Medical): Not on file  Physical Activity:    Days of Exercise per Week: Not on file   Minutes of Exercise per Session: Not on file  Stress:    Feeling of Stress : Not on file  Social Connections:    Frequency of Communication with Friends and Family: Not on file   Frequency of Social Gatherings with Friends and Family: Not on file   Attends Religious Services: Not on file   Active Member of Clubs or Organizations: Not on file   Attends Archivist Meetings: Not on file   Marital Status: Not on file    Additional Social  History:   Allergies:   Allergies  Allergen Reactions   Codeine Shortness Of Breath   Contrast Media [Iodinated Diagnostic Agents] Shortness Of Breath   Tylenol [Acetaminophen] Nausea And Vomiting    Metabolic Disorder Labs: No results found for: HGBA1C, MPG No results found for: PROLACTIN No results found for: CHOL, TRIG, HDL, CHOLHDL, VLDL, LDLCALC   Current Medications: Current Outpatient Medications  Medication Sig Dispense Refill   alprazolam (XANAX) 2 MG tablet Take 1 tablet (2 mg total) by mouth at bedtime. 45 tablet 0   metoprolol succinate (TOPROL-XL) 25 MG 24 hr tablet Take 1 tablet (25 mg total) by mouth in the morning. 90 tablet 1   nortriptyline (PAMELOR) 25 MG capsule Take 1 capsule (25 mg total) by mouth at bedtime. 90 capsule 2   potassium chloride (K-DUR) 10 MEQ tablet Take 20 mEq by mouth once.      rosuvastatin (CRESTOR) 10 MG tablet Take 10 mg by mouth at bedtime.      triamterene-hydrochlorothiazide (MAXZIDE-25) 37.5-25 MG tablet Take 1 tablet by mouth daily.     warfarin (COUMADIN) 2 MG tablet Take 3 mg by mouth daily.      zolpidem (AMBIEN CR) 12.5 MG CR tablet Take 1 tablet (12.5 mg total) by mouth at bedtime. 45 tablet 0   No current facility-administered medications for this visit.    Neurologic: Headache: No Seizure: No Paresthesias:No  Musculoskeletal: Strength & Muscle Tone: within normal limits Gait & Station: normal Patient leans: N/A  Psychiatric Specialty Exam: ROS  There were no vitals taken for this visit.There is no height or weight on file to calculate BMI.  General Appearance: Casual  Eye Contact:  Good  Speech:  Clear and Coherent  Volume:  Normal  Mood:  Dysphoric  Affect:  Appropriate  Thought Process:  Goal Directed  Orientation:  NA  Thought Content:  Logical  Suicidal Thoughts:  No  Homicidal Thoughts:  No  Memory:  NA  Judgement:  Good  Insight:  Good  Psychomotor Activity:  Normal  Concentration:     Recall:  New  of Knowledge:Fair  Language: Good  Akathisia:  No  Handed:  Right  AIMS (if indicated):    Assets:  Desire for Improvement  ADL's:  Intact  Cognition: WNL  Sleep:      Treatment Plan Summary: 10/15/202111:53 AM   This patient is doing very well.  She is diagnosed with major depression and takes Remeron 30 mg.  She has insomnia and she takes Ambien.  She is actually going to have a sleep study done to rule out sleep apnea.  Patient is functioning very well.  She will return to see me in 3 months.

## 2020-08-23 ENCOUNTER — Other Ambulatory Visit (HOSPITAL_COMMUNITY): Payer: Self-pay | Admitting: *Deleted

## 2020-08-23 MED ORDER — ALPRAZOLAM 2 MG PO TABS
2.0000 mg | ORAL_TABLET | Freq: Every day | ORAL | 0 refills | Status: DC
Start: 2020-08-23 — End: 2020-09-10

## 2020-08-23 NOTE — Progress Notes (Signed)
Prescription phoned in per Dr Karen Chafe approval.

## 2020-08-30 ENCOUNTER — Telehealth (HOSPITAL_COMMUNITY): Payer: Self-pay | Admitting: *Deleted

## 2020-08-30 NOTE — Telephone Encounter (Signed)
PA for Zolpidem 12.5mg  ER tablet has been approved by [place] Falling, [place] from 08/03/20 thru 08/29/21. No PA # in fax.

## 2020-08-31 ENCOUNTER — Telehealth: Payer: Self-pay

## 2020-08-31 NOTE — Telephone Encounter (Signed)
Spoke to patient explained metoprolol cannot be the reason she is gaining weight but patient doesn't want to be on medication anymore would like to try something else please advise

## 2020-09-01 NOTE — Telephone Encounter (Signed)
Lauren Chung, recommendations were provided to you regarding this on 08/31/2020 were you able to call her back.

## 2020-09-03 ENCOUNTER — Other Ambulatory Visit: Payer: Self-pay | Admitting: Cardiology

## 2020-09-03 DIAGNOSIS — R002 Palpitations: Secondary | ICD-10-CM

## 2020-09-03 MED ORDER — DILTIAZEM HCL ER COATED BEADS 120 MG PO CP24: 120.0000 mg | ORAL_CAPSULE | Freq: Every day | ORAL | 0 refills | Status: DC

## 2020-09-03 NOTE — Telephone Encounter (Signed)
Spoke to patient she is aware

## 2020-09-03 NOTE — Telephone Encounter (Signed)
Yes I spoke to patient she still doesn't want to be on metoprolol at all and would like to try something else

## 2020-09-03 NOTE — Telephone Encounter (Signed)
Discontinue Toprol-XL.  Sent in a prescription for Cardizem 120 mg p.o. daily.

## 2020-09-04 DIAGNOSIS — M329 Systemic lupus erythematosus, unspecified: Secondary | ICD-10-CM | POA: Diagnosis not present

## 2020-09-04 DIAGNOSIS — Z23 Encounter for immunization: Secondary | ICD-10-CM | POA: Diagnosis not present

## 2020-09-04 DIAGNOSIS — N1832 Chronic kidney disease, stage 3b: Secondary | ICD-10-CM | POA: Diagnosis not present

## 2020-09-04 DIAGNOSIS — E876 Hypokalemia: Secondary | ICD-10-CM | POA: Diagnosis not present

## 2020-09-06 DIAGNOSIS — I441 Atrioventricular block, second degree: Secondary | ICD-10-CM | POA: Diagnosis not present

## 2020-09-07 DIAGNOSIS — D649 Anemia, unspecified: Secondary | ICD-10-CM | POA: Diagnosis not present

## 2020-09-07 DIAGNOSIS — E7800 Pure hypercholesterolemia, unspecified: Secondary | ICD-10-CM | POA: Diagnosis not present

## 2020-09-07 DIAGNOSIS — Z86711 Personal history of pulmonary embolism: Secondary | ICD-10-CM | POA: Diagnosis not present

## 2020-09-07 DIAGNOSIS — G43909 Migraine, unspecified, not intractable, without status migrainosus: Secondary | ICD-10-CM | POA: Diagnosis not present

## 2020-09-07 DIAGNOSIS — I10 Essential (primary) hypertension: Secondary | ICD-10-CM | POA: Diagnosis not present

## 2020-09-07 DIAGNOSIS — N1832 Chronic kidney disease, stage 3b: Secondary | ICD-10-CM | POA: Diagnosis not present

## 2020-09-07 DIAGNOSIS — Z8673 Personal history of transient ischemic attack (TIA), and cerebral infarction without residual deficits: Secondary | ICD-10-CM | POA: Diagnosis not present

## 2020-09-07 DIAGNOSIS — Z7901 Long term (current) use of anticoagulants: Secondary | ICD-10-CM | POA: Diagnosis not present

## 2020-09-07 DIAGNOSIS — M329 Systemic lupus erythematosus, unspecified: Secondary | ICD-10-CM | POA: Diagnosis not present

## 2020-09-07 DIAGNOSIS — K5900 Constipation, unspecified: Secondary | ICD-10-CM | POA: Diagnosis not present

## 2020-09-07 DIAGNOSIS — M797 Fibromyalgia: Secondary | ICD-10-CM | POA: Diagnosis not present

## 2020-09-07 DIAGNOSIS — G4700 Insomnia, unspecified: Secondary | ICD-10-CM | POA: Diagnosis not present

## 2020-09-10 ENCOUNTER — Other Ambulatory Visit (HOSPITAL_COMMUNITY): Payer: Self-pay | Admitting: *Deleted

## 2020-09-10 MED ORDER — ALPRAZOLAM 2 MG PO TABS: 2.0000 mg | ORAL_TABLET | Freq: Every day | ORAL | 0 refills | Status: DC

## 2020-09-10 MED ORDER — ZOLPIDEM TARTRATE ER 12.5 MG PO TBCR: 12.5000 mg | EXTENDED_RELEASE_TABLET | Freq: Every day | ORAL | 0 refills | Status: DC

## 2020-10-05 DIAGNOSIS — Z7901 Long term (current) use of anticoagulants: Secondary | ICD-10-CM | POA: Diagnosis not present

## 2020-10-05 DIAGNOSIS — Z86711 Personal history of pulmonary embolism: Secondary | ICD-10-CM | POA: Diagnosis not present

## 2020-10-09 DIAGNOSIS — M329 Systemic lupus erythematosus, unspecified: Secondary | ICD-10-CM | POA: Diagnosis not present

## 2020-10-09 DIAGNOSIS — E876 Hypokalemia: Secondary | ICD-10-CM | POA: Diagnosis not present

## 2020-10-09 DIAGNOSIS — N1832 Chronic kidney disease, stage 3b: Secondary | ICD-10-CM | POA: Diagnosis not present

## 2020-10-22 ENCOUNTER — Other Ambulatory Visit (HOSPITAL_COMMUNITY): Payer: Self-pay | Admitting: Psychiatry

## 2020-10-22 ENCOUNTER — Other Ambulatory Visit (HOSPITAL_COMMUNITY): Payer: Self-pay | Admitting: *Deleted

## 2020-10-22 MED ORDER — ZOLPIDEM TARTRATE ER 12.5 MG PO TBCR: 12.5000 mg | EXTENDED_RELEASE_TABLET | Freq: Every day | ORAL | 0 refills | Status: DC

## 2020-10-25 ENCOUNTER — Other Ambulatory Visit (HOSPITAL_COMMUNITY): Payer: Self-pay | Admitting: *Deleted

## 2020-10-25 MED ORDER — ALPRAZOLAM 2 MG PO TABS: 2.0000 mg | ORAL_TABLET | Freq: Every day | ORAL | 0 refills | Status: DC

## 2020-11-07 DIAGNOSIS — Z7901 Long term (current) use of anticoagulants: Secondary | ICD-10-CM | POA: Diagnosis not present

## 2020-11-07 DIAGNOSIS — Z86711 Personal history of pulmonary embolism: Secondary | ICD-10-CM | POA: Diagnosis not present

## 2020-11-08 DIAGNOSIS — Z23 Encounter for immunization: Secondary | ICD-10-CM | POA: Diagnosis not present

## 2020-11-21 ENCOUNTER — Telehealth (INDEPENDENT_AMBULATORY_CARE_PROVIDER_SITE_OTHER): Payer: Medicare Other | Admitting: Psychiatry

## 2020-11-21 ENCOUNTER — Other Ambulatory Visit: Payer: Self-pay

## 2020-11-21 DIAGNOSIS — F339 Major depressive disorder, recurrent, unspecified: Secondary | ICD-10-CM | POA: Diagnosis not present

## 2020-11-21 DIAGNOSIS — F329 Major depressive disorder, single episode, unspecified: Secondary | ICD-10-CM

## 2020-11-21 MED ORDER — ZOLPIDEM TARTRATE ER 12.5 MG PO TBCR: 12.5000 mg | EXTENDED_RELEASE_TABLET | Freq: Every day | ORAL | 0 refills | Status: DC

## 2020-11-21 MED ORDER — ZOLPIDEM TARTRATE ER 12.5 MG PO TBCR: 12.5000 mg | EXTENDED_RELEASE_TABLET | Freq: Every day | ORAL | 4 refills | Status: DC

## 2020-11-21 MED ORDER — BUPROPION HCL ER (XL) 150 MG PO TB24: ORAL_TABLET | ORAL | 4 refills | Status: DC

## 2020-11-21 MED ORDER — BUPROPION HCL ER (XL) 150 MG PO TB24: 150.0000 mg | ORAL_TABLET | ORAL | 2 refills | Status: DC

## 2020-11-21 NOTE — Progress Notes (Signed)
Psychiatric Initial Adult Assessment   Patient Identification: Lauren Chung MRN:  [id number] Date of Evaluation:  11/21/2020 Referral Source [facility] Chief Complaint:    Visit Diagnosis: Major depression  Today the patient is not doing well.  She says she feels depressed and she started to isolate herself again.  Her depression is reducing her energy and she does seem to start avoiding getting out of her bed.  She still has a day care of her autistic son's 34 year old and their dog.  I think important in the stories the patient in the last 4 months has gained about 30 pounds.  This puts her weight gain of about 50 pounds in the last year or so.  I fear this might be related to the Remeron that she is taking.  The patient is sleeping well.  She is not psychotic.  She drinks no alcohol and uses no drugs.  She did volunteer a few times and seemed to like it but cannot make herself get out of the bed.  She stays in bed and watches TV gets on the computer.  She does not read.  The patient is on a beta-blocker as well which might be affecting her energy.  The patient is certainly not suicidal.  She is not comfortable the way she is.  This is clearly a decline since her last visit.  She is actually showing increasing anhedonia.  Anxiety Symptoms:   Psychotic Symptoms:   PTSD Symptoms:   Past Psychiatric History: Past therapy, one psychiatric hospitalization  Previous Psychotropic Medications: Yes   Substance Abuse History in the last 12 months:  No.  Consequences of Substance Abuse: Negative  Past Medical History:  Past Medical History:  Diagnosis Date  . Anemia   . Anxiety   . Depression   . History of blood transfusion    in [place]  . Hyperlipidemia   . Hypertension   . Lupus ([place])   . PE (pulmonary embolism)   . Pneumonia    x 3 - last time 2017  . PTSD (post-traumatic stress disorder)   . Stroke [facility])    many years ago per patient, no deficits  . SVD (spontaneous vaginal  delivery)    x 3  . TIA (transient ischemic attack)    many years ago per patient, no deficits    Past Surgical History:  Procedure Laterality Date  . ABDOMINAL SURGERY    . ABLATION     gyn procedure for bleeding  . cyst removed     right leg with sedation/anesthesia  . EXCISION MASS HEAD N/A 08/05/2019   Procedure: EXCISION OF SCALP CYST;  Surgeon: Izora Gala, MD;  Location: [place];  Service: ENT;  Laterality: N/A;    Family Psychiatric History:   Family History:  Family History  Problem Relation Age of Onset  . Leukemia Father   . Seizures Daughter   . Drug abuse Mother   . Alcohol abuse Mother     Social History:   Social History   Socioeconomic History  . Marital status: Single    Spouse name: Not on file  . Number of children: 3  . Years of education: Not on file  . Highest education level: Not on file  Occupational History  . Not on file  Tobacco Use  . Smoking status: Never Smoker  . Smokeless tobacco: Never Used  Vaping Use  . Vaping Use: Never used  Substance and Sexual Activity  . Alcohol use: No  .  Drug use: No  . Sexual activity: Yes    Partners: Male    Birth control/protection: Condom, Post-menopausal  Other Topics Concern  . Not on file  Social History Narrative  . Not on file   Social Determinants of Health   Financial Resource Strain: Not on file  Food Insecurity: Not on file  Transportation Needs: Not on file  Physical Activity: Not on file  Stress: Not on file  Social Connections: Not on file    Additional Social History:   Allergies:   Allergies  Allergen Reactions  . Codeine Shortness Of Breath  . Contrast Media [Iodinated Diagnostic Agents] Shortness Of Breath  . Tylenol [Acetaminophen] Nausea And Vomiting    Metabolic Disorder Labs: No results found for: HGBA1C, MPG No results found for: PROLACTIN No results found for: CHOL, TRIG, HDL, CHOLHDL, VLDL, LDLCALC   Current Medications: Current  Outpatient Medications  Medication Sig Dispense Refill  . alprazolam (XANAX) 2 MG tablet Take 1 tablet (2 mg total) by mouth at bedtime. 30 tablet 0  . diltiazem (CARDIZEM CD) 120 MG 24 hr capsule Take 1 capsule (120 mg total) by mouth daily. 30 capsule 0  . nortriptyline (PAMELOR) 25 MG capsule Take 1 capsule (25 mg total) by mouth at bedtime. 90 capsule 2  . potassium chloride (K-DUR) 10 MEQ tablet Take 20 mEq by mouth once.     . rosuvastatin (CRESTOR) 10 MG tablet Take 10 mg by mouth at bedtime.     . triamterene-hydrochlorothiazide (MAXZIDE-25) 37.5-25 MG tablet Take 1 tablet by mouth daily.    Marland Kitchen warfarin (COUMADIN) 2 MG tablet Take 3 mg by mouth daily.     Marland Kitchen zolpidem (AMBIEN CR) 12.5 MG CR tablet Take 1 tablet (12.5 mg total) by mouth at bedtime. 30 tablet 0   No current facility-administered medications for this visit.    Neurologic: Headache: No Seizure: No Paresthesias:No  Musculoskeletal: Strength & Muscle Tone: within normal limits Gait & Station: normal Patient leans: N/A  Psychiatric Specialty Exam: ROS  There were no vitals taken for this visit.There is no height or weight on file to calculate BMI.  General Appearance: Casual  Eye Contact:  Good  Speech:  Clear and Coherent  Volume:  Normal  Mood:  Dysphoric  Affect:  Appropriate  Thought Process:  Goal Directed  Orientation:  NA  Thought Content:  Logical  Suicidal Thoughts:  No  Homicidal Thoughts:  No  Memory:  NA  Judgement:  Good  Insight:  Good  Psychomotor Activity:  Normal  Concentration:    Recall:  [place] of Knowledge:Fair  Language: Good  Akathisia:  No  Handed:  Right  AIMS (if indicated):    Assets:  Desire for Improvement  ADL's:  Intact  Cognition: WNL  Sleep:      Treatment Plan Summary: 1/19/20224:02 PM   This patient's first problem is major depression.  At this time we will discontinue Remeron and begin her on Wellbutrin.  It is noted that she has never had a seizure.  Her  second problem is that of insomnia she will continue taking Ambien as prescribed.

## 2020-11-26 ENCOUNTER — Other Ambulatory Visit (HOSPITAL_COMMUNITY): Payer: Self-pay | Admitting: *Deleted

## 2020-11-26 MED ORDER — BUPROPION HCL ER (XL) 300 MG PO TB24: ORAL_TABLET | ORAL | 4 refills | Status: DC

## 2020-12-20 DIAGNOSIS — Z86711 Personal history of pulmonary embolism: Secondary | ICD-10-CM | POA: Diagnosis not present

## 2020-12-20 DIAGNOSIS — Z7901 Long term (current) use of anticoagulants: Secondary | ICD-10-CM | POA: Diagnosis not present

## 2020-12-21 ENCOUNTER — Other Ambulatory Visit (HOSPITAL_COMMUNITY): Payer: Self-pay | Admitting: Psychiatry

## 2020-12-22 ENCOUNTER — Other Ambulatory Visit (HOSPITAL_COMMUNITY): Payer: Self-pay | Admitting: Psychiatry

## 2020-12-26 ENCOUNTER — Other Ambulatory Visit (HOSPITAL_COMMUNITY): Payer: Self-pay | Admitting: *Deleted

## 2020-12-26 MED ORDER — ALPRAZOLAM 2 MG PO TABS: 2.0000 mg | ORAL_TABLET | Freq: Every day | ORAL | 0 refills | Status: DC

## 2020-12-27 DIAGNOSIS — Z86711 Personal history of pulmonary embolism: Secondary | ICD-10-CM | POA: Diagnosis not present

## 2020-12-27 DIAGNOSIS — Z7901 Long term (current) use of anticoagulants: Secondary | ICD-10-CM | POA: Diagnosis not present

## 2021-01-10 DIAGNOSIS — Z7901 Long term (current) use of anticoagulants: Secondary | ICD-10-CM | POA: Diagnosis not present

## 2021-01-10 DIAGNOSIS — Z86711 Personal history of pulmonary embolism: Secondary | ICD-10-CM | POA: Diagnosis not present

## 2021-01-22 ENCOUNTER — Other Ambulatory Visit: Payer: Self-pay

## 2021-01-22 ENCOUNTER — Telehealth (INDEPENDENT_AMBULATORY_CARE_PROVIDER_SITE_OTHER): Payer: Medicare Other | Admitting: Psychiatry

## 2021-01-22 DIAGNOSIS — F339 Major depressive disorder, recurrent, unspecified: Secondary | ICD-10-CM | POA: Diagnosis not present

## 2021-01-22 MED ORDER — BUPROPION HCL ER (XL) 300 MG PO TB24: ORAL_TABLET | ORAL | 4 refills | Status: DC

## 2021-01-22 MED ORDER — ALPRAZOLAM 2 MG PO TABS: 2.0000 mg | ORAL_TABLET | Freq: Every day | ORAL | 3 refills | Status: DC

## 2021-01-22 MED ORDER — ALPRAZOLAM 2 MG PO TABS: 2.0000 mg | ORAL_TABLET | Freq: Every day | ORAL | 0 refills | Status: DC

## 2021-01-22 MED ORDER — ZOLPIDEM TARTRATE ER 12.5 MG PO TBCR: 12.5000 mg | EXTENDED_RELEASE_TABLET | Freq: Every day | ORAL | 4 refills | Status: DC

## 2021-01-22 NOTE — Progress Notes (Signed)
Psychiatric Initial Adult Assessment   Patient Identification: Lauren Chung MRN:  [id number] Date of Evaluation:  01/22/2021 Referral Source [facility] Chief Complaint:    Visit Diagnosis: Major depression  Today the patient is doing better.  Her mood is distinctly improved.  Since being off the Remeron on the Wellbutrin she has lost 12 pounds.  She feels much better about herself.  She has more energy.  She says she is getting out of her room out of the bed much more.  She is yet to really get out of her apartment.  She feels embarrassed walk around her community.  She has issues about her 3 dogs.  The 1 dog who actually can be brought in public is ironically her pitbull.  She knows the people are kind of concerned and worried that and she also thinks of some racial issues and that she is looking in all white community.  Now the patient is sleeping much better.  Her energy level is good.  She is enjoying more things.  Her son is doing well.  The patient attempted to contact the therapist I gave her but they were not taking new patients.  The patient drinks no alcohol uses no drugs.  She has no psychotic symptomatology.  Her anxiety is well controlled.  Anxiety Symptoms:   Psychotic Symptoms:   PTSD Symptoms:   Past Psychiatric History: Past therapy, one psychiatric hospitalization  Previous Psychotropic Medications: Yes   Substance Abuse History in the last 12 months:  No.  Consequences of Substance Abuse: Negative  Past Medical History:  Past Medical History:  Diagnosis Date  . Anemia   . Anxiety   . Depression   . History of blood transfusion    in [place]  . Hyperlipidemia   . Hypertension   . Lupus ([place])   . PE (pulmonary embolism)   . Pneumonia    x 3 - last time 2017  . PTSD (post-traumatic stress disorder)   . Stroke [facility])    many years ago per patient, no deficits  . SVD (spontaneous vaginal delivery)    x 3  . TIA (transient ischemic attack)    many years ago  per patient, no deficits    Past Surgical History:  Procedure Laterality Date  . ABDOMINAL SURGERY    . ABLATION     gyn procedure for bleeding  . cyst removed     right leg with sedation/anesthesia  . EXCISION MASS HEAD N/A 08/05/2019   Procedure: EXCISION OF SCALP CYST;  Surgeon: Izora Gala, MD;  Location: [place];  Service: ENT;  Laterality: N/A;    Family Psychiatric History:   Family History:  Family History  Problem Relation Age of Onset  . Leukemia Father   . Seizures Daughter   . Drug abuse Mother   . Alcohol abuse Mother     Social History:   Social History   Socioeconomic History  . Marital status: Single    Spouse name: Not on file  . Number of children: 3  . Years of education: Not on file  . Highest education level: Not on file  Occupational History  . Not on file  Tobacco Use  . Smoking status: Never Smoker  . Smokeless tobacco: Never Used  Vaping Use  . Vaping Use: Never used  Substance and Sexual Activity  . Alcohol use: No  . Drug use: No  . Sexual activity: Yes    Partners: Male    Birth  control/protection: Condom, Post-menopausal  Other Topics Concern  . Not on file  Social History Narrative  . Not on file   Social Determinants of Health   Financial Resource Strain: Not on file  Food Insecurity: Not on file  Transportation Needs: Not on file  Physical Activity: Not on file  Stress: Not on file  Social Connections: Not on file    Additional Social History:   Allergies:   Allergies  Allergen Reactions  . Codeine Shortness Of Breath  . Contrast Media [Iodinated Diagnostic Agents] Shortness Of Breath  . Tylenol [Acetaminophen] Nausea And Vomiting    Metabolic Disorder Labs: No results found for: HGBA1C, MPG No results found for: PROLACTIN No results found for: CHOL, TRIG, HDL, CHOLHDL, VLDL, LDLCALC   Current Medications: Current Outpatient Medications  Medication Sig Dispense Refill  . alprazolam  (XANAX) 2 MG tablet Take 1 tablet (2 mg total) by mouth at bedtime. 30 tablet 3  . buPROPion (WELLBUTRIN XL) 300 MG 24 hr tablet Take one tablet (300mg ) by mouth daily. 30 tablet 4  . diltiazem (CARDIZEM CD) 120 MG 24 hr capsule Take 1 capsule (120 mg total) by mouth daily. 30 capsule 0  . potassium chloride (K-DUR) 10 MEQ tablet Take 20 mEq by mouth once.     . rosuvastatin (CRESTOR) 10 MG tablet Take 10 mg by mouth at bedtime.     . triamterene-hydrochlorothiazide (MAXZIDE-25) 37.5-25 MG tablet Take 1 tablet by mouth daily.    Marland Kitchen warfarin (COUMADIN) 2 MG tablet Take 3 mg by mouth daily.     Marland Kitchen zolpidem (AMBIEN CR) 12.5 MG CR tablet Take 1 tablet (12.5 mg total) by mouth at bedtime. 30 tablet 4   No current facility-administered medications for this visit.    Neurologic: Headache: No Seizure: No Paresthesias:No  Musculoskeletal: Strength & Muscle Tone: within normal limits Gait & Station: normal Patient leans: N/A  Psychiatric Specialty Exam: ROS  There were no vitals taken for this visit.There is no height or weight on file to calculate BMI.  General Appearance: Casual  Eye Contact:  Good  Speech:  Clear and Coherent  Volume:  Normal  Mood:  Dysphoric  Affect:  Appropriate  Thought Process:  Goal Directed  Orientation:  NA  Thought Content:  Logical  Suicidal Thoughts:  No  Homicidal Thoughts:  No  Memory:  NA  Judgement:  Good  Insight:  Good  Psychomotor Activity:  Normal  Concentration:    Recall:  [place] of Knowledge:Fair  Language: Good  Akathisia:  No  Handed:  Right  AIMS (if indicated):    Assets:  Desire for Improvement  ADL's:  Intact  Cognition: WNL  Sleep:      Treatment Plan Summary: 3/22/20222:49 PM    Her first problem is that of major clinical depression.  At this time she is doing better on Wellbutrin.  She has more energy and she is not gaining weight in fact she is losing weight.  Today we will give her the new name of Dr. Beola Cord for a therapist.  Her second problem is that of insomnia.  The patient takes long-acting Ambien together with some Xanax.  These 2 medicines together help her anxiety as well as her sleep issues.  She is sleeping well much calmer and much happier about life.  She will return to see me in about 3 months.  She is made a commitment to call the therapist.  She will try to get up  and get out of her apartment more so.

## 2021-01-24 DIAGNOSIS — Z86711 Personal history of pulmonary embolism: Secondary | ICD-10-CM | POA: Diagnosis not present

## 2021-01-24 DIAGNOSIS — Z7901 Long term (current) use of anticoagulants: Secondary | ICD-10-CM | POA: Diagnosis not present

## 2021-02-07 DIAGNOSIS — Z86711 Personal history of pulmonary embolism: Secondary | ICD-10-CM | POA: Diagnosis not present

## 2021-02-07 DIAGNOSIS — Z7901 Long term (current) use of anticoagulants: Secondary | ICD-10-CM | POA: Diagnosis not present

## 2021-02-12 ENCOUNTER — Ambulatory Visit: Payer: Medicare Other | Admitting: Cardiology

## 2021-02-21 ENCOUNTER — Ambulatory Visit: Payer: Medicare Other | Admitting: Cardiology

## 2021-02-21 ENCOUNTER — Encounter: Payer: Self-pay | Admitting: Cardiology

## 2021-02-21 ENCOUNTER — Other Ambulatory Visit: Payer: Self-pay

## 2021-02-21 VITALS — BP 110/71 | HR 79 | Temp 97.7°F | Resp 16 | Ht 68.0 in | Wt 186.0 lb

## 2021-02-21 DIAGNOSIS — R0600 Dyspnea, unspecified: Secondary | ICD-10-CM

## 2021-02-21 DIAGNOSIS — Z8673 Personal history of transient ischemic attack (TIA), and cerebral infarction without residual deficits: Secondary | ICD-10-CM | POA: Diagnosis not present

## 2021-02-21 DIAGNOSIS — Z7901 Long term (current) use of anticoagulants: Secondary | ICD-10-CM | POA: Diagnosis not present

## 2021-02-21 DIAGNOSIS — I129 Hypertensive chronic kidney disease with stage 1 through stage 4 chronic kidney disease, or unspecified chronic kidney disease: Secondary | ICD-10-CM

## 2021-02-21 DIAGNOSIS — Z86711 Personal history of pulmonary embolism: Secondary | ICD-10-CM | POA: Diagnosis not present

## 2021-02-21 DIAGNOSIS — M329 Systemic lupus erythematosus, unspecified: Secondary | ICD-10-CM | POA: Diagnosis not present

## 2021-02-21 DIAGNOSIS — IMO0002 Reserved for concepts with insufficient information to code with codable children: Secondary | ICD-10-CM

## 2021-02-21 DIAGNOSIS — R0609 Other forms of dyspnea: Secondary | ICD-10-CM

## 2021-02-21 DIAGNOSIS — R002 Palpitations: Secondary | ICD-10-CM

## 2021-02-21 DIAGNOSIS — N183 Chronic kidney disease, stage 3 unspecified: Secondary | ICD-10-CM | POA: Diagnosis not present

## 2021-02-21 NOTE — Progress Notes (Signed)
ID:  Lauren Chung, DOB [birth year], MRN [id number]  PCP:  Antony Contras, MD  Cardiologist:  Rex Kras, DO, [facility] (established care 06/13/2020)  Date: 02/21/21 Last Office Visit: 08/14/2020  Chief Complaint  Patient presents with  . Palpitations  . Follow-up    HPI  Lauren Chung is a 55 y.o. female who presents to the office with a chief complaint of " 77-monthfollow-up for palpitations." Patient's past medical history and cardiovascular risk factors include: Hx of pulmonary embolism, hx of CVA, Lupus, HLD, HTN, CKD, iron deficiency anemia.   Patient is referred to the office at the request of her primary care provider for evaluation and management of palpitations.  For the last 6 months patient states that her symptoms of palpitations are quite stable.  She initially thought the metoprolol was causing her to gain weight and therefore was requesting medication changes.  She was transitioned to diltiazem but symptoms were not well controlled on calcium channel blockers and therefore she transitioned back to metoprolol.  In the past she had symptoms of effort related dyspnea and given her multiple cardiovascular risk factors underwent an echocardiogram and stress test.  Echocardiogram noted preserved LVEF without any significant valvular heart disease and stress test was overall low risk study.  FUNCTIONAL STATUS: No structured exercise program or daily routine.   ALLERGIES: Allergies  Allergen Reactions  . Codeine Shortness Of Breath  . Contrast Media [Iodinated Diagnostic Agents] Shortness Of Breath  . Tylenol [Acetaminophen] Nausea And Vomiting    MEDICATION LIST PRIOR TO VISIT: Current Meds  Medication Sig  . alprazolam (XANAX) 2 MG tablet Take 1 tablet (2 mg total) by mouth at bedtime. (Patient taking differently: Take 1 mg by mouth at bedtime.)  . buPROPion (WELLBUTRIN XL) 300 MG 24 hr tablet Take one tablet (3061m by mouth daily.  . metoprolol succinate (TOPROL-XL)  25 MG 24 hr tablet Take 1 tablet by mouth daily.  . rosuvastatin (CRESTOR) 10 MG tablet Take 10 mg by mouth at bedtime.   . Marland Kitchenarfarin (COUMADIN) 2 MG tablet Take 3 mg by mouth daily.   . Marland Kitchenolpidem (AMBIEN CR) 12.5 MG CR tablet Take 1 tablet (12.5 mg total) by mouth at bedtime.     PAST MEDICAL HISTORY: Past Medical History:  Diagnosis Date  . Anemia   . Anxiety   . Depression   . [place]   . Hypertension   . [place]  . PE (pulmonary embolism)   . Pneumonia    x 3 - last time 2017  . PTSD (post-traumatic stress disorder)   . [facility]   many years ago per patient, no deficits  . SVD (spontaneous vaginal delivery)    x 3  . TIA (transient ischemic attack)    many years ago per patient, no deficits    PAST SURGICAL HISTORY: Past Surgical History:  Procedure Laterality Date  . ABDOMINAL SURGERY    . ABLATION     gyn procedure for bleeding  . cyst removed     right leg with sedation/anesthesia  . EXCISION MASS HEAD N/A 08/05/2019   Procedure: EXCISION OF SCALP CYST;  Surgeon: RoIzora GalaMD;  [place];  Laterality: N/A;    FAMILY HISTORY: The patient family history includes Alcohol abuse in her mother; Drug abuse in her mother; Leukemia in her father; Seizures in her daughter.  SOCIAL HISTORY:  The  patient  reports that she has never smoked. She has never used smokeless tobacco. She reports that she does not drink alcohol and does not use drugs.  REVIEW OF SYSTEMS: Review of Systems  Constitutional: Negative for chills and fever.  HENT: Negative for hoarse voice and nosebleeds.   Eyes: Negative for discharge, double vision and pain.  Cardiovascular: Negative for chest pain, claudication, dyspnea on exertion, leg swelling, near-syncope, orthopnea, palpitations, paroxysmal nocturnal dyspnea and syncope.  Respiratory: Negative for hemoptysis and shortness of breath.    Musculoskeletal: Negative for muscle cramps and myalgias.  Gastrointestinal: Negative for abdominal pain, constipation, diarrhea, hematemesis, hematochezia, melena, nausea and vomiting.  Neurological: Negative for dizziness and light-headedness.    PHYSICAL EXAM: Vitals with BMI 02/21/2021 08/14/2020 07/11/2020  Height 5' 8"  5' 8"  5' 8"   Weight 186 lbs 190 lbs 182 lbs  BMI 28.29 76.2 83.15  Systolic 176 160 737  Diastolic 71 76 73  Pulse 79 83 100  Some encounter information is confidential and restricted. Go to Review Flowsheets activity to see all data.   CONSTITUTIONAL: Well-developed and well-nourished. No acute distress.  SKIN: Skin is warm and dry. No rash noted. No cyanosis. No pallor. No jaundice HEAD: Normocephalic and atraumatic.  EYES: No scleral icterus MOUTH/THROAT: Moist oral membranes.  NECK: No JVD present. No thyromegaly noted. No carotid bruits  LYMPHATIC: No visible cervical adenopathy.  CHEST Normal respiratory effort. No intercostal retractions  LUNGS: Clear to auscultation bilaterally.  No stridor. No wheezes. No rales.  CARDIOVASCULAR: Regular, tachycardic, positive S1-S2, no murmurs rubs or gallops appreciated. ABDOMINAL: Nonobese, soft, nontender, nondistended, positive bowel sounds in all 4 quadrants, no apparent ascites.  EXTREMITIES: No peripheral edema  HEMATOLOGIC: No significant bruising NEUROLOGIC: Oriented to person, place, and time. Nonfocal. Normal muscle tone.  PSYCHIATRIC: Normal mood and affect. Normal behavior. Cooperative  CARDIAC DATABASE: EKG: 02/21/2021: Normal sinus rhythm, 77 bpm, normal axis, without underlying ischemia or injury pattern.  Echocardiogram: 06/27/2020: Left ventricle cavity is normal in size. Moderate concentric hypertrophy of the left ventricle. Normal global wall motion. Normal LV systolic function with EF 56%. Doppler evidence of grade I (impaired) diastolic dysfunction, normal LAP.  No significant valvular  abnormality. IVC is normal with blunted respiratory response. Estimated RA pressure 8 mmHg.  Stress Testing: Exercise tetrofosmin stress test 06/25/2020: Normal ECG stress. The patient exercised for 5 minutes and 29 seconds of a Bruce protocol, achieving approximately 6.64 METs. Exercise capacity reduced for age. Normal BP response.  Mild degree medium extent perfusion defect consistent with soft tissue attenuation in the apical inferior wall, mid inferior wall and basal inferior wall of left ventricle.  Overall LV systolic function is normal without regional wall motion abnormalities. Stress LV EF: 63%.  No previous exam available for comparison. Low risk study.   Heart Catheterization: None  7 day extended Holter monitor:  Dominant rhythm normal sinus.  Heart rate 66-154 bpm. Average HR 96 bpm.  No atrial fibrillation/nonsustained ventricular tachycardia/high grade AV block, sinus pause greater than or equal to 3 seconds in duration.  Total ventricular ectopic burden <1%.  Total supraventricular ectopic burden <1%.  Number of patient triggered events: 9. Predominantly underlying rhythm sinus tachycardia (7 episodes) and 2 episodes of normal sinus rhythm with rare VE.  Auto triggered event on 06/17/2020 at 7:49am patient had sinus rhythm with high degree AV block.  Auto triggered event on 06/15/2020 at 3:27am patient had sinus rhythm with second degree type I AV block.   LABORATORY DATA:  External Labs: Collected: 05/21/2020 Creatinine 1.45 mg/dL. eGFR: 38 mL/min per 1.73 m Potassium 3.2, sodium 132, chloride 94, bicarb 25 Lipid profile: Total cholesterol 123, triglycerides 102, HDL 39, LDL 65, non-HDL 84 Hemoglobin: 10.6 g/dL Platelets 459K TSH: 2.31   IMPRESSION:    ICD-10-CM   1. Palpitations  R00.2 EKG 12-Lead  2. Dyspnea on exertion  R06.00   3. Benign hypertension with CKD (chronic kidney disease) stage III (HCC)  I12.9    N18.30   4. Lupus ([place])  M32.9   5. History  of CVA (cerebrovascular accident)  Z86.73   6. History of pulmonary embolism  Z86.711   7. Long term (current) use of anticoagulants  Z79.01      RECOMMENDATIONS: Claramae Rigdon is a 55 y.o. female whose past medical history and cardiac risk factors include: Hx of pulmonary embolism, hx of CVA, Lupus, HLD, HTN, CKD, iron deficiency anemia.   Palpitations:  Chronic and stable.  No acute exacerbation of her symptoms.  No known reversible causes.  Tolerating Toprol-XL 25 mg p.o. daily well without any side effects or intolerances.    No additional cardiovascular testing recommended at this time.    Dyspnea on exertion:  Stable  Recent ischemic evaluation which is overall unremarkable.    We will continue to monitor and defer noncardiac management to PCP.  Hypertension with chronic kidney disease stage III: Office blood pressure is well controlled and currently at goal.  Currently managed by PCP.  Medications reconciled.  Mixed hyperlipidemia: Currently on rosuvastatin.  Currently managed by PCP.  FINAL MEDICATION LIST END OF ENCOUNTER: No orders of the defined types were placed in this encounter.   Current Outpatient Medications:  .  alprazolam (XANAX) 2 MG tablet, Take 1 tablet (2 mg total) by mouth at bedtime. (Patient taking differently: Take 1 mg by mouth at bedtime.), Disp: 30 tablet, Rfl: 3 .  buPROPion (WELLBUTRIN XL) 300 MG 24 hr tablet, Take one tablet (341m) by mouth daily., Disp: 30 tablet, Rfl: 4 .  metoprolol succinate (TOPROL-XL) 25 MG 24 hr tablet, Take 1 tablet by mouth daily., Disp: , Rfl:  .  rosuvastatin (CRESTOR) 10 MG tablet, Take 10 mg by mouth at bedtime. , Disp: , Rfl:  .  warfarin (COUMADIN) 2 MG tablet, Take 3 mg by mouth daily. , Disp: , Rfl:  .  zolpidem (AMBIEN CR) 12.5 MG CR tablet, Take 1 tablet (12.5 mg total) by mouth at bedtime., Disp: 30 tablet, Rfl: 4  Orders Placed This Encounter  Procedures  . EKG 12-Lead    There are no Patient  Instructions on file for this visit.   --Continue cardiac medications as reconciled in final medication list. --Return in about 1 year (around 02/21/2022) for Follow up, Palpitations. Or sooner if needed. --Continue follow-up with your primary care physician regarding the management of your other chronic comorbid conditions.  Patient's questions and concerns were addressed to her satisfaction. She voices understanding of the instructions provided during this encounter.   This note was created using a voice recognition software as a result there may be grammatical errors inadvertently enclosed that do not reflect the nature of this encounter. Every attempt is made to correct such errors.  SRex Kras DNevada FGall[facility] LLC Pager: [phone]: [phone]

## 2021-03-07 DIAGNOSIS — Z7901 Long term (current) use of anticoagulants: Secondary | ICD-10-CM | POA: Diagnosis not present

## 2021-03-07 DIAGNOSIS — Z86711 Personal history of pulmonary embolism: Secondary | ICD-10-CM | POA: Diagnosis not present

## 2021-04-08 DIAGNOSIS — Z86711 Personal history of pulmonary embolism: Secondary | ICD-10-CM | POA: Diagnosis not present

## 2021-04-08 DIAGNOSIS — Z7901 Long term (current) use of anticoagulants: Secondary | ICD-10-CM | POA: Diagnosis not present

## 2021-04-17 ENCOUNTER — Telehealth (INDEPENDENT_AMBULATORY_CARE_PROVIDER_SITE_OTHER): Payer: Medicare Other | Admitting: Psychiatry

## 2021-04-17 ENCOUNTER — Other Ambulatory Visit: Payer: Self-pay

## 2021-04-17 DIAGNOSIS — F325 Major depressive disorder, single episode, in full remission: Secondary | ICD-10-CM

## 2021-04-17 MED ORDER — BUPROPION HCL ER (XL) 300 MG PO TB24: ORAL_TABLET | ORAL | 4 refills | Status: DC

## 2021-04-17 MED ORDER — ZOLPIDEM TARTRATE ER 12.5 MG PO TBCR: 12.5000 mg | EXTENDED_RELEASE_TABLET | Freq: Every day | ORAL | 4 refills | Status: DC

## 2021-04-17 MED ORDER — ALPRAZOLAM 2 MG PO TABS: 1.0000 mg | ORAL_TABLET | Freq: Every day | ORAL | 4 refills | Status: DC

## 2021-04-17 NOTE — Progress Notes (Signed)
Psychiatric Initial Adult Assessment   Patient Identification: Lauren Chung MRN:  [id number] Date of Evaluation:  04/17/2021 Referral Source [facility] ([facility] Chief Complaint:    Visit Diagnosis: Major depression Today the patient is having some cold symptoms.  She had a mild fever yesterday but that seems to be better.  She feels very fatigued.  I suggested the possibility of being tested for COVID.  The patient says she has been around no one.  She is isolated for well over a month or 2.  She says she never goes out.  She has a son who is impaired but never goes out as well.  Patient says despite being physically ill her mood is great.  She still has an appetite and she still sleeping but sleeping a bit too much.  She denies being anxious.  She denies use of alcohol or drugs.  She never ended up calling the therapist given to her.  She said it was just too expensive.  She apparently sees multiple physicians and has multiple copayments and is not available for her at this time.  Overall the patient's mood is much improved.  She denies being depressed.  She is not lonely.  She cares for her son and generally functions reasonably well.  She does now has a respiratory infection of some nature.  Anxiety Symptoms:   Psychotic Symptoms:   PTSD Symptoms:   Past Psychiatric History: Past therapy, one psychiatric hospitalization  Previous Psychotropic Medications: Yes   Substance Abuse History in the last 12 months:  No.  Consequences of Substance Abuse: Negative  Past Medical History:  Past Medical History:  Diagnosis Date   Anemia    Anxiety    Depression    History of blood transfusion    in [place]   Hyperlipidemia    Hypertension    Lupus ([place])    PE (pulmonary embolism)    Pneumonia    x 3 - last time 2017   PTSD (post-traumatic stress disorder)    Stroke ([place])    many years ago per patient, no deficits   SVD (spontaneous vaginal delivery)    x 3   TIA (transient ischemic attack)     many years ago per patient, no deficits    Past Surgical History:  Procedure Laterality Date   ABDOMINAL SURGERY     ABLATION     gyn procedure for bleeding   cyst removed     right leg with sedation/anesthesia   EXCISION MASS HEAD N/A 08/05/2019   Procedure: EXCISION OF SCALP CYST;  Surgeon: Izora Gala, MD;  Location: [facility];  Service: ENT;  Laterality: N/A;    Family Psychiatric History:   Family History:  Family History  Problem Relation Age of Onset   Leukemia Father    Seizures Daughter    Drug abuse Mother    Alcohol abuse Mother     Social History:   Social History   Socioeconomic History   Marital status: Single    Spouse name: Not on file   Number of children: 3   Years of education: Not on file   Highest education level: Not on file  Occupational History   Not on file  Tobacco Use   Smoking status: Never   Smokeless tobacco: Never  Vaping Use   Vaping Use: Never used  Substance and Sexual Activity   Alcohol use: No   Drug use: No   Sexual activity: Yes    Partners: Male  Birth control/protection: Condom, Post-menopausal  Other Topics Concern   Not on file  Social History Narrative   Not on file   Social Determinants of Health   Financial Resource Strain: Not on file  Food Insecurity: Not on file  Transportation Needs: Not on file  Physical Activity: Not on file  Stress: Not on file  Social Connections: Not on file    Additional Social History:   Allergies:   Allergies  Allergen Reactions   Codeine Shortness Of Breath   Contrast Media [Iodinated Diagnostic Agents] Shortness Of Breath   Tylenol [Acetaminophen] Nausea And Vomiting    Metabolic Disorder Labs: No results found for: HGBA1C, MPG No results found for: PROLACTIN No results found for: CHOL, TRIG, HDL, CHOLHDL, VLDL, LDLCALC   Current Medications: Current Outpatient Medications  Medication Sig Dispense Refill   alprazolam (XANAX) 2 MG tablet Take  0.5 tablets (1 mg total) by mouth at bedtime. 30 tablet 4   buPROPion (WELLBUTRIN XL) 300 MG 24 hr tablet Take one tablet (300mg ) by mouth daily. 30 tablet 4   metoprolol succinate (TOPROL-XL) 25 MG 24 hr tablet Take 1 tablet by mouth daily.     rosuvastatin (CRESTOR) 10 MG tablet Take 10 mg by mouth at bedtime.      warfarin (COUMADIN) 2 MG tablet Take 3 mg by mouth daily.      zolpidem (AMBIEN CR) 12.5 MG CR tablet Take 1 tablet (12.5 mg total) by mouth at bedtime. 30 tablet 4   No current facility-administered medications for this visit.    Neurologic: Headache: No Seizure: No Paresthesias:No  Musculoskeletal: Strength & Muscle Tone: within normal limits Gait & Station: normal Patient leans: N/A  Psychiatric Specialty Exam: ROS  There were no vitals taken for this visit.There is no height or weight on file to calculate BMI.  General Appearance: Casual  Eye Contact:  Good  Speech:  Clear and Coherent  Volume:  Normal  Mood:  Dysphoric  Affect:  Appropriate  Thought Process:  Goal Directed  Orientation:  NA  Thought Content:  Logical  Suicidal Thoughts:  No  Homicidal Thoughts:  No  Memory:  NA  Judgement:  Good  Insight:  Good  Psychomotor Activity:  Normal  Concentration:    Recall:  [place] of Knowledge:Fair  Language: Good  Akathisia:  No  Handed:  Right  AIMS (if indicated):    Assets:  Desire for Improvement  ADL's:  Intact  Cognition: WNL  Sleep:      Treatment Plan Summary: 6/15/20224:35 PM   This patient's diagnosis is that of major depression.  She will continue taking Wellbutrin as prescribed.  Her second problem is insomnia.  She will continue taking Ambien and Xanax for this condition.  At this time she is not in therapy.  She will be given a return appointment to see me in approximately 4 months.  I do believe that she is at her baseline emotionally.

## 2021-04-25 DIAGNOSIS — I10 Essential (primary) hypertension: Secondary | ICD-10-CM | POA: Diagnosis not present

## 2021-04-25 DIAGNOSIS — D649 Anemia, unspecified: Secondary | ICD-10-CM | POA: Diagnosis not present

## 2021-04-25 DIAGNOSIS — M329 Systemic lupus erythematosus, unspecified: Secondary | ICD-10-CM | POA: Diagnosis not present

## 2021-04-25 DIAGNOSIS — M797 Fibromyalgia: Secondary | ICD-10-CM | POA: Diagnosis not present

## 2021-04-25 DIAGNOSIS — K5900 Constipation, unspecified: Secondary | ICD-10-CM | POA: Diagnosis not present

## 2021-04-25 DIAGNOSIS — Z7901 Long term (current) use of anticoagulants: Secondary | ICD-10-CM | POA: Diagnosis not present

## 2021-04-25 DIAGNOSIS — Z86711 Personal history of pulmonary embolism: Secondary | ICD-10-CM | POA: Diagnosis not present

## 2021-04-25 DIAGNOSIS — E7800 Pure hypercholesterolemia, unspecified: Secondary | ICD-10-CM | POA: Diagnosis not present

## 2021-04-25 DIAGNOSIS — G4700 Insomnia, unspecified: Secondary | ICD-10-CM | POA: Diagnosis not present

## 2021-04-25 DIAGNOSIS — N1832 Chronic kidney disease, stage 3b: Secondary | ICD-10-CM | POA: Diagnosis not present

## 2021-04-25 DIAGNOSIS — K219 Gastro-esophageal reflux disease without esophagitis: Secondary | ICD-10-CM | POA: Diagnosis not present

## 2021-05-06 ENCOUNTER — Other Ambulatory Visit: Payer: Self-pay | Admitting: Cardiology

## 2021-05-06 DIAGNOSIS — R002 Palpitations: Secondary | ICD-10-CM

## 2021-05-07 DIAGNOSIS — Z86711 Personal history of pulmonary embolism: Secondary | ICD-10-CM | POA: Diagnosis not present

## 2021-05-07 DIAGNOSIS — Z7901 Long term (current) use of anticoagulants: Secondary | ICD-10-CM | POA: Diagnosis not present

## 2021-06-04 DIAGNOSIS — Z86711 Personal history of pulmonary embolism: Secondary | ICD-10-CM | POA: Diagnosis not present

## 2021-06-04 DIAGNOSIS — Z7901 Long term (current) use of anticoagulants: Secondary | ICD-10-CM | POA: Diagnosis not present

## 2021-06-25 ENCOUNTER — Other Ambulatory Visit (HOSPITAL_COMMUNITY): Payer: Self-pay | Admitting: Psychiatry

## 2021-06-25 MED ORDER — ALPRAZOLAM 2 MG PO TABS: 2.0000 mg | ORAL_TABLET | Freq: Every day | ORAL | 4 refills | Status: DC

## 2021-07-02 DIAGNOSIS — Z7901 Long term (current) use of anticoagulants: Secondary | ICD-10-CM | POA: Diagnosis not present

## 2021-07-02 DIAGNOSIS — I10 Essential (primary) hypertension: Secondary | ICD-10-CM | POA: Diagnosis not present

## 2021-07-02 DIAGNOSIS — M797 Fibromyalgia: Secondary | ICD-10-CM | POA: Diagnosis not present

## 2021-07-02 DIAGNOSIS — M791 Myalgia, unspecified site: Secondary | ICD-10-CM | POA: Diagnosis not present

## 2021-07-02 DIAGNOSIS — M329 Systemic lupus erythematosus, unspecified: Secondary | ICD-10-CM | POA: Diagnosis not present

## 2021-07-05 DIAGNOSIS — U071 COVID-19: Secondary | ICD-10-CM | POA: Diagnosis not present

## 2021-07-10 DIAGNOSIS — M79671 Pain in right foot: Secondary | ICD-10-CM | POA: Diagnosis not present

## 2021-07-10 DIAGNOSIS — M329 Systemic lupus erythematosus, unspecified: Secondary | ICD-10-CM | POA: Diagnosis not present

## 2021-07-10 DIAGNOSIS — M79641 Pain in right hand: Secondary | ICD-10-CM | POA: Diagnosis not present

## 2021-07-10 DIAGNOSIS — M797 Fibromyalgia: Secondary | ICD-10-CM | POA: Diagnosis not present

## 2021-07-10 DIAGNOSIS — M791 Myalgia, unspecified site: Secondary | ICD-10-CM | POA: Diagnosis not present

## 2021-07-10 DIAGNOSIS — Z86718 Personal history of other venous thrombosis and embolism: Secondary | ICD-10-CM | POA: Diagnosis not present

## 2021-07-10 DIAGNOSIS — M79642 Pain in left hand: Secondary | ICD-10-CM | POA: Diagnosis not present

## 2021-07-10 DIAGNOSIS — M2550 Pain in unspecified joint: Secondary | ICD-10-CM | POA: Diagnosis not present

## 2021-07-10 DIAGNOSIS — M79672 Pain in left foot: Secondary | ICD-10-CM | POA: Diagnosis not present

## 2021-07-10 DIAGNOSIS — M549 Dorsalgia, unspecified: Secondary | ICD-10-CM | POA: Diagnosis not present

## 2021-07-18 DIAGNOSIS — N1831 Chronic kidney disease, stage 3a: Secondary | ICD-10-CM | POA: Diagnosis not present

## 2021-07-18 DIAGNOSIS — M2550 Pain in unspecified joint: Secondary | ICD-10-CM | POA: Diagnosis not present

## 2021-07-18 DIAGNOSIS — M797 Fibromyalgia: Secondary | ICD-10-CM | POA: Diagnosis not present

## 2021-07-18 DIAGNOSIS — Z86718 Personal history of other venous thrombosis and embolism: Secondary | ICD-10-CM | POA: Diagnosis not present

## 2021-07-18 DIAGNOSIS — M1990 Unspecified osteoarthritis, unspecified site: Secondary | ICD-10-CM | POA: Diagnosis not present

## 2021-07-18 DIAGNOSIS — M549 Dorsalgia, unspecified: Secondary | ICD-10-CM | POA: Diagnosis not present

## 2021-07-18 DIAGNOSIS — M791 Myalgia, unspecified site: Secondary | ICD-10-CM | POA: Diagnosis not present

## 2021-07-18 DIAGNOSIS — M329 Systemic lupus erythematosus, unspecified: Secondary | ICD-10-CM | POA: Diagnosis not present

## 2021-07-22 DIAGNOSIS — M329 Systemic lupus erythematosus, unspecified: Secondary | ICD-10-CM | POA: Diagnosis not present

## 2021-08-12 ENCOUNTER — Other Ambulatory Visit (HOSPITAL_COMMUNITY): Payer: Self-pay | Admitting: Psychiatry

## 2021-08-21 ENCOUNTER — Telehealth (HOSPITAL_BASED_OUTPATIENT_CLINIC_OR_DEPARTMENT_OTHER): Payer: Medicare Other | Admitting: Psychiatry

## 2021-08-21 ENCOUNTER — Other Ambulatory Visit: Payer: Self-pay

## 2021-08-21 DIAGNOSIS — F3342 Major depressive disorder, recurrent, in full remission: Secondary | ICD-10-CM

## 2021-08-21 MED ORDER — BUPROPION HCL ER (XL) 300 MG PO TB24: ORAL_TABLET | ORAL | 4 refills | Status: DC

## 2021-08-21 MED ORDER — ZOLPIDEM TARTRATE ER 12.5 MG PO TBCR: 12.5000 mg | EXTENDED_RELEASE_TABLET | Freq: Every day | ORAL | 4 refills | Status: DC

## 2021-08-21 MED ORDER — ALPRAZOLAM 2 MG PO TABS: 2.0000 mg | ORAL_TABLET | Freq: Every day | ORAL | 4 refills | Status: DC

## 2021-08-21 NOTE — Progress Notes (Signed)
Psychiatric Initial Adult Assessment   Patient Identification: Lauren Chung MRN:  [id number] Date of Evaluation:  08/21/2021 Referral Source [facility] Chief Complaint:    Visit Diagnosis: Major depression  Today the patient is actually doing pretty well.  It turns out that she did not have COVID.  The patient physically feels well now.  She denies persistent depression.  Wellbutrin has been very helpful.  In the past we give her Remeron but apparently she gained a great deal of weight.  She is in fact lost 30 pounds over the last few months which I suspect came from being on Remeron.  The patient is happy in a very isolated lifestyle.  She lives with her 54 year old son who has autism.  The patient has no significant romance and has few friends.  The patient is active in college taking online classes for marketing.  The patient seems to be okay with that.  She denies being lonely or bored.  She got a reasonable amount of energy.  She drinks no alcohol uses no drugs.  She has never been psychotic.  She takes Wellbutrin without a problem.  Her anxiety is well controlled taking 2 mg of Xanax at night.  Presently she is not in therapy.  Generally I think she is doing well.  She is at her baseline.  She enjoys an isolated lifestyle. Anxiety Symptoms:   Psychotic Symptoms:   PTSD Symptoms:   Past Psychiatric History: Past therapy, one psychiatric hospitalization  Previous Psychotropic Medications: Yes   Substance Abuse History in the last 12 months:  No.  Consequences of Substance Abuse: Negative  Past Medical History:  Past Medical History:  Diagnosis Date   Anemia    Anxiety    Depression    History of blood transfusion    in [place]   Hyperlipidemia    Hypertension    Lupus ([place])    PE (pulmonary embolism)    Pneumonia    x 3 - last time 2017   PTSD (post-traumatic stress disorder)    Stroke ([place])    many years ago per patient, no deficits   SVD (spontaneous vaginal  delivery)    x 3   TIA (transient ischemic attack)    many years ago per patient, no deficits    Past Surgical History:  Procedure Laterality Date   ABDOMINAL SURGERY     ABLATION     gyn procedure for bleeding   cyst removed     right leg with sedation/anesthesia   EXCISION MASS HEAD N/A 08/05/2019   Procedure: EXCISION OF SCALP CYST;  Surgeon: Izora Gala, MD;  Location: [facility];  Service: ENT;  Laterality: N/A;    Family Psychiatric History:   Family History:  Family History  Problem Relation Age of Onset   Leukemia Father    Seizures Daughter    Drug abuse Mother    Alcohol abuse Mother     Social History:   Social History   Socioeconomic History   Marital status: Single    Spouse name: Not on file   Number of children: 3   Years of education: Not on file   Highest education level: Not on file  Occupational History   Not on file  Tobacco Use   Smoking status: Never   Smokeless tobacco: Never  Vaping Use   Vaping Use: Never used  Substance and Sexual Activity   Alcohol use: No   Drug use: No   Sexual activity: Yes  Partners: Male    Birth control/protection: Condom, Post-menopausal  Other Topics Concern   Not on file  Social History Narrative   Not on file   Social Determinants of Health   Financial Resource Strain: Not on file  Food Insecurity: Not on file  Transportation Needs: Not on file  Physical Activity: Not on file  Stress: Not on file  Social Connections: Not on file    Additional Social History:   Allergies:   Allergies  Allergen Reactions   Codeine Shortness Of Breath   Contrast Media [Iodinated Diagnostic Agents] Shortness Of Breath   Tylenol [Acetaminophen] Nausea And Vomiting    Metabolic Disorder Labs: No results found for: HGBA1C, MPG No results found for: PROLACTIN No results found for: CHOL, TRIG, HDL, CHOLHDL, VLDL, LDLCALC   Current Medications: Current Outpatient Medications  Medication Sig  Dispense Refill   alprazolam (XANAX) 2 MG tablet Take 1 tablet (2 mg total) by mouth at bedtime. 30 tablet 4   buPROPion (WELLBUTRIN XL) 300 MG 24 hr tablet Take one tablet (300mg ) by mouth daily. 30 tablet 4   metoprolol succinate (TOPROL-XL) 25 MG 24 hr tablet TAKE 1 TABLET (25 MG TOTAL) BY MOUTH IN THE MORNING. 90 tablet 1   rosuvastatin (CRESTOR) 10 MG tablet Take 10 mg by mouth at bedtime.      warfarin (COUMADIN) 2 MG tablet Take 3 mg by mouth daily.      zolpidem (AMBIEN CR) 12.5 MG CR tablet Take 1 tablet (12.5 mg total) by mouth at bedtime. 30 tablet 4   No current facility-administered medications for this visit.    Neurologic: Headache: No Seizure: No Paresthesias:No  Musculoskeletal: Strength & Muscle Tone: within normal limits Gait & Station: normal Patient leans: N/A  Psychiatric Specialty Exam: ROS  There were no vitals taken for this visit.There is no height or weight on file to calculate BMI.  General Appearance: Casual  Eye Contact:  Good  Speech:  Clear and Coherent  Volume:  Normal  Mood:  Dysphoric  Affect:  Appropriate  Thought Process:  Goal Directed  Orientation:  NA  Thought Content:  Logical  Suicidal Thoughts:  No  Homicidal Thoughts:  No  Memory:  NA  Judgement:  Good  Insight:  Good  Psychomotor Activity:  Normal  Concentration:    Recall:  [place] of Knowledge:Fair  Language: Good  Akathisia:  No  Handed:  Right  AIMS (if indicated):    Assets:  Desire for Improvement  ADL's:  Intact  Cognition: WNL  Sleep:      Treatment Plan Summary: 10/19/20222:40 PM   This patient's first problem is that of major clinical depression.  She is actually doing very well taking Wellbutrin 300 mg.  Her second problem is insomnia.  The patient will continue taking Ambien long-acting which works well.  Her third problem is that of adjustment disorder with an anxious mood state.  Presently she is taking Xanax 2 mg a day at a time which seems to work  well.  Overall her mood is stable.  She is functioning actually pretty well.  Medically she is stable.  She will return to see me hopefully in person in 4 to 5 months.

## 2021-09-02 DIAGNOSIS — N1831 Chronic kidney disease, stage 3a: Secondary | ICD-10-CM | POA: Diagnosis not present

## 2021-09-02 DIAGNOSIS — Z86718 Personal history of other venous thrombosis and embolism: Secondary | ICD-10-CM | POA: Diagnosis not present

## 2021-09-02 DIAGNOSIS — M1990 Unspecified osteoarthritis, unspecified site: Secondary | ICD-10-CM | POA: Diagnosis not present

## 2021-09-02 DIAGNOSIS — M2550 Pain in unspecified joint: Secondary | ICD-10-CM | POA: Diagnosis not present

## 2021-09-02 DIAGNOSIS — M797 Fibromyalgia: Secondary | ICD-10-CM | POA: Diagnosis not present

## 2021-09-02 DIAGNOSIS — M329 Systemic lupus erythematosus, unspecified: Secondary | ICD-10-CM | POA: Diagnosis not present

## 2021-09-02 DIAGNOSIS — M549 Dorsalgia, unspecified: Secondary | ICD-10-CM | POA: Diagnosis not present

## 2021-09-02 DIAGNOSIS — M791 Myalgia, unspecified site: Secondary | ICD-10-CM | POA: Diagnosis not present

## 2021-09-03 DIAGNOSIS — Z7901 Long term (current) use of anticoagulants: Secondary | ICD-10-CM | POA: Diagnosis not present

## 2021-09-03 DIAGNOSIS — Z86711 Personal history of pulmonary embolism: Secondary | ICD-10-CM | POA: Diagnosis not present

## 2021-09-19 DIAGNOSIS — Z86711 Personal history of pulmonary embolism: Secondary | ICD-10-CM | POA: Diagnosis not present

## 2021-09-19 DIAGNOSIS — Z7901 Long term (current) use of anticoagulants: Secondary | ICD-10-CM | POA: Diagnosis not present

## 2021-10-04 DIAGNOSIS — Z7901 Long term (current) use of anticoagulants: Secondary | ICD-10-CM | POA: Diagnosis not present

## 2021-10-19 ENCOUNTER — Other Ambulatory Visit (HOSPITAL_COMMUNITY): Payer: Self-pay | Admitting: Psychiatry

## 2021-10-20 ENCOUNTER — Other Ambulatory Visit (HOSPITAL_COMMUNITY): Payer: Self-pay | Admitting: Psychiatry

## 2021-11-07 DIAGNOSIS — M329 Systemic lupus erythematosus, unspecified: Secondary | ICD-10-CM | POA: Diagnosis not present

## 2021-11-07 DIAGNOSIS — K5900 Constipation, unspecified: Secondary | ICD-10-CM | POA: Diagnosis not present

## 2021-11-07 DIAGNOSIS — Z8673 Personal history of transient ischemic attack (TIA), and cerebral infarction without residual deficits: Secondary | ICD-10-CM | POA: Diagnosis not present

## 2021-11-07 DIAGNOSIS — I10 Essential (primary) hypertension: Secondary | ICD-10-CM | POA: Diagnosis not present

## 2021-11-07 DIAGNOSIS — N1832 Chronic kidney disease, stage 3b: Secondary | ICD-10-CM | POA: Diagnosis not present

## 2021-11-07 DIAGNOSIS — F418 Other specified anxiety disorders: Secondary | ICD-10-CM | POA: Diagnosis not present

## 2021-11-07 DIAGNOSIS — Z7901 Long term (current) use of anticoagulants: Secondary | ICD-10-CM | POA: Diagnosis not present

## 2021-11-07 DIAGNOSIS — Z86711 Personal history of pulmonary embolism: Secondary | ICD-10-CM | POA: Diagnosis not present

## 2021-11-07 DIAGNOSIS — G4700 Insomnia, unspecified: Secondary | ICD-10-CM | POA: Diagnosis not present

## 2021-11-07 DIAGNOSIS — M797 Fibromyalgia: Secondary | ICD-10-CM | POA: Diagnosis not present

## 2021-11-07 DIAGNOSIS — E7800 Pure hypercholesterolemia, unspecified: Secondary | ICD-10-CM | POA: Diagnosis not present

## 2021-11-07 DIAGNOSIS — D649 Anemia, unspecified: Secondary | ICD-10-CM | POA: Diagnosis not present

## 2021-11-16 ENCOUNTER — Other Ambulatory Visit: Payer: Self-pay | Admitting: Cardiology

## 2021-11-16 DIAGNOSIS — R002 Palpitations: Secondary | ICD-10-CM

## 2021-11-22 DIAGNOSIS — Z7901 Long term (current) use of anticoagulants: Secondary | ICD-10-CM | POA: Diagnosis not present

## 2021-12-03 DIAGNOSIS — N1831 Chronic kidney disease, stage 3a: Secondary | ICD-10-CM | POA: Diagnosis not present

## 2021-12-03 DIAGNOSIS — M797 Fibromyalgia: Secondary | ICD-10-CM | POA: Diagnosis not present

## 2021-12-03 DIAGNOSIS — M791 Myalgia, unspecified site: Secondary | ICD-10-CM | POA: Diagnosis not present

## 2021-12-03 DIAGNOSIS — M1990 Unspecified osteoarthritis, unspecified site: Secondary | ICD-10-CM | POA: Diagnosis not present

## 2021-12-03 DIAGNOSIS — M549 Dorsalgia, unspecified: Secondary | ICD-10-CM | POA: Diagnosis not present

## 2021-12-03 DIAGNOSIS — M2550 Pain in unspecified joint: Secondary | ICD-10-CM | POA: Diagnosis not present

## 2021-12-03 DIAGNOSIS — M329 Systemic lupus erythematosus, unspecified: Secondary | ICD-10-CM | POA: Diagnosis not present

## 2021-12-03 DIAGNOSIS — Z86718 Personal history of other venous thrombosis and embolism: Secondary | ICD-10-CM | POA: Diagnosis not present

## 2021-12-06 DIAGNOSIS — Z86711 Personal history of pulmonary embolism: Secondary | ICD-10-CM | POA: Diagnosis not present

## 2021-12-06 DIAGNOSIS — Z7901 Long term (current) use of anticoagulants: Secondary | ICD-10-CM | POA: Diagnosis not present

## 2021-12-18 ENCOUNTER — Other Ambulatory Visit (HOSPITAL_COMMUNITY): Payer: Self-pay | Admitting: Psychiatry

## 2021-12-18 MED ORDER — ALPRAZOLAM 2 MG PO TABS: 2.0000 mg | ORAL_TABLET | Freq: Every day | ORAL | 4 refills | Status: DC

## 2021-12-18 MED ORDER — ZOLPIDEM TARTRATE ER 12.5 MG PO TBCR: 12.5000 mg | EXTENDED_RELEASE_TABLET | Freq: Every day | ORAL | 4 refills | Status: DC

## 2021-12-20 DIAGNOSIS — Z86711 Personal history of pulmonary embolism: Secondary | ICD-10-CM | POA: Diagnosis not present

## 2021-12-20 DIAGNOSIS — Z7901 Long term (current) use of anticoagulants: Secondary | ICD-10-CM | POA: Diagnosis not present

## 2021-12-25 ENCOUNTER — Other Ambulatory Visit (HOSPITAL_COMMUNITY): Payer: Self-pay | Admitting: Psychiatry

## 2021-12-25 ENCOUNTER — Ambulatory Visit (HOSPITAL_BASED_OUTPATIENT_CLINIC_OR_DEPARTMENT_OTHER): Payer: Medicare Other | Admitting: Psychiatry

## 2021-12-25 ENCOUNTER — Other Ambulatory Visit: Payer: Self-pay

## 2021-12-25 DIAGNOSIS — F320 Major depressive disorder, single episode, mild: Secondary | ICD-10-CM

## 2021-12-25 MED ORDER — BUPROPION HCL ER (XL) 150 MG PO TB24: ORAL_TABLET | ORAL | 5 refills | Status: DC

## 2021-12-25 MED ORDER — ALPRAZOLAM 2 MG PO TABS: ORAL_TABLET | ORAL | 4 refills | Status: DC

## 2021-12-25 MED ORDER — ZOLPIDEM TARTRATE ER 12.5 MG PO TBCR: 12.5000 mg | EXTENDED_RELEASE_TABLET | Freq: Every day | ORAL | 4 refills | Status: DC

## 2021-12-25 NOTE — Progress Notes (Signed)
Psychiatric Initial Adult Assessment   Patient Identification: Lauren Chung MRN:  [id number] Date of Evaluation:  12/25/2021 Referral Source [facility] Chief Complaint:    Visit Diagnosis: Major depression Today the patient is not doing well.  Patient is very frightened and scared.  She has a lot of anxiety.  She feels very comfortable coming out of her home.  She worked very hard to come to see me today.  She feels frightened.  She has a lot of anticipatory anxiety.  She catastrophize this.  Her anxiety is gotten much worse after the last 6 to 12 months for reasons that are not clear.  It is noted this patient does have lupus and there is evidence that it is affecting her kidneys.  It also affects her joints.  The patient has 3 children.  One of them is a 56 year old son who is autistic who she lives with.  In close evaluation the patient describes symptoms consistent with generalized anxiety disorder.  She has a lot of irritability musculoskeletal strain problems sleeping and anticipatory anxiety about everything.  She is a chronic Research officer, trade [employer].  In combination with this she feels depression on a day-to-day basis.  Her sleep is affected and erratic.  She is eating okay and her concentration is fair but she does describe a sense of worthlessness.  She denies being suicidal.  She denies any use of alcohol or drugs.  She is not psychotic.  She says the Ambien at night helps to some degree.  The patient does enjoy 3 dogs that she has and apparently watches a little bit of TV.  Presently she is not in therapy.   Anxiety Symptoms:   Psychotic Symptoms:   PTSD Symptoms:   Past Psychiatric History: Past therapy, one psychiatric hospitalization  Previous Psychotropic Medications: Yes   Substance Abuse History in the last 12 months:  No.  Consequences of Substance Abuse: Negative  Past Medical History:  Past Medical History:  Diagnosis Date   Anemia    Anxiety    Depression    History of  blood transfusion    in [place]   Hyperlipidemia    Hypertension    Lupus ([place])    PE (pulmonary embolism)    Pneumonia    x 3 - last time 2017   PTSD (post-traumatic stress disorder)    Stroke ([place])    many years ago per patient, no deficits   SVD (spontaneous vaginal delivery)    x 3   TIA (transient ischemic attack)    many years ago per patient, no deficits    Past Surgical History:  Procedure Laterality Date   ABDOMINAL SURGERY     ABLATION     gyn procedure for bleeding   cyst removed     right leg with sedation/anesthesia   EXCISION MASS HEAD N/A 08/05/2019   Procedure: EXCISION OF SCALP CYST;  Surgeon: Izora Gala, MD;  Location: [facility];  Service: ENT;  Laterality: N/A;    Family Psychiatric History:   Family History:  Family History  Problem Relation Age of Onset   Leukemia Father    Seizures Daughter    Drug abuse Mother    Alcohol abuse Mother     Social History:   Social History   Socioeconomic History   Marital status: Single    Spouse name: Not on file   Number of children: 3   Years of education: Not on file   Highest education level: Not on file  Occupational History   Not on file  Tobacco Use   Smoking status: Never   Smokeless tobacco: Never  Vaping Use   Vaping Use: Never used  Substance and Sexual Activity   Alcohol use: No   Drug use: No   Sexual activity: Yes    Partners: Male    Birth control/protection: Condom, Post-menopausal  Other Topics Concern   Not on file  Social History Narrative   Not on file   Social Determinants of Health   Financial Resource Strain: Not on file  Food Insecurity: Not on file  Transportation Needs: Not on file  Physical Activity: Not on file  Stress: Not on file  Social Connections: Not on file    Additional Social History:   Allergies:   Allergies  Allergen Reactions   Codeine Shortness Of Breath   Contrast Media [Iodinated Contrast Media] Shortness Of Breath   Tylenol  [Acetaminophen] Nausea And Vomiting    Metabolic Disorder Labs: No results found for: HGBA1C, MPG No results found for: PROLACTIN No results found for: CHOL, TRIG, HDL, CHOLHDL, VLDL, LDLCALC   Current Medications: Current Outpatient Medications  Medication Sig Dispense Refill   alprazolam (XANAX) 2 MG tablet Half  qam   1  qhs 45 tablet 4   buPROPion (WELLBUTRIN XL) 150 MG 24 hr tablet 3  qam 90 tablet 5   metoprolol succinate (TOPROL-XL) 25 MG 24 hr tablet TAKE 1 TABLET (25 MG TOTAL) BY MOUTH IN THE MORNING. 90 tablet 1   rosuvastatin (CRESTOR) 10 MG tablet Take 10 mg by mouth at bedtime.      warfarin (COUMADIN) 2 MG tablet Take 3 mg by mouth daily.      zolpidem (AMBIEN CR) 12.5 MG CR tablet Take 1 tablet (12.5 mg total) by mouth at bedtime. 30 tablet 4   No current facility-administered medications for this visit.    Neurologic: Headache: No Seizure: No Paresthesias:No  Musculoskeletal: Strength & Muscle Tone: within normal limits Gait & Station: normal Patient leans: N/A  Psychiatric Specialty Exam: ROS  There were no vitals taken for this visit.There is no height or weight on file to calculate BMI.  General Appearance: Casual  Eye Contact:  Good  Speech:  Clear and Coherent  Volume:  Normal  Mood:  Dysphoric  Affect:  Appropriate  Thought Process:  Goal Directed  Orientation:  NA  Thought Content:  Logical  Suicidal Thoughts:  No  Homicidal Thoughts:  No  Memory:  NA  Judgement:  Good  Insight:  Good  Psychomotor Activity:  Normal  Concentration:    Recall:  [place] of Knowledge:Fair  Language: Good  Akathisia:  No  Handed:  Right  AIMS (if indicated):    Assets:  Desire for Improvement  ADL's:  Intact  Cognition: WNL  Sleep:      Treatment Plan Summary: 2/22/20232:44 PM    I believe this patient has 2 problems.  The first is generalized anxiety disorder.  Presently she is not actively being treated for this.  Today we will go ahead and  increase her Xanax to taking 1 mg in the morning and 2 mg at night.  This is her only intervention at this point for generalized anxiety disorder.  She had a hard time with other SSRIs which we will review at her next visit.  She is gotten somewhat of a benefit from Wellbutrin but it is not adequate.  So her second problem is major depression.  Today we  will increase her Wellbutrin to the maximum dose of 450 mg.  Her third problem is insomnia.  She will continue taking Ambien at night together with 2 mg of Xanax.  This patient she will return to see me in 7 to 8 weeks.  We will talk about a therapist at that time and review the medicines for generalized anxiety disorder.  Patient is not suicidal.

## 2022-01-17 DIAGNOSIS — Z7901 Long term (current) use of anticoagulants: Secondary | ICD-10-CM | POA: Diagnosis not present

## 2022-01-22 ENCOUNTER — Other Ambulatory Visit (HOSPITAL_COMMUNITY): Payer: Self-pay | Admitting: *Deleted

## 2022-01-29 ENCOUNTER — Other Ambulatory Visit (HOSPITAL_COMMUNITY): Payer: Self-pay | Admitting: Psychiatry

## 2022-02-18 ENCOUNTER — Telehealth (HOSPITAL_COMMUNITY): Payer: Medicare Other | Admitting: Psychiatry

## 2022-02-19 ENCOUNTER — Telehealth (HOSPITAL_BASED_OUTPATIENT_CLINIC_OR_DEPARTMENT_OTHER): Payer: Medicare Other | Admitting: Psychiatry

## 2022-02-19 DIAGNOSIS — F329 Major depressive disorder, single episode, unspecified: Secondary | ICD-10-CM | POA: Diagnosis not present

## 2022-02-19 MED ORDER — BUPROPION HCL ER (XL) 300 MG PO TB24: 300.0000 mg | ORAL_TABLET | Freq: Every day | ORAL | 4 refills | Status: DC

## 2022-02-19 MED ORDER — BUPROPION HCL ER (XL) 150 MG PO TB24: ORAL_TABLET | ORAL | 5 refills | Status: DC

## 2022-02-19 MED ORDER — ALPRAZOLAM 1 MG PO TABS: ORAL_TABLET | ORAL | 4 refills | Status: DC

## 2022-02-19 NOTE — Progress Notes (Addendum)
Psychiatric Initial Adult Assessment   Patient Identification: Lauren Chung MRN:  [id number] Date of Evaluation:  02/19/2022 Referral Source [facility] Chief Complaint:    Visit Diagnosis: Major depression   Today unfortunately the patient is no better.  It turns out there was some pharmacy confusion.  They ended up not increasing her Wellbutrin to a dose of 450 and they ended up not increasing her Xanax at all.  There is some confusion and that the Xanax previous prescription needed to be canceled in order her for her to access the newer higher dose.  Therefore I like her to be on Xanax 1 mg in the morning and 2 mg at night.  That will be called in.  Further and likely more importantly is to increase her Wellbutrin to a full dose of 450 mg.  The patient shares that she has not really changed that much.  She continues to be isolated and withdrawn.  She does not get out.  She is bothered by people calling her.  She continues to take care of her dog and her autistic son.  She is sleeping and eating fairly well.  Anxiety Symptoms:   Psychotic Symptoms:   PTSD Symptoms:   Past Psychiatric History: Past therapy, one psychiatric hospitalization  Previous Psychotropic Medications: Yes   Substance Abuse History in the last 12 months:  No.  Consequences of Substance Abuse: Negative  Past Medical History:  Past Medical History:  Diagnosis Date   Anemia    Anxiety    Depression    History of blood transfusion    in [place]   Hyperlipidemia    Hypertension    Lupus ([place])    PE (pulmonary embolism)    Pneumonia    x 3 - last time 2017   PTSD (post-traumatic stress disorder)    Stroke ([place])    many years ago per patient, no deficits   SVD (spontaneous vaginal delivery)    x 3   TIA (transient ischemic attack)    many years ago per patient, no deficits    Past Surgical History:  Procedure Laterality Date   ABDOMINAL SURGERY     ABLATION     gyn procedure for bleeding   cyst  removed     right leg with sedation/anesthesia   EXCISION MASS HEAD N/A 08/05/2019   Procedure: EXCISION OF SCALP CYST;  Surgeon: Izora Gala, MD;  Location: [facility];  Service: ENT;  Laterality: N/A;    Family Psychiatric History:   Family History:  Family History  Problem Relation Age of Onset   Leukemia Father    Seizures Daughter    Drug abuse Mother    Alcohol abuse Mother     Social History:   Social History   Socioeconomic History   Marital status: Single    Spouse name: Not on file   Number of children: 3   Years of education: Not on file   Highest education level: Not on file  Occupational History   Not on file  Tobacco Use   Smoking status: Never   Smokeless tobacco: Never  Vaping Use   Vaping Use: Never used  Substance and Sexual Activity   Alcohol use: No   Drug use: No   Sexual activity: Yes    Partners: Male    Birth control/protection: Condom, Post-menopausal  Other Topics Concern   Not on file  Social History Narrative   Not on file   Social Determinants of Health  Financial Resource Strain: Not on file  Food Insecurity: Not on file  Transportation Needs: Not on file  Physical Activity: Not on file  Stress: Not on file  Social Connections: Not on file    Additional Social History:   Allergies:   Allergies  Allergen Reactions   Codeine Shortness Of Breath   Contrast Media [Iodinated Contrast Media] Shortness Of Breath   Tylenol [Acetaminophen] Nausea And Vomiting    Metabolic Disorder Labs: No results found for: HGBA1C, MPG No results found for: PROLACTIN No results found for: CHOL, TRIG, HDL, CHOLHDL, VLDL, LDLCALC   Current Medications: Current Outpatient Medications  Medication Sig Dispense Refill   buPROPion (WELLBUTRIN XL) 300 MG 24 hr tablet Take 1 tablet (300 mg total) by mouth daily. 1  qam 304 tablet 4   alprazolam (XANAX) 1 MG tablet 1 qam  2 qhs 90 tablet 4   buPROPion (WELLBUTRIN XL) 150 MG 24 hr  tablet 1 qam 30 tablet 5   metoprolol succinate (TOPROL-XL) 25 MG 24 hr tablet TAKE 1 TABLET (25 MG TOTAL) BY MOUTH IN THE MORNING. 90 tablet 1   rosuvastatin (CRESTOR) 10 MG tablet Take 10 mg by mouth at bedtime.      warfarin (COUMADIN) 2 MG tablet Take 3 mg by mouth daily.      zolpidem (AMBIEN CR) 12.5 MG CR tablet Take 1 tablet (12.5 mg total) by mouth at bedtime. 30 tablet 4   No current facility-administered medications for this visit.    Neurologic: Headache: No Seizure: No Paresthesias:No  Musculoskeletal: Strength & Muscle Tone: within normal limits Gait & Station: normal Patient leans: N/A  Psychiatric Specialty Exam: ROS  There were no vitals taken for this visit.There is no height or weight on file to calculate BMI.  General Appearance: Casual  Eye Contact:  Good  Speech:  Clear and Coherent  Volume:  Normal  Mood:  Dysphoric  Affect:  Appropriate  Thought Process:  Goal Directed  Orientation:  NA  Thought Content:  Logical  Suicidal Thoughts:  No  Homicidal Thoughts:  No  Memory:  NA  Judgement:  Good  Insight:  Good  Psychomotor Activity:  Normal  Concentration:    Recall:  [place] of Knowledge:Fair  Language: Good  Akathisia:  No  Handed:  Right  AIMS (if indicated):    Assets:  Desire for Improvement  ADL's:  Intact  Cognition: WNL  Sleep:      Treatment Plan Summary: 4/19/20233:29 PM     This patient has 2 major diagnoses.  First of generalized anxiety disorder.  She apparently is intolerant of SSRIs and gained a great deal of weight on Remeron.  Today we went ahead and tried to increase her Xanax 1 more time to a dose of 1 mg 1 in the morning and 2 at night.  She will increase her Wellbutrin to a total dose of 450 mg.  I believe her second diagnosis is major depression.  This patient will be reevaluated in 6 to 7 weeks.  I do not think she is suicidal. Spent 30 minutes  called patient from center  patient at home Phone visit

## 2022-02-21 ENCOUNTER — Ambulatory Visit: Payer: Medicare Other | Admitting: Cardiology

## 2022-03-05 ENCOUNTER — Observation Stay (HOSPITAL_COMMUNITY): Payer: Medicare Other

## 2022-03-05 ENCOUNTER — Encounter (HOSPITAL_COMMUNITY): Payer: Self-pay

## 2022-03-05 ENCOUNTER — Emergency Department (HOSPITAL_COMMUNITY): Payer: Medicare Other

## 2022-03-05 ENCOUNTER — Other Ambulatory Visit: Payer: Self-pay

## 2022-03-05 ENCOUNTER — Observation Stay (HOSPITAL_COMMUNITY)
Admission: EM | Admit: 2022-03-05 | Discharge: 2022-03-06 | Disposition: A | Discharge status: Home / Self Care | Payer: Medicare Other | Attending: Internal Medicine | Admitting: Internal Medicine

## 2022-03-05 DIAGNOSIS — R42 Dizziness and giddiness: Secondary | ICD-10-CM | POA: Diagnosis not present

## 2022-03-05 DIAGNOSIS — I6782 Cerebral ischemia: Secondary | ICD-10-CM | POA: Diagnosis not present

## 2022-03-05 DIAGNOSIS — F4310 Post-traumatic stress disorder, unspecified: Secondary | ICD-10-CM | POA: Insufficient documentation

## 2022-03-05 DIAGNOSIS — Z8673 Personal history of transient ischemic attack (TIA), and cerebral infarction without residual deficits: Secondary | ICD-10-CM | POA: Diagnosis not present

## 2022-03-05 DIAGNOSIS — R4182 Altered mental status, unspecified: Secondary | ICD-10-CM | POA: Insufficient documentation

## 2022-03-05 DIAGNOSIS — R9082 White matter disease, unspecified: Secondary | ICD-10-CM | POA: Insufficient documentation

## 2022-03-05 DIAGNOSIS — F32A Depression, unspecified: Secondary | ICD-10-CM | POA: Insufficient documentation

## 2022-03-05 DIAGNOSIS — G459 Transient cerebral ischemic attack, unspecified: Secondary | ICD-10-CM | POA: Diagnosis not present

## 2022-03-05 DIAGNOSIS — Z7901 Long term (current) use of anticoagulants: Secondary | ICD-10-CM | POA: Insufficient documentation

## 2022-03-05 DIAGNOSIS — D649 Anemia, unspecified: Secondary | ICD-10-CM | POA: Diagnosis not present

## 2022-03-05 DIAGNOSIS — M329 Systemic lupus erythematosus, unspecified: Secondary | ICD-10-CM

## 2022-03-05 DIAGNOSIS — Z79899 Other long term (current) drug therapy: Secondary | ICD-10-CM | POA: Diagnosis not present

## 2022-03-05 DIAGNOSIS — F419 Anxiety disorder, unspecified: Secondary | ICD-10-CM | POA: Insufficient documentation

## 2022-03-05 DIAGNOSIS — G9389 Other specified disorders of brain: Secondary | ICD-10-CM | POA: Insufficient documentation

## 2022-03-05 DIAGNOSIS — R262 Difficulty in walking, not elsewhere classified: Secondary | ICD-10-CM | POA: Insufficient documentation

## 2022-03-05 DIAGNOSIS — R471 Dysarthria and anarthria: Secondary | ICD-10-CM | POA: Insufficient documentation

## 2022-03-05 DIAGNOSIS — R29818 Other symptoms and signs involving the nervous system: Secondary | ICD-10-CM | POA: Diagnosis not present

## 2022-03-05 DIAGNOSIS — R531 Weakness: Secondary | ICD-10-CM | POA: Diagnosis not present

## 2022-03-05 DIAGNOSIS — R4781 Slurred speech: Secondary | ICD-10-CM | POA: Insufficient documentation

## 2022-03-05 DIAGNOSIS — R0602 Shortness of breath: Secondary | ICD-10-CM | POA: Diagnosis not present

## 2022-03-05 DIAGNOSIS — I10 Essential (primary) hypertension: Secondary | ICD-10-CM | POA: Diagnosis not present

## 2022-03-05 DIAGNOSIS — I129 Hypertensive chronic kidney disease with stage 1 through stage 4 chronic kidney disease, or unspecified chronic kidney disease: Secondary | ICD-10-CM | POA: Diagnosis not present

## 2022-03-05 DIAGNOSIS — N1831 Chronic kidney disease, stage 3a: Secondary | ICD-10-CM | POA: Insufficient documentation

## 2022-03-05 DIAGNOSIS — R4701 Aphasia: Secondary | ICD-10-CM | POA: Diagnosis not present

## 2022-03-05 DIAGNOSIS — Z86711 Personal history of pulmonary embolism: Secondary | ICD-10-CM

## 2022-03-05 DIAGNOSIS — Z20822 Contact with and (suspected) exposure to covid-19: Secondary | ICD-10-CM | POA: Insufficient documentation

## 2022-03-05 DIAGNOSIS — R29898 Other symptoms and signs involving the musculoskeletal system: Secondary | ICD-10-CM | POA: Insufficient documentation

## 2022-03-05 DIAGNOSIS — R7989 Other specified abnormal findings of blood chemistry: Secondary | ICD-10-CM | POA: Diagnosis not present

## 2022-03-05 DIAGNOSIS — R4702 Dysphasia: Secondary | ICD-10-CM | POA: Insufficient documentation

## 2022-03-05 DIAGNOSIS — R9431 Abnormal electrocardiogram [ECG] [EKG]: Secondary | ICD-10-CM | POA: Insufficient documentation

## 2022-03-05 DIAGNOSIS — E785 Hyperlipidemia, unspecified: Secondary | ICD-10-CM | POA: Diagnosis not present

## 2022-03-05 LAB — RAPID URINE DRUG SCREEN, HOSP PERFORMED
Amphetamines: NOT DETECTED
Barbiturates: NOT DETECTED
Benzodiazepines: POSITIVE — AB
Cocaine: NOT DETECTED
Opiates: NOT DETECTED
Tetrahydrocannabinol: NOT DETECTED

## 2022-03-05 LAB — CBC
HCT: 35.8 % — ABNORMAL LOW (ref 36.0–46.0)
Hemoglobin: 10.9 g/dL — ABNORMAL LOW (ref 12.0–15.0)
MCH: 24.5 pg — ABNORMAL LOW (ref 26.0–34.0)
MCHC: 30.4 g/dL (ref 30.0–36.0)
MCV: 80.4 fL (ref 80.0–100.0)
Platelets: 288 10*3/uL (ref 150–400)
RBC: 4.45 MIL/uL (ref 3.87–5.11)
RDW: 17 % — ABNORMAL HIGH (ref 11.5–15.5)
WBC: 8.8 10*3/uL (ref 4.0–10.5)
nRBC: 0 % (ref 0.0–0.2)

## 2022-03-05 LAB — DIFFERENTIAL
Abs Immature Granulocytes: 0.03 10*3/uL (ref 0.00–0.07)
Basophils Absolute: 0.1 10*3/uL (ref 0.0–0.1)
Basophils Relative: 1 %
Eosinophils Absolute: 0.1 10*3/uL (ref 0.0–0.5)
Eosinophils Relative: 2 %
Immature Granulocytes: 0 %
Lymphocytes Relative: 16 %
Lymphs Abs: 1.4 10*3/uL (ref 0.7–4.0)
Monocytes Absolute: 0.4 10*3/uL (ref 0.1–1.0)
Monocytes Relative: 5 %
Neutro Abs: 6.8 10*3/uL (ref 1.7–7.7)
Neutrophils Relative %: 76 %

## 2022-03-05 LAB — URINALYSIS, ROUTINE W REFLEX MICROSCOPIC
Bacteria, UA: NONE SEEN
Bilirubin Urine: NEGATIVE
Glucose, UA: NEGATIVE mg/dL
Hgb urine dipstick: NEGATIVE
Ketones, ur: 5 mg/dL — AB
Nitrite: NEGATIVE
Protein, ur: NEGATIVE mg/dL
Specific Gravity, Urine: 1.005 (ref 1.005–1.030)
pH: 8 (ref 5.0–8.0)

## 2022-03-05 LAB — COMPREHENSIVE METABOLIC PANEL
ALT: 26 U/L (ref 0–44)
AST: 36 U/L (ref 15–41)
Albumin: 3.9 g/dL (ref 3.5–5.0)
Alkaline Phosphatase: 66 U/L (ref 38–126)
Anion gap: 10 (ref 5–15)
BUN: 12 mg/dL (ref 6–20)
CO2: 23 mmol/L (ref 22–32)
Calcium: 8.9 mg/dL (ref 8.9–10.3)
Chloride: 104 mmol/L (ref 98–111)
Creatinine, Ser: 1.5 mg/dL — ABNORMAL HIGH (ref 0.44–1.00)
GFR, Estimated: 41 mL/min — ABNORMAL LOW (ref >60)
Glucose, Bld: 83 mg/dL (ref 70–99)
Potassium: 3.6 mmol/L (ref 3.5–5.1)
Sodium: 137 mmol/L (ref 135–145)
Total Bilirubin: 0.6 mg/dL (ref 0.3–1.2)
Total Protein: 7.1 g/dL (ref 6.5–8.1)

## 2022-03-05 LAB — HEMOGLOBIN A1C
Hgb A1c MFr Bld: 5 % (ref 4.8–5.6)
Mean Plasma Glucose: 96.8 mg/dL

## 2022-03-05 LAB — I-STAT CHEM 8, ED
BUN: 13 mg/dL (ref 6–20)
Calcium, Ion: 1.1 mmol/L — ABNORMAL LOW (ref 1.15–1.40)
Chloride: 101 mmol/L (ref 98–111)
Creatinine, Ser: 1.5 mg/dL — ABNORMAL HIGH (ref 0.44–1.00)
Glucose, Bld: 78 mg/dL (ref 70–99)
HCT: 38 % (ref 36.0–46.0)
Hemoglobin: 12.9 g/dL (ref 12.0–15.0)
Potassium: 3.7 mmol/L (ref 3.5–5.1)
Sodium: 140 mmol/L (ref 135–145)
TCO2: 23 mmol/L (ref 22–32)

## 2022-03-05 LAB — HIV ANTIBODY (ROUTINE TESTING W REFLEX): HIV Screen 4th Generation wRfx: NONREACTIVE

## 2022-03-05 LAB — ETHANOL: Alcohol, Ethyl (B): <10 mg/dL (ref <10)

## 2022-03-05 LAB — APTT: aPTT: 26 seconds (ref 24–36)

## 2022-03-05 LAB — RESP PANEL BY RT-PCR (FLU A&B, COVID) ARPGX2
Influenza A by PCR: NEGATIVE
Influenza B by PCR: NEGATIVE
SARS Coronavirus 2 by RT PCR: NEGATIVE

## 2022-03-05 LAB — I-STAT BETA HCG BLOOD, ED (MC, WL, AP ONLY): I-stat hCG, quantitative: 12.5 m[IU]/mL — ABNORMAL HIGH (ref <5)

## 2022-03-05 LAB — PROTIME-INR
INR: 1.5 — ABNORMAL HIGH (ref 0.8–1.2)
Prothrombin Time: 17.5 seconds — ABNORMAL HIGH (ref 11.4–15.2)

## 2022-03-05 MED ORDER — WARFARIN SODIUM 2 MG PO TABS
2.0000 mg | ORAL_TABLET | Freq: Once | ORAL | Status: AC
  Administered 2022-03-06: 2 mg via ORAL
  Filled 2022-03-05: qty 1

## 2022-03-05 MED ORDER — WARFARIN - PHARMACIST DOSING INPATIENT: Freq: Every day | Status: DC

## 2022-03-05 MED ORDER — SENNOSIDES-DOCUSATE SODIUM 8.6-50 MG PO TABS: 1.0000 | ORAL_TABLET | Freq: Every evening | ORAL | Status: DC | PRN

## 2022-03-05 MED ORDER — LORAZEPAM 2 MG/ML IJ SOLN: 0.5000 mg | Freq: Once | INTRAMUSCULAR | Status: DC | PRN

## 2022-03-05 MED ORDER — STROKE: EARLY STAGES OF RECOVERY BOOK: Freq: Once | Status: DC

## 2022-03-05 NOTE — ED Notes (Signed)
Provider at bedside

## 2022-03-05 NOTE — ED Triage Notes (Signed)
Pt arrived w/dizziness, slurred speech, aphasia. LKW possibly 2217 yesterday. Pt was getting out of WC and was having trouble and almost fell, because her coordination was off. ?

## 2022-03-05 NOTE — Assessment & Plan Note (Addendum)
Remained stable ?Pt to cont home meds on d/c ?

## 2022-03-05 NOTE — Assessment & Plan Note (Addendum)
Would resume home meds on d/c ?Advised pt to f/u closely with primary Rheumatologist ?Please follow up on ANA, Cardiolipin ab, homocysteine, lupus anticoagulant labs that were drawn 5/4 ?

## 2022-03-05 NOTE — ED Provider Notes (Signed)
?[facility] ?Provider Note ? ? ?CSN: [id number] ?Arrival date & time: 03/05/22  1529 ? ?  ? ?History ? ?Chief Complaint  ?Patient presents with  ? stroke sx  ? ? ?Lauren Chung is a 56 y.o. female. ? ?HPI ?Patient presents feeling bad.  For 2 to 3 days now has had dizziness unsteadiness some confusion.  Had difficulty texting.  Difficulty finding the words.  Would text sort of nonsensical words also.  Had some abdominal pain.  Also had a severe headache.  Headaches improved somewhat.  Abdomen is improved.  Had difficulty walking however in the ER.  Has a history of lupus and is on Coumadin for previous pulm embolisms.  Previous history of stroke or TIA years ago. ?  ?Past Medical History:  ?Diagnosis Date  ? Anemia   ? Anxiety   ? Depression   ? History of blood transfusion   ? in [place]  ? Hyperlipidemia   ? Hypertension   ? Lupus ([place])   ? PE (pulmonary embolism)   ? Pneumonia   ? x 3 - last time 2017  ? PTSD (post-traumatic stress disorder)   ? Stroke [facility])   ? many years ago per patient, no deficits  ? SVD (spontaneous vaginal delivery)   ? x 3  ? TIA (transient ischemic attack)   ? many years ago per patient, no deficits  ? ? ?Home Medications ?Prior to Admission medications   ?Medication Sig Start Date End Date Taking? Authorizing Provider  ?alprazolam (XANAX) 1 MG tablet 1 qam  2 qhs 02/19/22   Plovsky, Berneta Sages, MD  ?buPROPion (WELLBUTRIN XL) 150 MG 24 hr tablet 1 qam 02/19/22   Plovsky, Berneta Sages, MD  ?buPROPion (WELLBUTRIN XL) 300 MG 24 hr tablet Take 1 tablet (300 mg total) by mouth daily. 1  qam 02/19/22   Plovsky, Berneta Sages, MD  ?metoprolol succinate (TOPROL-XL) 25 MG 24 hr tablet TAKE 1 TABLET (25 MG TOTAL) BY MOUTH IN THE MORNING. 11/18/21   Tolia, Sunit, DO  ?rosuvastatin (CRESTOR) 10 MG tablet Take 10 mg by mouth at bedtime.     [provider]  ?warfarin (COUMADIN) 2 MG tablet Take 3 mg by mouth daily.     [provider]  ?zolpidem (AMBIEN CR) 12.5 MG CR  tablet Take 1 tablet (12.5 mg total) by mouth at bedtime. 12/25/21   Norma Fredrickson, MD  ?   ? ?Allergies    ?Codeine, Contrast media [iodinated contrast media], and Tylenol [acetaminophen]   ? ?Review of Systems   ?Review of Systems ? ?Physical Exam ?Updated Vital Signs ?BP (!) 142/88   Pulse 82   Temp 98.6 ?F (37 ?C) (Oral)   Resp 18   Ht '5\' 8"'$  (1.727 m)   Wt 84.4 kg   LMP  (LMP Unknown)   SpO2 99%   BMI 28.29 kg/m?  ?Physical Exam ?Vitals and nursing note reviewed.  ?HENT:  ?   Head: Atraumatic.  ?Eyes:  ?   Extraocular Movements: Extraocular movements intact.  ?   Pupils: Pupils are equal, round, and reactive to light.  ?Cardiovascular:  ?   Rate and Rhythm: Regular rhythm.  ?Abdominal:  ?   Tenderness: There is no abdominal tenderness.  ?Skin: ?   Capillary Refill: Capillary refill takes less than 2 seconds.  ?Neurological:  ?   Mental Status: She is alert.  ?   Comments: Face symmetric.  Equal smile.  Eye movements intact.  Finger-nose slightly off on  left side.  Decree strength on straight leg raise on left compared to right.  Appears to have good strength in left upper extremity.  ? ? ?ED Results / Procedures / Treatments   ?Labs ?(all labs ordered are listed, but only abnormal results are displayed) ?Labs Reviewed  ?PROTIME-INR - Abnormal; Notable for the following components:  ?    Result Value  ? Prothrombin Time 17.5 (*)   ? INR 1.5 (*)   ? All other components within normal limits  ?CBC - Abnormal; Notable for the following components:  ? Hemoglobin 10.9 (*)   ? HCT 35.8 (*)   ? MCH 24.5 (*)   ? RDW 17.0 (*)   ? All other components within normal limits  ?COMPREHENSIVE METABOLIC PANEL - Abnormal; Notable for the following components:  ? Creatinine, Ser 1.50 (*)   ? GFR, Estimated 41 (*)   ? All other components within normal limits  ?I-STAT CHEM 8, ED - Abnormal; Notable for the following components:  ? Creatinine, Ser 1.50 (*)   ? Calcium, Ion 1.10 (*)   ? All other components within normal  limits  ?I-STAT BETA HCG BLOOD, ED ([facility], [facility], [facility] ONLY) - Abnormal; Notable for the following components:  ? I-stat hCG, quantitative 12.5 (*)   ? All other components within normal limits  ?RESP PANEL BY RT-PCR (FLU A&B, COVID) ARPGX2  ?ETHANOL  ?APTT  ?DIFFERENTIAL  ?RAPID URINE DRUG SCREEN, HOSP PERFORMED  ?URINALYSIS, ROUTINE W REFLEX MICROSCOPIC  ? ? ?EKG ?EKG Interpretation ? ?Date/Time:  Wednesday Mar 05 2022 15:59:51 EDT ?Ventricular Rate:  81 ?PR Interval:  166 ?QRS Duration: 80 ?QT Interval:  392 ?QTC Calculation: 455 ?R Axis:   74 ?Text Interpretation: Normal sinus rhythm Nonspecific ST and T wave abnormality Abnormal ECG When compared with ECG of 12-Nov-2018 10:35, No significant change since last tracing Confirmed by Davonna Belling [phone]) on 03/05/2022 6:46:12 PM ? ?Radiology ?CT HEAD WO CONTRAST ? ?Result Date: 03/05/2022 ?CLINICAL DATA:  Mental status change, unknown cause. Dizziness and speech disturbance. EXAM: CT HEAD WITHOUT CONTRAST TECHNIQUE: Contiguous axial images were obtained from the base of the skull through the vertex without intravenous contrast. RADIATION DOSE REDUCTION: This exam was performed according to the departmental dose-optimization program which includes automated exposure control, adjustment of the mA and/or kV according to patient size and/or use of iterative reconstruction technique. COMPARISON:  Head CT 11/24/2016 and MRI 07/21/2016 FINDINGS: Brain: There is no evidence of an acute infarct, intracranial hemorrhage, midline shift, or extra-axial fluid collection. A 1 cm focus of extra-axial calcification over the right parietal convexity is unchanged and may reflect a meningioma, without associated mass effect or edema. Patchy hypodensities in the cerebral white matter bilaterally have progressed and are nonspecific but compatible with mild-to-moderate chronic small vessel ischemic disease. The ventricles are normal in size. Vascular: Calcified atherosclerosis at the skull  base. No hyperdense vessel. Skull: No acute fracture or suspicious osseous lesion. Sinuses/Orbits: Visualized paranasal sinuses and mastoid air cells are clear. Unremarkable orbits. Other: None. IMPRESSION: 1. No evidence of acute intracranial abnormality. 2. Mild-to-moderate chronic small vessel ischemic disease, progressed from 2018. Electronically Signed   By: Logan Bores M.D.   On: 03/05/2022 16:44   ? ?Procedures ?Procedures  ? ? ?Medications Ordered in ED ?Medications - No data to display ? ?ED Course/ Medical Decision Making/ A&P ?  ?                        ?  Medical Decision Making ? ?Patient presents with 2 to 3-day history of several different complaints.  Had a headache.  Had some mild abdominal pain.  Had confusion.  Is on Coumadin but INR is 1.5.  Face symmetric and movements intact but finger-nose slightly off on the left and appears to have less strength on the left side compared to the right lower extremity.  With focal deficits patient is not a tPA candidate due to time of onset of a couple days ago.  Also had had a headache that is improved.  Head CT done and independently interpreted with no bleed or clear acute cause.  However potentially could be stroke versus other cause of the deficits.  Will require admission the hospital for further work-up.  Will discuss with hospitalist. ? ? ? ? ? ? ? ? ? ?Final Clinical Impression(s) / ED Diagnoses ?Final diagnoses:  ?Weakness  ? ? ?Rx / DC Orders ?ED Discharge Orders   ? ? None  ? ?  ? ? ?  ?Davonna Belling, MD ?03/05/22 1847 ? ?

## 2022-03-05 NOTE — Consult Note (Signed)
?                    NEURO HOSPITALIST CONSULT NOTE  ? ?Requestig physician: Dr. Flossie Buffy ? ?Reason for Consult: Transient spell of dysphasia ? ?History obtained from:  Patient and Chart    ? ?HPI:                                                                                                                                         ? Lauren Chung is an 56 y.o. female with a remote history of stroke approximately 20 years ago, anxiety, depression, HLD, CKD3, HTN, lupus, PE (on chronic anticoagulation with Coumadin) and PTSD who presented to the ED this afternoon with dizziness, slurred speech and dysphasia. LKN was 2217 yesterday. The patient states that she was in the middle of texting her daughter when her daughter noted the texts to be garbled. The patient could understand everything her daughter was saying, but her words were apparently garbled. Another daughter then witnessed the patient speaking and told her that he speech was not normal. The patient states she was surprised by what her daughters were telling her, as she felt that he speech output was completely intelligible. A few days ago she had a circumferential headache with photosensitivity that did not improve with NSAIDs. She endorses trouble with her memory since then, culminating in the spell of word finding difficulty.  ? ?She states that over a period of about 4-5 hours after arriving to the ED, her word-finding difficulty gradually resolved.  ? ?CT head revealed mild to moderate chronic small vessel ischemic disease, as well as a 1 cm focus of extra-axial calcification over the right parietal convexity that is unchanged from prior imaging and may represent a hemangioma. ? ?History from EDP note reviewed: "Patient presents feeling bad.  For 2 to 3 days now has had dizziness unsteadiness some confusion.  Had difficulty texting.  Difficulty finding the words.  Would text sort of nonsensical words also.  Had some abdominal pain.  Also had a severe  headache.  Headaches improved somewhat.  Abdomen is improved.  Had difficulty walking however in the ER.  Has a history of lupus and is on Coumadin for previous pulm embolisms.  Previous history of stroke or TIA years ago." ? ? ?Past Medical History:  ?Diagnosis Date  ? Anemia   ? Anxiety   ? Depression   ? History of blood transfusion   ? in [place]  ? Hyperlipidemia   ? Hypertension   ? Lupus ([place])   ? PE (pulmonary embolism)   ? Pneumonia   ? x 3 - last time 2017  ? PTSD (post-traumatic stress disorder)   ? Stroke [facility])   ? many years ago per patient, no deficits  ? SVD (spontaneous vaginal delivery)   ? x 3  ? TIA (transient ischemic attack)   ?  many years ago per patient, no deficits  ? ? ?Past Surgical History:  ?Procedure Laterality Date  ? ABDOMINAL SURGERY    ? ABLATION    ? gyn procedure for bleeding  ? cyst removed    ? right leg with sedation/anesthesia  ? EXCISION MASS HEAD N/A 08/05/2019  ? Procedure: EXCISION OF SCALP CYST;  Surgeon: Izora Gala, MD;  Location: [facility];  Service: ENT;  Laterality: N/A;  ? ? ?Family History  ?Problem Relation Age of Onset  ? Leukemia Father   ? Seizures Daughter   ? Drug abuse Mother   ? Alcohol abuse Mother   ?          ? ?Social History:  reports that she has never smoked. She has never used smokeless tobacco. She reports that she does not drink alcohol and does not use drugs. ? ?Allergies  ?Allergen Reactions  ? Codeine Shortness Of Breath  ? Contrast Media [Iodinated Contrast Media] Shortness Of Breath  ? Tylenol [Acetaminophen] Nausea And Vomiting  ? ? ?MEDICATIONS:                                                                                                                     ?No current facility-administered medications on file prior to encounter.  ? ?Current Outpatient Medications on File Prior to Encounter  ?Medication Sig Dispense Refill  ? alprazolam (XANAX) 1 MG tablet 1 qam  2 qhs (Patient taking differently: Take 1 mg by mouth at bedtime.  1 qam  2 qhs) 90 tablet 4  ? buPROPion (WELLBUTRIN XL) 150 MG 24 hr tablet 1 qam (Patient taking differently: Take 150 mg by mouth daily. 1 qam) 30 tablet 5  ? hydroxychloroquine (PLAQUENIL) 200 MG tablet Take 200 mg by mouth daily.    ? LINZESS 72 MCG capsule Take 72 mcg by mouth daily as needed (constipation).    ? metoprolol succinate (TOPROL-XL) 25 MG 24 hr tablet TAKE 1 TABLET (25 MG TOTAL) BY MOUTH IN THE MORNING. (Patient taking differently: Take 25 mg by mouth daily.) 90 tablet 1  ? rosuvastatin (CRESTOR) 10 MG tablet Take 10 mg by mouth at bedtime.     ? warfarin (COUMADIN) 2 MG tablet Take 3 mg by mouth daily.     ? zolpidem (AMBIEN CR) 12.5 MG CR tablet Take 1 tablet (12.5 mg total) by mouth at bedtime. 30 tablet 4  ? buPROPion (WELLBUTRIN XL) 300 MG 24 hr tablet Take 1 tablet (300 mg total) by mouth daily. 1  qam (Patient not taking: Reported on 03/06/2022) 304 tablet 4  ? predniSONE (DELTASONE) 5 MG tablet PLEASE SEE ATTACHED FOR DETAILED DIRECTIONS (Patient not taking: Reported on 03/06/2022)    ? ? ?Scheduled: ?  stroke: early stages of recovery book   Does not apply Once  ? Warfarin - Pharmacist Dosing Inpatient   Does not apply [id number]  ? ? ? ?ROS:                                                                                                                                       ?  Has had some midsternal chest pain. Other ROS as per HPI. Does not endorse any additional symptoms.  ? ? ?Blood pressure (!) 142/88, pulse 82, temperature 98.6 ?F (37 ?C), temperature source Oral, resp. rate 18, height '5\' 8"'$  (1.727 m), weight 84.4 kg, SpO2 99 %. ? ? ?General Examination:                                                                                                      ? ?Physical Exam  ?HEENT-  [place]/AT    ?Lungs- Respirations unlabored ?Extremities- No edema ? ? ?Neurological Examination ?Mental Status: Alert, oriented to the month, year, city and state, but identifies the day of the week as Thursday. Anxious  affect. Speech fluent with intact naming and comprehension. No dysarthria.  ?Cranial Nerves: ?II: Temporal visual fields intact with no extinction to DSS. PERRL.  ?III,IV, VI: No ptosis. EOMI. No nystagmus.  ?V: Temp sensation subjectively decreased on the right ?VII: Smile symmetric ?VIII: Hearing intact to voice  ?IX,X: No hypophonia or hoarseness ?XI: Symmetric shoulder shrug ?XII: Midline tongue extension ?Motor: ?Right : Upper extremity   5/5    Left:     Upper extremity   5/5 ? Lower extremity   5/5     Lower extremity   5/5 ?No pronator drift.  ?Sensory: Temp sensation subjectively decreased to left forearm, otherwise intact x 4. FT intact x 4 with no extinction to DSS ?Deep Tendon Reflexes: 2+ and symmetric throughout ?Plantars: Deferred at patient's request ?Cerebellar: No ataxia with FNF bilaterally  ?Gait: Deferred ?  ?Lab Results: ?Basic Metabolic Panel: ?Recent Labs  ?Lab 03/05/22 ?1554 03/05/22 ?1627  ?NA 137 140  ?K 3.6 3.7  ?CL 104 101  ?CO2 23  --   ?GLUCOSE 83 78  ?BUN 12 13  ?CREATININE 1.50* 1.50*  ?CALCIUM 8.9  --   ? ? ?CBC: ?Recent Labs  ?Lab 03/05/22 ?1554 03/05/22 ?1627  ?WBC 8.8  --   ?NEUTROABS 6.8  --   ?HGB 10.9* 12.9  ?HCT 35.8* 38.0  ?MCV 80.4  --   ?PLT 288  --   ? ? ?Cardiac Enzymes: ?No results for input(s): CKTOTAL, CKMB, CKMBINDEX, TROPONINI in the last 168 hours. ? ?Lipid Panel: ?No results for input(s): CHOL, TRIG, HDL, CHOLHDL, VLDL, LDLCALC in the last 168 hours. ? ?Imaging: ?CT HEAD WO CONTRAST ? ?Result Date: 03/05/2022 ?CLINICAL DATA:  Mental status change, unknown cause. Dizziness and speech disturbance. EXAM: CT HEAD WITHOUT CONTRAST TECHNIQUE: Contiguous axial images were obtained from the base of the skull through the vertex without intravenous contrast. RADIATION DOSE REDUCTION: This exam was performed according to the departmental dose-optimization program which includes automated exposure control, adjustment of the mA and/or kV according to patient size and/or use of  iterative reconstruction technique. COMPARISON:  Head CT 11/24/2016 and MRI 07/21/2016 FINDINGS: Brain: There is no evidence of an acute infarct, intracranial hemorrhage, midline shift, or extra-axial fluid co

## 2022-03-05 NOTE — Assessment & Plan Note (Addendum)
Hx of remote TIA with hx of lupus with PE and subtherapeutic INR today who presents with dysarthria and left-sided weakness.  Symptoms initially concerning for CVA. ?-CT head showing chronic ischemic vessel disease ?--MRI brain neg for acute infarct ?- 2d echo and carotid dopplers reviewed, unremarkable ?-Neurology following, recommendation to cont on Crestor '10mg'$  and to cont lovenox bridge with goal INR to 2-3. ?-Patient to f/u with coumadin clinic early next week for coumadin reassessment ?-Patient to f/u with Neurology in 4 weeks ?

## 2022-03-05 NOTE — ED Provider Triage Note (Signed)
emergency Medicine Provider Triage Evaluation Note ? ?[name] , a 56 y.o. female  was evaluated in triage.  Pt complains of dizziness, confusion, change in speech.  Patient's daughter brings patient to the hospital with concerns over these complaints.  Patient denies dizziness, denies confusion, denies other complaints.  Patient unable to successfully repeat simple sentence without confusion.  Patient visibly unsteady on feet when trying to transfer from wheelchair to examination room chair.  Patient is alert and oriented to place person and event but confused over some recent events.  Last known well approximately 2 days ago.  Patient lives with son who is autistic who will be unable to provide last known well time. ? ?Review of Systems  ?Positive: Speech difficulties, dizziness, confusion ?Negative: Weakness, headache, shortness of breath ? ?Physical Exam  ?LMP  (LMP Unknown)  ?Gen:   Awake, no distress   ?Resp:  Normal effort  ?MSK:   Moves extremities without difficulty  ?Other:   ? ?Medical Decision Making  ?Medically screening exam initiated at 3:42 PM.  Appropriate orders placed.  Pascha Shadle was informed that the remainder of the evaluation will be completed by another provider, this initial triage assessment does not replace that evaluation, and the importance of remaining in the ED until their evaluation is complete. ? ?Patient outside of window for possible stroke activation ?  ?Dorothyann Peng, PA-C ?03/05/22 1552 ? ?

## 2022-03-05 NOTE — Assessment & Plan Note (Addendum)
Subtherapeutic INR at presentation at 1.5.  Reports last dose on the AM of presentation ?Continue Coumadin per home dosing ?Have prescribed lovenox bridge with goal INR being 2-3 ?

## 2022-03-05 NOTE — H&P (Signed)
?History and Physical  ? ? ?Patient: Lauren Chung GMW:[id number] DOB: [birth year] ?DOA: 03/05/2022 ?DOS: the patient was seen and examined on 03/05/2022 ?PCP: Antony Contras, MD  ?Patient coming from: Home ? ?Chief Complaint:  ?Chief Complaint  ?Patient presents with  ? stroke sx  ? ?HPI: Lauren Chung is a 56 y.o. female with medical history significant of systemic lupus with history of pulmonary embolism on warfarin, hypertension, CKD stage IIIa who presents with trouble word finding.  ? ?Pt reports a circumferential headache with photosensitivity a few days ago unrelieved with NSAIDS. Since then has trouble with her memory. Then yesterday began to have word finding difficulty and daughter received a text from her last night that did not make sense.  She has also noticed a sharp pain going from her midsternal down to her stomach intermittently today.  Also felt her heart pounding with increasing shortness of breath today with ambulation.  She continued to have trouble word finding and decided to present to the ED.  She reports a remote history of TIA in the [place] with presyncopal symptoms at that time.  She is not on daily aspirin but takes Coumadin for history of PE and at last dose this morning. ? ?In the ED, she was afebrile and mildly hypertensive up to 142/88 on room air.  CBC showed no leukocytosis but had anemia of 10.9. mildly elevated creatinine 1.50 which is similar to prior.   ? ?CT head shows mild to moderate chronic small vessel ischemic disease.  There is a 1 cm focus of extra-axial calcification over the right parietal convexity that is unchanged and may represent a hemangioma without mass effect or edema. ?EKG on my review shows normal sinus rhythm with no ST or T wave abnormalities. ? ? ?Review of Systems: As mentioned in the history of present illness. All other systems reviewed and are negative. ?Past Medical History:  ?Diagnosis Date  ? Anemia   ? Anxiety   ? Depression   ? History of blood  transfusion   ? in [place]  ? Hyperlipidemia   ? Hypertension   ? Lupus ([place])   ? PE (pulmonary embolism)   ? Pneumonia   ? x 3 - last time 2017  ? PTSD (post-traumatic stress disorder)   ? Stroke [facility])   ? many years ago per patient, no deficits  ? SVD (spontaneous vaginal delivery)   ? x 3  ? TIA (transient ischemic attack)   ? many years ago per patient, no deficits  ? ?Past Surgical History:  ?Procedure Laterality Date  ? ABDOMINAL SURGERY    ? ABLATION    ? gyn procedure for bleeding  ? cyst removed    ? right leg with sedation/anesthesia  ? EXCISION MASS HEAD N/A 08/05/2019  ? Procedure: EXCISION OF SCALP CYST;  Surgeon: Izora Gala, MD;  Location: [facility];  Service: ENT;  Laterality: N/A;  ? ?Social History:  reports that she has never smoked. She has never used smokeless tobacco. She reports that she does not drink alcohol and does not use drugs. ? ?Allergies  ?Allergen Reactions  ? Codeine Shortness Of Breath  ? Contrast Media [Iodinated Contrast Media] Shortness Of Breath  ? Tylenol [Acetaminophen] Nausea And Vomiting  ? ? ?Family History  ?Problem Relation Age of Onset  ? Leukemia Father   ? Seizures Daughter   ? Drug abuse Mother   ? Alcohol abuse Mother   ? ? ?Prior to Admission medications   ?  Medication Sig Start Date End Date Taking? Authorizing Provider  ?alprazolam (XANAX) 1 MG tablet 1 qam  2 qhs 02/19/22   Plovsky, Berneta Sages, MD  ?buPROPion (WELLBUTRIN XL) 150 MG 24 hr tablet 1 qam 02/19/22   Plovsky, Berneta Sages, MD  ?buPROPion (WELLBUTRIN XL) 300 MG 24 hr tablet Take 1 tablet (300 mg total) by mouth daily. 1  qam 02/19/22   Plovsky, Berneta Sages, MD  ?metoprolol succinate (TOPROL-XL) 25 MG 24 hr tablet TAKE 1 TABLET (25 MG TOTAL) BY MOUTH IN THE MORNING. 11/18/21   Tolia, Sunit, DO  ?rosuvastatin (CRESTOR) 10 MG tablet Take 10 mg by mouth at bedtime.     [provider]  ?warfarin (COUMADIN) 2 MG tablet Take 3 mg by mouth daily.     [provider]  ?zolpidem (AMBIEN CR) 12.5 MG CR  tablet Take 1 tablet (12.5 mg total) by mouth at bedtime. 12/25/21   Norma Fredrickson, MD  ? ? ?Physical Exam: ?Vitals:  ? 03/05/22 1542 03/05/22 1543  ?BP: (!) 142/88   ?Pulse: 82   ?Resp: 18   ?Temp: 98.6 ?F (37 ?C)   ?TempSrc: Oral   ?SpO2: 99%   ?Weight:  84.4 kg  ?Height:  '5\' 8"'$  (1.727 m)  ? ?Constitutional: NAD, calm, comfortable, middle age female laying upright in bed  ?Eyes: PERRL, lids and conjunctivae normal ?ENMT: Mucous membranes are moist.  ?Neck: normal, supple ?Respiratory: clear to auscultation bilaterally, no wheezing, no crackles. Normal respiratory effort. No accessory muscle use.  ?Cardiovascular: Regular rate and rhythm, no murmurs / rubs / gallops. No extremity edema.   ?Abdomen: no tenderness, Bowel sounds positive.  ?Musculoskeletal: no clubbing / cyanosis. No joint deformity upper and lower extremities. Good ROM, no contractures. Normal muscle tone.  ?Skin: no rashes, lesions, ulcers. No induration ?Neurologic: CN 2-12 grossly intact.  No facial asymmetry.  Has trouble word finding throughout conversation.  Strength 4/5 in left upper and lower extremity.  Past finger pointing on the left. ?Psychiatric: Normal judgment and insight. Alert and oriented x 3.anxious mood. ?Data Reviewed: ? ?See HPI ? ?Assessment and Plan: ?Dysarthria ?Hx of remote TIA with hx of lupus with PE and subtherapeutic INR today who presents with dysarthria and left-sided weakness.  Symptoms highly concerning for CVA. ?-CT head showing chronic ischemic vessel disease ?--Obtain MRI brain without contrast ?- MRA head and carotid Doppler ultrasound due to contrast allergy ?-Obtain echocardiogram  ?-Obtain A1c and lipids ?-PT/OT/SLT ?-Frequent neuro checks and keep on telemetry ?-Allow for permissive hypertension with blood pressure treatment as needed only if systolic goes above 030 ?-Continue Coumadin ?-Consult neurology ? ?HTN (hypertension) ?Allow for permissive hypertension pending stroke work-up ? ?Lupus ([place]) ?Continue  home medication pending med rec ? ?History of pulmonary embolism ?Subtherapeutic INR today at 1.5.  Reports last dose this morning. ?Continue Coumadin per pharmacy ? ? ? ? ? Advance Care Planning:   Code Status: Full Code  ? ?Consults: neurology ? ?Family Communication: Daughter at bedside ? ?Severity of Illness: ?The appropriate patient status for this patient is OBSERVATION. Observation status is judged to be reasonable and necessary in order to provide the required intensity of service to ensure the patient's safety. The patient's presenting symptoms, physical exam findings, and initial radiographic and laboratory data in the context of their medical condition is felt to place them at decreased risk for further clinical deterioration. Furthermore, it is anticipated that the patient will be medically stable for discharge from the hospital within 2 midnights of admission.  ? ?  Author: Orene Desanctis, DO ?03/05/2022 8:20 PM ? ?For on call review [email or web address].  ?

## 2022-03-05 NOTE — Progress Notes (Signed)
ANTICOAGULATION CONSULT NOTE - Initial Consult ? ?Pharmacy Consult for Warfarin ?Indication: pulmonary embolus and history of lupus ? ?Allergies  ?Allergen Reactions  ? Codeine Shortness Of Breath  ? Contrast Media [Iodinated Contrast Media] Shortness Of Breath  ? Tylenol [Acetaminophen] Nausea And Vomiting  ? ? ?Patient Measurements: ?Height: '5\' 8"'$  (172.7 cm) ?Weight: 84.4 kg (186 lb 1.1 oz) ?IBW/kg (Calculated) : 63.9 ? ?Vital Signs: ?Temp: 98.6 ?F (37 ?C) (05/03 1542) ?Temp Source: Oral (05/03 1542) ?BP: 142/88 (05/03 1542) ?Pulse Rate: 82 (05/03 1542) ? ?Labs: ?Recent Labs  ?  03/05/22 ?1554 03/05/22 ?1627  ?HGB 10.9* 12.9  ?HCT 35.8* 38.0  ?PLT 288  --   ?APTT 26  --   ?LABPROT 17.5*  --   ?INR 1.5*  --   ?CREATININE 1.50* 1.50*  ? ? ?Estimated Creatinine Clearance: 48.8 mL/min (A) (by C-G formula based on SCr of 1.5 mg/dL (H)). ? ? ?Medical History: ?Past Medical History:  ?Diagnosis Date  ? Anemia   ? Anxiety   ? Depression   ? History of blood transfusion   ? in [place]  ? Hyperlipidemia   ? Hypertension   ? Lupus ([place])   ? PE (pulmonary embolism)   ? Pneumonia   ? x 3 - last time 2017  ? PTSD (post-traumatic stress disorder)   ? Stroke [facility])   ? many years ago per patient, no deficits  ? SVD (spontaneous vaginal delivery)   ? x 3  ? TIA (transient ischemic attack)   ? many years ago per patient, no deficits  ? ? ?Medications:  ?Scheduled:  ?  stroke: early stages of recovery book   Does not apply Once  ? ? ?Assessment: ?56 yo F on Warfarin for history of pulmonary embolism and lupus. PTA Warfarin dose: 3 mg PO daily. Patient reports taking her last dose this morning.  ? ?INR on admission is subtherapeutic at 1.5. Baseline CBC is ok. No signs/symptoms of bleeding noted. Discussed Lovenox bridge; Per Dr. Flossie Buffy, will continue with warfarin alone for now and wait for additional imaging and neurology consult.  ? ?Goal of Therapy:  ?INR 2-3 ?Monitor platelets by anticoagulation protocol: Yes ?  ?Plan: ?Warfarin 2 mg PO x  1 tonight.  ?Daily INR.  ?Monitor CBC and for signs/symptoms of bleeding.  ? ? ?Vance Peper, PharmD ?PGY1 Pharmacy Resident ?03/05/2022 8:24 PM  ? ?Please check AMION for all [facility] phone numbers ?After 10:00 PM, call [facility] [phone] ? ? ? ?

## 2022-03-06 ENCOUNTER — Other Ambulatory Visit (HOSPITAL_COMMUNITY): Payer: Self-pay

## 2022-03-06 ENCOUNTER — Observation Stay (HOSPITAL_BASED_OUTPATIENT_CLINIC_OR_DEPARTMENT_OTHER): Payer: Medicare Other

## 2022-03-06 DIAGNOSIS — Z7901 Long term (current) use of anticoagulants: Secondary | ICD-10-CM | POA: Diagnosis not present

## 2022-03-06 DIAGNOSIS — I10 Essential (primary) hypertension: Secondary | ICD-10-CM

## 2022-03-06 DIAGNOSIS — M329 Systemic lupus erythematosus, unspecified: Secondary | ICD-10-CM | POA: Diagnosis not present

## 2022-03-06 DIAGNOSIS — I69922 Dysarthria following unspecified cerebrovascular disease: Secondary | ICD-10-CM

## 2022-03-06 DIAGNOSIS — G459 Transient cerebral ischemic attack, unspecified: Secondary | ICD-10-CM | POA: Diagnosis not present

## 2022-03-06 DIAGNOSIS — R531 Weakness: Secondary | ICD-10-CM | POA: Diagnosis not present

## 2022-03-06 DIAGNOSIS — Z86711 Personal history of pulmonary embolism: Secondary | ICD-10-CM | POA: Diagnosis not present

## 2022-03-06 LAB — LIPID PANEL
Cholesterol: 109 mg/dL (ref 0–200)
HDL: 42 mg/dL (ref >40)
LDL Cholesterol: 52 mg/dL (ref 0–99)
Total CHOL/HDL Ratio: 2.6 RATIO
Triglycerides: 75 mg/dL (ref <150)
VLDL: 15 mg/dL (ref 0–40)

## 2022-03-06 LAB — ECHOCARDIOGRAM COMPLETE
AR max vel: 2.47 cm2
AV Area VTI: 2.23 cm2
AV Area mean vel: 2.27 cm2
AV Mean grad: 4 mmHg
AV Peak grad: 6.6 mmHg
Ao pk vel: 1.28 m/s
Area-P 1/2: 3.27 cm2
Calc EF: 63.4 %
Height: 68 in
S' Lateral: 2.71 cm
Single Plane A2C EF: 69.9 %
Single Plane A4C EF: 50.2 %
Weight: 2977.09 oz

## 2022-03-06 LAB — CBC
HCT: 34.3 % — ABNORMAL LOW (ref 36.0–46.0)
Hemoglobin: 10.3 g/dL — ABNORMAL LOW (ref 12.0–15.0)
MCH: 24.3 pg — ABNORMAL LOW (ref 26.0–34.0)
MCHC: 30 g/dL (ref 30.0–36.0)
MCV: 80.9 fL (ref 80.0–100.0)
Platelets: 276 10*3/uL (ref 150–400)
RBC: 4.24 MIL/uL (ref 3.87–5.11)
RDW: 17.4 % — ABNORMAL HIGH (ref 11.5–15.5)
WBC: 7.4 10*3/uL (ref 4.0–10.5)
nRBC: 0 % (ref 0.0–0.2)

## 2022-03-06 LAB — PROTIME-INR
INR: 1.4 — ABNORMAL HIGH (ref 0.8–1.2)
Prothrombin Time: 17.2 seconds — ABNORMAL HIGH (ref 11.4–15.2)

## 2022-03-06 MED ORDER — METOCLOPRAMIDE HCL 5 MG/ML IJ SOLN
5.0000 mg | Freq: Once | INTRAMUSCULAR | Status: AC
  Administered 2022-03-06: 5 mg via INTRAVENOUS
  Filled 2022-03-06: qty 2

## 2022-03-06 MED ORDER — ROSUVASTATIN CALCIUM 5 MG PO TABS
10.0000 mg | ORAL_TABLET | Freq: Every day | ORAL | Status: DC
  Administered 2022-03-06: 10 mg via ORAL
  Filled 2022-03-06: qty 2

## 2022-03-06 MED ORDER — WARFARIN SODIUM 5 MG PO TABS
5.0000 mg | ORAL_TABLET | Freq: Once | ORAL | Status: AC
  Administered 2022-03-06: 5 mg via ORAL
  Filled 2022-03-06 (×2): qty 1

## 2022-03-06 MED ORDER — ALPRAZOLAM 0.25 MG PO TABS
1.0000 mg | ORAL_TABLET | Freq: Every day | ORAL | Status: DC
  Administered 2022-03-06: 1 mg via ORAL
  Filled 2022-03-06: qty 4

## 2022-03-06 MED ORDER — HYDROXYCHLOROQUINE SULFATE 200 MG PO TABS
200.0000 mg | ORAL_TABLET | Freq: Every day | ORAL | Status: DC
  Administered 2022-03-06: 200 mg via ORAL
  Filled 2022-03-06: qty 1

## 2022-03-06 MED ORDER — KETOROLAC TROMETHAMINE 30 MG/ML IJ SOLN
30.0000 mg | Freq: Once | INTRAMUSCULAR | Status: AC
  Administered 2022-03-06: 30 mg via INTRAVENOUS
  Filled 2022-03-06: qty 1

## 2022-03-06 MED ORDER — ENOXAPARIN SODIUM 80 MG/0.8ML IJ SOSY
80.0000 mg | PREFILLED_SYRINGE | Freq: Two times a day (BID) | INTRAMUSCULAR | Status: DC
  Administered 2022-03-06: 80 mg via SUBCUTANEOUS
  Filled 2022-03-06 (×2): qty 0.8

## 2022-03-06 MED ORDER — DIPHENHYDRAMINE HCL 50 MG/ML IJ SOLN
12.5000 mg | Freq: Once | INTRAMUSCULAR | Status: AC
  Administered 2022-03-06: 12.5 mg via INTRAVENOUS
  Filled 2022-03-06: qty 1

## 2022-03-06 MED ORDER — BUPROPION HCL ER (XL) 150 MG PO TB24
150.0000 mg | ORAL_TABLET | Freq: Every day | ORAL | Status: DC
Start: 2022-03-06 — End: 2022-03-06
  Administered 2022-03-06: 150 mg via ORAL
  Filled 2022-03-06: qty 1

## 2022-03-06 MED ORDER — ENOXAPARIN SODIUM 80 MG/0.8ML IJ SOSY
1.0000 mg/kg | PREFILLED_SYRINGE | Freq: Two times a day (BID) | INTRAMUSCULAR | 0 refills | Status: DC
  Filled 2022-03-06: qty 8.5, 5d supply, fill #0

## 2022-03-06 MED ORDER — ENOXAPARIN SODIUM 80 MG/0.8ML IJ SOSY
80.0000 mg | PREFILLED_SYRINGE | Freq: Two times a day (BID) | INTRAMUSCULAR | 0 refills | Status: DC
  Filled 2022-03-06: qty 8, 5d supply, fill #0

## 2022-03-06 NOTE — Discharge Summary (Signed)
?Physician Discharge Summary ?  ?Patient: Lauren Chung MRN: [id number] DOB: August 03, 1966  ?Admit date:     03/05/2022  ?Discharge date: 03/06/22  ?Discharge Physician: Marylu Lund  ? ?PCP: Antony Contras, MD  ? ?Recommendations at discharge:  ? ? Follow up with PCP in 1-2 weeks ?Advised pt to have coumadin level checked 5/8 or 5/9, goal INR 2-3 ?Follow up with Rheumatologist as scheduled ?Follow up with Neurology as scheduled ?Please follow up on ANA, Cardiolipin ab, homocysteine, lupus anticoagulant labs that were drawn 5/4 ? ?Discharge Diagnoses: ?Principal Problem: ?  Left-sided weakness ?Active Problems: ?  Dysarthria ?  History of pulmonary embolism ?  Lupus ([place]) ?  HTN (hypertension) ? ?Resolved Problems: ?  * No resolved hospital problems. * ? ?Hospital Course: ?56 y.o. female with medical history significant of systemic lupus with history of pulmonary embolism on warfarin, hypertension, CKD stage IIIa who presents with trouble word finding.  ?  ?Pt reports a circumferential headache with photosensitivity a few days ago unrelieved with NSAIDS ? ?Assessment and Plan: ?Dysarthria ?Hx of remote TIA with hx of lupus with PE and subtherapeutic INR today who presents with dysarthria and left-sided weakness.  Symptoms initially concerning for CVA. ?-CT head showing chronic ischemic vessel disease ?--MRI brain neg for acute infarct ?- 2d echo and carotid dopplers reviewed, unremarkable ?-Neurology following, recommendation to cont on Crestor '10mg'$  and to cont lovenox bridge with goal INR to 2-3. ?-Patient to f/u with coumadin clinic early next week for coumadin reassessment ?-Patient to f/u with Neurology in 4 weeks ? ?HTN (hypertension) ?Remained stable ?Pt to cont home meds on d/c ? ?Lupus ([place]) ?Would resume home meds on d/c ?Advised pt to f/u closely with primary Rheumatologist ?Please follow up on ANA, Cardiolipin ab, homocysteine, lupus anticoagulant labs that were drawn 5/4 ? ?History of pulmonary  embolism ?Subtherapeutic INR at presentation at 1.5.  Reports last dose on the AM of presentation ?Continue Coumadin per home dosing ?Have prescribed lovenox bridge with goal INR being 2-3 ? ? ?  ? ? ?Consultants: Neurology ?Procedures performed:   ?Disposition: Home ?Diet recommendation:  ?Cardiac diet ?DISCHARGE MEDICATION: ?Allergies as of 03/06/2022   ? ?   Reactions  ? Codeine Shortness Of Breath  ? Contrast Media [iodinated Contrast Media] Shortness Of Breath  ? Tylenol [acetaminophen] Nausea And Vomiting  ? ?  ? ?  ?Medication List  ?  ? ?TAKE these medications   ? ?ALPRAZolam 1 MG tablet ?Commonly known as: XANAX ?1 qam  2 qhs ?What changed:  ?how much to take ?how to take this ?when to take this ?  ?buPROPion 300 MG 24 hr tablet ?Commonly known as: Wellbutrin XL ?Take 1 tablet (300 mg total) by mouth daily. 1  qam ?What changed: Another medication with the same name was removed. Continue taking this medication, and follow the directions you see here. ?  ?enoxaparin 80 MG/0.8ML injection ?Commonly known as: LOVENOX ?Inject 0.8 mLs (80 mg total) into the skin every 12 (twelve) hours for 5 days. ?  ?hydroxychloroquine 200 MG tablet ?Commonly known as: PLAQUENIL ?Take 200 mg by mouth daily. ?  ?Linzess 72 MCG capsule ?Generic drug: linaclotide ?Take 72 mcg by mouth daily as needed (constipation). ?  ?metoprolol succinate 25 MG 24 hr tablet ?Commonly known as: TOPROL-XL ?TAKE 1 TABLET (25 MG TOTAL) BY MOUTH IN THE MORNING. ?What changed: See the new instructions. ?  ?predniSONE 5 MG tablet ?Commonly known as: DELTASONE ?PLEASE SEE ATTACHED FOR DETAILED DIRECTIONS ?  ?  rosuvastatin 10 MG tablet ?Commonly known as: CRESTOR ?Take 10 mg by mouth at bedtime. ?  ?warfarin 2 MG tablet ?Commonly known as: COUMADIN ?Take 3 mg by mouth daily. ?  ?zolpidem 12.5 MG CR tablet ?Commonly known as: Ambien CR ?Take 1 tablet (12.5 mg total) by mouth at bedtime. ?  ? ?  ? ? Follow-up Information   ? ? Antony Contras, MD Follow up in 1  week(s).   ?Specialty: Family Medicine ?Why: Hospital follow up ?Contact information: ?[address] ?[address] ?[place] [address] ?[phone] ? ? ?  ?  ? ? Lahoma Rocker, MD Follow up in 2 week(s).   ?Specialty: Rheumatology ?Why: Hospital follow up ?Contact information: ?[address] ?[address] ?[place] [address] ?[phone] ? ? ?  ?  ? ? Recommend repeat INR with your PCP on 5/8 or 5/9 Follow up.   ? ?  ?  ? ? Pieter Partridge, DO. Schedule an appointment as soon as possible for a visit in 1 month(s).   ?Specialty: Neurology ?Contact information: ?[address] ?[address] ?[place] [address] ?[phone] ? ? ?  ?  ? ?  ?  ? ?  ? ?Discharge Exam: ?Danley Danker Weights  ? 03/05/22 1543  ?Weight: 84.4 kg  ? ?General exam: Awake, laying in bed, in nad ?Respiratory system: Normal respiratory effort, no wheezing ?Cardiovascular system: regular rate, s1, s2 ?Gastrointestinal system: Soft, nondistended, positive BS ?Central nervous system: CN2-12 grossly intact, strength intact ?Extremities: Perfused, no clubbing ?Skin: Normal skin turgor, no notable skin lesions seen ?Psychiatry: Mood normal // no visual hallucinations  ? ?Condition at discharge: fair ? ?The results of significant diagnostics from this hospitalization (including imaging, microbiology, ancillary and laboratory) are listed below for reference.  ? ?Imaging Studies: ?CT HEAD WO CONTRAST ? ?Result Date: 03/05/2022 ?CLINICAL DATA:  Mental status change, unknown cause. Dizziness and speech disturbance. EXAM: CT HEAD WITHOUT CONTRAST TECHNIQUE: Contiguous axial images were obtained from the base of the skull through the vertex without intravenous contrast. RADIATION DOSE REDUCTION: This exam was performed according to the departmental dose-optimization program which includes automated exposure control, adjustment of the mA and/or kV according to patient size and/or use of iterative reconstruction technique. COMPARISON:  Head CT 11/24/2016  and MRI 07/21/2016 FINDINGS: Brain: There is no evidence of an acute infarct, intracranial hemorrhage, midline shift, or extra-axial fluid collection. A 1 cm focus of extra-axial calcification over the right parietal convexity is unchanged and may reflect a meningioma, without associated mass effect or edema. Patchy hypodensities in the cerebral white matter bilaterally have progressed and are nonspecific but compatible with mild-to-moderate chronic small vessel ischemic disease. The ventricles are normal in size. Vascular: Calcified atherosclerosis at the skull base. No hyperdense vessel. Skull: No acute fracture or suspicious osseous lesion. Sinuses/Orbits: Visualized paranasal sinuses and mastoid air cells are clear. Unremarkable orbits. Other: None. IMPRESSION: 1. No evidence of acute intracranial abnormality. 2. Mild-to-moderate chronic small vessel ischemic disease, progressed from 2018. Electronically Signed   By: Logan Bores M.D.   On: 03/05/2022 16:44  ? ?MR ANGIO HEAD WO CONTRAST ? ?Result Date: 03/05/2022 ?CLINICAL DATA:  Acute neurologic deficit EXAM: MRI HEAD WITHOUT CONTRAST MRA HEAD WITHOUT CONTRAST TECHNIQUE: Multiplanar, multi-echo pulse sequences of the brain and surrounding structures were acquired without intravenous contrast. Angiographic images of the Circle of Willis were acquired using MRA technique without intravenous contrast. COMPARISON:  None Available. FINDINGS: MRI HEAD FINDINGS Brain: No acute infarct, mass effect  or extra-axial collection. No acute or chronic hemorrhage. Marked progression of bilateral supratentorial white matter disease, most commonly indicating chronic microvascular ischemia. Mild volume loss. The midline structures are normal. Vascular: Major flow voids are preserved. Skull and upper cervical spine: Normal calvarium and skull base. Visualized upper cervical spine and soft tissues are normal. Sinuses/Orbits:No paranasal sinus fluid levels or advanced mucosal  thickening. No mastoid or middle ear effusion. Normal orbits. MRA HEAD FINDINGS POSTERIOR CIRCULATION: --Vertebral arteries: Normal --Inferior cerebellar arteries: Normal. --Basilar artery: Normal. --Superior cerebellar art

## 2022-03-06 NOTE — Evaluation (Signed)
Physical Therapy Evaluation and Discharge ?Patient Details ?Name: Lauren Chung ?MRN: [id number] ?DOB: 12-Jan-1966 ?Today's Date: 03/06/2022 ? ?History of Present Illness ? Pt is a 56 y/o female admitted for L weakness, speech difficulty, and dizziness. MRI negative. PMH includes lupus, HTN, PE, and PTSD.  ?Clinical Impression ? Patient evaluated by Physical Therapy with no further acute PT needs identified. All education has been completed and the patient has no further questions. Pt overall at a mod I level with mobility tasks. Able to perform DGI tasks with no LOB. Educated about "BE FAST" acronym in recognizing CVA symptoms. See below for any follow-up Physical Therapy or equipment needs. PT is signing off. Thank you for this referral. If needs change, please re-consult.  ?   ?   ? ?Recommendations for follow up therapy are one component of a multi-disciplinary discharge planning process, led by the attending physician.  Recommendations may be updated based on patient status, additional functional criteria and insurance authorization. ? ?Follow Up Recommendations No PT follow up ? ?  ?Assistance Recommended at Discharge PRN  ?Patient can return home with the following ?   ? ?  ?Equipment Recommendations None recommended by PT  ?Recommendations for Other Services ?    ?  ?Functional Status Assessment Patient has had a recent decline in their functional status and demonstrates the ability to make significant improvements in function in a reasonable and predictable amount of time.  ? ?  ?Precautions / Restrictions Precautions ?Precautions: None ?Restrictions ?Weight Bearing Restrictions: No  ? ?  ? ?Mobility ? Bed Mobility ?Overal bed mobility: Modified Independent ?  ?  ?  ?  ?  ?  ?  ?  ? ?Transfers ?Overall transfer level: Modified independent ?  ?  ?  ?  ?  ?  ?  ?  ?  ?  ? ?Ambulation/Gait ?Ambulation/Gait assistance: Modified independent (Device/Increase time) ?Gait Distance (Feet): 150 Feet ?Assistive device:  None ?Gait Pattern/deviations: WFL(Within Functional Limits) ?Gait velocity: mildly decreased ?  ?  ?General Gait Details: Mildly slower gait speed. No overt LOB noted when performing DGI tasks. ? ?Stairs ?  ?  ?  ?  ?  ? ?Wheelchair Mobility ?  ? ?Modified Rankin (Stroke Patients Only) ?  ? ?  ? ?Balance Overall balance assessment: Modified Independent ?  ?  ?  ?  ?  ?  ?  ?  ?  ?  ?  ?  ?  ?  ?  ?Standardized Balance Assessment ?Standardized Balance Assessment : Dynamic Gait Index ?  ?Dynamic Gait Index ?Level Surface: Mild Impairment ?Change in Gait Speed: Normal ?Gait with Horizontal Head Turns: Normal ?Gait with Vertical Head Turns: Normal ?Gait and Pivot Turn: Normal ?Step Over Obstacle: Normal ?Step Around Obstacles: Normal ?   ? ? ? ?Pertinent Vitals/Pain Pain Assessment ?Pain Assessment: No/denies pain  ? ? ?Home Living Family/patient expects to be discharged to:: Private residence ?Living Arrangements: Children ?  ?Type of Home: House ?Home Access: Level entry ?  ?  ?Alternate Level Stairs-Number of Steps: flight ?Home Layout: Two level ?Home Equipment: None ?Additional Comments: Has an autistic son for whom she cares for.  ?  ?Prior Function Prior Level of Function : Independent/Modified Independent ?  ?  ?  ?  ?  ?  ?  ?  ?  ? ? ?Hand Dominance  ?   ? ?  ?Extremity/Trunk Assessment  ? Upper Extremity Assessment ?Upper Extremity Assessment: Overall WFL for tasks assessed ?  ? ?  Lower Extremity Assessment ?Lower Extremity Assessment: Generalized weakness ?  ? ?Cervical / Trunk Assessment ?Cervical / Trunk Assessment: Normal  ?Communication  ? Communication: No difficulties  ?Cognition Arousal/Alertness: Awake/alert ?Behavior During Therapy: [facility] for tasks assessed/performed ?Overall Cognitive Status: Within Functional Limits for tasks assessed ?  ?  ?  ?  ?  ?  ?  ?  ?  ?  ?  ?  ?  ?  ?  ?  ?  ?  ?  ? ?  ?General Comments General comments (skin integrity, edema, etc.): Educated about "BE FAST" acronym in  recognizing CVA symptoms ? ?  ?Exercises    ? ?Assessment/Plan  ?  ?PT Assessment Patient does not need any further PT services  ?PT Problem List   ? ?   ?  ?PT Treatment Interventions     ? ?PT Goals (Current goals can be found in the Care Plan section)  ?Acute Rehab PT Goals ?Patient Stated Goal: to go home ?PT Goal Formulation: With patient ?Time For Goal Achievement: 03/06/22 ?Potential to Achieve Goals: Good ? ?  ?Frequency   ?  ? ? ?Co-evaluation   ?  ?  ?  ?  ? ? ?  ?AM-PAC PT "6 Clicks" Mobility  ?Outcome Measure Help needed turning from your back to your side while in a flat bed without using bedrails?: None ?Help needed moving from lying on your back to sitting on the side of a flat bed without using bedrails?: None ?Help needed moving to and from a bed to a chair (including a wheelchair)?: None ?Help needed standing up from a chair using your arms (e.g., wheelchair or bedside chair)?: None ?Help needed to walk in hospital room?: None ?Help needed climbing 3-5 steps with a railing? : None ?6 Click Score: 24 ? ?  ?End of Session Equipment Utilized During Treatment: Gait belt ?Activity Tolerance: Patient tolerated treatment well ?Patient left: in bed;with call bell/phone within reach (on stretcher in ED) ?Nurse Communication: Mobility status ?PT Visit Diagnosis: Other symptoms and signs involving the nervous system (R29.898) ?  ? ?Time: 6010-9323 ?PT Time Calculation (min) (ACUTE ONLY): 12 min ? ? ?Charges:   PT Evaluation ?$PT Eval Low Complexity: 1 Low ?  ?  ?   ? ? ?Reuel Derby, PT, DPT  ?Acute Rehabilitation Services  ?Pager: [phone] ?Office: [phone] ? ? ?[place] ?03/06/2022, 12:18 PM ?

## 2022-03-06 NOTE — Progress Notes (Signed)
ANTICOAGULATION CONSULT NOTE  ? ?Pharmacy Consult for Warfarin ?Indication: pulmonary embolus and history of lupus ? ?Allergies  ?Allergen Reactions  ? Codeine Shortness Of Breath  ? Contrast Media [Iodinated Contrast Media] Shortness Of Breath  ? Tylenol [Acetaminophen] Nausea And Vomiting  ? ? ?Patient Measurements: ?Height: '5\' 8"'$  (172.7 cm) ?Weight: 84.4 kg (186 lb 1.1 oz) ?IBW/kg (Calculated) : 63.9 ? ?Vital Signs: ?Temp: 98.4 ?F (36.9 ?C) (05/04 4193) ?BP: 98/74 (05/04 0700) ?Pulse Rate: 75 (05/04 0700) ? ?Labs: ?Recent Labs  ?  03/05/22 ?1554 03/05/22 ?1627 03/06/22 ?0402  ?HGB 10.9* 12.9  --   ?HCT 35.8* 38.0  --   ?PLT 288  --   --   ?APTT 26  --   --   ?LABPROT 17.5*  --  17.2*  ?INR 1.5*  --  1.4*  ?CREATININE 1.50* 1.50*  --   ? ? ? ?Estimated Creatinine Clearance: 48.8 mL/min (A) (by C-G formula based on SCr of 1.5 mg/dL (H)). ? ? ?Medical History: ?Past Medical History:  ?Diagnosis Date  ? Anemia   ? Anxiety   ? Depression   ? History of blood transfusion   ? in [place]  ? Hyperlipidemia   ? Hypertension   ? Lupus ([place])   ? PE (pulmonary embolism)   ? Pneumonia   ? x 3 - last time 2017  ? PTSD (post-traumatic stress disorder)   ? Stroke [facility])   ? many years ago per patient, no deficits  ? SVD (spontaneous vaginal delivery)   ? x 3  ? TIA (transient ischemic attack)   ? many years ago per patient, no deficits  ? ? ?Medications:  ?Scheduled:  ?  stroke: early stages of recovery book   Does not apply Once  ? ALPRAZolam  1 mg Oral QHS  ? buPROPion  150 mg Oral Daily  ? diphenhydrAMINE  12.5 mg Intravenous Once  ? hydroxychloroquine  200 mg Oral Daily  ? ketorolac  30 mg Intravenous Once  ? metoCLOPramide (REGLAN) injection  5 mg Intravenous Once  ? rosuvastatin  10 mg Oral Daily  ? Warfarin - Pharmacist Dosing Inpatient   Does not apply [id number]  ? ? ?Assessment: ?56 yo F on Warfarin for history of pulmonary embolism and lupus. PTA Warfarin dose: 3 mg PO daily. Patient reports taking her last dose this morning.   ? ?INR this AM subtherapeutic at 1.4, CBC wnl ? ?Goal of Therapy:  ?INR 2-3 ?Monitor platelets by anticoagulation protocol: Yes ?  ?Plan: ?Warfarin '5mg'$  PO x 1 today ?Daily INR, s/s bleeding ? ?Bertis Ruddy, PharmD ?Clinical Pharmacist ?ED Pharmacist Phone # [phone] ?03/06/2022 8:33 AM ? ? ? ? ?

## 2022-03-06 NOTE — TOC Benefit Eligibility Note (Signed)
Patient Advocate Encounter ? ?Insurance verification completed.   ? ?The patient is currently admitted and upon discharge could be taking enoxaparin (Lovenox) 0.'8mg'$ /0.8 ml). ? ?The current 30 day co-pay is, $10.35.  ? ?The patient is insured through [place] Medicare Part D  ? ?. ? ?Lyndel Safe, CPhT ?Pharmacy Patient Advocate Specialist ?[place] Patient Advocate Team ?Direct Number: [phone]  Fax: [phone] ? ? ? ? ? ?  ?

## 2022-03-06 NOTE — Progress Notes (Signed)
Carotid duplex has been completed.  ? ?Preliminary results in CV Proc.  ? ?Lauren Chung [name] ?03/06/2022 10:27 AM    ?

## 2022-03-06 NOTE — ED Notes (Signed)
Pt transported to Vascular for ultrasound.  ?

## 2022-03-06 NOTE — ED Notes (Signed)
This RN messaged pharmacy regarding the need for the patient's medication to be tubed to the yellow pod in the ED ?

## 2022-03-06 NOTE — Care Management Obs Status (Signed)
MEDICARE OBSERVATION STATUS NOTIFICATION ? ? ?Patient Details  ?Name: Lauren Chung ?MRN: [id number] ?Date of Birth: 02/21/1966 ? ? ?Medicare Observation Status Notification Given:  Yes ? ? ? Laurena Slimmer, RN ?03/06/2022, 9:11 PM ?

## 2022-03-06 NOTE — Progress Notes (Addendum)
ANTICOAGULATION CONSULT NOTE - Follow Up Consult ? ?Pharmacy Consult for Warfarin and Lovenox ?Indication: pulmonary embolus and history of lupus ? ?Allergies  ?Allergen Reactions  ? Codeine Shortness Of Breath  ? Contrast Media [Iodinated Contrast Media] Shortness Of Breath  ? Tylenol [Acetaminophen] Nausea And Vomiting  ? ? ?Patient Measurements: ?Height: '5\' 8"'$  (172.7 cm) ?Weight: 84.4 kg (186 lb 1.1 oz) ?IBW/kg (Calculated) : 63.9 ? ?Vital Signs: ?Temp: 98.4 ?F (36.9 ?C) (05/04 3086) ?BP: 98/84 (05/04 1400) ?Pulse Rate: 78 (05/04 1400) ? ?Labs: ?Recent Labs  ?  03/05/22 ?1554 03/05/22 ?1627 03/06/22 ?0402  ?HGB 10.9* 12.9  --   ?HCT 35.8* 38.0  --   ?PLT 288  --   --   ?APTT 26  --   --   ?LABPROT 17.5*  --  17.2*  ?INR 1.5*  --  1.4*  ?CREATININE 1.50* 1.50*  --   ? ? ?Estimated Creatinine Clearance: 48.8 mL/min (A) (by C-G formula based on SCr of 1.5 mg/dL (H)). ? ? ?Medications:  ?Scheduled:  ?  stroke: early stages of recovery book   Does not apply Once  ? ALPRAZolam  1 mg Oral QHS  ? buPROPion  150 mg Oral Daily  ? hydroxychloroquine  200 mg Oral Daily  ? rosuvastatin  10 mg Oral Daily  ? warfarin  5 mg Oral ONCE-1600  ? Warfarin - Pharmacist Dosing Inpatient   Does not apply [id number]  ? ? ?Assessment: ?56 yo F on Warfarin for history of pulmonary embolism and lupus. PTA Warfarin dose: 3 mg PO daily. Patient reports taking her last dose on 5/3 AM. Pharmacy consulted for Warfarin dosing with Lovenox bridge until INR therapeutic between 2-3.  ? ?INR subtherapeutic at 1.4 this morning. CBC ok. No signs/symptoms of bleeding noted. ? ?Goal of Therapy:  ?INR 2-3 ?Monitor platelets by anticoagulation protocol: Yes ?  ?Plan:  ?Warfarin 5 mg PO x 1 tonight ?Lovenox 80 mg SQ q12h ?Daily INR  ?Monitor CBC and for signs/symptoms of bleeding  ?If discharging, would recommend resuming home Warfarin regimen (3 mg daily) with Lovenox bridge 80 mg SQ q12h, and INR check early next week  ? ? ?Vance Peper, PharmD ?PGY1 Pharmacy  Resident ?03/06/2022 2:19 PM  ? ?Please check AMION for all [place] phone numbers ?After 10:00 PM, call [place] [phone] ? ? ? ?

## 2022-03-06 NOTE — Progress Notes (Addendum)
STROKE TEAM PROGRESS NOTE  ? ?SUBJECTIVE (INTERVAL HISTORY) ?No family is at the bedside.  Overall her condition is completely resolved.  Patient does have episode of dizziness, word finding difficulty, blurry vision confusion, but now resolved.  CT, MRI/MRA negative.  However INR 1.4-1.5, subtherapeutic.  Patient stated that she had last INR checked 3 weeks ago which was low and her Coumadin dose was adjusted.  She also has lupus, on Plaquenil.  She stopped steroids by herself, I recommended her to discuss with her rheumatologist. ? ? ?OBJECTIVE ?Temp:  [98.4 ?F (36.9 ?C)] 98.4 ?F (36.9 ?C) (05/04 1751) ?Pulse Rate:  [72-99] 72 (05/04 1500) ?Resp:  [16-27] 25 (05/04 1500) ?BP: (91-124)/(56-86) 98/84 (05/04 1400) ?SpO2:  [96 %-100 %] 100 % (05/04 1500) ? ?No results for input(s): GLUCAP in the last 168 hours. ?Recent Labs  ?Lab 03/05/22 ?1554 03/05/22 ?1627  ?NA 137 140  ?K 3.6 3.7  ?CL 104 101  ?CO2 23  --   ?GLUCOSE 83 78  ?BUN 12 13  ?CREATININE 1.50* 1.50*  ?CALCIUM 8.9  --   ? ?Recent Labs  ?Lab 03/05/22 ?1554  ?AST 36  ?ALT 26  ?ALKPHOS 66  ?BILITOT 0.6  ?PROT 7.1  ?ALBUMIN 3.9  ? ?Recent Labs  ?Lab 03/05/22 ?1554 03/05/22 ?1627 03/06/22 ?1440  ?WBC 8.8  --  7.4  ?NEUTROABS 6.8  --   --   ?HGB 10.9* 12.9 10.3*  ?HCT 35.8* 38.0 34.3*  ?MCV 80.4  --  80.9  ?PLT 288  --  276  ? ?No results for input(s): CKTOTAL, CKMB, CKMBINDEX, TROPONINI in the last 168 hours. ?Recent Labs  ?  03/05/22 ?1554 03/06/22 ?0402  ?LABPROT 17.5* 17.2*  ?INR 1.5* 1.4*  ? ?Recent Labs  ?  03/05/22 ?2115  ?COLORURINE STRAW*  ?LABSPEC 1.005  ?PHURINE 8.0  ?GLUCOSEU NEGATIVE  ?HGBUR NEGATIVE  ?BILIRUBINUR NEGATIVE  ?KETONESUR 5*  ?PROTEINUR NEGATIVE  ?NITRITE NEGATIVE  ?LEUKOCYTESUR SMALL*  ?  ?   ?Component Value Date/Time  ? CHOL 109 03/06/2022 0402  ? TRIG 75 03/06/2022 0402  ? HDL 42 03/06/2022 0402  ? CHOLHDL 2.6 03/06/2022 0402  ? VLDL 15 03/06/2022 0402  ? LDLCALC 52 03/06/2022 0402  ? ?Lab Results  ?Component Value Date  ? HGBA1C  5.0 03/05/2022  ? ?   ?Component Value Date/Time  ? [place] DETECTED 03/05/2022 2117  ? [place] DETECTED 03/05/2022 2117  ? LABBENZ POSITIVE (A) 03/05/2022 2117  ? AMPHETMU NONE DETECTED 03/05/2022 2117  ? [place] DETECTED 03/05/2022 2117  ? [place] DETECTED 03/05/2022 2117  ?  ?Recent Labs  ?Lab 03/05/22 ?1554  ?ETH <10  ? ? ?I have personally reviewed the radiological images below and agree with the radiology interpretations. ? ?CT HEAD WO CONTRAST ? ?Result Date: 03/05/2022 ?CLINICAL DATA:  Mental status change, unknown cause. Dizziness and speech disturbance. EXAM: CT HEAD WITHOUT CONTRAST TECHNIQUE: Contiguous axial images were obtained from the base of the skull through the vertex without intravenous contrast. RADIATION DOSE REDUCTION: This exam was performed according to the departmental dose-optimization program which includes automated exposure control, adjustment of the mA and/or kV according to patient size and/or use of iterative reconstruction technique. COMPARISON:  Head CT 11/24/2016 and MRI 07/21/2016 FINDINGS: Brain: There is no evidence of an acute infarct, intracranial hemorrhage, midline shift, or extra-axial fluid collection. A 1 cm focus of extra-axial calcification over the right parietal convexity is unchanged and may reflect a meningioma, without associated mass effect  or edema. Patchy hypodensities in the cerebral white matter bilaterally have progressed and are nonspecific but compatible with mild-to-moderate chronic small vessel ischemic disease. The ventricles are normal in size. Vascular: Calcified atherosclerosis at the skull base. No hyperdense vessel. Skull: No acute fracture or suspicious osseous lesion. Sinuses/Orbits: Visualized paranasal sinuses and mastoid air cells are clear. Unremarkable orbits. Other: None. IMPRESSION: 1. No evidence of acute intracranial abnormality. 2. Mild-to-moderate chronic small vessel ischemic disease, progressed from 2018.  Electronically Signed   By: Logan Bores M.D.   On: 03/05/2022 16:44  ? ?MR ANGIO HEAD WO CONTRAST ? ?Result Date: 03/05/2022 ?CLINICAL DATA:  Acute neurologic deficit EXAM: MRI HEAD WITHOUT CONTRAST MRA HEAD WITHOUT CONTRAST TECHNIQUE: Multiplanar, multi-echo pulse sequences of the brain and surrounding structures were acquired without intravenous contrast. Angiographic images of the Circle of Willis were acquired using MRA technique without intravenous contrast. COMPARISON:  None Available. FINDINGS: MRI HEAD FINDINGS Brain: No acute infarct, mass effect or extra-axial collection. No acute or chronic hemorrhage. Marked progression of bilateral supratentorial white matter disease, most commonly indicating chronic microvascular ischemia. Mild volume loss. The midline structures are normal. Vascular: Major flow voids are preserved. Skull and upper cervical spine: Normal calvarium and skull base. Visualized upper cervical spine and soft tissues are normal. Sinuses/Orbits:No paranasal sinus fluid levels or advanced mucosal thickening. No mastoid or middle ear effusion. Normal orbits. MRA HEAD FINDINGS POSTERIOR CIRCULATION: --Vertebral arteries: Normal --Inferior cerebellar arteries: Normal. --Basilar artery: Normal. --Superior cerebellar arteries: Normal. --Posterior cerebral arteries: Normal. ANTERIOR CIRCULATION: --Intracranial internal carotid arteries: Normal. --Anterior cerebral arteries (ACA): Normal. --Middle cerebral arteries (MCA): Normal. ANATOMIC VARIANTS: None IMPRESSION: 1. No acute intracranial abnormality. 2. Marked progression of bilateral supratentorial white matter disease, most commonly indicating chronic microvascular ischemia. 3. Normal intracranial MRA. Electronically Signed   By: Ulyses Jarred M.D.   On: 03/05/2022 23:20  ? ?MR BRAIN WO CONTRAST ? ?Result Date: 03/05/2022 ?CLINICAL DATA:  Acute neurologic deficit EXAM: MRI HEAD WITHOUT CONTRAST MRA HEAD WITHOUT CONTRAST TECHNIQUE: Multiplanar,  multi-echo pulse sequences of the brain and surrounding structures were acquired without intravenous contrast. Angiographic images of the Circle of Willis were acquired using MRA technique without intravenous contrast. COMPARISON:  None Available. FINDINGS: MRI HEAD FINDINGS Brain: No acute infarct, mass effect or extra-axial collection. No acute or chronic hemorrhage. Marked progression of bilateral supratentorial white matter disease, most commonly indicating chronic microvascular ischemia. Mild volume loss. The midline structures are normal. Vascular: Major flow voids are preserved. Skull and upper cervical spine: Normal calvarium and skull base. Visualized upper cervical spine and soft tissues are normal. Sinuses/Orbits:No paranasal sinus fluid levels or advanced mucosal thickening. No mastoid or middle ear effusion. Normal orbits. MRA HEAD FINDINGS POSTERIOR CIRCULATION: --Vertebral arteries: Normal --Inferior cerebellar arteries: Normal. --Basilar artery: Normal. --Superior cerebellar arteries: Normal. --Posterior cerebral arteries: Normal. ANTERIOR CIRCULATION: --Intracranial internal carotid arteries: Normal. --Anterior cerebral arteries (ACA): Normal. --Middle cerebral arteries (MCA): Normal. ANATOMIC VARIANTS: None IMPRESSION: 1. No acute intracranial abnormality. 2. Marked progression of bilateral supratentorial white matter disease, most commonly indicating chronic microvascular ischemia. 3. Normal intracranial MRA. Electronically Signed   By: Ulyses Jarred M.D.   On: 03/05/2022 23:20  ? ?ECHOCARDIOGRAM COMPLETE ? ?Result Date: 03/06/2022 ?   ECHOCARDIOGRAM REPORT   Patient Name:   Lauren Chung Date of Exam: 03/06/2022 Medical Rec #:  [id number]       Height:       68.0 in Accession #:    [id number]  Weight:       186.1 lb Date of Birth:  07/10/1966        BSA:          1.982 m? Patient Age:    56 years        BP:           113/79 mmHg Patient Gender: F               HR:           74 bpm. Exam Location:   Inpatient Procedure: 2D Echo, Cardiac Doppler and Color Doppler Indications:    CVA  History:        Patient has prior history of Echocardiogram examinations, most                 recent 06/27/2020. Stroke and TIA;

## 2022-03-06 NOTE — Progress Notes (Signed)
OT Cancellation Note ? ?Patient Details ?Name: Wafaa Deemer ?MRN: [id number] ?DOB: 20-Aug-1966 ? ? ?Cancelled Treatment:    Reason Eval/Treat Not Completed: OT screened, no needs identified, will sign off. Pt is at her baseline. PT states that she needs no assist for mobility and pt demonstrated independent BADL's. OT will sign off at this time. ? ?Cailen Texeira Elane Yolanda Bonine ?03/06/2022, 10:51 AM ?

## 2022-03-06 NOTE — Hospital Course (Signed)
56 y.o. female with medical history significant of systemic lupus with history of pulmonary embolism on warfarin, hypertension, CKD stage IIIa who presents with trouble word finding.  ?  ?Pt reports a circumferential headache with photosensitivity a few days ago unrelieved with NSAIDS ?

## 2022-03-07 ENCOUNTER — Other Ambulatory Visit (HOSPITAL_COMMUNITY): Payer: Self-pay

## 2022-03-07 LAB — ANA W/REFLEX IF POSITIVE: Anti Nuclear Antibody (ANA): NEGATIVE

## 2022-03-07 LAB — HOMOCYSTEINE: Homocysteine: 22.7 umol/L — ABNORMAL HIGH (ref 0.0–14.5)

## 2022-03-07 LAB — BETA-2-GLYCOPROTEIN I ABS, IGG/M/A
Beta-2 Glyco I IgG: <9 GPI IgG units (ref 0–20)
Beta-2-Glycoprotein I IgA: <9 GPI IgA units (ref 0–25)
Beta-2-Glycoprotein I IgM: <9 GPI IgM units (ref 0–32)

## 2022-03-08 LAB — CARDIOLIPIN ANTIBODIES, IGG, IGM, IGA
Anticardiolipin IgA: <9 APL U/mL (ref 0–11)
Anticardiolipin IgG: <9 GPL U/mL (ref 0–14)
Anticardiolipin IgM: <9 MPL U/mL (ref 0–12)

## 2022-03-10 ENCOUNTER — Other Ambulatory Visit (HOSPITAL_COMMUNITY): Payer: Self-pay

## 2022-03-10 ENCOUNTER — Encounter: Payer: Self-pay | Admitting: Physician Assistant

## 2022-03-10 DIAGNOSIS — Z86711 Personal history of pulmonary embolism: Secondary | ICD-10-CM | POA: Diagnosis not present

## 2022-03-10 DIAGNOSIS — Z7901 Long term (current) use of anticoagulants: Secondary | ICD-10-CM | POA: Diagnosis not present

## 2022-03-12 ENCOUNTER — Other Ambulatory Visit (HOSPITAL_COMMUNITY): Payer: Self-pay

## 2022-03-12 LAB — LUPUS ANTICOAGULANT PANEL
DRVVT: 49.6 s — ABNORMAL HIGH (ref 0.0–47.0)
PTT Lupus Anticoagulant: 34 s (ref 0.0–43.5)

## 2022-03-12 LAB — DRVVT MIX: dRVVT Mix: 41.2 s — ABNORMAL HIGH (ref 0.0–40.4)

## 2022-03-12 LAB — DRVVT CONFIRM: dRVVT Confirm: 1.1 ratio (ref 0.8–1.2)

## 2022-03-18 ENCOUNTER — Ambulatory Visit (INDEPENDENT_AMBULATORY_CARE_PROVIDER_SITE_OTHER): Payer: Medicare Other | Admitting: Physician Assistant

## 2022-03-18 ENCOUNTER — Encounter: Payer: Self-pay | Admitting: Physician Assistant

## 2022-03-18 VITALS — BP 125/82 | HR 74 | Ht 68.0 in

## 2022-03-18 DIAGNOSIS — R2990 Unspecified symptoms and signs involving the nervous system: Secondary | ICD-10-CM | POA: Diagnosis not present

## 2022-03-18 NOTE — Progress Notes (Addendum)
?[facility] ?Neurology Division ?Clinic Note - Initial Visit ? ? ?Date: 03/19/22 ? ?Lauren Chung ?MRN: [id number] ?DOB: 09/15/1966 ? ? ?Dear Dr Antony Contras, MD: ? ?Thank you for your kind referral of [name] for consultation of TIA. Although her history is well known to you, please allow [place] to reiterate it for the purpose of our medical record. The patient was here alone.  ?  ? ?History of Present Illness: ?Lauren Chung is a 56 y.o. R-handed female with a history of SLE, PE on chronic Coumadin, migraines, CKD stage 3, Iron deficiency anemia, fibromyalgia depression, anxiety, who reports being in her usual state of health when, while on the phone with her mother she developed an acute episode of unsteadiness, and "could not get the words out, I could not understand, and when I tried to text, my words did not make sense ".  She also reported a severe headache and some dizziness with this symptoms although they quickly resolved.  She reported left greater than right blurred vision.  She waited about 2 hours before presenting to the emergency department for evaluation.  Although the patient denies having had any history of stroke or TIAs, it appears to be documented in prior records.  No vertigo, or dysphagia.  She denies a history of seizures.  She denies any chest pain, or shortness of breath, fever or chills or night sweats.  No recent COVID.  Denies any tobacco.  She denies any new meds or hormonal supplements.  Denies any recent long distance trips or recent surgery.  She has some stressors, caring for her mother with terminal leukemia and her son with autism..  She denies any sick contacts.  She is compliant with her medications.  She is active.  She is on disability due to lupus.  Family history of stroke in uncle. ?  She was asymptomatic upon ED presentation.  It was noted that her INR was 1.5 (undertherapeutic), PT was 17.5 she had an H&H of 10.9/35.8, calcium was normal.  EKG was  essentially unremarkable, normal QT QTc.  2D echo was normal.  CT of the head was negative for acute intracranial abnormality, but mild to moderate chronic small vessel ischemic disease was noted.  MRI of the brain was negative for acute infarct.  Carotid ultrasound was negative for stenosis.  Of note, homocystine was mildly elevated at 22.7, DRVVT also mildly elevated at 49.6 s with the rest of the lab work-up essentially unremarkable.  He was on Crestor 10 mg daily, and Lovenox bridge with a goal of INR 2 to-3. ? ?Out-side paper records, electronic medical record, and images have been reviewed where available and summarized as:  ? ? ?Lab Results  ?Component Value Date  ? HGBA1C 5.0 03/05/2022  ? ?No results found for: VITAMINB12 ?Lab Results  ?Component Value Date  ? TSH 1.272 08/11/2016  ? ?Lab Results  ?Component Value Date  ? ESRSEDRATE 7 08/11/2016  ? ? ?Past Medical History:  ?Diagnosis Date  ? Anemia   ? Anxiety   ? Depression   ? History of blood transfusion   ? in [place]  ? Hyperlipidemia   ? Hypertension   ? Lupus ([place])   ? PE (pulmonary embolism)   ? Pneumonia   ? x 3 - last time 2017  ? PTSD (post-traumatic stress disorder)   ? Stroke [facility])   ? many years ago per patient, no deficits  ? SVD (spontaneous vaginal delivery)   ? x 3  ?  TIA (transient ischemic attack)   ? many years ago per patient, no deficits  ? ? ?Past Surgical History:  ?Procedure Laterality Date  ? ABDOMINAL SURGERY    ? ABLATION    ? gyn procedure for bleeding  ? CYST EXCISION    ? top of head  ? cyst removed    ? right leg with sedation/anesthesia  ? EXCISION MASS HEAD N/A 08/05/2019  ? Procedure: EXCISION OF SCALP CYST;  Surgeon: Izora Gala, MD;  Location: [facility];  Service: ENT;  Laterality: N/A;  ? ? ? ?Medications:  ?Outpatient Encounter Medications as of 03/18/2022  ?Medication Sig  ? alprazolam (XANAX) 1 MG tablet 1 qam  2 qhs (Patient taking differently: Take 1 mg by mouth at bedtime. 1 qam  2 qhs)  ? buPROPion  (WELLBUTRIN XL) 300 MG 24 hr tablet Take 1 tablet (300 mg total) by mouth daily. 1  qam  ? hydroxychloroquine (PLAQUENIL) 200 MG tablet Take 200 mg by mouth daily.  ? LINZESS 72 MCG capsule Take 72 mcg by mouth daily as needed (constipation).  ? metoprolol succinate (TOPROL-XL) 25 MG 24 hr tablet TAKE 1 TABLET (25 MG TOTAL) BY MOUTH IN THE MORNING. (Patient taking differently: Take 25 mg by mouth daily.)  ? warfarin (COUMADIN) 2 MG tablet Take 3 mg by mouth daily.   ? zolpidem (AMBIEN CR) 12.5 MG CR tablet Take 1 tablet (12.5 mg total) by mouth at bedtime.  ? enoxaparin (LOVENOX) 80 MG/0.8ML injection Inject 0.8 mLs (80 mg total) into the skin every 12 (twelve) hours for 5 days.  ? predniSONE (DELTASONE) 5 MG tablet PLEASE SEE ATTACHED FOR DETAILED DIRECTIONS (Patient not taking: Reported on 03/06/2022)  ? rosuvastatin (CRESTOR) 10 MG tablet Take 10 mg by mouth at bedtime.  (Patient not taking: Reported on 03/18/2022)  ? ?No facility-administered encounter medications on file as of 03/18/2022.  ? ? ?Allergies:  ?Allergies  ?Allergen Reactions  ? Codeine Shortness Of Breath  ? Contrast Media [Iodinated Contrast Media] Shortness Of Breath  ? Tylenol [Acetaminophen] Nausea And Vomiting  ? ? ?Family History: ?Family History  ?Problem Relation Age of Onset  ? Leukemia Father   ? Seizures Daughter   ? Drug abuse Mother   ? Alcohol abuse Mother   ? ? ?Social History: ?Social History  ? ?Tobacco Use  ? Smoking status: Never  ? Smokeless tobacco: Never  ?Vaping Use  ? Vaping Use: Never used  ?Substance Use Topics  ? Alcohol use: No  ? Drug use: No  ? ?Social History  ? ?Social History Narrative  ? R handed  ? Live with son and 3 dogs  ? Two story  ? No Caffeine  ? ? ?Vital Signs:  ?BP 125/82   Pulse 74   Ht '5\' 8"'$  (1.727 m)   LMP  (LMP Unknown)   SpO2 96%   BMI 28.29 kg/m?  ?  ?General Medical Exam:   ?General:  Well appearing, comfortable.   ?Eyes/ENT: see cranial nerve examination.   ?Neck:   No carotid  bruits. ?Respiratory:  Clear to auscultation, good air entry bilaterally.   ?Cardiac:  Regular rate and rhythm, no murmur.   ?Extremities:  No deformities, edema, or skin discoloration.  ?Skin:  No rashes or lesions. ? ?Neurological Exam: ?MENTAL STATUS including orientation to time, place, person, recent and remote memory, attention span and concentration, language, and fund of knowledge is normal.  Speech is not dysarthric. ? ?CRANIAL NERVES: ?II:  No visual field defects.  Unremarkable fundi.   ?III-IV-VI: Pupils equal round and reactive to light.  Normal conjugate, extra-ocular eye movements in all directions of gaze.  No nystagmus.  No ptosis.   ?V:  Normal facial sensation.    ?VII:  Normal facial symmetry and movements.   ?VIII:  Normal hearing and vestibular function.   ?IX-X:  Normal palatal movement.   ?XI:  Normal shoulder shrug and head rotation.   ?XII:  Normal tongue strength and range of motion, no deviation or fasciculation. ? ?MOTOR:  No atrophy, fasciculations or abnormal movements.  No pronator drift.  ?  ?SENSORY:  Normal and symmetric perception of light touch, pinprick, vibration, and proprioception.  Romberg's sign absent.  ? ?COORDINATION/GAIT: Normal finger-to- nose-finger and heel-to-shin.  Intact rapid alternating movements bilaterally.  Able to rise from a chair without using arms.  Gait narrow based and stable. Tandem and stressed gait intact.  ? ? ?IMPRESSION/PLAN: ? ?Presumed TIA, in a patient that has a history of TIA per chart report, as well as a history of migraines.  Risk factors include SLE, history of PE, under therapeutic INR at 1.5 among others.  In addition, homocysteine was elevated.  Patient is currently asymptomatic.  Imaging was unremarkable. ?Continue Coumadin with goal 2-3, followed at the Coumadin clinic ?Recommend to follow-up with hematology regarding abnormal homocysteine ?Recommend strong control of the cardiovascular risk factors. ?Follow up in 6 months, and if  continuing to remain asymptomatic, the patient can follow-up with PCP ? ?Total time spent:  51 mins  ? ? ?Thank you for allowing me to participate in patient's care.  If I can answer any additional questions, I would be pleased to do s

## 2022-03-18 NOTE — Patient Instructions (Signed)
Follow up with PCP the  PT/INRs ?Consider Hematology evaluation to make sure that there in no underlying hematological disorder ?Continue Warfarin and Crestor ?F/u with Rheumatology and Cardiology  ? ? ?

## 2022-03-19 DIAGNOSIS — Z7901 Long term (current) use of anticoagulants: Secondary | ICD-10-CM | POA: Diagnosis not present

## 2022-03-19 DIAGNOSIS — Z86711 Personal history of pulmonary embolism: Secondary | ICD-10-CM | POA: Diagnosis not present

## 2022-03-20 ENCOUNTER — Other Ambulatory Visit (HOSPITAL_COMMUNITY): Payer: Self-pay | Admitting: Psychiatry

## 2022-04-02 DIAGNOSIS — M2550 Pain in unspecified joint: Secondary | ICD-10-CM | POA: Diagnosis not present

## 2022-04-02 DIAGNOSIS — Z86718 Personal history of other venous thrombosis and embolism: Secondary | ICD-10-CM | POA: Diagnosis not present

## 2022-04-02 DIAGNOSIS — M549 Dorsalgia, unspecified: Secondary | ICD-10-CM | POA: Diagnosis not present

## 2022-04-02 DIAGNOSIS — M329 Systemic lupus erythematosus, unspecified: Secondary | ICD-10-CM | POA: Diagnosis not present

## 2022-04-02 DIAGNOSIS — M791 Myalgia, unspecified site: Secondary | ICD-10-CM | POA: Diagnosis not present

## 2022-04-02 DIAGNOSIS — M797 Fibromyalgia: Secondary | ICD-10-CM | POA: Diagnosis not present

## 2022-04-02 DIAGNOSIS — M1990 Unspecified osteoarthritis, unspecified site: Secondary | ICD-10-CM | POA: Diagnosis not present

## 2022-04-02 DIAGNOSIS — N1831 Chronic kidney disease, stage 3a: Secondary | ICD-10-CM | POA: Diagnosis not present

## 2022-04-03 ENCOUNTER — Other Ambulatory Visit (HOSPITAL_COMMUNITY): Payer: Self-pay | Admitting: Psychiatry

## 2022-04-03 DIAGNOSIS — Z7901 Long term (current) use of anticoagulants: Secondary | ICD-10-CM | POA: Diagnosis not present

## 2022-04-03 DIAGNOSIS — Z86711 Personal history of pulmonary embolism: Secondary | ICD-10-CM | POA: Diagnosis not present

## 2022-04-08 ENCOUNTER — Ambulatory Visit: Payer: Medicare Other | Admitting: Cardiology

## 2022-04-15 ENCOUNTER — Telehealth (HOSPITAL_BASED_OUTPATIENT_CLINIC_OR_DEPARTMENT_OTHER): Payer: Medicare Other | Admitting: Psychiatry

## 2022-04-15 DIAGNOSIS — F324 Major depressive disorder, single episode, in partial remission: Secondary | ICD-10-CM | POA: Diagnosis not present

## 2022-04-15 MED ORDER — ALPRAZOLAM 1 MG PO TABS: ORAL_TABLET | ORAL | 4 refills | Status: DC

## 2022-04-15 MED ORDER — ZOLPIDEM TARTRATE ER 12.5 MG PO TBCR: 12.5000 mg | EXTENDED_RELEASE_TABLET | Freq: Every day | ORAL | 4 refills | Status: DC

## 2022-04-15 MED ORDER — BUPROPION HCL ER (XL) 300 MG PO TB24: 300.0000 mg | ORAL_TABLET | Freq: Every day | ORAL | 4 refills | Status: DC

## 2022-04-15 NOTE — Progress Notes (Signed)
Psychiatric Initial Adult Assessment   Patient Identification: Lauren Chung MRN:  [id number] Date of Evaluation:  04/15/2022 Referral Source Lauren Chung Chief Complaint:    Visit Diagnosis: Major depression   Today the patient is actually doing a little bit better.  She has more energy.  She denies persistent depression that is paralyzing.  It was very apparent to this patient is that she is very scared to go out of her house.  She is afraid of being shot.  She is frightened of violence in the community.  She feels safe in her home.  She feels better now but she cannot go out.  The increase in Wellbutrin has been helpful.  Her anxiety is reasonably controlled she feels calm in her home.  She is able to watch TV and enjoy her dog.  She takes good care of her autistic son who does not go out very much either.  The patient is sleeping well and eating well.  Her mood the symptoms are fairly well controlled.  She takes prednisone 5 mg a day.  She is not having too much joint pain.  In review of her labs is evident that she does have some mild kidney disturbance but her liver enzymes were all normal.  Patient is on Social Security disability for her lupus.  Her fears of going out seem to be fairly unrealistic.  She saw a movie about the [facility] unit and is frightened if she goes out somebody will shoot from some windows with a high-powered weapon.  Patient seems her mood is even but she is bothered by the idea of going to the public. Virtual Visit via Telephone Note  I connected with Lauren Chung on 04/15/22 at  1:30 PM EDT by telephone and verified that I am speaking with the correct person using two identifiers.  Location: Patient: home Provider: office   I discussed the limitations, risks, security and privacy concerns of performing an evaluation and management service by telephone and the availability of in person appointments. I also discussed with the patient that there may be a patient  responsible charge related to this service. The patient expressed understanding and agreed to proceed.      I discussed the assessment and treatment plan with the patient. The patient was provided an opportunity to ask questions and all were answered. The patient agreed with the plan and demonstrated an understanding of the instructions.   The patient was advised to call back or seek an in-person evaluation if the symptoms worsen or if the condition fails to improve as anticipated.  I provided 25 minutes of non-face-to-face time during this encounter.   Jerral Ralph, MD    Anxiety Symptoms:   Psychotic Symptoms:   PTSD Symptoms:   Past Psychiatric History: Past therapy, one psychiatric hospitalization  Previous Psychotropic Medications: Yes   Substance Abuse History in the last 12 months:  No.  Consequences of Substance Abuse: Negative  Past Medical History:  Past Medical History:  Diagnosis Date   Anemia    Anxiety    Depression    History of blood transfusion    in [place]   Hyperlipidemia    Hypertension    Lupus ([place])    PE (pulmonary embolism)    Pneumonia    x 3 - last time 2017   PTSD (post-traumatic stress disorder)    Stroke ([place])    many years ago per patient, no deficits   SVD (spontaneous vaginal delivery)  x 3   TIA (transient ischemic attack)    many years ago per patient, no deficits    Past Surgical History:  Procedure Laterality Date   ABDOMINAL SURGERY     ABLATION     gyn procedure for bleeding   CYST EXCISION     top of head   cyst removed     right leg with sedation/anesthesia   EXCISION MASS HEAD N/A 08/05/2019   Procedure: EXCISION OF SCALP CYST;  Surgeon: Izora Gala, MD;  Location: [facility];  Service: ENT;  Laterality: N/A;    Family Psychiatric History:   Family History:  Family History  Problem Relation Age of Onset   Leukemia Father    Seizures Daughter    Drug abuse Mother    Alcohol abuse Mother      Social History:   Social History   Socioeconomic History   Marital status: Single    Spouse name: Not on file   Number of children: 3   Years of education: Not on file   Highest education level: Not on file  Occupational History   Not on file  Tobacco Use   Smoking status: Never   Smokeless tobacco: Never  Vaping Use   Vaping Use: Never used  Substance and Sexual Activity   Alcohol use: No   Drug use: No   Sexual activity: Yes    Partners: Male    Birth control/protection: Condom, Post-menopausal  Other Topics Concern   Not on file  Social History Narrative   R handed   Live with son and 3 dogs   Two story   No Caffeine   Social Determinants of Health   Financial Resource Strain: Not on file  Food Insecurity: Not on file  Transportation Needs: Not on file  Physical Activity: Not on file  Stress: Not on file  Social Connections: Not on file    Additional Social History:   Allergies:   Allergies  Allergen Reactions   Codeine Shortness Of Breath   Contrast Media [Iodinated Contrast Media] Shortness Of Breath   Tylenol [Acetaminophen] Nausea And Vomiting    Metabolic Disorder Labs: Lab Results  Component Value Date   HGBA1C 5.0 03/05/2022   MPG 96.8 03/05/2022   No results found for: "PROLACTIN" Lab Results  Component Value Date   CHOL 109 03/06/2022   TRIG 75 03/06/2022   HDL 42 03/06/2022   CHOLHDL 2.6 03/06/2022   VLDL 15 03/06/2022   LDLCALC 52 03/06/2022     Current Medications: Current Outpatient Medications  Medication Sig Dispense Refill   ALPRAZolam (XANAX) 1 MG tablet 1 qam  2 qhs 90 tablet 4   buPROPion (WELLBUTRIN XL) 300 MG 24 hr tablet Take 1 tablet (300 mg total) by mouth daily. 1  qam 304 tablet 4   enoxaparin (LOVENOX) 80 MG/0.8ML injection Inject 0.8 mLs (80 mg total) into the skin every 12 (twelve) hours for 5 days. 8 mL 0   hydroxychloroquine (PLAQUENIL) 200 MG tablet Take 200 mg by mouth daily.     LINZESS 72 MCG  capsule Take 72 mcg by mouth daily as needed (constipation).     metoprolol succinate (TOPROL-XL) 25 MG 24 hr tablet TAKE 1 TABLET (25 MG TOTAL) BY MOUTH IN THE MORNING. (Patient taking differently: Take 25 mg by mouth daily.) 90 tablet 1   predniSONE (DELTASONE) 5 MG tablet PLEASE SEE ATTACHED FOR DETAILED DIRECTIONS (Patient not taking: Reported on 03/06/2022)     rosuvastatin (  CRESTOR) 10 MG tablet Take 10 mg by mouth at bedtime.  (Patient not taking: Reported on 03/18/2022)     warfarin (COUMADIN) 2 MG tablet Take 3 mg by mouth daily.      zolpidem (AMBIEN CR) 12.5 MG CR tablet Take 1 tablet (12.5 mg total) by mouth at bedtime. 30 tablet 4   No current facility-administered medications for this visit.    Neurologic: Headache: No Seizure: No Paresthesias:No  Musculoskeletal: Strength & Muscle Tone: within normal limits Gait & Station: normal Patient leans: N/A  Psychiatric Specialty Exam: ROS  There were no vitals taken for this visit.There is no height or weight on file to calculate BMI.  General Appearance: Casual  Eye Contact:  Good  Speech:  Clear and Coherent  Volume:  Normal  Mood:  Dysphoric  Affect:  Appropriate  Thought Process:  Goal Directed  Orientation:  NA  Thought Content:  Logical  Suicidal Thoughts:  No  Homicidal Thoughts:  No  Memory:  NA  Judgement:  Good  Insight:  Good  Psychomotor Activity:  Normal  Concentration:    Recall:  [place] of Knowledge:Fair  Language: Good  Akathisia:  No  Handed:  Right  AIMS (if indicated):    Assets:  Desire for Improvement  ADL's:  Intact  Cognition: WNL  Sleep:      Treatment Plan Summary: 6/13/20232:10 PM    This patient has 2 problems.  Versus major clinical depression.  The present time in the past when she was paralyzed with depression.  At this time her mood is not all that that has definitely improved by increasing the Wellbutrin to 450 mg which we will continue.  Her second problem is generalized  anxiety disorder.  She takes Xanax 1 mg in the morning and 2 mg at night and does very well.  When her next visit we will talk about the importance of therapy.  At this time my first wish is for her to be stable enough to come to see me in person in 2 months.  We will ask her to do that.  She is not suicidal and she is functioning reasonably well.

## 2022-04-18 ENCOUNTER — Encounter: Payer: Self-pay | Admitting: Cardiology

## 2022-04-18 ENCOUNTER — Ambulatory Visit: Payer: Medicare Other | Admitting: Cardiology

## 2022-04-18 VITALS — BP 115/63 | HR 74 | Temp 98.2°F | Resp 16 | Ht 68.0 in | Wt 167.0 lb

## 2022-04-18 DIAGNOSIS — R002 Palpitations: Secondary | ICD-10-CM

## 2022-04-18 DIAGNOSIS — Z86711 Personal history of pulmonary embolism: Secondary | ICD-10-CM

## 2022-04-18 DIAGNOSIS — IMO0002 Reserved for concepts with insufficient information to code with codable children: Secondary | ICD-10-CM

## 2022-04-18 DIAGNOSIS — I129 Hypertensive chronic kidney disease with stage 1 through stage 4 chronic kidney disease, or unspecified chronic kidney disease: Secondary | ICD-10-CM

## 2022-04-18 DIAGNOSIS — Z8673 Personal history of transient ischemic attack (TIA), and cerebral infarction without residual deficits: Secondary | ICD-10-CM

## 2022-04-18 DIAGNOSIS — N183 Chronic kidney disease, stage 3 unspecified: Secondary | ICD-10-CM | POA: Diagnosis not present

## 2022-04-18 DIAGNOSIS — M329 Systemic lupus erythematosus, unspecified: Secondary | ICD-10-CM

## 2022-04-18 DIAGNOSIS — Z7901 Long term (current) use of anticoagulants: Secondary | ICD-10-CM | POA: Diagnosis not present

## 2022-04-18 MED ORDER — METOPROLOL SUCCINATE ER 25 MG PO TB24: 25.0000 mg | ORAL_TABLET | Freq: Every day | ORAL | 1 refills | Status: DC

## 2022-04-18 NOTE — Progress Notes (Signed)
ID:  Lauren Chung, DOB 1966-08-07, MRN [id number]  PCP:  Antony Contras, MD  Cardiologist:  Rex Kras, DO, [facility] (established care 06/13/2020)  Date: 04/18/22 Last Office Visit: 02/21/2021  Chief Complaint  Patient presents with   Palpitations   Follow-up    1 year    HPI  Lauren Chung is a 56 y.o. female whose past medical history and cardiovascular risk factors include: Hx of pulmonary embolism, hx of CVA /TIA, Lupus, HLD, HTN, CKD, iron deficiency anemia.   Originally referred to the practice for evaluation of palpitations.  She underwent appropriate work-up and was noted to have no unifying diagnoses.  But given her symptoms patient was started on beta-blocker therapy and has done well.  She now presents for 1 year follow-up visit.  Since last office visit patient states that she is doing well from a cardiovascular standpoint; however, recently was hospitalized for TIA thought to be secondary to subtherapeutic INR.  Her INR is currently being managed by PCP with a goal of 2-3.  Patient has a history of pulmonary embolism, CVA/TIA/lupus.  No prior history of miscarriages.  I have asked her to discuss with her PCP possible hematology consult to evaluate for antiphospholipid syndrome as the management and INR goals are different.  Patient states that she will discuss this further with PCP.  FUNCTIONAL STATUS: No structured exercise program or daily routine.   ALLERGIES: Allergies  Allergen Reactions   Codeine Shortness Of Breath   Contrast Media [Iodinated Contrast Media] Shortness Of Breath   Tylenol [Acetaminophen] Nausea And Vomiting    MEDICATION LIST PRIOR TO VISIT: Current Meds  Medication Sig   ALPRAZolam (XANAX) 1 MG tablet 1 qam  2 qhs   buPROPion (WELLBUTRIN XL) 300 MG 24 hr tablet Take 1 tablet (300 mg total) by mouth daily. 1  qam (Patient taking differently: Take 450 mg by mouth daily. 1  qam)   hydroxychloroquine (PLAQUENIL) 200 MG tablet Take 200 mg by  mouth daily.   LINZESS 72 MCG capsule Take 72 mcg by mouth daily as needed (constipation).   predniSONE (DELTASONE) 5 MG tablet    rosuvastatin (CRESTOR) 10 MG tablet Take 10 mg by mouth at bedtime.   warfarin (COUMADIN) 2 MG tablet Take 3 mg by mouth daily.    zolpidem (AMBIEN CR) 12.5 MG CR tablet Take 1 tablet (12.5 mg total) by mouth at bedtime.   [DISCONTINUED] metoprolol succinate (TOPROL-XL) 25 MG 24 hr tablet TAKE 1 TABLET (25 MG TOTAL) BY MOUTH IN THE MORNING. (Patient taking differently: Take 25 mg by mouth daily.)     PAST MEDICAL HISTORY: Past Medical History:  Diagnosis Date   Anemia    Anxiety    Depression    History of blood transfusion    in [place]   Hyperlipidemia    Hypertension    Lupus ([place])    PE (pulmonary embolism)    Pneumonia    x 3 - last time 2017   PTSD (post-traumatic stress disorder)    Stroke ([place])    many years ago per patient, no deficits   SVD (spontaneous vaginal delivery)    x 3   TIA (transient ischemic attack)    many years ago per patient, no deficits    PAST SURGICAL HISTORY: Past Surgical History:  Procedure Laterality Date   ABDOMINAL SURGERY     ABLATION     gyn procedure for bleeding   CYST EXCISION     top of head  cyst removed     right leg with sedation/anesthesia   EXCISION MASS HEAD N/A 08/05/2019   Procedure: EXCISION OF SCALP CYST;  Surgeon: Izora Gala, MD;  Location: [facility];  Service: ENT;  Laterality: N/A;    FAMILY HISTORY: The patient family history includes Alcohol abuse in her mother; Drug abuse in her mother; Leukemia in her father and mother; Seizures in her daughter.  SOCIAL HISTORY:  The patient  reports that she has never smoked. She has never used smokeless tobacco. She reports that she does not drink alcohol and does not use drugs.  REVIEW OF SYSTEMS: Review of Systems  Cardiovascular:  Negative for chest pain, claudication, dyspnea on exertion, leg swelling, near-syncope,  orthopnea, palpitations and syncope.  Respiratory:  Negative for shortness of breath.   Hematologic/Lymphatic: Negative for bleeding problem.  All other systems reviewed and are negative.   PHYSICAL EXAM:    04/18/2022    1:03 PM 03/18/2022    1:26 PM 03/06/2022    5:15 PM  Vitals with BMI  Height 5' 8"  5' 8"    Weight 167 lbs    BMI 66.5    Systolic 993 570 177  Diastolic 63 82 82  Pulse 74 74 68   CONSTITUTIONAL: Well-developed and well-nourished. No acute distress.  SKIN: Skin is warm and dry. No rash noted. No cyanosis. No pallor. No jaundice HEAD: Normocephalic and atraumatic.  EYES: No scleral icterus MOUTH/THROAT: Moist oral membranes.  NECK: No JVD present. No thyromegaly noted. No carotid bruits  CHEST Normal respiratory effort. No intercostal retractions  LUNGS: Clear to auscultation bilaterally.  No stridor. No wheezes. No rales.  CARDIOVASCULAR: Regular, positive S1-S2, no murmurs rubs or gallops appreciated. ABDOMINAL: Nonobese, soft, nontender, nondistended, positive bowel sounds in all 4 quadrants, no apparent ascites.  EXTREMITIES: No peripheral edema  HEMATOLOGIC: No significant bruising NEUROLOGIC: Oriented to person, place, and time. Nonfocal. Normal muscle tone.  PSYCHIATRIC: Normal mood and affect. Normal behavior. Cooperative  CARDIAC DATABASE: EKG: 04/18/2022: Normal sinus rhythm, 60 bpm, normal axis, without underlying ischemia injury pattern.  Echocardiogram: 03/06/2022:  1. Left ventricular ejection fraction, by estimation, is 60 to 65%. The left ventricle has normal function. The left ventricle has no regional wall motion abnormalities. Left ventricular diastolic parameters were normal.   2. Right ventricular systolic function is normal. The right ventricular size is normal. There is normal pulmonary artery systolic pressure.   3. The mitral valve is normal in structure. No evidence of mitral valve regurgitation.   4. The aortic valve is normal in  structure. Aortic valve regurgitation is not visualized.   5. The inferior vena cava is normal in size with greater than 50% respiratory variability, suggesting right atrial pressure of 3 mmHg.   Stress Testing: Exercise tetrofosmin stress test  06/25/2020:  Normal ECG stress. The patient exercised for 5 minutes and 29 seconds of a Bruce protocol, achieving approximately 6.64 METs.  Exercise capacity reduced for age. Normal BP response.  Mild degree medium extent perfusion defect consistent with soft tissue attenuation in the apical inferior wall, mid inferior wall and basal inferior wall of left ventricle.  Overall LV systolic function is normal without regional wall motion abnormalities. Stress LV EF: 63%.  No previous exam available for comparison. Low risk study.   Heart Catheterization: None  7 day extended Holter monitor:  Dominant rhythm normal sinus.  Heart rate 66-154 bpm.  Average HR 96 bpm.  No atrial fibrillation/nonsustained ventricular tachycardia/high grade AV block,  sinus pause greater than or equal to 3 seconds in duration.  Total ventricular ectopic burden <1%.  Total supraventricular ectopic burden <1%.  Number of patient triggered events: 9.  Predominantly underlying rhythm sinus tachycardia  (7 episodes) and 2 episodes of normal sinus rhythm with rare VE.  Auto triggered event on 06/17/2020 at 7:49am patient had sinus rhythm with high degree AV block.  Auto triggered event on 06/15/2020 at 3:27am patient had sinus rhythm with second degree type I AV block.   LABORATORY DATA: External Labs: Collected: 05/21/2020 Creatinine 1.45 mg/dL. eGFR: 38 mL/min per 1.73 m Potassium 3.2, sodium 132, chloride 94, bicarb 25 Lipid profile: Total cholesterol 123, triglycerides 102, HDL 39, LDL 65, non-HDL 84 Hemoglobin: 10.6 g/dL Platelets 459K TSH: 2.31   LABORATORY DATA:    Latest Ref Rng & Units 03/06/2022    2:40 PM 03/05/2022    4:27 PM 03/05/2022    3:54 PM  CBC  WBC 4.0 -  10.5 K/uL 7.4   8.8   Hemoglobin 12.0 - 15.0 g/dL 10.3  12.9  10.9   Hematocrit 36.0 - 46.0 % 34.3  38.0  35.8   Platelets 150 - 400 K/uL 276   288        Latest Ref Rng & Units 03/05/2022    4:27 PM 03/05/2022    3:54 PM 06/13/2020    1:56 PM  CMP  Glucose 70 - 99 mg/dL 78  83  92   BUN 6 - 20 mg/dL 13  12  13    Creatinine 0.44 - 1.00 mg/dL 1.50  1.50  1.50   Sodium 135 - 145 mmol/L 140  137  134   Potassium 3.5 - 5.1 mmol/L 3.7  3.6  3.1   Chloride 98 - 111 mmol/L 101  104  92   CO2 22 - 32 mmol/L  23  24   Calcium 8.9 - 10.3 mg/dL  8.9  9.2   Total Protein 6.5 - 8.1 g/dL  7.1    Total Bilirubin 0.3 - 1.2 mg/dL  0.6    Alkaline Phos 38 - 126 U/L  66    AST 15 - 41 U/L  36    ALT 0 - 44 U/L  26      Lipid Panel     Component Value Date/Time   CHOL 109 03/06/2022 0402   TRIG 75 03/06/2022 0402   HDL 42 03/06/2022 0402   CHOLHDL 2.6 03/06/2022 0402   VLDL 15 03/06/2022 0402   LDLCALC 52 03/06/2022 0402    No components found for: "NTPROBNP" No results for input(s): "PROBNP" in the last 8760 hours. No results for input(s): "TSH" in the last 8760 hours.  BMP Recent Labs    03/05/22 1554 03/05/22 1627  NA 137 140  K 3.6 3.7  CL 104 101  CO2 23  --   GLUCOSE 83 78  BUN 12 13  CREATININE 1.50* 1.50*  CALCIUM 8.9  --   GFRNONAA 41*  --     HEMOGLOBIN A1C Lab Results  Component Value Date   HGBA1C 5.0 03/05/2022   MPG 96.8 03/05/2022    Cardiac Panel (last 3 results) No results for input(s): "CKTOTAL", "CKMB", "TROPONINIHS", "RELINDX" in the last 72 hours.  CHOLESTEROL Recent Labs    03/06/22 0402  CHOL 109    Hepatic Function Panel Recent Labs    03/05/22 1554  PROT 7.1  ALBUMIN 3.9  AST 36  ALT 26  ALKPHOS 66  BILITOT  0.6     IMPRESSION:    ICD-10-CM   1. Palpitations  R00.2 EKG 12-Lead    metoprolol succinate (TOPROL-XL) 25 MG 24 hr tablet    2. Hx-TIA (transient ischemic attack)  Z86.73     3. Benign hypertension with CKD (chronic  kidney disease) stage III (HCC)  I12.9    N18.30     4. Lupus ([place])  M32.9     5. History of CVA (cerebrovascular accident)  Z86.73     6. History of pulmonary embolism  Z86.711     7. Long term (current) use of anticoagulants  Z79.01        RECOMMENDATIONS: Symantha Steeber is a 56 y.o. female whose past medical history and cardiac risk factors include: Hx of pulmonary embolism, hx of CVA, Lupus, HLD, HTN, CKD, iron deficiency anemia.   Patient presents today for 1 year follow-up visit for management of palpitations.  Over the last 1 year patient's symptoms have been well controlled on metoprolol.  She requests refill.  She denies any near-syncope or syncopal event.  Patient is educated on the importance of improving and monitoring her modifiable cardiovascular risk factors.  As noted above I have requested her to discuss with her PCP with regards to possible hematology consult as she has had history of pulmonary embolism/CVA/TIA/lupus which raises the clinical suspicion of possible antiphospholipid syndrome.  This diagnosis itself may change management.  Patient states that she will follow through and discussed this in detail.  Prior cardiovascular testing reviewed as part of today's office visit.  No additional testing warranted at this time.  Recent hospitalization records reviewed including echocardiogram, discharge summary.  We discussed following up on as-needed basis; however, patient would like to follow-up annually.  FINAL MEDICATION LIST END OF ENCOUNTER: Meds ordered this encounter  Medications   metoprolol succinate (TOPROL-XL) 25 MG 24 hr tablet    Sig: Take 1 tablet (25 mg total) by mouth daily. TAKE 1 TABLET (25 MG TOTAL) BY MOUTH IN THE MORNING.    Dispense:  90 tablet    Refill:  1    Current Outpatient Medications:    ALPRAZolam (XANAX) 1 MG tablet, 1 qam  2 qhs, Disp: 90 tablet, Rfl: 4   buPROPion (WELLBUTRIN XL) 300 MG 24 hr tablet, Take 1 tablet (300 mg  total) by mouth daily. 1  qam (Patient taking differently: Take 450 mg by mouth daily. 1  qam), Disp: 304 tablet, Rfl: 4   hydroxychloroquine (PLAQUENIL) 200 MG tablet, Take 200 mg by mouth daily., Disp: , Rfl:    LINZESS 72 MCG capsule, Take 72 mcg by mouth daily as needed (constipation)., Disp: , Rfl:    predniSONE (DELTASONE) 5 MG tablet, , Disp: , Rfl:    rosuvastatin (CRESTOR) 10 MG tablet, Take 10 mg by mouth at bedtime., Disp: , Rfl:    warfarin (COUMADIN) 2 MG tablet, Take 3 mg by mouth daily. , Disp: , Rfl:    zolpidem (AMBIEN CR) 12.5 MG CR tablet, Take 1 tablet (12.5 mg total) by mouth at bedtime., Disp: 30 tablet, Rfl: 4   metoprolol succinate (TOPROL-XL) 25 MG 24 hr tablet, Take 1 tablet (25 mg total) by mouth daily. TAKE 1 TABLET (25 MG TOTAL) BY MOUTH IN THE MORNING., Disp: 90 tablet, Rfl: 1  Orders Placed This Encounter  Procedures   EKG 12-Lead    There are no Patient Instructions on file for this visit.   --Continue cardiac medications as reconciled in final medication  list. --Return in about 1 year (around 04/19/2023) for Follow up palpitations.. Or sooner if needed. --Continue follow-up with your primary care physician regarding the management of your other chronic comorbid conditions.  Patient's questions and concerns were addressed to her satisfaction. She voices understanding of the instructions provided during this encounter.   This note was created using a voice recognition software as a result there may be grammatical errors inadvertently enclosed that do not reflect the nature of this encounter. Every attempt is made to correct such errors.  Total time spent: 21 minutes.  Rex Kras, [place], [facility]  Pager: [phone] Office: [phone]

## 2022-04-22 DIAGNOSIS — M797 Fibromyalgia: Secondary | ICD-10-CM | POA: Diagnosis not present

## 2022-04-22 DIAGNOSIS — N1832 Chronic kidney disease, stage 3b: Secondary | ICD-10-CM | POA: Diagnosis not present

## 2022-04-22 DIAGNOSIS — I129 Hypertensive chronic kidney disease with stage 1 through stage 4 chronic kidney disease, or unspecified chronic kidney disease: Secondary | ICD-10-CM | POA: Diagnosis not present

## 2022-04-22 DIAGNOSIS — K5900 Constipation, unspecified: Secondary | ICD-10-CM | POA: Diagnosis not present

## 2022-04-22 DIAGNOSIS — M329 Systemic lupus erythematosus, unspecified: Secondary | ICD-10-CM | POA: Diagnosis not present

## 2022-04-22 DIAGNOSIS — Z7901 Long term (current) use of anticoagulants: Secondary | ICD-10-CM | POA: Diagnosis not present

## 2022-04-22 DIAGNOSIS — R209 Unspecified disturbances of skin sensation: Secondary | ICD-10-CM | POA: Diagnosis not present

## 2022-04-22 DIAGNOSIS — D649 Anemia, unspecified: Secondary | ICD-10-CM | POA: Diagnosis not present

## 2022-04-22 DIAGNOSIS — G43909 Migraine, unspecified, not intractable, without status migrainosus: Secondary | ICD-10-CM | POA: Diagnosis not present

## 2022-04-22 DIAGNOSIS — E7800 Pure hypercholesterolemia, unspecified: Secondary | ICD-10-CM | POA: Diagnosis not present

## 2022-04-22 DIAGNOSIS — G4700 Insomnia, unspecified: Secondary | ICD-10-CM | POA: Diagnosis not present

## 2022-04-22 DIAGNOSIS — F418 Other specified anxiety disorders: Secondary | ICD-10-CM | POA: Diagnosis not present

## 2022-04-22 DIAGNOSIS — D6859 Other primary thrombophilia: Secondary | ICD-10-CM | POA: Diagnosis not present

## 2022-04-24 ENCOUNTER — Telehealth: Payer: Self-pay | Admitting: Physician Assistant

## 2022-04-24 NOTE — Telephone Encounter (Signed)
Scheduled appt per 6/21 referral. Pt is aware of appt date and time. Pt is aware to arrive 15 mins prior to appt time and to bring and updated insurance card. Pt is aware of appt location.   

## 2022-05-08 DIAGNOSIS — Z7901 Long term (current) use of anticoagulants: Secondary | ICD-10-CM | POA: Diagnosis not present

## 2022-05-08 DIAGNOSIS — Z86711 Personal history of pulmonary embolism: Secondary | ICD-10-CM | POA: Diagnosis not present

## 2022-05-15 DIAGNOSIS — Z7901 Long term (current) use of anticoagulants: Secondary | ICD-10-CM | POA: Diagnosis not present

## 2022-05-15 DIAGNOSIS — Z86711 Personal history of pulmonary embolism: Secondary | ICD-10-CM | POA: Diagnosis not present

## 2022-05-20 ENCOUNTER — Inpatient Hospital Stay: Payer: Medicare Other | Attending: Physician Assistant | Admitting: Physician Assistant

## 2022-05-20 ENCOUNTER — Inpatient Hospital Stay: Payer: Medicare Other

## 2022-05-20 ENCOUNTER — Encounter: Payer: Self-pay | Admitting: Physician Assistant

## 2022-05-20 ENCOUNTER — Other Ambulatory Visit: Payer: Self-pay

## 2022-05-20 VITALS — BP 132/77 | HR 79 | Temp 97.3°F | Resp 15 | Wt 167.9 lb

## 2022-05-20 DIAGNOSIS — D509 Iron deficiency anemia, unspecified: Secondary | ICD-10-CM | POA: Insufficient documentation

## 2022-05-20 DIAGNOSIS — Z79899 Other long term (current) drug therapy: Secondary | ICD-10-CM | POA: Insufficient documentation

## 2022-05-20 DIAGNOSIS — D649 Anemia, unspecified: Secondary | ICD-10-CM

## 2022-05-20 DIAGNOSIS — Z7901 Long term (current) use of anticoagulants: Secondary | ICD-10-CM | POA: Diagnosis not present

## 2022-05-20 DIAGNOSIS — Z86711 Personal history of pulmonary embolism: Secondary | ICD-10-CM | POA: Diagnosis not present

## 2022-05-20 DIAGNOSIS — Z8673 Personal history of transient ischemic attack (TIA), and cerebral infarction without residual deficits: Secondary | ICD-10-CM | POA: Insufficient documentation

## 2022-05-20 LAB — RETIC PANEL
Immature Retic Fract: 21.7 % — ABNORMAL HIGH (ref 2.3–15.9)
RBC.: 4.14 MIL/uL (ref 3.87–5.11)
Retic Count, Absolute: 47.6 10*3/uL (ref 19.0–186.0)
Retic Ct Pct: 1.2 % (ref 0.4–3.1)
Reticulocyte Hemoglobin: 25.1 pg — ABNORMAL LOW (ref >27.9)

## 2022-05-20 LAB — CBC WITH DIFFERENTIAL (CANCER CENTER ONLY)
Abs Immature Granulocytes: 0.03 10*3/uL (ref 0.00–0.07)
Basophils Absolute: 0.1 10*3/uL (ref 0.0–0.1)
Basophils Relative: 1 %
Eosinophils Absolute: 0.1 10*3/uL (ref 0.0–0.5)
Eosinophils Relative: 2 %
HCT: 31.3 % — ABNORMAL LOW (ref 36.0–46.0)
Hemoglobin: 9.8 g/dL — ABNORMAL LOW (ref 12.0–15.0)
Immature Granulocytes: 0 %
Lymphocytes Relative: 29 %
Lymphs Abs: 2.1 10*3/uL (ref 0.7–4.0)
MCH: 24.1 pg — ABNORMAL LOW (ref 26.0–34.0)
MCHC: 31.3 g/dL (ref 30.0–36.0)
MCV: 76.9 fL — ABNORMAL LOW (ref 80.0–100.0)
Monocytes Absolute: 0.4 10*3/uL (ref 0.1–1.0)
Monocytes Relative: 6 %
Neutro Abs: 4.5 10*3/uL (ref 1.7–7.7)
Neutrophils Relative %: 62 %
Platelet Count: 257 10*3/uL (ref 150–400)
RBC: 4.07 MIL/uL (ref 3.87–5.11)
RDW: 16.8 % — ABNORMAL HIGH (ref 11.5–15.5)
WBC Count: 7.3 10*3/uL (ref 4.0–10.5)
nRBC: 0 % (ref 0.0–0.2)

## 2022-05-20 LAB — CMP (CANCER CENTER ONLY)
ALT: 28 U/L (ref 0–44)
AST: 32 U/L (ref 15–41)
Albumin: 4.1 g/dL (ref 3.5–5.0)
Alkaline Phosphatase: 68 U/L (ref 38–126)
Anion gap: 9 (ref 5–15)
BUN: 15 mg/dL (ref 6–20)
CO2: 21 mmol/L — ABNORMAL LOW (ref 22–32)
Calcium: 8.8 mg/dL — ABNORMAL LOW (ref 8.9–10.3)
Chloride: 107 mmol/L (ref 98–111)
Creatinine: 1.29 mg/dL — ABNORMAL HIGH (ref 0.44–1.00)
GFR, Estimated: 49 mL/min — ABNORMAL LOW (ref >60)
Glucose, Bld: 79 mg/dL (ref 70–99)
Potassium: 3.4 mmol/L — ABNORMAL LOW (ref 3.5–5.1)
Sodium: 137 mmol/L (ref 135–145)
Total Bilirubin: 0.1 mg/dL — ABNORMAL LOW (ref 0.3–1.2)
Total Protein: 8 g/dL (ref 6.5–8.1)

## 2022-05-20 LAB — VITAMIN B12: Vitamin B-12: 650 pg/mL (ref 180–914)

## 2022-05-20 LAB — FOLATE: Folate: 7.7 ng/mL (ref >5.9)

## 2022-05-20 NOTE — Progress Notes (Addendum)
[place] [phone]   Fax:([phone] [place] NOTE  Patient Care Team: Antony Contras, MD as PCP - General (Family Medicine)  CHIEF COMPLAINTS/PURPOSE OF CONSULTATION:  History of pulmonary emboli and stroke.   HISTORY OF PRESENTING ILLNESS:  Lauren Chung 56 y.o. female with medical history significant for systemic lupus with history of pulmonary embolism on warfarin, hypertension, CKD stage IIIa. Patient is unaccompanied for this visit.   On review of the previous records, Lauren Chung was hospitalized in May 2023 for dysarthria and left sided weakness. CT head showed chronic ischemic vessel disease and MRI brain was negative for acute infarct. Neurology recommended to continue crestor and lovenox bridge with warfarin.  On exam today, Lauren Chung reports chronic fatigue and requires frequent resting. She denies any appetite changes or weight loss. She denies nausea, vomiting or abdominal pain. She reports struggling with constipation and takes Linzess as needed with relief. She denies easy bruising or signs of bleeding. She reports that her feet get easily cold but denies any neuropathy. She denies fevers, chills, night sweats, shortness of breath, chest pain or cough. She has no other complaints.   MEDICAL HISTORY:  Past Medical History:  Diagnosis Date   Anemia    Anxiety    Depression    History of blood transfusion    in [place]   Hyperlipidemia    Hypertension    Lupus ([place])    PE (pulmonary embolism)    Pneumonia    x 3 - last time 2017   PTSD (post-traumatic stress disorder)    Stroke ([place])    many years ago per patient, no deficits   SVD (spontaneous vaginal delivery)    x 3   TIA (transient ischemic attack)    many years ago per patient, no deficits    SURGICAL HISTORY: Past Surgical History:  Procedure Laterality Date   ABDOMINAL SURGERY     due internal bleeding   ABLATION     gyn procedure for bleeding   CYST  EXCISION     top of head   EXCISION MASS HEAD N/A 08/05/2019   Procedure: EXCISION OF SCALP CYST;  Surgeon: Izora Gala, MD;  Location: [facility];  Service: ENT;  Laterality: N/A;    SOCIAL HISTORY: Social History   Socioeconomic History   Marital status: Single    Spouse name: Not on file   Number of children: 3   Years of education: Not on file   Highest education level: Not on file  Occupational History   Not on file  Tobacco Use   Smoking status: Never   Smokeless tobacco: Never  Vaping Use   Vaping Use: Never used  Substance and Sexual Activity   Alcohol use: No   Drug use: No   Sexual activity: Yes    Partners: Male    Birth control/protection: Condom, Post-menopausal  Other Topics Concern   Not on file  Social History Narrative   R handed   Live with son and 3 dogs   Two story   No Caffeine   Social Determinants of Radio broadcast assistant Strain: Not on file  Food Insecurity: Not on file  Transportation Needs: Not on file  Physical Activity: Not on file  Stress: Not on file  Social Connections: Not on file  Intimate Partner Violence: Not on file    FAMILY HISTORY: Family History  Problem Relation Age of Onset   Drug abuse Mother  Alcohol abuse Mother    Leukemia Mother    Leukemia Father    Seizures Daughter     ALLERGIES:  is allergic to codeine, contrast media [iodinated contrast media], and tylenol [acetaminophen].  MEDICATIONS:  Current Outpatient Medications  Medication Sig Dispense Refill   ALPRAZolam (XANAX) 1 MG tablet 1 qam  2 qhs 90 tablet 4   buPROPion (WELLBUTRIN XL) 300 MG 24 hr tablet Take 1 tablet (300 mg total) by mouth daily. 1  qam (Patient taking differently: Take 450 mg by mouth daily. 1  qam) 304 tablet 4   hydroxychloroquine (PLAQUENIL) 200 MG tablet Take 200 mg by mouth daily.     LINZESS 72 MCG capsule Take 72 mcg by mouth daily as needed (constipation).     metoprolol succinate (TOPROL-XL) 25 MG  24 hr tablet Take 1 tablet (25 mg total) by mouth daily. TAKE 1 TABLET (25 MG TOTAL) BY MOUTH IN THE MORNING. 90 tablet 1   predniSONE (DELTASONE) 5 MG tablet      rosuvastatin (CRESTOR) 10 MG tablet Take 10 mg by mouth at bedtime.     warfarin (COUMADIN) 2 MG tablet Take 3 mg by mouth daily. 2 mg on Tuesday and Thursday and 3 mg other days     zolpidem (AMBIEN CR) 12.5 MG CR tablet Take 1 tablet (12.5 mg total) by mouth at bedtime. 30 tablet 4   No current facility-administered medications for this visit.    REVIEW OF SYSTEMS:   Constitutional: ( - ) fevers, ( - )  chills , ( - ) night sweats Eyes: ( - ) blurriness of vision, ( - ) double vision, ( - ) watery eyes Ears, nose, mouth, throat, and face: ( - ) mucositis, ( - ) sore throat Respiratory: ( - ) cough, ( - ) dyspnea, ( - ) wheezes Cardiovascular: ( - ) palpitation, ( - ) chest discomfort, ( - ) lower extremity swelling Gastrointestinal:  ( - ) nausea, ( - ) heartburn, ( + ) change in bowel habits Skin: ( - ) abnormal skin rashes Lymphatics: ( - ) new lymphadenopathy, ( - ) easy bruising Neurological: ( - ) numbness, ( - ) tingling, ( - ) new weaknesses Behavioral/Psych: ( - ) mood change, ( - ) new changes  All other systems were reviewed with the patient and are negative.  PHYSICAL EXAMINATION: ECOG PERFORMANCE STATUS: 1 - Symptomatic but completely ambulatory  Vitals:   05/20/22 1416  BP: 132/77  Pulse: 79  Resp: 15  Temp: (!) 97.3 F (36.3 C)  SpO2: 100%   Filed Weights   05/20/22 1416  Weight: 167 lb 14.4 oz (76.2 kg)    GENERAL: well appearing female in NAD  SKIN: texture, turgor are normal, no rashes or significant lesions. Pale skin EYES: conjunctiva are pink and non-injected, sclera clear OROPHARYNX: no exudate, no erythema; lips, buccal mucosa, and tongue normal  NECK: supple, non-tender LYMPH:  no palpable lymphadenopathy in the cervical, or supraclavicular lymph nodes.  LUNGS: clear to auscultation and  percussion with normal breathing effort HEART: regular rate & rhythm and no murmurs and no lower extremity edema ABDOMEN: soft, non-tender, non-distended, normal bowel sounds Musculoskeletal: no cyanosis of digits and no clubbing  PSYCH: alert & oriented x 3, fluent speech NEURO: no focal motor/sensory deficits  LABORATORY DATA:  I have reviewed the data as listed    Latest Ref Rng & Units 05/20/2022    4:25 PM 03/06/2022    2:40 PM  03/05/2022    4:27 PM  CBC  WBC 4.0 - 10.5 K/uL 7.3  7.4    Hemoglobin 12.0 - 15.0 g/dL 9.8  10.3  12.9   Hematocrit 36.0 - 46.0 % 31.3  34.3  38.0   Platelets 150 - 400 K/uL 257  276         Latest Ref Rng & Units 05/20/2022    4:25 PM 03/05/2022    4:27 PM 03/05/2022    3:54 PM  CMP  Glucose 70 - 99 mg/dL 79  78  83   BUN 6 - 20 mg/dL '15  13  12   '$ Creatinine 0.44 - 1.00 mg/dL 1.29  1.50  1.50   Sodium 135 - 145 mmol/L 137  140  137   Potassium 3.5 - 5.1 mmol/L 3.4  3.7  3.6   Chloride 98 - 111 mmol/L 107  101  104   CO2 22 - 32 mmol/L 21   23   Calcium 8.9 - 10.3 mg/dL 8.8   8.9   Total Protein 6.5 - 8.1 g/dL 8.0   7.1   Total Bilirubin 0.3 - 1.2 mg/dL 0.1   0.6   Alkaline Phos 38 - 126 U/L 68   66   AST 15 - 41 U/L 32   36   ALT 0 - 44 U/L 28   26     RADIOGRAPHIC STUDIES: I have personally reviewed the radiological images as listed and agreed with the findings in the report. No results found.  ASSESSMENT & PLAN Lauren Chung is a 56 y.o. who presents to the clinic for history of pulmonary embolism and stroke. She is currently on warfarin tolerating well without any prohibitive toxicities.   We reviewed risk factors that can provoke venous thromboembolisms including prolonged travel/immobility, surgery (particular abdominal or orthropedic), trauma,  and pregnancy/ estrogen containing birth control. Outside of lupus, patient does not have any known risk factors that an cause VTEs. Therefore the formal recommendation is lifelong anticoagulation,  as the cause is not transient or reversible.  #H/O pulmonary embolism and stroke --Risk factors include lupus which is not modifiable.  --Recommend indefinite anticoagulation and continue on Coumadin therapy.  --patient denies any bleeding, bruising, or dark stools on this medication. It is well tolerated.  --Labs from 08/2016 ruled out APS, factor 5 leiden and prothrombin gene mutations. More recent labs from 03/06/2022 again ruled our APS.  --Recommend labs today to check CBC, CMP along with Protein C and S levels/activity.    #Microcytic anemia:  --Most recent labs from PCP on 6/20/023 showed Hgb 10.3 , MCV 76.2 --Will check iron, B12 and folate levels today  Follow up: --We will scheduled based on above workup.   Orders Placed This Encounter  Procedures   CBC with Differential ([facility] Only)    Standing Status:   Future    Number of Occurrences:   1    Standing Expiration Date:   05/21/2023   CMP ([facility] only)    Standing Status:   Future    Number of Occurrences:   1    Standing Expiration Date:   05/21/2023   Protein C activity   Protein C, total   Protein S activity   Protein S, total   Ferritin    Standing Status:   Future    Number of Occurrences:   1    Standing Expiration Date:   05/21/2023   Iron and Iron Binding Capacity ([facility] only)  Standing Status:   Future    Number of Occurrences:   1    Standing Expiration Date:   05/21/2023   Retic Panel    Standing Status:   Future    Number of Occurrences:   1    Standing Expiration Date:   05/21/2023   Vitamin B12    Standing Status:   Future    Number of Occurrences:   1    Standing Expiration Date:   05/20/2023   Methylmalonic acid, serum    Standing Status:   Future    Number of Occurrences:   1    Standing Expiration Date:   05/20/2023   Folate, Serum    Standing Status:   Future    Number of Occurrences:   1    Standing Expiration Date:   05/20/2023    All questions were answered. The  patient knows to call the clinic with any problems, questions or concerns.  I have spent a total of 60 minutes minutes of face-to-face and non-face-to-face time, preparing to see the patient, obtaining and/or reviewing separately obtained history, performing a medically appropriate examination, counseling and educating the patient, ordering tests/procedures,  documenting clinical information in the electronic health record and care coordination.   Dede Query, PA-C Department of Hematology/Oncology [place] at [facility] Phone: [phone]  Patient was seen with Dr. Lorenso Courier  I have read the above note and personally examined the patient. I agree with the assessment and plan as noted above.  Briefly Lauren Chung is a 56 year old female who presents for evaluation of pulmonary embolism/stroke as well as anemia.  Because her pulmonary embolism and stroke were unprovoked would recommend indefinite anticoagulation and continue on Coumadin therapy.  Today we will also evaluate her nutritional deficiency with iron panel, ferritin, and full nutritional work-up.  We will plan to see the patient back pending the results of the above studies.   Ledell Peoples, MD Department of Hematology/Oncology [place] at [facility] Phone: [phone] Pager: [phone] Email: [email or web address]

## 2022-05-21 LAB — FERRITIN: Ferritin: 5 ng/mL — ABNORMAL LOW (ref 11–307)

## 2022-05-21 LAB — PROTEIN C ACTIVITY: Protein C Activity: 48 % — ABNORMAL LOW (ref 73–180)

## 2022-05-21 LAB — IRON AND IRON BINDING CAPACITY (CC-WL,HP ONLY)
Iron: 17 ug/dL — ABNORMAL LOW (ref 28–170)
Saturation Ratios: 4 % — ABNORMAL LOW (ref 10.4–31.8)
TIBC: 456 ug/dL — ABNORMAL HIGH (ref 250–450)
UIBC: 439 ug/dL (ref 148–442)

## 2022-05-21 LAB — PROTEIN S, TOTAL: Protein S Ag, Total: 48 % — ABNORMAL LOW (ref 60–150)

## 2022-05-21 LAB — PROTEIN S ACTIVITY: Protein S Activity: 36 % — ABNORMAL LOW (ref 63–140)

## 2022-05-22 LAB — PROTEIN C, TOTAL: Protein C, Total: 58 % — ABNORMAL LOW (ref 60–150)

## 2022-05-23 LAB — METHYLMALONIC ACID, SERUM: Methylmalonic Acid, Quantitative: 140 nmol/L (ref 0–378)

## 2022-05-26 ENCOUNTER — Telehealth: Payer: Self-pay | Admitting: Pharmacy Technician

## 2022-05-26 ENCOUNTER — Telehealth: Payer: Self-pay | Admitting: Physician Assistant

## 2022-05-26 DIAGNOSIS — D509 Iron deficiency anemia, unspecified: Secondary | ICD-10-CM | POA: Insufficient documentation

## 2022-05-26 DIAGNOSIS — D508 Other iron deficiency anemias: Secondary | ICD-10-CM

## 2022-05-26 NOTE — Telephone Encounter (Signed)
Dr. Charlies Silvers, Juluis Rainier note:  Auth Submission: no auth needed Payer: medicare a/b Medication & CPT/J Code(s) submitted: monoferric J1437 Route of submission (phone, fax, portal):PHONE Auth type: Buy/Bill Units/visits requested: X1 DOSE Reference number: [id number] Approval from: 05/26/22 to 11/02/22  Patient has met $226 deductible  Patient will be scheduled as soon as possible.

## 2022-05-26 NOTE — Telephone Encounter (Signed)
I called Ms. Tokiko Diefenderfer to review the lab results from 05/20/2022. Findings show mild protein C and S levels which is consistent with coumadin use. No indication of underlying clotting disorder based on blood work. Continue on Coumdain therapy.   Additionally, Ms. Cunanan has evidence of iron deficiency anemia. Patient has chronic constipation and to prevent worsening symptoms, we will arrange for IV iron infusions. Patient does not have any signs of active bleeding including menstrual bleeding, hematochezia or melena. She denies any dietary restrictions. We will make a referral to gastroenterology to further evaluate underlying cause.   We will see Ms. Nidiffer back in 8 weeks for repeat labs to check iron levels. She expressed understanding of plan provided.

## 2022-05-26 NOTE — Telephone Encounter (Signed)
Scheduled per 7/24 secure chat, called pt but voicemail was full, will mail calender

## 2022-05-27 ENCOUNTER — Emergency Department (HOSPITAL_COMMUNITY): Payer: Medicare Other

## 2022-05-27 ENCOUNTER — Emergency Department (HOSPITAL_COMMUNITY)
Admission: EM | Admit: 2022-05-27 | Discharge: 2022-05-27 | Disposition: A | Discharge status: Home / Self Care | Payer: Medicare Other | Attending: Emergency Medicine | Admitting: Emergency Medicine

## 2022-05-27 ENCOUNTER — Encounter (HOSPITAL_COMMUNITY): Payer: Self-pay

## 2022-05-27 DIAGNOSIS — I639 Cerebral infarction, unspecified: Secondary | ICD-10-CM | POA: Diagnosis not present

## 2022-05-27 DIAGNOSIS — E876 Hypokalemia: Secondary | ICD-10-CM | POA: Diagnosis not present

## 2022-05-27 DIAGNOSIS — Z79899 Other long term (current) drug therapy: Secondary | ICD-10-CM | POA: Insufficient documentation

## 2022-05-27 DIAGNOSIS — H53122 Transient visual loss, left eye: Secondary | ICD-10-CM | POA: Diagnosis not present

## 2022-05-27 DIAGNOSIS — G9389 Other specified disorders of brain: Secondary | ICD-10-CM | POA: Diagnosis not present

## 2022-05-27 DIAGNOSIS — H539 Unspecified visual disturbance: Secondary | ICD-10-CM | POA: Diagnosis not present

## 2022-05-27 DIAGNOSIS — R791 Abnormal coagulation profile: Secondary | ICD-10-CM | POA: Insufficient documentation

## 2022-05-27 DIAGNOSIS — H547 Unspecified visual loss: Secondary | ICD-10-CM | POA: Insufficient documentation

## 2022-05-27 DIAGNOSIS — I6782 Cerebral ischemia: Secondary | ICD-10-CM | POA: Diagnosis not present

## 2022-05-27 DIAGNOSIS — H5462 Unqualified visual loss, left eye, normal vision right eye: Secondary | ICD-10-CM | POA: Diagnosis not present

## 2022-05-27 DIAGNOSIS — I10 Essential (primary) hypertension: Secondary | ICD-10-CM | POA: Diagnosis not present

## 2022-05-27 LAB — COMPREHENSIVE METABOLIC PANEL
ALT: 25 U/L (ref 0–44)
AST: 31 U/L (ref 15–41)
Albumin: 3.5 g/dL (ref 3.5–5.0)
Alkaline Phosphatase: 58 U/L (ref 38–126)
Anion gap: 6 (ref 5–15)
BUN: 16 mg/dL (ref 6–20)
CO2: 26 mmol/L (ref 22–32)
Calcium: 8.5 mg/dL — ABNORMAL LOW (ref 8.9–10.3)
Chloride: 105 mmol/L (ref 98–111)
Creatinine, Ser: 1.29 mg/dL — ABNORMAL HIGH (ref 0.44–1.00)
GFR, Estimated: 49 mL/min — ABNORMAL LOW (ref >60)
Glucose, Bld: 74 mg/dL (ref 70–99)
Potassium: 3 mmol/L — ABNORMAL LOW (ref 3.5–5.1)
Sodium: 137 mmol/L (ref 135–145)
Total Bilirubin: 0.3 mg/dL (ref 0.3–1.2)
Total Protein: 7 g/dL (ref 6.5–8.1)

## 2022-05-27 LAB — CBC
HCT: 31.1 % — ABNORMAL LOW (ref 36.0–46.0)
Hemoglobin: 9.5 g/dL — ABNORMAL LOW (ref 12.0–15.0)
MCH: 23.6 pg — ABNORMAL LOW (ref 26.0–34.0)
MCHC: 30.5 g/dL (ref 30.0–36.0)
MCV: 77.4 fL — ABNORMAL LOW (ref 80.0–100.0)
Platelets: 287 10*3/uL (ref 150–400)
RBC: 4.02 MIL/uL (ref 3.87–5.11)
RDW: 17 % — ABNORMAL HIGH (ref 11.5–15.5)
WBC: 7.9 10*3/uL (ref 4.0–10.5)
nRBC: 0 % (ref 0.0–0.2)

## 2022-05-27 LAB — URINALYSIS, ROUTINE W REFLEX MICROSCOPIC
Bilirubin Urine: NEGATIVE
Glucose, UA: NEGATIVE mg/dL
Hgb urine dipstick: NEGATIVE
Ketones, ur: NEGATIVE mg/dL
Nitrite: NEGATIVE
Protein, ur: NEGATIVE mg/dL
Specific Gravity, Urine: 1.004 — ABNORMAL LOW (ref 1.005–1.030)
pH: 6 (ref 5.0–8.0)

## 2022-05-27 LAB — MAGNESIUM: Magnesium: 1.8 mg/dL (ref 1.7–2.4)

## 2022-05-27 LAB — RAPID URINE DRUG SCREEN, HOSP PERFORMED
Amphetamines: NOT DETECTED
Barbiturates: NOT DETECTED
Benzodiazepines: POSITIVE — AB
Cocaine: NOT DETECTED
Opiates: NOT DETECTED
Tetrahydrocannabinol: NOT DETECTED

## 2022-05-27 LAB — PROTIME-INR
INR: 1.8 — ABNORMAL HIGH (ref 0.8–1.2)
Prothrombin Time: 20.8 seconds — ABNORMAL HIGH (ref 11.4–15.2)

## 2022-05-27 LAB — PREGNANCY, URINE: Preg Test, Ur: NEGATIVE

## 2022-05-27 MED ORDER — LACTATED RINGERS IV BOLUS
1000.0000 mL | Freq: Once | INTRAVENOUS | Status: AC
  Administered 2022-05-27: 1000 mL via INTRAVENOUS

## 2022-05-27 MED ORDER — POTASSIUM CHLORIDE CRYS ER 20 MEQ PO TBCR
40.0000 meq | EXTENDED_RELEASE_TABLET | Freq: Once | ORAL | Status: AC
  Administered 2022-05-27: 40 meq via ORAL
  Filled 2022-05-27: qty 2

## 2022-05-27 MED ORDER — LORAZEPAM 2 MG/ML IJ SOLN
1.0000 mg | Freq: Once | INTRAMUSCULAR | Status: AC
  Administered 2022-05-27: 1 mg via INTRAVENOUS
  Filled 2022-05-27: qty 1

## 2022-05-27 MED ORDER — WARFARIN SODIUM 1 MG PO TABS
1.0000 mg | ORAL_TABLET | Freq: Once | ORAL | Status: AC
Start: 2022-05-27 — End: 2022-05-27
  Administered 2022-05-27: 1 mg via ORAL
  Filled 2022-05-27: qty 1

## 2022-05-27 MED ORDER — WARFARIN SODIUM 2 MG PO TABS
2.0000 mg | ORAL_TABLET | Freq: Once | ORAL | Status: DC
Start: 2022-05-27 — End: 2022-05-27

## 2022-05-27 NOTE — ED Provider Notes (Addendum)
[facility] DEPT Provider Note   CSN: [id number] Arrival date & time: 05/27/22  1517     History  Chief Complaint  Patient presents with   Loss of Vision    Lauren Chung is a 56 y.o. female.  HPI Patient presents for a transient loss of vision.  Medical history includes depression, prior CVA, prior PE, lupus, HTN, anemia, anxiety, HLD.  Patient had a recent hospital admission 2 months ago for strokelike symptoms.  She had negative imaging at that time.  She was advised to continue on Coumadin and to initiate Crestor.  She did not follow-up with neurology yet.  Earlier this morning, at approximately 3 AM, patient woke up from sleep.  While awake, patient had a transient, painless loss of vision from her left eye.  She describes this as a rapid closing in from her periphery followed by complete loss of vision.  She feels that she felt a "pop" at the time of vision loss.  Vision returned after 1 minute. She did not go back to sleep. She has not had any other neurologic symptoms since that time.  She denies any associated headache.      Home Medications Prior to Admission medications   Medication Sig Start Date End Date Taking? Authorizing Provider  zolpidem (AMBIEN CR) 12.5 MG CR tablet Take 1 tablet (12.5 mg total) by mouth at bedtime. 04/15/22  Yes Plovsky, Berneta Sages, MD  ALPRAZolam Duanne Moron) 1 MG tablet 1 qam  2 qhs Patient taking differently: Take 1 mg by mouth 2 (two) times daily. 04/15/22   Plovsky, Berneta Sages, MD  buPROPion (WELLBUTRIN XL) 300 MG 24 hr tablet Take 1 tablet (300 mg total) by mouth daily. 1  qam Patient taking differently: Take 450 mg by mouth daily. 1  qam 04/15/22   Plovsky, Berneta Sages, MD  hydroxychloroquine (PLAQUENIL) 200 MG tablet Take 200 mg by mouth daily. 02/21/22   [provider]  LINZESS 72 MCG capsule Take 72 mcg by mouth daily as needed (constipation). 11/07/21   [provider]  metoprolol succinate (TOPROL-XL) 25 MG 24  hr tablet Take 1 tablet (25 mg total) by mouth daily. TAKE 1 TABLET (25 MG TOTAL) BY MOUTH IN THE MORNING. 04/18/22 10/15/22  Tolia, Sunit, DO  predniSONE (DELTASONE) 5 MG tablet  12/31/21   [provider]  rosuvastatin (CRESTOR) 10 MG tablet Take 10 mg by mouth at bedtime.    [provider]  warfarin (COUMADIN) 2 MG tablet Take 3 mg by mouth daily. 2 mg on Tuesday and Thursday and 3 mg other days    [provider]      Allergies    Codeine, Contrast media [iodinated contrast media], and Tylenol [acetaminophen]    Review of Systems   Review of Systems  Eyes:  Positive for visual disturbance.  All other systems reviewed and are negative.   Physical Exam Updated Vital Signs BP 120/72 (BP Location: Right Arm)   Pulse 71   Temp 98.7 F (37.1 C) (Oral)   Resp 14   Ht '5\' 8"'$  (1.727 m)   Wt 74.8 kg   LMP  (LMP Unknown)   SpO2 100%   BMI 25.09 kg/m  Physical Exam Vitals and nursing note reviewed.  Constitutional:      General: She is not in acute distress.    Appearance: Normal appearance. She is well-developed. She is not ill-appearing, toxic-appearing or diaphoretic.  HENT:     Head: Normocephalic and atraumatic.  Right Ear: External ear normal.     Left Ear: External ear normal.     Nose: Nose normal.     Mouth/Throat:     Mouth: Mucous membranes are moist.     Pharynx: Oropharynx is clear.  Eyes:     General: No visual field deficit.    Extraocular Movements: Extraocular movements intact.     Conjunctiva/sclera: Conjunctivae normal.     Pupils: Pupils are equal, round, and reactive to light.  Cardiovascular:     Rate and Rhythm: Normal rate and regular rhythm.     Heart sounds: No murmur heard. Pulmonary:     Effort: Pulmonary effort is normal. No respiratory distress.     Breath sounds: Normal breath sounds. No wheezing or rales.  Abdominal:     Palpations: Abdomen is soft.     Tenderness: There is no abdominal tenderness.   Musculoskeletal:        General: No swelling, tenderness, deformity or signs of injury. Normal range of motion.     Cervical back: Normal range of motion and neck supple. No rigidity.     Right lower leg: No edema.     Left lower leg: No edema.  Skin:    General: Skin is warm and dry.     Capillary Refill: Capillary refill takes less than 2 seconds.     Coloration: Skin is not jaundiced or pale.  Neurological:     Mental Status: She is alert.     Cranial Nerves: Cranial nerves 2-12 are intact. No cranial nerve deficit, dysarthria or facial asymmetry.     Sensory: Sensation is intact. No sensory deficit.     Motor: No weakness, abnormal muscle tone or pronator drift.     Coordination: Coordination is intact. Coordination normal. Finger-Nose-Finger Test normal.  Psychiatric:        Mood and Affect: Mood normal.        Behavior: Behavior normal.        Thought Content: Thought content normal.        Judgment: Judgment normal.     ED Results / Procedures / Treatments   Labs (all labs ordered are listed, but only abnormal results are displayed) Labs Reviewed  RAPID URINE DRUG SCREEN, HOSP PERFORMED - Abnormal; Notable for the following components:      Result Value   Benzodiazepines POSITIVE (*)    All other components within normal limits  URINALYSIS, ROUTINE W REFLEX MICROSCOPIC - Abnormal; Notable for the following components:   Color, Urine STRAW (*)    Specific Gravity, Urine 1.004 (*)    Leukocytes,Ua TRACE (*)    Bacteria, UA RARE (*)    All other components within normal limits  PROTIME-INR - Abnormal; Notable for the following components:   Prothrombin Time 20.8 (*)    INR 1.8 (*)    All other components within normal limits  COMPREHENSIVE METABOLIC PANEL - Abnormal; Notable for the following components:   Potassium 3.0 (*)    Creatinine, Ser 1.29 (*)    Calcium 8.5 (*)    GFR, Estimated 49 (*)    All other components within normal limits  CBC - Abnormal; Notable  for the following components:   Hemoglobin 9.5 (*)    HCT 31.1 (*)    MCV 77.4 (*)    MCH 23.6 (*)    RDW 17.0 (*)    All other components within normal limits  MAGNESIUM  PREGNANCY, URINE    EKG EKG Interpretation  Date/Time:  Tuesday May 27 2022 10:42:43 EDT Ventricular Rate:  74 PR Interval:  170 QRS Duration: 98 QT Interval:  406 QTC Calculation: 451 R Axis:   67 Text Interpretation: Sinus rhythm Confirmed by Godfrey Pick [phone]) on 05/27/2022 1:22:38 PM  Radiology MR BRAIN WO CONTRAST  Result Date: 05/27/2022 CLINICAL DATA:  Transient ischemic attack (TIA); Stroke/TIA, determine embolic source EXAM: MRI HEAD WITHOUT CONTRAST MRA HEAD WITHOUT CONTRAST MRA NECK WITHOUT CONTRAST TECHNIQUE: Multiplanar, multiecho pulse sequences of the brain and surrounding structures were obtained without intravenous contrast. Angiographic images of the Circle of Willis were obtained using MRA technique without intravenous contrast. Angiographic images of the neck were obtained using MRA technique without intravenous contrast. Carotid stenosis measurements (when applicable) are obtained utilizing NASCET criteria, using the distal internal carotid diameter as the denominator. COMPARISON:  None Available. FINDINGS: MRI HEAD FINDINGS Brain: No acute infarction, hemorrhage, hydrocephalus, extra-axial collection or mass lesion. Scattered small T2/FLAIR hyperintensities within the white matter, overall moderate in advanced for age. Vascular: Detailed below. Skull and upper cervical spine: Normal marrow signal. Sinuses/Orbits: Clear sinuses.  No acute orbital findings. Other: No mastoid effusions. MRA HEAD FINDINGS Anterior circulation: Bilateral intracranial ICAs, MCAs, and ACAs are patent without proximal hemodynamically significant stenosis. Posterior circulation: Bilateral intradural vertebral arteries, basilar artery, and both posterior cerebral arteries are patent without proximal hemodynamically significant  stenosis. MRA NECK FINDINGS Aortic arch: Great vessel origins are patent. Carotid systems: Bilateral common carotid arteries and internal carotid arteries are patent without visible significant (greater than 50%) stenosis. Vertebral arteries: Patent bilaterally without visible significant (greater than 50%) stenosis. IMPRESSION: MRI head: 1. No evidence of acute intracranial abnormality. 2. Moderate, age advanced scattered T2/FLAIR hyperintensities in the white matter that are nonspecific, but most likely secondary to chronic microvascular ischemic disease given the patient's risk factors. Chronic demyelination could have a similar appearance. MRA: No emergent large vessel occlusion or proximal hemodynamically significant stenosis in the head or neck. Electronically Signed   By: Margaretha Sheffield M.D.   On: 05/27/2022 12:32   MR ANGIO HEAD WO CONTRAST  Result Date: 05/27/2022 CLINICAL DATA:  Transient ischemic attack (TIA); Stroke/TIA, determine embolic source EXAM: MRI HEAD WITHOUT CONTRAST MRA HEAD WITHOUT CONTRAST MRA NECK WITHOUT CONTRAST TECHNIQUE: Multiplanar, multiecho pulse sequences of the brain and surrounding structures were obtained without intravenous contrast. Angiographic images of the Circle of Willis were obtained using MRA technique without intravenous contrast. Angiographic images of the neck were obtained using MRA technique without intravenous contrast. Carotid stenosis measurements (when applicable) are obtained utilizing NASCET criteria, using the distal internal carotid diameter as the denominator. COMPARISON:  None Available. FINDINGS: MRI HEAD FINDINGS Brain: No acute infarction, hemorrhage, hydrocephalus, extra-axial collection or mass lesion. Scattered small T2/FLAIR hyperintensities within the white matter, overall moderate in advanced for age. Vascular: Detailed below. Skull and upper cervical spine: Normal marrow signal. Sinuses/Orbits: Clear sinuses.  No acute orbital findings.  Other: No mastoid effusions. MRA HEAD FINDINGS Anterior circulation: Bilateral intracranial ICAs, MCAs, and ACAs are patent without proximal hemodynamically significant stenosis. Posterior circulation: Bilateral intradural vertebral arteries, basilar artery, and both posterior cerebral arteries are patent without proximal hemodynamically significant stenosis. MRA NECK FINDINGS Aortic arch: Great vessel origins are patent. Carotid systems: Bilateral common carotid arteries and internal carotid arteries are patent without visible significant (greater than 50%) stenosis. Vertebral arteries: Patent bilaterally without visible significant (greater than 50%) stenosis. IMPRESSION: MRI head: 1. No evidence of acute intracranial abnormality. 2. Moderate, age advanced scattered T2/FLAIR  hyperintensities in the white matter that are nonspecific, but most likely secondary to chronic microvascular ischemic disease given the patient's risk factors. Chronic demyelination could have a similar appearance. MRA: No emergent large vessel occlusion or proximal hemodynamically significant stenosis in the head or neck. Electronically Signed   By: Margaretha Sheffield M.D.   On: 05/27/2022 12:32   MR ANGIO NECK WO CONTRAST  Result Date: 05/27/2022 CLINICAL DATA:  Transient ischemic attack (TIA); Stroke/TIA, determine embolic source EXAM: MRI HEAD WITHOUT CONTRAST MRA HEAD WITHOUT CONTRAST MRA NECK WITHOUT CONTRAST TECHNIQUE: Multiplanar, multiecho pulse sequences of the brain and surrounding structures were obtained without intravenous contrast. Angiographic images of the Circle of Willis were obtained using MRA technique without intravenous contrast. Angiographic images of the neck were obtained using MRA technique without intravenous contrast. Carotid stenosis measurements (when applicable) are obtained utilizing NASCET criteria, using the distal internal carotid diameter as the denominator. COMPARISON:  None Available. FINDINGS: MRI  HEAD FINDINGS Brain: No acute infarction, hemorrhage, hydrocephalus, extra-axial collection or mass lesion. Scattered small T2/FLAIR hyperintensities within the white matter, overall moderate in advanced for age. Vascular: Detailed below. Skull and upper cervical spine: Normal marrow signal. Sinuses/Orbits: Clear sinuses.  No acute orbital findings. Other: No mastoid effusions. MRA HEAD FINDINGS Anterior circulation: Bilateral intracranial ICAs, MCAs, and ACAs are patent without proximal hemodynamically significant stenosis. Posterior circulation: Bilateral intradural vertebral arteries, basilar artery, and both posterior cerebral arteries are patent without proximal hemodynamically significant stenosis. MRA NECK FINDINGS Aortic arch: Great vessel origins are patent. Carotid systems: Bilateral common carotid arteries and internal carotid arteries are patent without visible significant (greater than 50%) stenosis. Vertebral arteries: Patent bilaterally without visible significant (greater than 50%) stenosis. IMPRESSION: MRI head: 1. No evidence of acute intracranial abnormality. 2. Moderate, age advanced scattered T2/FLAIR hyperintensities in the white matter that are nonspecific, but most likely secondary to chronic microvascular ischemic disease given the patient's risk factors. Chronic demyelination could have a similar appearance. MRA: No emergent large vessel occlusion or proximal hemodynamically significant stenosis in the head or neck. Electronically Signed   By: Margaretha Sheffield M.D.   On: 05/27/2022 12:32   CT HEAD WO CONTRAST  Result Date: 05/27/2022 CLINICAL DATA:  Transient vision loss in left eye EXAM: CT HEAD WITHOUT CONTRAST TECHNIQUE: Contiguous axial images were obtained from the base of the skull through the vertex without intravenous contrast. RADIATION DOSE REDUCTION: This exam was performed according to the departmental dose-optimization program which includes automated exposure control,  adjustment of the mA and/or kV according to patient size and/or use of iterative reconstruction technique. COMPARISON:  CT 03/05/2022 FINDINGS: Brain: No evidence of acute infarction, hemorrhage, hydrocephalus, extra-axial collection or mass lesion/mass effect. Mild cerebral volume loss. Ill-defined hypoattenuation within the cerebral white matter is nonspecific but consistent with chronic small vessel ischemic disease. Unchanged 1 cm focus of extra-axial calcification over the right parietal convexity may reflect a meningioma. No significant mass effect or edema. Vascular: No hyperdense vessel or unexpected calcification. Skull: Normal. Negative for fracture or focal lesion. Sinuses/Orbits: Paranasal sinuses and mastoid air cells are well aerated. Unremarkable orbits. Other: None. IMPRESSION: 1. No acute intracranial abnormality. 2. Mild-to-moderate chronic small vessel ischemic disease similar to 03/05/2022. Electronically Signed   By: Placido Sou M.D.   On: 05/27/2022 11:26    Procedures Procedures    Medications Ordered in ED Medications  warfarin (COUMADIN) tablet 2 mg (has no administration in time range)  LORazepam (ATIVAN) injection 1 mg (1 mg Intravenous Given  05/27/22 1129)  lactated ringers bolus 1,000 mL (1,000 mLs Intravenous New Bag/Given 05/27/22 1037)  potassium chloride SA (KLOR-CON M) CR tablet 40 mEq (40 mEq Oral Given 05/27/22 1334)    ED Course/ Medical Decision Making/ A&P                           Medical Decision Making Amount and/or Complexity of Data Reviewed Labs: ordered. Radiology: ordered.  Risk Prescription drug management.   This patient presents to the ED for concern of transient loss of vision to left eye, this involves an extensive number of treatment options, and is a complaint that carries with it a high risk of complications and morbidity.  The differential diagnosis includes amaurosis fugax, migraine headache, CVA   Co morbidities that complicate  the patient evaluation  depression, prior CVA, prior PE, lupus, HTN, anemia, anxiety, HLD   Additional history obtained:  Additional history obtained from N/A External records from outside source obtained and reviewed including EMR   Lab Tests:  I Ordered, and personally interpreted labs.  The pertinent results include: Hypokalemia, slightly subtherapeutic INR, baseline anemia, improved creatinine from baseline, otherwise normal lab findings   Imaging Studies ordered:  I ordered imaging studies including CT head, MRI brain, MRA of head and neck I independently visualized and interpreted imaging which showed no acute findings I agree with the radiologist interpretation   Cardiac Monitoring: / EKG:  The patient was maintained on a cardiac monitor.  I personally viewed and interpreted the cardiac monitored which showed an underlying rhythm of: Sinus rhythm   Consultations Obtained:  I requested consultation with the neurologist, Dr. Leonel Ramsay,  and discussed lab and imaging findings as well as pertinent plan - they recommend: No further patient/ED work-up.  Additional dose of Coumadin in the ED for subtherapeutic INR and follow-up with repeat labs later this week   Problem List / ED Course / Critical interventions / Medication management  Patient is a 56 year old female who presents for a transient episode of painless vision loss that occurred early this morning at about 3 AM.  She describes a complete blackening of the vision from her left eye.  This lasted for approximately 1 minute.  She did not had any other neurologic symptoms or any other associated symptoms.  Patient's vital signs are normal on arrival.  She is well-appearing on exam.  She has no focal neurologic deficits.  He is concerning for amaurosis fugax.  Patient underwent noncontrasted CT scan of head.  MR studies were ordered as well.  Given her contrast allergy, MRA of head and neck were ordered.  Patient remained  asymptomatic while in the ED.  Lab work is notable for hypokalemia and replacement potassium was given in the ED.  Her INR is slightly subtherapeutic.  Imaging studies showed no acute findings.  I discussed this with neurologist on-call, Dr. Leonel Ramsay, who recommends no further work-up at this time.  Patient to receive an additional dose of Coumadin while in the ED.  She states that she already has a follow-up appointment scheduled for later this week for lab check.  Patient is stable for discharge at this time with neurology follow-up. I ordered medication including Coumadin for therapeutic INR; potassium chloride for hypokalemia; Ativan for anxiolysis during MRI; IV fluids for hydration Reevaluation of the patient after these medicines showed that the patient stayed the same I have reviewed the patients home medicines and have made adjustments as needed  Social Determinants of Health:  Has access to outpatient care         Final Clinical Impression(s) / ED Diagnoses Final diagnoses:  Vision disturbance    Rx / DC Orders ED Discharge Orders     None         Godfrey Pick, MD 05/27/22 1524    Godfrey Pick, MD 05/27/22 1615

## 2022-05-27 NOTE — ED Triage Notes (Signed)
Pt presents with c/o loss of vision in her left eye for approx one minute around 4 am. Pt reports that she had a TIA a few months ago. Pt reports a slight headache at this time.

## 2022-05-27 NOTE — ED Notes (Signed)
Patient transported to CT 

## 2022-05-27 NOTE — ED Notes (Signed)
Patient transported to MRI 

## 2022-05-27 NOTE — Discharge Instructions (Addendum)
Your imaging studies showed no acute findings.  Your INR was slightly subtherapeutic.  You received a extra 2 mg dose of warfarin here in the emergency department.  Continue your warfarin as prescribed.  Follow-up with your PCP for recheck of your INR later this week.  Follow-up with neurology.  Contact information is below.  Return to the emergency department at any time for any new symptoms of concern

## 2022-05-29 DIAGNOSIS — Z7901 Long term (current) use of anticoagulants: Secondary | ICD-10-CM | POA: Diagnosis not present

## 2022-05-29 DIAGNOSIS — Z86711 Personal history of pulmonary embolism: Secondary | ICD-10-CM | POA: Diagnosis not present

## 2022-06-02 ENCOUNTER — Ambulatory Visit (INDEPENDENT_AMBULATORY_CARE_PROVIDER_SITE_OTHER): Payer: Medicare Other

## 2022-06-02 VITALS — BP 118/79 | HR 68 | Temp 98.5°F | Resp 18 | Ht 68.0 in | Wt 165.4 lb

## 2022-06-02 DIAGNOSIS — D508 Other iron deficiency anemias: Secondary | ICD-10-CM

## 2022-06-02 MED ORDER — SODIUM CHLORIDE 0.9 % IV SOLN
1000.0000 mg | Freq: Once | INTRAVENOUS | Status: AC
  Administered 2022-06-02: 1000 mg via INTRAVENOUS
  Filled 2022-06-02: qty 10

## 2022-06-02 NOTE — Patient Instructions (Signed)

## 2022-06-02 NOTE — Progress Notes (Cosign Needed Addendum)
Diagnosis: Iron Deficiency Anemia  Provider:  Marshell Garfinkel, MD  Procedure: Infusion  IV Type: Peripheral, IV Location: L Antecubital  Monoferric (Ferric Derisomaltose), Dose: 1000 mg  Infusion Start Time: 1771  Infusion Stop Time: 1657  Post Infusion IV Care: At 1525 Pt C/O feeling hot, denies any other symptoms.Rate decreased to 150 ml/hr.  At 1538 Pt C/O headache but denies any other signs and symptoms.V/S stable.Infusion stopped for 15 minutes.  1553 Pt denies any headache and stated feeling fine,Infusion restarted at the rate of 150 ml/hr.   Pt completed 30 minutes without any symptoms. Discharge instruction given . Pt verbalized understanding.  Discharge: Condition: Good, Destination: Home . AVS provided to patient.   Performed by:  Arnoldo Morale, RN

## 2022-06-18 DIAGNOSIS — Z7901 Long term (current) use of anticoagulants: Secondary | ICD-10-CM | POA: Diagnosis not present

## 2022-06-18 DIAGNOSIS — Z86711 Personal history of pulmonary embolism: Secondary | ICD-10-CM | POA: Diagnosis not present

## 2022-06-19 DIAGNOSIS — M329 Systemic lupus erythematosus, unspecified: Secondary | ICD-10-CM | POA: Diagnosis not present

## 2022-06-19 DIAGNOSIS — Z86718 Personal history of other venous thrombosis and embolism: Secondary | ICD-10-CM | POA: Diagnosis not present

## 2022-06-19 DIAGNOSIS — M791 Myalgia, unspecified site: Secondary | ICD-10-CM | POA: Diagnosis not present

## 2022-06-19 DIAGNOSIS — R748 Abnormal levels of other serum enzymes: Secondary | ICD-10-CM | POA: Diagnosis not present

## 2022-06-19 DIAGNOSIS — M0609 Rheumatoid arthritis without rheumatoid factor, multiple sites: Secondary | ICD-10-CM | POA: Diagnosis not present

## 2022-06-19 DIAGNOSIS — M1990 Unspecified osteoarthritis, unspecified site: Secondary | ICD-10-CM | POA: Diagnosis not present

## 2022-06-19 DIAGNOSIS — M797 Fibromyalgia: Secondary | ICD-10-CM | POA: Diagnosis not present

## 2022-06-19 DIAGNOSIS — N1831 Chronic kidney disease, stage 3a: Secondary | ICD-10-CM | POA: Diagnosis not present

## 2022-06-19 DIAGNOSIS — M2550 Pain in unspecified joint: Secondary | ICD-10-CM | POA: Diagnosis not present

## 2022-06-19 DIAGNOSIS — M549 Dorsalgia, unspecified: Secondary | ICD-10-CM | POA: Diagnosis not present

## 2022-07-02 ENCOUNTER — Ambulatory Visit (HOSPITAL_COMMUNITY): Payer: Medicare Other | Admitting: Psychiatry

## 2022-07-02 DIAGNOSIS — Z86711 Personal history of pulmonary embolism: Secondary | ICD-10-CM | POA: Diagnosis not present

## 2022-07-02 DIAGNOSIS — Z7901 Long term (current) use of anticoagulants: Secondary | ICD-10-CM | POA: Diagnosis not present

## 2022-07-17 DIAGNOSIS — Z86711 Personal history of pulmonary embolism: Secondary | ICD-10-CM | POA: Diagnosis not present

## 2022-07-17 DIAGNOSIS — Z7901 Long term (current) use of anticoagulants: Secondary | ICD-10-CM | POA: Diagnosis not present

## 2022-07-21 ENCOUNTER — Other Ambulatory Visit: Payer: Self-pay

## 2022-07-21 ENCOUNTER — Other Ambulatory Visit: Payer: Self-pay | Admitting: Physician Assistant

## 2022-07-21 DIAGNOSIS — D508 Other iron deficiency anemias: Secondary | ICD-10-CM

## 2022-07-22 ENCOUNTER — Inpatient Hospital Stay: Payer: Medicare Other | Attending: Physician Assistant

## 2022-07-22 ENCOUNTER — Other Ambulatory Visit: Payer: Self-pay

## 2022-07-22 ENCOUNTER — Inpatient Hospital Stay (HOSPITAL_BASED_OUTPATIENT_CLINIC_OR_DEPARTMENT_OTHER): Payer: Medicare Other | Admitting: Physician Assistant

## 2022-07-22 VITALS — BP 139/92 | HR 64 | Temp 97.3°F | Resp 14 | Ht 68.0 in | Wt 162.3 lb

## 2022-07-22 DIAGNOSIS — K5900 Constipation, unspecified: Secondary | ICD-10-CM | POA: Diagnosis not present

## 2022-07-22 DIAGNOSIS — Z79899 Other long term (current) drug therapy: Secondary | ICD-10-CM | POA: Insufficient documentation

## 2022-07-22 DIAGNOSIS — D508 Other iron deficiency anemias: Secondary | ICD-10-CM

## 2022-07-22 DIAGNOSIS — Z86711 Personal history of pulmonary embolism: Secondary | ICD-10-CM | POA: Insufficient documentation

## 2022-07-22 DIAGNOSIS — D509 Iron deficiency anemia, unspecified: Secondary | ICD-10-CM | POA: Diagnosis not present

## 2022-07-22 DIAGNOSIS — Z7901 Long term (current) use of anticoagulants: Secondary | ICD-10-CM | POA: Diagnosis not present

## 2022-07-22 DIAGNOSIS — Z8673 Personal history of transient ischemic attack (TIA), and cerebral infarction without residual deficits: Secondary | ICD-10-CM | POA: Insufficient documentation

## 2022-07-22 LAB — CBC WITH DIFFERENTIAL (CANCER CENTER ONLY)
Abs Immature Granulocytes: 0.02 10*3/uL (ref 0.00–0.07)
Basophils Absolute: 0.1 10*3/uL (ref 0.0–0.1)
Basophils Relative: 1 %
Eosinophils Absolute: 0.2 10*3/uL (ref 0.0–0.5)
Eosinophils Relative: 2 %
HCT: 42.2 % (ref 36.0–46.0)
Hemoglobin: 13.7 g/dL (ref 12.0–15.0)
Immature Granulocytes: 0 %
Lymphocytes Relative: 24 %
Lymphs Abs: 2 10*3/uL (ref 0.7–4.0)
MCH: 27.7 pg (ref 26.0–34.0)
MCHC: 32.5 g/dL (ref 30.0–36.0)
MCV: 85.4 fL (ref 80.0–100.0)
Monocytes Absolute: 0.5 10*3/uL (ref 0.1–1.0)
Monocytes Relative: 6 %
Neutro Abs: 5.5 10*3/uL (ref 1.7–7.7)
Neutrophils Relative %: 67 %
Platelet Count: 238 10*3/uL (ref 150–400)
RBC: 4.94 MIL/uL (ref 3.87–5.11)
RDW: 23.2 % — ABNORMAL HIGH (ref 11.5–15.5)
WBC Count: 8.3 10*3/uL (ref 4.0–10.5)
nRBC: 0 % (ref 0.0–0.2)

## 2022-07-22 LAB — IRON AND IRON BINDING CAPACITY (CC-WL,HP ONLY)
Iron: 46 ug/dL (ref 28–170)
Saturation Ratios: 18 % (ref 10.4–31.8)
TIBC: 262 ug/dL (ref 250–450)
UIBC: 216 ug/dL (ref 148–442)

## 2022-07-22 LAB — RETIC PANEL
Immature Retic Fract: 7.5 % (ref 2.3–15.9)
RBC.: 5.02 MIL/uL (ref 3.87–5.11)
Retic Count, Absolute: 54.2 10*3/uL (ref 19.0–186.0)
Retic Ct Pct: 1.1 % (ref 0.4–3.1)
Reticulocyte Hemoglobin: 33 pg (ref >27.9)

## 2022-07-22 LAB — CMP (CANCER CENTER ONLY)
ALT: 35 U/L (ref 0–44)
AST: 33 U/L (ref 15–41)
Albumin: 4.1 g/dL (ref 3.5–5.0)
Alkaline Phosphatase: 69 U/L (ref 38–126)
Anion gap: 6 (ref 5–15)
BUN: 12 mg/dL (ref 6–20)
CO2: 29 mmol/L (ref 22–32)
Calcium: 8.9 mg/dL (ref 8.9–10.3)
Chloride: 105 mmol/L (ref 98–111)
Creatinine: 1.33 mg/dL — ABNORMAL HIGH (ref 0.44–1.00)
GFR, Estimated: 47 mL/min — ABNORMAL LOW (ref >60)
Glucose, Bld: 78 mg/dL (ref 70–99)
Potassium: 3.6 mmol/L (ref 3.5–5.1)
Sodium: 140 mmol/L (ref 135–145)
Total Bilirubin: 0.5 mg/dL (ref 0.3–1.2)
Total Protein: 7.1 g/dL (ref 6.5–8.1)

## 2022-07-22 LAB — FERRITIN: Ferritin: 44 ng/mL (ref 11–307)

## 2022-07-22 NOTE — Progress Notes (Signed)
[place] [phone]   [phone]  PROGRESS NOTE  Patient Care Team: Antony Contras, MD as PCP - General (Family Medicine)  CHIEF COMPLAINTS/PURPOSE OF CONSULTATION:  History of pulmonary emboli and stroke.  Iron deficiency anemia  HISTORY OF PRESENTING ILLNESS:  Lauren Chung 56 y.o. female returns for follow-up for history of pulmonary embolism and stroke along with iron deficiency anemia.  She was last seen on 05/20/2022 to establish care.  In the interim, she received IV iron infusion on 06/02/2022.  She is unaccompanied for this visit.  On exam today, Ms. Herrada continues to struggle with chronic fatigue.  She reports she did not notice improvement of energy levels after receiving IV iron.  She reports her appetite is stable without any noticeable weight loss.  She denies nausea, vomiting or abdominal pain.  Her bowel habits are stable with constipation that is managed with Linzess. She denies easy bruising or signs of bleeding. She denies fevers, chills, night sweats, shortness of breath, chest pain or cough. She has no other complaints. Rest of the 10 point ROS is below.   MEDICAL HISTORY:  Past Medical History:  Diagnosis Date   Anemia    Anxiety    Depression    History of blood transfusion    in [place]   Hyperlipidemia    Hypertension    Lupus ([place])    PE (pulmonary embolism)    Pneumonia    x 3 - last time 2017   PTSD (post-traumatic stress disorder)    Stroke ([place])    many years ago per patient, no deficits   SVD (spontaneous vaginal delivery)    x 3   TIA (transient ischemic attack)    many years ago per patient, no deficits    SURGICAL HISTORY: Past Surgical History:  Procedure Laterality Date   ABDOMINAL SURGERY     due internal bleeding   ABLATION     gyn procedure for bleeding   CYST EXCISION     top of head   EXCISION MASS HEAD N/A 08/05/2019   Procedure: EXCISION OF SCALP CYST;  Surgeon: Izora Gala, MD;   Location: [facility];  Service: ENT;  Laterality: N/A;    SOCIAL HISTORY: Social History   Socioeconomic History   Marital status: Single    Spouse name: Not on file   Number of children: 3   Years of education: Not on file   Highest education level: Not on file  Occupational History   Not on file  Tobacco Use   Smoking status: Never   Smokeless tobacco: Never  Vaping Use   Vaping Use: Never used  Substance and Sexual Activity   Alcohol use: No   Drug use: No   Sexual activity: Yes    Partners: Male    Birth control/protection: Condom, Post-menopausal  Other Topics Concern   Not on file  Social History Narrative   R handed   Live with son and 3 dogs   Two story   No Caffeine   Social Determinants of Radio broadcast assistant Strain: Not on file  Food Insecurity: Not on file  Transportation Needs: Not on file  Physical Activity: Not on file  Stress: Not on file  Social Connections: Not on file  Intimate Partner Violence: Not on file    FAMILY HISTORY: Family History  Problem Relation Age of Onset   Drug abuse Mother    Alcohol abuse Mother    Leukemia Mother  Leukemia Father    Seizures Daughter     ALLERGIES:  is allergic to codeine, contrast media [iodinated contrast media], and tylenol [acetaminophen].  MEDICATIONS:  Current Outpatient Medications  Medication Sig Dispense Refill   ALPRAZolam (XANAX) 1 MG tablet 1 qam  2 qhs (Patient taking differently: Take 1 mg by mouth 2 (two) times daily.) 90 tablet 4   buPROPion (WELLBUTRIN XL) 300 MG 24 hr tablet Take 1 tablet (300 mg total) by mouth daily. 1  qam (Patient taking differently: Take 450 mg by mouth daily. 1  qam) 304 tablet 4   hydroxychloroquine (PLAQUENIL) 200 MG tablet Take 200 mg by mouth daily.     leflunomide (ARAVA) 10 MG tablet Take 10 mg by mouth daily.     LINZESS 72 MCG capsule Take 72 mcg by mouth daily as needed (constipation).     metoprolol succinate (TOPROL-XL) 25  MG 24 hr tablet Take 1 tablet (25 mg total) by mouth daily. TAKE 1 TABLET (25 MG TOTAL) BY MOUTH IN THE MORNING. 90 tablet 1   predniSONE (DELTASONE) 5 MG tablet Take 5 mg by mouth daily with breakfast.     rosuvastatin (CRESTOR) 10 MG tablet Take 10 mg by mouth in the morning.     warfarin (COUMADIN) 2 MG tablet Take 2-3 mg by mouth as directed. Take 2 mg on (Tuesday and Thursday) and Take 3 mg all other days     zolpidem (AMBIEN CR) 12.5 MG CR tablet Take 1 tablet (12.5 mg total) by mouth at bedtime. 30 tablet 4   No current facility-administered medications for this visit.    REVIEW OF SYSTEMS:   Constitutional: ( - ) fevers, ( - )  chills , ( - ) night sweats Eyes: ( - ) blurriness of vision, ( - ) double vision, ( - ) watery eyes Ears, nose, mouth, throat, and face: ( - ) mucositis, ( - ) sore throat Respiratory: ( - ) cough, ( - ) dyspnea, ( - ) wheezes Cardiovascular: ( - ) palpitation, ( - ) chest discomfort, ( - ) lower extremity swelling Gastrointestinal:  ( - ) nausea, ( - ) heartburn, ( + ) change in bowel habits Skin: ( - ) abnormal skin rashes Lymphatics: ( - ) new lymphadenopathy, ( - ) easy bruising Neurological: ( - ) numbness, ( - ) tingling, ( - ) new weaknesses Behavioral/Psych: ( - ) mood change, ( - ) new changes  All other systems were reviewed with the patient and are negative.  PHYSICAL EXAMINATION: ECOG PERFORMANCE STATUS: 1 - Symptomatic but completely ambulatory  Vitals:   07/22/22 1509  BP: (!) 139/92  Pulse: 64  Resp: 14  Temp: (!) 97.3 F (36.3 C)  SpO2: 98%   Filed Weights   07/22/22 1509  Weight: 162 lb 4.8 oz (73.6 kg)    GENERAL: well appearing female in NAD  SKIN: texture, turgor are normal, no rashes or significant lesions. EYES: conjunctiva are pink and non-injected, sclera clear OROPHARYNX: no exudate, no erythema; lips, buccal mucosa, and tongue normal  LUNGS: clear to auscultation and percussion with normal breathing effort HEART:  regular rate & rhythm and no murmurs and no lower extremity edema Musculoskeletal: no cyanosis of digits and no clubbing  PSYCH: alert & oriented x 3, fluent speech NEURO: no focal motor/sensory deficits  LABORATORY DATA:  I have reviewed the data as listed    Latest Ref Rng & Units 07/22/2022    2:56 PM 05/27/2022  10:00 AM 05/20/2022    4:25 PM  CBC  WBC 4.0 - 10.5 K/uL 8.3  7.9  7.3   Hemoglobin 12.0 - 15.0 g/dL 13.7  9.5  9.8   Hematocrit 36.0 - 46.0 % 42.2  31.1  31.3   Platelets 150 - 400 K/uL 238  287  257        Latest Ref Rng & Units 05/27/2022   10:00 AM 05/20/2022    4:25 PM 03/05/2022    4:27 PM  CMP  Glucose 70 - 99 mg/dL 74  79  78   BUN 6 - 20 mg/dL '16  15  13   '$ Creatinine 0.44 - 1.00 mg/dL 1.29  1.29  1.50   Sodium 135 - 145 mmol/L 137  137  140   Potassium 3.5 - 5.1 mmol/L 3.0  3.4  3.7   Chloride 98 - 111 mmol/L 105  107  101   CO2 22 - 32 mmol/L 26  21    Calcium 8.9 - 10.3 mg/dL 8.5  8.8    Total Protein 6.5 - 8.1 g/dL 7.0  8.0    Total Bilirubin 0.3 - 1.2 mg/dL 0.3  0.1    Alkaline Phos 38 - 126 U/L 58  68    AST 15 - 41 U/L 31  32    ALT 0 - 44 U/L 25  28      RADIOGRAPHIC STUDIES: I have personally reviewed the radiological images as listed and agreed with the findings in the report. No results found.  ASSESSMENT & PLAN Thi Kannan is a 56 y.o. who presents for a follow up for history of pulmonary embolism and stroke along with iron deficiency anemia.    #H/O pulmonary embolism and stroke --Risk factors include lupus which is not modifiable.  --Recommend indefinite anticoagulation and continue on Coumadin therapy.  --patient denies any bleeding, bruising, or dark stools on this medication. It is well tolerated.  --Labs from 08/2016 ruled out APS, factor 5 leiden and prothrombin gene mutations. Repeat labs from 03/06/2022 again ruled our APS.  --Labs from 05/20/2022 showed mild protein C and S levels which is consistent with coumadin use. --No  further intervention recommended.   #Iron deficiency anemia:  --Received IV monoferric 1000 mg x 1 dose on 06/02/2022 --Labs today show anemia has resolved with a hemoglobin of 13.7, MCV 85.4.  Iron panel shows improvement with serum iron 46, TIBC 262, iron saturation 19%, ferritin levels pending.  -- No need for additional IV iron at this time. -- Etiology unknown as patient denies any signs of bleeding including menstrual bleeding, hematochezia or melena.  We will make referral to gastroenterology to further evaluate underlying cause.  Follow up: --3 months: labs and follow up visit.   No orders of the defined types were placed in this encounter.   All questions were answered. The patient knows to call the clinic with any problems, questions or concerns.  I have spent a total of 25 minutes minutes of face-to-face and non-face-to-face time, preparing to see the patient, performing a medically appropriate examination, counseling and educating the patient, referring and communicating with other health care professionals, documenting clinical information in the electronic health record, and care coordination.    Dede Query, PA-C Department of Hematology/Oncology [place] at [facility] Phone: [phone]

## 2022-07-28 ENCOUNTER — Telehealth: Payer: Self-pay

## 2022-07-28 NOTE — Telephone Encounter (Signed)
Pt advised with VU.  She does not take oral iron due to the severity of the constipation.

## 2022-07-28 NOTE — Telephone Encounter (Signed)
-----   Message from Lincoln Brigham, PA-C sent at 07/28/2022  2:14 PM EDT ----- Please notify patient iron deficiency anemia has resolved. No need for additional IV iron at this time. Recommend to continue OTC iron pills.

## 2022-07-31 DIAGNOSIS — R21 Rash and other nonspecific skin eruption: Secondary | ICD-10-CM | POA: Diagnosis not present

## 2022-07-31 DIAGNOSIS — Z86718 Personal history of other venous thrombosis and embolism: Secondary | ICD-10-CM | POA: Diagnosis not present

## 2022-07-31 DIAGNOSIS — N1831 Chronic kidney disease, stage 3a: Secondary | ICD-10-CM | POA: Diagnosis not present

## 2022-07-31 DIAGNOSIS — M549 Dorsalgia, unspecified: Secondary | ICD-10-CM | POA: Diagnosis not present

## 2022-07-31 DIAGNOSIS — M329 Systemic lupus erythematosus, unspecified: Secondary | ICD-10-CM | POA: Diagnosis not present

## 2022-07-31 DIAGNOSIS — M0609 Rheumatoid arthritis without rheumatoid factor, multiple sites: Secondary | ICD-10-CM | POA: Diagnosis not present

## 2022-07-31 DIAGNOSIS — M797 Fibromyalgia: Secondary | ICD-10-CM | POA: Diagnosis not present

## 2022-07-31 DIAGNOSIS — M1990 Unspecified osteoarthritis, unspecified site: Secondary | ICD-10-CM | POA: Diagnosis not present

## 2022-07-31 DIAGNOSIS — M2550 Pain in unspecified joint: Secondary | ICD-10-CM | POA: Diagnosis not present

## 2022-08-06 ENCOUNTER — Ambulatory Visit (HOSPITAL_BASED_OUTPATIENT_CLINIC_OR_DEPARTMENT_OTHER): Payer: Medicare Other | Admitting: Psychiatry

## 2022-08-06 ENCOUNTER — Other Ambulatory Visit (HOSPITAL_COMMUNITY): Payer: Self-pay | Admitting: Psychiatry

## 2022-08-06 DIAGNOSIS — F3341 Major depressive disorder, recurrent, in partial remission: Secondary | ICD-10-CM | POA: Diagnosis not present

## 2022-08-06 MED ORDER — BUPROPION HCL ER (XL) 300 MG PO TB24: ORAL_TABLET | ORAL | 5 refills | Status: DC

## 2022-08-06 MED ORDER — ALPRAZOLAM 1 MG PO TABS: 1.0000 mg | ORAL_TABLET | Freq: Two times a day (BID) | ORAL | 4 refills | Status: DC

## 2022-08-06 MED ORDER — ZOLPIDEM TARTRATE ER 12.5 MG PO TBCR: 12.5000 mg | EXTENDED_RELEASE_TABLET | Freq: Every day | ORAL | 4 refills | Status: DC

## 2022-08-06 NOTE — Progress Notes (Signed)
Psychiatric Initial Adult Assessment   Patient Identification: Lauren Chung MRN:  [id number] Date of Evaluation:  08/06/2022 Referral Source [facility] Chief Complaint:    Visit Diagnosis:       Today the patient is seen in person.  This is that she is doing better.  She was very isolated and not been seen in many months.  Since I spoke to her last she started to try to get the car and is gone now.  She drove here to see me today.  Patient's mood is fairly stable.  She actually is doing better than she admits.  She is taking classes classes for digital marketing and even has a conference is scheduled for her in [place].  She does plan to go.  He is going to come down from the [place] and help her go.  The patient has to take care of her autistic son and she has arranged somebody to come and take care of him.  The patient denies persistent depression she is enjoys TV she started reading.  She sleeps and eats well.  She has active treatment for her lupus.  Her lupus seems to affect her kidneys more than anything.  The patient still is not very social at all.  Her daughter calls her every day multiple times which bothers the patient.  Nonetheless it is good that the patient is taking classes.  I think the patient is doing better. Virtual Visit via Telephone Note  I connected with [name] on 08/06/22 at  4:00 PM EDT by telephone and verified that I am speaking with the correct person using two identifiers.  Location: Patient: home Provider: office   I discussed the limitations, risks, security and privacy concerns of performing an evaluation and management service by telephone and the availability of in person appointments. I also discussed with the patient that there may be a patient responsible charge related to this service. The patient expressed understanding and agreed to proceed.      I discussed the assessment and treatment plan with the patient. The patient was provided an  opportunity to ask questions and all were answered. The patient agreed with the plan and demonstrated an understanding of the instructions.   The patient was advised to call back or seek an in-person evaluation if the symptoms worsen or if the condition fails to improve as anticipated.  I provided 25 minutes of non-face-to-face time during this encounter.   Jerral Ralph, MD    Anxiety Symptoms:   Psychotic Symptoms:   PTSD Symptoms:   Past Psychiatric History: Past therapy, one psychiatric hospitalization  Previous Psychotropic Medications: Yes   Substance Abuse History in the last 12 months:  No.  Consequences of Substance Abuse: Negative  Past Medical History:  Past Medical History:  Diagnosis Date   Anemia    Anxiety    Depression    History of blood transfusion    in [place]   Hyperlipidemia    Hypertension    Lupus ([place])    PE (pulmonary embolism)    Pneumonia    x 3 - last time 2017   PTSD (post-traumatic stress disorder)    Stroke ([place])    many years ago per patient, no deficits   SVD (spontaneous vaginal delivery)    x 3   TIA (transient ischemic attack)    many years ago per patient, no deficits    Past Surgical History:  Procedure Laterality Date   ABDOMINAL SURGERY  due internal bleeding   ABLATION     gyn procedure for bleeding   CYST EXCISION     top of head   EXCISION MASS HEAD N/A 08/05/2019   Procedure: EXCISION OF SCALP CYST;  Surgeon: Izora Gala, MD;  Location: [facility];  Service: ENT;  Laterality: N/A;    Family Psychiatric History:   Family History:  Family History  Problem Relation Age of Onset   Drug abuse Mother    Alcohol abuse Mother    Leukemia Mother    Leukemia Father    Seizures Daughter     Social History:   Social History   Socioeconomic History   Marital status: Single    Spouse name: Not on file   Number of children: 3   Years of education: Not on file   Highest education level: Not on  file  Occupational History   Not on file  Tobacco Use   Smoking status: Never   Smokeless tobacco: Never  Vaping Use   Vaping Use: Never used  Substance and Sexual Activity   Alcohol use: No   Drug use: No   Sexual activity: Yes    Partners: Male    Birth control/protection: Condom, Post-menopausal  Other Topics Concern   Not on file  Social History Narrative   R handed   Live with son and 3 dogs   Two story   No Caffeine   Social Determinants of Health   Financial Resource Strain: Not on file  Food Insecurity: Not on file  Transportation Needs: Not on file  Physical Activity: Not on file  Stress: Not on file  Social Connections: Not on file    Additional Social History:   Allergies:   Allergies  Allergen Reactions   Codeine Shortness Of Breath   Contrast Media [Iodinated Contrast Media] Shortness Of Breath   Tylenol [Acetaminophen] Nausea And Vomiting    Metabolic Disorder Labs: Lab Results  Component Value Date   HGBA1C 5.0 03/05/2022   MPG 96.8 03/05/2022   No results found for: "PROLACTIN" Lab Results  Component Value Date   CHOL 109 03/06/2022   TRIG 75 03/06/2022   HDL 42 03/06/2022   CHOLHDL 2.6 03/06/2022   VLDL 15 03/06/2022   LDLCALC 52 03/06/2022     Current Medications: Current Outpatient Medications  Medication Sig Dispense Refill   ALPRAZolam (XANAX) 1 MG tablet Take 1 tablet (1 mg total) by mouth 2 (two) times daily. 90 tablet 4   buPROPion (WELLBUTRIN XL) 300 MG 24 hr tablet 3  qam 90 tablet 5   hydroxychloroquine (PLAQUENIL) 200 MG tablet Take 200 mg by mouth daily.     leflunomide (ARAVA) 10 MG tablet Take 10 mg by mouth daily.     LINZESS 72 MCG capsule Take 72 mcg by mouth daily as needed (constipation).     metoprolol succinate (TOPROL-XL) 25 MG 24 hr tablet Take 1 tablet (25 mg total) by mouth daily. TAKE 1 TABLET (25 MG TOTAL) BY MOUTH IN THE MORNING. 90 tablet 1   predniSONE (DELTASONE) 5 MG tablet Take 5 mg by mouth daily  with breakfast.     rosuvastatin (CRESTOR) 10 MG tablet Take 10 mg by mouth in the morning.     warfarin (COUMADIN) 2 MG tablet Take 2-3 mg by mouth as directed. Take 2 mg on (Tuesday and Thursday) and Take 3 mg all other days     zolpidem (AMBIEN CR) 12.5 MG CR tablet Take 1  tablet (12.5 mg total) by mouth at bedtime. 30 tablet 4   No current facility-administered medications for this visit.    Neurologic: Headache: No Seizure: No Paresthesias:No  Musculoskeletal: Strength & Muscle Tone: within normal limits Gait & Station: normal Patient leans: N/A  Psychiatric Specialty Exam: ROS  There were no vitals taken for this visit.There is no height or weight on file to calculate BMI.  General Appearance: Casual  Eye Contact:  Good  Speech:  Clear and Coherent  Volume:  Normal  Mood:  Dysphoric  Affect:  Appropriate  Thought Process:  Goal Directed  Orientation:  NA  Thought Content:  Logical  Suicidal Thoughts:  No  Homicidal Thoughts:  No  Memory:  NA  Judgement:  Good  Insight:  Good  Psychomotor Activity:  Normal  Concentration:    Recall:  [place] of Knowledge:Fair  Language: Good  Akathisia:  No  Handed:  Right  AIMS (if indicated):    Assets:  Desire for Improvement  ADL's:  Intact  Cognition: WNL  Sleep:      Treatment Plan Summary: 10/4/20234:23 PM    This patient's diagnosis is major depression.  She will continue taking Wellbutrin 450 mg.  Her diagnosis is generalized anxiety disorder.  She will continue taking Xanax 1 mg 1 in the morning and 2 at night.  The patient is not a candidate for therapy.  She is doing well to get out as she is.  She will return to see me in 2 to 3 months.  Think she is improved.

## 2022-08-14 DIAGNOSIS — M0609 Rheumatoid arthritis without rheumatoid factor, multiple sites: Secondary | ICD-10-CM | POA: Diagnosis not present

## 2022-08-14 DIAGNOSIS — Z1159 Encounter for screening for other viral diseases: Secondary | ICD-10-CM | POA: Diagnosis not present

## 2022-08-29 DIAGNOSIS — Z86711 Personal history of pulmonary embolism: Secondary | ICD-10-CM | POA: Diagnosis not present

## 2022-08-29 DIAGNOSIS — Z7901 Long term (current) use of anticoagulants: Secondary | ICD-10-CM | POA: Diagnosis not present

## 2022-09-04 DIAGNOSIS — M0609 Rheumatoid arthritis without rheumatoid factor, multiple sites: Secondary | ICD-10-CM | POA: Diagnosis not present

## 2022-09-18 ENCOUNTER — Ambulatory Visit: Payer: Medicare Other | Admitting: Physician Assistant

## 2022-10-02 DIAGNOSIS — M0609 Rheumatoid arthritis without rheumatoid factor, multiple sites: Secondary | ICD-10-CM | POA: Diagnosis not present

## 2022-10-06 ENCOUNTER — Other Ambulatory Visit: Payer: Self-pay | Admitting: Physician Assistant

## 2022-10-06 DIAGNOSIS — D508 Other iron deficiency anemias: Secondary | ICD-10-CM

## 2022-10-07 ENCOUNTER — Other Ambulatory Visit: Payer: Self-pay

## 2022-10-07 ENCOUNTER — Inpatient Hospital Stay: Payer: Medicare Other | Attending: Physician Assistant

## 2022-10-07 ENCOUNTER — Inpatient Hospital Stay: Payer: Medicare Other | Admitting: Physician Assistant

## 2022-10-07 DIAGNOSIS — D509 Iron deficiency anemia, unspecified: Secondary | ICD-10-CM | POA: Diagnosis not present

## 2022-10-07 DIAGNOSIS — D508 Other iron deficiency anemias: Secondary | ICD-10-CM

## 2022-10-07 DIAGNOSIS — Z86711 Personal history of pulmonary embolism: Secondary | ICD-10-CM | POA: Insufficient documentation

## 2022-10-07 LAB — CMP (CANCER CENTER ONLY)
ALT: 26 U/L (ref 0–44)
AST: 28 U/L (ref 15–41)
Albumin: 4.1 g/dL (ref 3.5–5.0)
Alkaline Phosphatase: 76 U/L (ref 38–126)
Anion gap: 6 (ref 5–15)
BUN: 11 mg/dL (ref 6–20)
CO2: 29 mmol/L (ref 22–32)
Calcium: 9.3 mg/dL (ref 8.9–10.3)
Chloride: 105 mmol/L (ref 98–111)
Creatinine: 1.26 mg/dL — ABNORMAL HIGH (ref 0.44–1.00)
GFR, Estimated: 50 mL/min — ABNORMAL LOW (ref >60)
Glucose, Bld: 80 mg/dL (ref 70–99)
Potassium: 3.4 mmol/L — ABNORMAL LOW (ref 3.5–5.1)
Sodium: 140 mmol/L (ref 135–145)
Total Bilirubin: 0.5 mg/dL (ref 0.3–1.2)
Total Protein: 7 g/dL (ref 6.5–8.1)

## 2022-10-07 LAB — CBC WITH DIFFERENTIAL (CANCER CENTER ONLY)
Abs Immature Granulocytes: 0.02 10*3/uL (ref 0.00–0.07)
Basophils Absolute: 0 10*3/uL (ref 0.0–0.1)
Basophils Relative: 1 %
Eosinophils Absolute: 0.1 10*3/uL (ref 0.0–0.5)
Eosinophils Relative: 1 %
HCT: 43.1 % (ref 36.0–46.0)
Hemoglobin: 14.7 g/dL (ref 12.0–15.0)
Immature Granulocytes: 0 %
Lymphocytes Relative: 23 %
Lymphs Abs: 1.8 10*3/uL (ref 0.7–4.0)
MCH: 30.8 pg (ref 26.0–34.0)
MCHC: 34.1 g/dL (ref 30.0–36.0)
MCV: 90.4 fL (ref 80.0–100.0)
Monocytes Absolute: 0.4 10*3/uL (ref 0.1–1.0)
Monocytes Relative: 5 %
Neutro Abs: 5.3 10*3/uL (ref 1.7–7.7)
Neutrophils Relative %: 70 %
Platelet Count: 225 10*3/uL (ref 150–400)
RBC: 4.77 MIL/uL (ref 3.87–5.11)
RDW: 13.3 % (ref 11.5–15.5)
WBC Count: 7.6 10*3/uL (ref 4.0–10.5)
nRBC: 0 % (ref 0.0–0.2)

## 2022-10-07 LAB — IRON AND IRON BINDING CAPACITY (CC-WL,HP ONLY)
Iron: 51 ug/dL (ref 28–170)
Saturation Ratios: 19 % (ref 10.4–31.8)
TIBC: 269 ug/dL (ref 250–450)
UIBC: 218 ug/dL (ref 148–442)

## 2022-10-08 LAB — FERRITIN: Ferritin: 45 ng/mL (ref 11–307)

## 2022-10-09 DIAGNOSIS — Z7901 Long term (current) use of anticoagulants: Secondary | ICD-10-CM | POA: Diagnosis not present

## 2022-10-09 DIAGNOSIS — Z86711 Personal history of pulmonary embolism: Secondary | ICD-10-CM | POA: Diagnosis not present

## 2022-10-18 ENCOUNTER — Other Ambulatory Visit (HOSPITAL_COMMUNITY): Payer: Self-pay | Admitting: Psychiatry

## 2022-10-18 ENCOUNTER — Other Ambulatory Visit: Payer: Self-pay | Admitting: Cardiology

## 2022-10-18 DIAGNOSIS — R002 Palpitations: Secondary | ICD-10-CM

## 2022-10-23 DIAGNOSIS — Z79899 Other long term (current) drug therapy: Secondary | ICD-10-CM | POA: Diagnosis not present

## 2022-10-23 DIAGNOSIS — N1831 Chronic kidney disease, stage 3a: Secondary | ICD-10-CM | POA: Diagnosis not present

## 2022-10-23 DIAGNOSIS — M0609 Rheumatoid arthritis without rheumatoid factor, multiple sites: Secondary | ICD-10-CM | POA: Diagnosis not present

## 2022-10-23 DIAGNOSIS — Z86718 Personal history of other venous thrombosis and embolism: Secondary | ICD-10-CM | POA: Diagnosis not present

## 2022-10-23 DIAGNOSIS — M329 Systemic lupus erythematosus, unspecified: Secondary | ICD-10-CM | POA: Diagnosis not present

## 2022-10-23 DIAGNOSIS — M1990 Unspecified osteoarthritis, unspecified site: Secondary | ICD-10-CM | POA: Diagnosis not present

## 2022-10-23 DIAGNOSIS — M797 Fibromyalgia: Secondary | ICD-10-CM | POA: Diagnosis not present

## 2022-10-30 DIAGNOSIS — M0609 Rheumatoid arthritis without rheumatoid factor, multiple sites: Secondary | ICD-10-CM | POA: Diagnosis not present

## 2022-11-06 DIAGNOSIS — Z7901 Long term (current) use of anticoagulants: Secondary | ICD-10-CM | POA: Diagnosis not present

## 2022-11-06 DIAGNOSIS — Z86711 Personal history of pulmonary embolism: Secondary | ICD-10-CM | POA: Diagnosis not present

## 2022-11-11 ENCOUNTER — Ambulatory Visit (HOSPITAL_COMMUNITY): Payer: Medicare Other | Admitting: Psychiatry

## 2022-11-19 ENCOUNTER — Ambulatory Visit (HOSPITAL_BASED_OUTPATIENT_CLINIC_OR_DEPARTMENT_OTHER): Payer: Medicare Other | Admitting: Psychiatry

## 2022-11-19 DIAGNOSIS — F324 Major depressive disorder, single episode, in partial remission: Secondary | ICD-10-CM

## 2022-11-19 MED ORDER — ZOLPIDEM TARTRATE ER 12.5 MG PO TBCR: 12.5000 mg | EXTENDED_RELEASE_TABLET | Freq: Every day | ORAL | 4 refills | Status: DC

## 2022-11-19 MED ORDER — BUPROPION HCL ER (XL) 300 MG PO TB24: ORAL_TABLET | ORAL | 5 refills | Status: DC

## 2022-11-19 NOTE — Progress Notes (Signed)
Past Psychiatric Initial Adult Assessment   Patient Identification: Lauren Chung MRN:  [id number] Date of Evaluation:  11/19/2022 Referral Source Lauren Chung Chief Complaint:    Visit Diagnosis:    Today the patient is seen in the office.  She seems to be pretty stable but she has a lot of stresses.  The biggest one is her son Lauren Chung who lives with her.  He apparently has on the spectrum and is diagnosed now with anorexia nervosa.  He sees a psychiatrist in [place] who is talking about that if he does not start eating will be hospitalized.  The patient is very scared of this.  She gets along fairly well with Lauren Chung.  Patient has daughters who make contact with her on a regular basis.  The patient continues to drive but very little.  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  I suspect this is to try to get her more marketable.  The patient is ambivalent about a job.  Patient also is complicated by having lupus.  She has had some complications.  She has kidney disease but it seems to be relatively stable.  Her rheumatologist is thinking about giving her an infusion for her lupus.  The patient shares that she has a history of being married in [place] got divorced and moved to [place] to be closer to her daughters.  The patient does watch TV and actually isolates herself too much.  She barely goes out.  Lauren Chung her son wants her to be with her all the time.  The patient has no social life.  The patient is not in therapy.  She is very resistant to the idea.  This patient will be seen again in 7 weeks. Virtual Visit via Telephone Note  I connected with Lauren Chung on 11/19/22 at  4:00 PM EST by telephone and verified that I am speaking with the correct person using two identifiers.  Location: Patient: home Provider: office   I discussed the limitations, risks, security and privacy concerns of performing an evaluation and management service by  telephone and the availability of in person appointments. I also discussed with the patient that there may be a patient responsible charge related to this service. The patient expressed understanding and agreed to proceed.      I discussed the assessment and treatment plan with the patient. The patient was provided an opportunity to ask questions and all were answered. The patient agreed with the plan and demonstrated an understanding of the instructions.   The patient was advised to call back or seek an in-person evaluation if the symptoms worsen or if the condition fails to improve as anticipated.  I provided 25 minutes of non-face-to-face time during this encounter.   Lauren Ralph, MD    Anxiety Symptoms:   Psychotic Symptoms:   PTSD Symptoms:   Past Psychiatric History: Past therapy, one psychiatric hospitalization  Previous Psychotropic Medications: Yes   Substance Abuse History in the last 12 months:  No.  Consequences of Substance Abuse: Negative  Past Medical History:  Past Medical History:  Diagnosis Date   Anemia    Anxiety    Depression    History of blood transfusion    in [place]   Hyperlipidemia    Hypertension    Lupus ([place])    PE (pulmonary embolism)    Pneumonia    x 3 - last time 2017   PTSD (post-traumatic  stress disorder)    Stroke [facility])    many years ago per patient, no deficits   SVD (spontaneous vaginal delivery)    x 3   TIA (transient ischemic attack)    many years ago per patient, no deficits    Past Surgical History:  Procedure Laterality Date   ABDOMINAL SURGERY     due internal bleeding   ABLATION     gyn procedure for bleeding   CYST EXCISION     top of head   EXCISION MASS HEAD N/A 08/05/2019   Procedure: EXCISION OF SCALP CYST;  Surgeon: Lauren Gala, MD;  Location: [place];  Service: ENT;  Laterality: N/A;    Family Psychiatric History:   Family History:  Family History  Problem Relation Age of Onset    Drug abuse Mother    Alcohol abuse Mother    Leukemia Mother    Leukemia Father    Seizures Daughter     Social History:   Social History   Socioeconomic History   Marital status: Single    Spouse name: Not on file   Number of children: 3   Years of education: Not on file   Highest education level: Not on file  Occupational History   Not on file  Tobacco Use   Smoking status: Never   Smokeless tobacco: Never  Vaping Use   Vaping Use: Never used  Substance and Sexual Activity   Alcohol use: No   Drug use: No   Sexual activity: Yes    Partners: Male    Birth control/protection: Condom, Post-menopausal  Other Topics Concern   Not on file  Social History Narrative   R handed   Live with son and 3 dogs   Two story   No Caffeine   Social Determinants of Health   Financial Resource Strain: Not on file  Food Insecurity: Not on file  Transportation Needs: Not on file  Physical Activity: Not on file  Stress: Not on file  Social Connections: Not on file    Additional Social History:   Allergies:   Allergies  Allergen Reactions   Codeine Shortness Of Breath   Contrast Media [Iodinated Contrast Media] Shortness Of Breath   Tylenol [Acetaminophen] Nausea And Vomiting    Metabolic Disorder Labs: Lab Results  Component Value Date   HGBA1C 5.0 03/05/2022   MPG 96.8 03/05/2022   No results found for: "PROLACTIN" Lab Results  Component Value Date   CHOL 109 03/06/2022   TRIG 75 03/06/2022   HDL 42 03/06/2022   CHOLHDL 2.6 03/06/2022   VLDL 15 03/06/2022   LDLCALC 52 03/06/2022     Current Medications: Current Outpatient Medications  Medication Sig Dispense Refill   ALPRAZolam (XANAX) 1 MG tablet Take 1 tablet (1 mg total) by mouth 2 (two) times daily. 90 tablet 4   buPROPion (WELLBUTRIN XL) 300 MG 24 hr tablet 3  qam 90 tablet 5   hydroxychloroquine (PLAQUENIL) 200 MG tablet Take 200 mg by mouth daily.     leflunomide (ARAVA) 10 MG tablet Take 10 mg by  mouth daily.     LINZESS 72 MCG capsule Take 72 mcg by mouth daily as needed (constipation).     metoprolol succinate (TOPROL-XL) 25 MG 24 hr tablet TAKE 1 TABLET (25 MG TOTAL) BY MOUTH IN THE MORNING. 90 tablet 1   predniSONE (DELTASONE) 5 MG tablet Take 5 mg by mouth daily with breakfast.     rosuvastatin (CRESTOR) 10  MG tablet Take 10 mg by mouth in the morning.     warfarin (COUMADIN) 2 MG tablet Take 2-3 mg by mouth as directed. Take 2 mg on (Tuesday and Thursday) and Take 3 mg all other days     zolpidem (AMBIEN CR) 12.5 MG CR tablet Take 1 tablet (12.5 mg total) by mouth at bedtime. 30 tablet 4   No current facility-administered medications for this visit.    Neurologic: Headache: No Seizure: No Paresthesias:No  Musculoskeletal: Strength & Muscle Tone: within normal limits Gait & Station: normal Patient leans: N/A  Psychiatric Specialty Exam: ROS  There were no vitals taken for this visit.There is no height or weight on file to calculate BMI.  General Appearance: Casual  Eye Contact:  Good  Speech:  Clear and Coherent  Volume:  Normal  Mood:  Dysphoric  Affect:  Appropriate  Thought Process:  Goal Directed  Orientation:  NA  Thought Content:  Logical  Suicidal Thoughts:  No  Homicidal Thoughts:  No  Memory:  NA  Judgement:  Good  Insight:  Good  Psychomotor Activity:  Normal  Concentration:    Recall:  [place] of Knowledge:Fair  Language: Good  Akathisia:  No  Handed:  Right  AIMS (if indicated):    Assets:  Desire for Improvement  ADL's:  Intact  Cognition: WNL  Sleep:      Treatment Plan Summary: 1/17/20244:31 PM     This patient's diagnosis is major depression.  She takes Wellbutrin 450 mg.  Her second problem is generalized anxiety disorder.  She takes Xanax 1 mg in the morning and 2 at night.  I suspect things are going to change in the next month or 2 with her son.  This patient will return to see me in 6 to 7 weeks and we will reevaluate her  condition.  I think Wellbutrin has been helpful for her.  I do not want to change her Xanax at this time.  Patient is not suicidal.  She drinks no alcohol and uses no drugs.  In some ways she is stable.  The patient also has insomnia and takes Ambien on a regular basis.  She actually sleeps and eats pretty well.

## 2022-11-21 ENCOUNTER — Encounter: Payer: Self-pay | Admitting: Physician Assistant

## 2022-12-01 DIAGNOSIS — F339 Major depressive disorder, recurrent, unspecified: Secondary | ICD-10-CM | POA: Diagnosis not present

## 2022-12-01 DIAGNOSIS — Z8673 Personal history of transient ischemic attack (TIA), and cerebral infarction without residual deficits: Secondary | ICD-10-CM | POA: Diagnosis not present

## 2022-12-01 DIAGNOSIS — Z23 Encounter for immunization: Secondary | ICD-10-CM | POA: Diagnosis not present

## 2022-12-01 DIAGNOSIS — I10 Essential (primary) hypertension: Secondary | ICD-10-CM | POA: Diagnosis not present

## 2022-12-01 DIAGNOSIS — D649 Anemia, unspecified: Secondary | ICD-10-CM | POA: Diagnosis not present

## 2022-12-01 DIAGNOSIS — M329 Systemic lupus erythematosus, unspecified: Secondary | ICD-10-CM | POA: Diagnosis not present

## 2022-12-01 DIAGNOSIS — Z86711 Personal history of pulmonary embolism: Secondary | ICD-10-CM | POA: Diagnosis not present

## 2022-12-01 DIAGNOSIS — N1832 Chronic kidney disease, stage 3b: Secondary | ICD-10-CM | POA: Diagnosis not present

## 2022-12-01 DIAGNOSIS — M069 Rheumatoid arthritis, unspecified: Secondary | ICD-10-CM | POA: Diagnosis not present

## 2022-12-01 DIAGNOSIS — K5900 Constipation, unspecified: Secondary | ICD-10-CM | POA: Diagnosis not present

## 2022-12-01 DIAGNOSIS — E7800 Pure hypercholesterolemia, unspecified: Secondary | ICD-10-CM | POA: Diagnosis not present

## 2022-12-01 DIAGNOSIS — Z0000 Encounter for general adult medical examination without abnormal findings: Secondary | ICD-10-CM | POA: Diagnosis not present

## 2022-12-08 DIAGNOSIS — N1831 Chronic kidney disease, stage 3a: Secondary | ICD-10-CM | POA: Diagnosis not present

## 2022-12-08 DIAGNOSIS — M329 Systemic lupus erythematosus, unspecified: Secondary | ICD-10-CM | POA: Diagnosis not present

## 2022-12-08 DIAGNOSIS — M1990 Unspecified osteoarthritis, unspecified site: Secondary | ICD-10-CM | POA: Diagnosis not present

## 2022-12-08 DIAGNOSIS — M797 Fibromyalgia: Secondary | ICD-10-CM | POA: Diagnosis not present

## 2022-12-08 DIAGNOSIS — Z86718 Personal history of other venous thrombosis and embolism: Secondary | ICD-10-CM | POA: Diagnosis not present

## 2022-12-08 DIAGNOSIS — Z79899 Other long term (current) drug therapy: Secondary | ICD-10-CM | POA: Diagnosis not present

## 2022-12-08 DIAGNOSIS — M0609 Rheumatoid arthritis without rheumatoid factor, multiple sites: Secondary | ICD-10-CM | POA: Diagnosis not present

## 2022-12-18 ENCOUNTER — Other Ambulatory Visit (HOSPITAL_COMMUNITY): Payer: Self-pay | Admitting: Psychiatry

## 2022-12-22 ENCOUNTER — Encounter: Payer: Self-pay | Admitting: Physician Assistant

## 2023-01-02 DIAGNOSIS — Z7901 Long term (current) use of anticoagulants: Secondary | ICD-10-CM | POA: Diagnosis not present

## 2023-01-02 DIAGNOSIS — D473 Essential (hemorrhagic) thrombocythemia: Secondary | ICD-10-CM | POA: Diagnosis not present

## 2023-02-04 ENCOUNTER — Ambulatory Visit (HOSPITAL_COMMUNITY): Payer: Medicare Other | Admitting: Psychiatry

## 2023-02-12 DIAGNOSIS — M0609 Rheumatoid arthritis without rheumatoid factor, multiple sites: Secondary | ICD-10-CM | POA: Diagnosis not present

## 2023-02-23 DIAGNOSIS — D849 Immunodeficiency, unspecified: Secondary | ICD-10-CM | POA: Diagnosis not present

## 2023-02-23 DIAGNOSIS — M329 Systemic lupus erythematosus, unspecified: Secondary | ICD-10-CM | POA: Diagnosis not present

## 2023-02-23 DIAGNOSIS — Z86711 Personal history of pulmonary embolism: Secondary | ICD-10-CM | POA: Diagnosis not present

## 2023-02-23 DIAGNOSIS — R5383 Other fatigue: Secondary | ICD-10-CM | POA: Diagnosis not present

## 2023-02-23 DIAGNOSIS — Z7901 Long term (current) use of anticoagulants: Secondary | ICD-10-CM | POA: Diagnosis not present

## 2023-02-23 DIAGNOSIS — Z7960 Long term (current) use of unspecified immunomodulators and immunosuppressants: Secondary | ICD-10-CM | POA: Diagnosis not present

## 2023-03-23 DIAGNOSIS — Z7901 Long term (current) use of anticoagulants: Secondary | ICD-10-CM | POA: Diagnosis not present

## 2023-04-07 DIAGNOSIS — Z7901 Long term (current) use of anticoagulants: Secondary | ICD-10-CM | POA: Diagnosis not present

## 2023-04-08 ENCOUNTER — Other Ambulatory Visit: Payer: Self-pay

## 2023-04-08 ENCOUNTER — Encounter (HOSPITAL_COMMUNITY): Payer: Self-pay | Admitting: Psychiatry

## 2023-04-08 ENCOUNTER — Ambulatory Visit (HOSPITAL_BASED_OUTPATIENT_CLINIC_OR_DEPARTMENT_OTHER): Payer: 59 | Admitting: Psychiatry

## 2023-04-08 VITALS — BP 139/84 | HR 88 | Ht 68.0 in | Wt 165.0 lb

## 2023-04-08 DIAGNOSIS — F325 Major depressive disorder, single episode, in full remission: Secondary | ICD-10-CM

## 2023-04-08 MED ORDER — BUPROPION HCL ER (XL) 300 MG PO TB24: ORAL_TABLET | ORAL | 5 refills | Status: DC

## 2023-04-08 MED ORDER — ALPRAZOLAM 1 MG PO TABS: 1.0000 mg | ORAL_TABLET | Freq: Two times a day (BID) | ORAL | 4 refills | Status: DC

## 2023-04-08 MED ORDER — ZOLPIDEM TARTRATE ER 12.5 MG PO TBCR: 12.5000 mg | EXTENDED_RELEASE_TABLET | Freq: Every day | ORAL | 4 refills | Status: DC

## 2023-04-08 NOTE — Progress Notes (Signed)
Past Psychiatric Initial Adult Assessment   Patient Identification: Lauren Chung MRN:  [id number] Date of Evaluation:  04/08/2023 Referral Source [facility] Chief Complaint:    Visit Diagnosis:     Today the patient is doing reasonably well.  Her mood is good.  Fortunately her son Riki Rusk started eating again in the hospital.  Patient has 2 daughters.  One of her daughters is just graduating from [employer].  Her other daughter lives near her.  She is very happy with them.  The patient's health is reasonably good.  She has lupus and she is starting to get some IV infusions for this condition.  He does not complain very much about physical symptoms.  The patient likes being by herself.  She has 3 dogs that she loves.  Financially she is very stable.  She watches some TV keeps busy but has no interest in the social life.  She takes care of her home well.  She feels anxious when she gets out of her house because she likes to be alone and spends a lot of time caring for Riki Rusk her son.  The patient takes her medicines as prescribed.  She denies any use of alcohol.  In fact she has never had any alcohol in her life.  Virtual Visit via Telephone Note  I connected with Lauren Chung on 04/08/23 at  3:00 PM EDT by telephone and verified that I am speaking with the correct person using two identifiers.  Location: Patient: home Provider: office   I discussed the limitations, risks, security and privacy concerns of performing an evaluation and management service by telephone and the availability of in person appointments. I also discussed with the patient that there may be a patient responsible charge related to this service. The patient expressed understanding and agreed to proceed.      I discussed the assessment and treatment plan with the patient. The patient was provided an opportunity to ask questions and all were answered. The patient agreed with the plan and demonstrated an  understanding of the instructions.   The patient was advised to call back or seek an in-person evaluation if the symptoms worsen or if the condition fails to improve as anticipated.  I provided 25 minutes of non-face-to-face time during this encounter.   Gypsy Balsam, MD    Anxiety Symptoms:   Psychotic Symptoms:   PTSD Symptoms:   Past Psychiatric History: Past therapy, one psychiatric hospitalization  Previous Psychotropic Medications: Yes   Substance Abuse History in the last 12 months:  No.  Consequences of Substance Abuse: Negative  Past Medical History:  Past Medical History:  Diagnosis Date   Anemia    Anxiety    Depression    History of blood transfusion    in [place]   Hyperlipidemia    Hypertension    Lupus ([facility])    PE (pulmonary embolism)    Pneumonia    x 3 - last time 2017   PTSD (post-traumatic stress disorder)    Stroke ([facility])    many years ago per patient, no deficits   SVD (spontaneous vaginal delivery)    x 3   TIA (transient ischemic attack)    many years ago per patient, no deficits    Past Surgical History:  Procedure Laterality Date   ABDOMINAL SURGERY     due internal bleeding   ABLATION     gyn procedure for bleeding   CYST EXCISION     top of  head   EXCISION MASS HEAD N/A 08/05/2019   Procedure: EXCISION OF SCALP CYST;  Surgeon: Serena Colonel, MD;  Location: [facility];  Service: ENT;  Laterality: N/A;    Family Psychiatric History:   Family History:  Family History  Problem Relation Age of Onset   Drug abuse Mother    Alcohol abuse Mother    Leukemia Mother    Leukemia Father    Seizures Daughter     Social History:   Social History   Socioeconomic History   Marital status: Single    Spouse name: Not on file   Number of children: 3   Years of education: Not on file   Highest education level: Not on file  Occupational History   Not on file  Tobacco Use   Smoking status: Never   Smokeless tobacco:  Never  Vaping Use   Vaping Use: Never used  Substance and Sexual Activity   Alcohol use: No   Drug use: No   Sexual activity: Yes    Partners: Male    Birth control/protection: Condom, Post-menopausal  Other Topics Concern   Not on file  Social History Narrative   R handed   Live with son and 3 dogs   Two story   No Caffeine   Social Determinants of Health   Financial Resource Strain: Not on file  Food Insecurity: Not on file  Transportation Needs: Not on file  Physical Activity: Not on file  Stress: Not on file  Social Connections: Not on file    Additional Social History:   Allergies:   Allergies  Allergen Reactions   Codeine Shortness Of Breath   Contrast Media [Iodinated Contrast Media] Shortness Of Breath   Tylenol [Acetaminophen] Nausea And Vomiting    Metabolic Disorder Labs: Lab Results  Component Value Date   HGBA1C 5.0 03/05/2022   MPG 96.8 03/05/2022   No results found for: "PROLACTIN" Lab Results  Component Value Date   CHOL 109 03/06/2022   TRIG 75 03/06/2022   HDL 42 03/06/2022   CHOLHDL 2.6 03/06/2022   VLDL 15 03/06/2022   LDLCALC 52 03/06/2022     Current Medications: Current Outpatient Medications  Medication Sig Dispense Refill   hydroxychloroquine (PLAQUENIL) 200 MG tablet Take 200 mg by mouth daily.     leflunomide (ARAVA) 10 MG tablet Take 10 mg by mouth daily.     metoprolol succinate (TOPROL-XL) 25 MG 24 hr tablet TAKE 1 TABLET (25 MG TOTAL) BY MOUTH IN THE MORNING. 90 tablet 1   predniSONE (DELTASONE) 5 MG tablet Take 5 mg by mouth daily with breakfast.     rosuvastatin (CRESTOR) 10 MG tablet Take 10 mg by mouth in the morning.     warfarin (COUMADIN) 2 MG tablet Take 2-3 mg by mouth as directed. Take 2 mg on (Tuesday and Thursday) and Take 3 mg all other days     ALPRAZolam (XANAX) 1 MG tablet Take 1 tablet (1 mg total) by mouth 2 (two) times daily. 90 tablet 4   buPROPion (WELLBUTRIN XL) 300 MG 24 hr tablet 3  qam 90 tablet 5    LINZESS 72 MCG capsule Take 72 mcg by mouth daily as needed (constipation). (Patient not taking: Reported on 04/08/2023)     zolpidem (AMBIEN CR) 12.5 MG CR tablet Take 1 tablet (12.5 mg total) by mouth at bedtime. 30 tablet 4   No current facility-administered medications for this visit.    Neurologic: Headache: No Seizure:  No Paresthesias:No  Musculoskeletal: Strength & Muscle Tone: within normal limits Gait & Station: normal Patient leans: N/A  Psychiatric Specialty Exam: ROS  Blood pressure 139/84, pulse 88, height 5\' 8"  (1.727 m), weight 165 lb (74.8 kg).Body mass index is 25.09 kg/m.  General Appearance: Casual  Eye Contact:  Good  Speech:  Clear and Coherent  Volume:  Normal  Mood:  Dysphoric  Affect:  Appropriate  Thought Process:  Goal Directed  Orientation:  NA  Thought Content:  Logical  Suicidal Thoughts:  No  Homicidal Thoughts:  No  Memory:  NA  Judgement:  Good  Insight:  Good  Psychomotor Activity:  Normal  Concentration:    Recall:  Fair  Fund of Knowledge:Fair  Language: Good  Akathisia:  No  Handed:  Right  AIMS (if indicated):    Assets:  Desire for Improvement  ADL's:  Intact  Cognition: WNL  Sleep:      Treatment Plan Summary: 6/5/20243:20 PM     Today the patient is doing well.  Her diagnosis is major depression.  She is doing well on Wellbutrin 450 mg.  She also has an adjustment disorder with an anxious mood state and takes 1 mg of Xanax 1 in the morning and 2 at night.  Her third problem is insomnia.  She takes Ambien 12.5 cc CR and gets a reasonably good night's sleep.  Generally she is sleeping and eating well.  This patient will return to see me in 4 months.  I believe she is stable and functioning extremely well.

## 2023-04-13 DIAGNOSIS — M1990 Unspecified osteoarthritis, unspecified site: Secondary | ICD-10-CM | POA: Diagnosis not present

## 2023-04-13 DIAGNOSIS — M810 Age-related osteoporosis without current pathological fracture: Secondary | ICD-10-CM | POA: Diagnosis not present

## 2023-04-13 DIAGNOSIS — N1831 Chronic kidney disease, stage 3a: Secondary | ICD-10-CM | POA: Diagnosis not present

## 2023-04-13 DIAGNOSIS — Z86718 Personal history of other venous thrombosis and embolism: Secondary | ICD-10-CM | POA: Diagnosis not present

## 2023-04-13 DIAGNOSIS — M0609 Rheumatoid arthritis without rheumatoid factor, multiple sites: Secondary | ICD-10-CM | POA: Diagnosis not present

## 2023-04-13 DIAGNOSIS — Z13820 Encounter for screening for osteoporosis: Secondary | ICD-10-CM | POA: Diagnosis not present

## 2023-04-13 DIAGNOSIS — M329 Systemic lupus erythematosus, unspecified: Secondary | ICD-10-CM | POA: Diagnosis not present

## 2023-04-13 DIAGNOSIS — M797 Fibromyalgia: Secondary | ICD-10-CM | POA: Diagnosis not present

## 2023-04-13 DIAGNOSIS — Z79899 Other long term (current) drug therapy: Secondary | ICD-10-CM | POA: Diagnosis not present

## 2023-04-14 ENCOUNTER — Telehealth (HOSPITAL_COMMUNITY): Payer: Self-pay

## 2023-04-14 ENCOUNTER — Other Ambulatory Visit (HOSPITAL_COMMUNITY): Payer: Self-pay

## 2023-04-14 NOTE — Telephone Encounter (Addendum)
Patients pharmacy reached out to me to let me know that her Zolpidem 12.5 was on backorder and they did not know when they would get more in. I called patient to see if there was another pharmacy we could send it to and she requested a change. Patient states she is having problems with her memory and would like to know if you can either go down on the Zolpidem dosage or switch her to a DORA medication. I found one, Quviviq, it is in our data base but not covered by her insurance so it will need a prior authorization. Please review and advise, thank you   04/20/2023 - This Clinical research associate spoke with Dr. Donell Beers and he provided a verbal order for Belsomra 20 mg 1 po at bedtime to be called into the pharmacy. This was done and I called the patient to let her know - RA

## 2023-04-20 ENCOUNTER — Ambulatory Visit: Payer: 59 | Admitting: Cardiology

## 2023-04-20 ENCOUNTER — Other Ambulatory Visit (HOSPITAL_COMMUNITY): Payer: Self-pay

## 2023-04-20 ENCOUNTER — Encounter: Payer: Self-pay | Admitting: Cardiology

## 2023-04-20 VITALS — BP 121/75 | HR 92 | Ht 68.0 in | Wt 163.2 lb

## 2023-04-20 DIAGNOSIS — Z86711 Personal history of pulmonary embolism: Secondary | ICD-10-CM

## 2023-04-20 DIAGNOSIS — N183 Chronic kidney disease, stage 3 unspecified: Secondary | ICD-10-CM | POA: Diagnosis not present

## 2023-04-20 DIAGNOSIS — R002 Palpitations: Secondary | ICD-10-CM | POA: Diagnosis not present

## 2023-04-20 DIAGNOSIS — Z8673 Personal history of transient ischemic attack (TIA), and cerebral infarction without residual deficits: Secondary | ICD-10-CM | POA: Diagnosis not present

## 2023-04-20 DIAGNOSIS — F325 Major depressive disorder, single episode, in full remission: Secondary | ICD-10-CM

## 2023-04-20 DIAGNOSIS — I129 Hypertensive chronic kidney disease with stage 1 through stage 4 chronic kidney disease, or unspecified chronic kidney disease: Secondary | ICD-10-CM | POA: Diagnosis not present

## 2023-04-20 MED ORDER — BELSOMRA 20 MG PO TABS: 30.0000 mg | ORAL_TABLET | Freq: Every evening | ORAL | 0 refills | Status: DC | PRN

## 2023-04-20 MED ORDER — BELSOMRA 20 MG PO TABS: 20.0000 mg | ORAL_TABLET | Freq: Every evening | ORAL | 0 refills | Status: DC | PRN

## 2023-04-20 NOTE — Progress Notes (Signed)
ID:  Lauren Chung, DOB [birth year], MRN [id number]  PCP:  Tally Joe, MD  Cardiologist:  Tessa Lerner, DO, [facility] (established care 06/13/2020)  Date: 04/20/23 Last Office Visit: April 18, 2022  Chief Complaint  Patient presents with   Palpitations   Follow-up    HPI  Lauren Chung is a 57 y.o. female whose past medical history and cardiovascular risk factors include: Hx of pulmonary embolism, hx of CVA /TIA, Lupus, HLD, HTN, CKD, iron deficiency anemia.   Patient was referred to the practice for evaluation of palpitations.  She is undergone appropriate workup.  She is also seen hematology and after iron infusions her symptoms of palpitations have improved significantly.  She presents today for 1 year follow-up visit.  Over the last 1 year she denies any anginal chest pain or heart failure symptoms.  No hospitalizations for cardiovascular reasons since last office encounter.  Patient has been evaluated by hematology oncology and has been ruled out for APS, factor V Leiden deficiency and prothrombin gene mutations based on the notes.   She continues to follow-up with PCP with regards to her INR checks.  Her last hospitalization for TIA was in May 2023.  FUNCTIONAL STATUS: No structured exercise program or daily routine.   ALLERGIES: Allergies  Allergen Reactions   Codeine Shortness Of Breath   Contrast Media [Iodinated Contrast Media] Shortness Of Breath   Tylenol [Acetaminophen] Nausea And Vomiting    MEDICATION LIST PRIOR TO VISIT: Current Meds  Medication Sig   ALPRAZolam (XANAX) 1 MG tablet Take 1 tablet (1 mg total) by mouth 2 (two) times daily.   buPROPion (WELLBUTRIN XL) 300 MG 24 hr tablet 3  qam   hydroxychloroquine (PLAQUENIL) 200 MG tablet Take 200 mg by mouth daily.   metoprolol succinate (TOPROL-XL) 25 MG 24 hr tablet TAKE 1 TABLET (25 MG TOTAL) BY MOUTH IN THE MORNING.   predniSONE (DELTASONE) 5 MG tablet Take 2.5 mg by mouth daily with breakfast.    rosuvastatin (CRESTOR) 10 MG tablet Take 10 mg by mouth in the morning.   warfarin (COUMADIN) 2 MG tablet Take 2-3 mg by mouth as directed. Take 2 mg on (Tuesday and Thursday) and Take 3 mg all other days     PAST MEDICAL HISTORY: Past Medical History:  Diagnosis Date   Anemia    Anxiety    Depression    History of blood transfusion    in [place]   Hyperlipidemia    Hypertension    Lupus (HCC)    PE (pulmonary embolism)    Pneumonia    x 3 - last time 2017   PTSD (post-traumatic stress disorder)    Stroke (HCC)    many years ago per patient, no deficits   SVD (spontaneous vaginal delivery)    x 3   TIA (transient ischemic attack)    many years ago per patient, no deficits    PAST SURGICAL HISTORY: Past Surgical History:  Procedure Laterality Date   ABDOMINAL SURGERY     due internal bleeding   ABLATION     gyn procedure for bleeding   CYST EXCISION     top of head   EXCISION MASS HEAD N/A 08/05/2019   Procedure: EXCISION OF SCALP CYST;  Surgeon: Serena Colonel, MD;  Location: [facility];  Service: ENT;  Laterality: N/A;    FAMILY HISTORY: The patient family history includes Alcohol abuse in her mother; Drug abuse in her mother; Leukemia in her father and mother;  Seizures in her daughter.  SOCIAL HISTORY:  The patient  reports that she has never smoked. She has never used smokeless tobacco. She reports that she does not drink alcohol and does not use drugs.  REVIEW OF SYSTEMS: Review of Systems  Cardiovascular:  Negative for chest pain, claudication, dyspnea on exertion, leg swelling, near-syncope, orthopnea, palpitations and syncope.  Respiratory:  Negative for shortness of breath.   Hematologic/Lymphatic: Negative for bleeding problem.  All other systems reviewed and are negative.   PHYSICAL EXAM:    04/20/2023    1:59 PM 04/08/2023    3:01 PM 07/22/2022    3:09 PM  Vitals with BMI  Height 5\' 8"   5\' 8"   Weight 163 lbs 3 oz  162 lbs 5 oz  BMI 24.82   24.68  Systolic 121  139  Diastolic 75  92  Pulse 92  64     Information is confidential and restricted. Go to Review Flowsheets to unlock data.   Physical Exam  Constitutional: No distress.  Age appropriate, hemodynamically stable.   Neck: No JVD present.  Cardiovascular: Normal rate, regular rhythm, S1 normal, S2 normal, intact distal pulses and normal pulses. Exam reveals no gallop, no S3 and no S4.  No murmur heard. Pulmonary/Chest: Effort normal and breath sounds normal. No stridor. She has no wheezes. She has no rales.  Abdominal: Soft. Bowel sounds are normal. She exhibits no distension. There is no abdominal tenderness.  Musculoskeletal:        General: No edema.     Cervical back: Neck supple.  Neurological: She is alert and oriented to person, place, and time. She has intact cranial nerves (2-12).  Skin: Skin is warm and moist.   CARDIAC DATABASE: EKG: April 20, 2023: Sinus rhythm, 75 bpm, without underlying ischemia or injury pattern.  Echocardiogram: 03/06/2022:  1. Left ventricular ejection fraction, by estimation, is 60 to 65%. The left ventricle has normal function. The left ventricle has no regional wall motion abnormalities. Left ventricular diastolic parameters were normal.   2. Right ventricular systolic function is normal. The right ventricular size is normal. There is normal pulmonary artery systolic pressure.   3. The mitral valve is normal in structure. No evidence of mitral valve regurgitation.   4. The aortic valve is normal in structure. Aortic valve regurgitation is not visualized.   5. The inferior vena cava is normal in size with greater than 50% respiratory variability, suggesting right atrial pressure of 3 mmHg.   Stress Testing: Exercise tetrofosmin stress test  06/25/2020:  Normal ECG stress. The patient exercised for 5 minutes and 29 seconds of a Bruce protocol, achieving approximately 6.64 METs.  Exercise capacity reduced for age. Normal BP  response.  Mild degree medium extent perfusion defect consistent with soft tissue attenuation in the apical inferior wall, mid inferior wall and basal inferior wall of left ventricle.  Overall LV systolic function is normal without regional wall motion abnormalities. Stress LV EF: 63%.  No previous exam available for comparison. Low risk study.   Heart Catheterization: None  7 day extended Holter monitor:  Dominant rhythm normal sinus.  Heart rate 66-154 bpm.  Average HR 96 bpm.  No atrial fibrillation/nonsustained ventricular tachycardia/high grade AV block, sinus pause greater than or equal to 3 seconds in duration.  Total ventricular ectopic burden <1%.  Total supraventricular ectopic burden <1%.  Number of patient triggered events: 9.  Predominantly underlying rhythm sinus tachycardia  (7 episodes) and 2 episodes of normal sinus  rhythm with rare VE.  Auto triggered event on 06/17/2020 at 7:49am patient had sinus rhythm with high degree AV block.  Auto triggered event on 06/15/2020 at 3:27am patient had sinus rhythm with second degree type I AV block.   LABORATORY DATA: External Labs: Collected: 05/21/2020 Creatinine 1.45 mg/dL. eGFR: 38 mL/min per 1.73 m Potassium 3.2, sodium 132, chloride 94, bicarb 25 Lipid profile: Total cholesterol 123, triglycerides 102, HDL 39, LDL 65, non-HDL 84 Hemoglobin: 10.6 g/dL Platelets 409W TSH: 1.19   LABORATORY DATA:    Latest Ref Rng & Units 10/07/2022    3:14 PM 07/22/2022    2:56 PM 05/27/2022   10:00 AM  CBC  WBC 4.0 - 10.5 K/uL 7.6  8.3  7.9   Hemoglobin 12.0 - 15.0 g/dL 14.7  82.9  9.5   Hematocrit 36.0 - 46.0 % 43.1  42.2  31.1   Platelets 150 - 400 K/uL 225  238  287        Latest Ref Rng & Units 10/07/2022    3:14 PM 07/22/2022    2:56 PM 05/27/2022   10:00 AM  CMP  Glucose 70 - 99 mg/dL 80  78  74   BUN 6 - 20 mg/dL 11  12  16    Creatinine 0.44 - 1.00 mg/dL 5.62  1.30  8.65   Sodium 135 - 145 mmol/L 140  140  137   Potassium  3.5 - 5.1 mmol/L 3.4  3.6  3.0   Chloride 98 - 111 mmol/L 105  105  105   CO2 22 - 32 mmol/L 29  29  26    Calcium 8.9 - 10.3 mg/dL 9.3  8.9  8.5   Total Protein 6.5 - 8.1 g/dL 7.0  7.1  7.0   Total Bilirubin 0.3 - 1.2 mg/dL 0.5  0.5  0.3   Alkaline Phos 38 - 126 U/L 76  69  58   AST 15 - 41 U/L 28  33  31   ALT 0 - 44 U/L 26  35  25     Lipid Panel     Component Value Date/Time   CHOL 109 03/06/2022 0402   TRIG 75 03/06/2022 0402   HDL 42 03/06/2022 0402   CHOLHDL 2.6 03/06/2022 0402   VLDL 15 03/06/2022 0402   LDLCALC 52 03/06/2022 0402    No components found for: "NTPROBNP" No results for input(s): "PROBNP" in the last 8760 hours. No results for input(s): "TSH" in the last 8760 hours.  BMP Recent Labs    05/27/22 1000 07/22/22 1456 10/07/22 1514  NA 137 140 140  K 3.0* 3.6 3.4*  CL 105 105 105  CO2 26 29 29   GLUCOSE 74 78 80  BUN 16 12 11   CREATININE 1.29* 1.33* 1.26*  CALCIUM 8.5* 8.9 9.3  GFRNONAA 49* 47* 50*    HEMOGLOBIN A1C Lab Results  Component Value Date   HGBA1C 5.0 03/05/2022   MPG 96.8 03/05/2022    Cardiac Panel (last 3 results) No results for input(s): "CKTOTAL", "CKMB", "TROPONINIHS", "RELINDX" in the last 72 hours.  CHOLESTEROL No results for input(s): "CHOL" in the last 8760 hours.   Hepatic Function Panel Recent Labs    05/27/22 1000 07/22/22 1456 10/07/22 1514  PROT 7.0 7.1 7.0  ALBUMIN 3.5 4.1 4.1  AST 31 33 28  ALT 25 35 26  ALKPHOS 58 69 76  BILITOT 0.3 0.5 0.5   Results Component Value Reference Range Notes  CBC with Diff  Reviewed date:12/01/2022 06:02:01 PM Interpretation: Performing Lab: Notes/Report: Testing Performed at: [facility], [address], [address], [place], [place] [address]  WBC 8.9 4.0-11.0 K/ul    RBC 4.69 4.20-5.40 M/uL    HGB 14.3 12.0-16.0 g/dL    HCT 96.2 95.2-84.1 %    MCV 92.4 81.0-99.0 fL    MCH 30.4 27.0-33.0 pg    MCHC 32.9 32.0-36.0 g/dL    RDW 32.4 40.1-02.7 %    PLT 222 150-400  K/uL    MPV 9.4 7.5-10.7 fL    NE% 71.8 43.3-71.9 %    LY% 21.8 16.8-43.5 %    MO% 5.2 4.6-12.4 %    EO% 0.7 0.0-7.8 %    BA% 0.5 0.0-1.0 %    NE# 6.4 1.9-7.2 K/uL    LY# 1.90 1.10-2.70 K/uL    MO# 0.5 0.3-0.8 K/uL    EO# 0.1 0.0-0.6 K/uL    BA# 0.0 0.0-0.1 K/uL    NRBC% 0.20      NRBC# 0.01      Comp Metabolic Panel Reviewed date:12/02/2022 10:53:44 AM Interpretation: Performing Lab: Notes/Report: Testing Performed at: [facility], [address], [address], [place], [place] [address]  Glucose 69 70-99 mg/dL    BUN 13 4-40 mg/dL    Creatinine 3.47 4.25-9.56 mg/dl    [id number] 48 >29 calc In accordance with recommendations from NKF-ASN Task Force, [facility] has updated its eGFR calc to the 2021 CKD-EDI equation that estimates kidney function without a race variable;Stage 1 > 90 ML/Min plus Albuminuria;Stage 2 60-89 ML/MIN;Stage 3 30-59 ML/MIN;Stage 4 15-29 ML/MIN;Stage 5 <15 ML/MIN  Sodium 141 136-145 mmol/L    Potassium 3.9 3.5-5.5 mmol/L    Chloride 103 98-107 mmol/L    CO2 27 22-32 mmol/L    Anion Gap 14.7 6.0-20.0 mmol/L    Calcium 9.2 8.6-10.3 mg/dL    CA-corrected 5.18 8.41-66.06 mg/dL    Protein, Total 6.9 3.0-1.6 g/dL    Albumin 4.2 0.1-0.9 g/dL    TBIL 0.6 3.2-3.5 mg/dL    ALP 61 57-322 U/L    AST 33 0-39 U/L    ALT 33 0-52 U/L    TSH Reviewed date:12/01/2022 06:02:09 PM Interpretation: Performing Lab: Notes/Report: Testing Performed at: [facility], [address], [address], [place], [place] [address]  TSH 2.76 0.34-4.50 UlU/mL    PT/INR in Office Reviewed date:12/03/2022 09:41:11 AM Interpretation:1.6 Performing Lab: Notes/Report: 1.6  Protime 19.4      INR 1.6 0.8 - 1.2    Lipid Panel w/reflex Reviewed date:12/02/2022 10:53:58 AM Interpretation: Performing Lab: Notes/Report: Testing Performed at: [facility], [address], [place], [place] [address]  Cholesterol 141 <200 mg/dL    CHOL/HDL 2.7 7.6-2.8 Ratio    HDLD 52 30-85 mg/dL Values  below 40 mg/dL indicate increased risk factor  Triglyceride 62 0-199 mg/dL    NHDL 88 3-151 mg/dL Range dependent upon risk factors.  LDL Chol Calc (NIH) 76 0-99 mg/dL      IMPRESSION:    VOH-60-VP   1. Palpitations  R00.2 EKG 12-Lead    2. Benign hypertension with CKD (chronic kidney disease) stage III (HCC)  I12.9    N18.30     3. Hx-TIA (transient ischemic attack)  Z86.73     4. History of CVA (cerebrovascular accident)  Z86.73     5. History of pulmonary embolism  Z86.711        RECOMMENDATIONS: Lauren Chung is a 57 y.o. female whose past medical history and cardiac risk factors  include: Hx of pulmonary embolism, hx of CVA, Lupus, HLD, HTN, CKD, iron deficiency anemia.   Patient presents today for 1 year follow-up visit.   Her overall palpitations are well-controlled and no new chest pain or heart failure symptoms that needs to be addressed.  Outside labs from [facility]where independently reviewed and noted above for further reference.  Today's ECG is nonischemic.  Prior echo and stress test results reviewed as part of medical decision making at today's office visit.  It appears that she has establish care with hematology oncology and after IV iron infusions her fatigue/palpitations have improved/resolved.  Over the last 2 office visit I have not made any meaningful changes to her chronic comorbid conditions.  And therefore I recommended her to follow-up on as-needed basis.  Educated her on the importance of improving her modifiable cardiovascular risk factors which include but not limited to glycemic control, lipid and blood pressure management, 30 minutes of moderate intensity exercise as tolerated 5 days a week, yearly physicals with PCP.  I will defer her back to PCP for now.  FINAL MEDICATION LIST END OF ENCOUNTER: No orders of the defined types were placed in this encounter.   Current Outpatient Medications:    ALPRAZolam (XANAX) 1 MG tablet, Take 1 tablet (1  mg total) by mouth 2 (two) times daily., Disp: 90 tablet, Rfl: 4   buPROPion (WELLBUTRIN XL) 300 MG 24 hr tablet, 3  qam, Disp: 90 tablet, Rfl: 5   hydroxychloroquine (PLAQUENIL) 200 MG tablet, Take 200 mg by mouth daily., Disp: , Rfl:    metoprolol succinate (TOPROL-XL) 25 MG 24 hr tablet, TAKE 1 TABLET (25 MG TOTAL) BY MOUTH IN THE MORNING., Disp: 90 tablet, Rfl: 1   predniSONE (DELTASONE) 5 MG tablet, Take 2.5 mg by mouth daily with breakfast., Disp: , Rfl:    rosuvastatin (CRESTOR) 10 MG tablet, Take 10 mg by mouth in the morning., Disp: , Rfl:    warfarin (COUMADIN) 2 MG tablet, Take 2-3 mg by mouth as directed. Take 2 mg on (Tuesday and Thursday) and Take 3 mg all other days, Disp: , Rfl:    Suvorexant (BELSOMRA) 20 MG TABS, Take 1 tablet (20 mg total) by mouth at bedtime as needed. (Patient not taking: Reported on 04/20/2023), Disp: 30 tablet, Rfl: 0  Orders Placed This Encounter  Procedures   EKG 12-Lead    There are no Patient Instructions on file for this visit.   --Continue cardiac medications as reconciled in final medication list. --Return if symptoms worsen or fail to improve. Or sooner if needed. --Continue follow-up with your primary care physician regarding the management of your other chronic comorbid conditions.  Patient's questions and concerns were addressed to her satisfaction. She voices understanding of the instructions provided during this encounter.   This note was created using a voice recognition software as a result there may be grammatical errors inadvertently enclosed that do not reflect the nature of this encounter. Every attempt is made to correct such errors.  Tessa Lerner, [place], [facility]  Pager:  [phone] Office: [phone]

## 2023-05-11 ENCOUNTER — Encounter (HOSPITAL_COMMUNITY): Payer: Self-pay | Admitting: *Deleted

## 2023-05-11 ENCOUNTER — Telehealth (HOSPITAL_COMMUNITY): Payer: Self-pay | Admitting: *Deleted

## 2023-05-11 ENCOUNTER — Other Ambulatory Visit (HOSPITAL_COMMUNITY): Payer: Self-pay | Admitting: *Deleted

## 2023-05-11 DIAGNOSIS — Z7901 Long term (current) use of anticoagulants: Secondary | ICD-10-CM | POA: Diagnosis not present

## 2023-05-11 MED ORDER — ZOLPIDEM TARTRATE 10 MG PO TABS: 10.0000 mg | ORAL_TABLET | Freq: Every evening | ORAL | 4 refills | Status: DC | PRN

## 2023-05-11 NOTE — Telephone Encounter (Signed)
Pt called advising that she has d/c the Belsomra 20 mg tabs r/t c/o a rash and SOB. Pt states that she felt like she could not breathe. Dr. Donell Beers advised and Belsomra d/c and pt start Zolpidem 10 mg at bedtime PRN. Pt advised.

## 2023-05-31 ENCOUNTER — Other Ambulatory Visit: Payer: Self-pay | Admitting: Cardiology

## 2023-05-31 DIAGNOSIS — R002 Palpitations: Secondary | ICD-10-CM

## 2023-06-08 DIAGNOSIS — Z86711 Personal history of pulmonary embolism: Secondary | ICD-10-CM | POA: Diagnosis not present

## 2023-06-08 DIAGNOSIS — Z7901 Long term (current) use of anticoagulants: Secondary | ICD-10-CM | POA: Diagnosis not present

## 2023-07-02 DIAGNOSIS — E7800 Pure hypercholesterolemia, unspecified: Secondary | ICD-10-CM | POA: Diagnosis not present

## 2023-07-02 DIAGNOSIS — Z8673 Personal history of transient ischemic attack (TIA), and cerebral infarction without residual deficits: Secondary | ICD-10-CM | POA: Diagnosis not present

## 2023-07-02 DIAGNOSIS — M329 Systemic lupus erythematosus, unspecified: Secondary | ICD-10-CM | POA: Diagnosis not present

## 2023-07-02 DIAGNOSIS — G43909 Migraine, unspecified, not intractable, without status migrainosus: Secondary | ICD-10-CM | POA: Diagnosis not present

## 2023-07-02 DIAGNOSIS — Z86711 Personal history of pulmonary embolism: Secondary | ICD-10-CM | POA: Diagnosis not present

## 2023-07-02 DIAGNOSIS — M797 Fibromyalgia: Secondary | ICD-10-CM | POA: Diagnosis not present

## 2023-07-02 DIAGNOSIS — N1831 Chronic kidney disease, stage 3a: Secondary | ICD-10-CM | POA: Diagnosis not present

## 2023-07-02 DIAGNOSIS — I10 Essential (primary) hypertension: Secondary | ICD-10-CM | POA: Diagnosis not present

## 2023-07-02 DIAGNOSIS — M069 Rheumatoid arthritis, unspecified: Secondary | ICD-10-CM | POA: Diagnosis not present

## 2023-07-02 DIAGNOSIS — K5900 Constipation, unspecified: Secondary | ICD-10-CM | POA: Diagnosis not present

## 2023-07-02 DIAGNOSIS — G4700 Insomnia, unspecified: Secondary | ICD-10-CM | POA: Diagnosis not present

## 2023-07-14 DIAGNOSIS — Z7901 Long term (current) use of anticoagulants: Secondary | ICD-10-CM | POA: Diagnosis not present

## 2023-07-15 DIAGNOSIS — Z79899 Other long term (current) drug therapy: Secondary | ICD-10-CM | POA: Diagnosis not present

## 2023-07-15 DIAGNOSIS — M1990 Unspecified osteoarthritis, unspecified site: Secondary | ICD-10-CM | POA: Diagnosis not present

## 2023-07-15 DIAGNOSIS — M0609 Rheumatoid arthritis without rheumatoid factor, multiple sites: Secondary | ICD-10-CM | POA: Diagnosis not present

## 2023-07-15 DIAGNOSIS — Z86718 Personal history of other venous thrombosis and embolism: Secondary | ICD-10-CM | POA: Diagnosis not present

## 2023-07-15 DIAGNOSIS — M810 Age-related osteoporosis without current pathological fracture: Secondary | ICD-10-CM | POA: Diagnosis not present

## 2023-07-15 DIAGNOSIS — Z8739 Personal history of other diseases of the musculoskeletal system and connective tissue: Secondary | ICD-10-CM | POA: Diagnosis not present

## 2023-07-15 DIAGNOSIS — M797 Fibromyalgia: Secondary | ICD-10-CM | POA: Diagnosis not present

## 2023-07-15 DIAGNOSIS — N1831 Chronic kidney disease, stage 3a: Secondary | ICD-10-CM | POA: Diagnosis not present

## 2023-08-11 DIAGNOSIS — Z7901 Long term (current) use of anticoagulants: Secondary | ICD-10-CM | POA: Diagnosis not present

## 2023-08-11 DIAGNOSIS — Z86711 Personal history of pulmonary embolism: Secondary | ICD-10-CM | POA: Diagnosis not present

## 2023-08-12 ENCOUNTER — Other Ambulatory Visit: Payer: Self-pay

## 2023-08-12 ENCOUNTER — Ambulatory Visit (HOSPITAL_BASED_OUTPATIENT_CLINIC_OR_DEPARTMENT_OTHER): Payer: 59 | Admitting: Psychiatry

## 2023-08-12 ENCOUNTER — Encounter (HOSPITAL_COMMUNITY): Payer: Self-pay | Admitting: Psychiatry

## 2023-08-12 VITALS — BP 118/83 | HR 72 | Ht 68.0 in | Wt 175.0 lb

## 2023-08-12 DIAGNOSIS — F325 Major depressive disorder, single episode, in full remission: Secondary | ICD-10-CM | POA: Diagnosis not present

## 2023-08-12 MED ORDER — BUPROPION HCL ER (XL) 300 MG PO TB24: ORAL_TABLET | ORAL | 1 refills | Status: DC

## 2023-08-12 MED ORDER — BUPROPION HCL ER (XL) 300 MG PO TB24: ORAL_TABLET | ORAL | 5 refills | Status: DC

## 2023-08-12 MED ORDER — ZOLPIDEM TARTRATE 10 MG PO TABS: 10.0000 mg | ORAL_TABLET | Freq: Every evening | ORAL | 3 refills | Status: DC | PRN

## 2023-08-12 MED ORDER — ALPRAZOLAM 1 MG PO TABS: ORAL_TABLET | ORAL | 4 refills | Status: DC
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
## 2023-08-17 DIAGNOSIS — M0609 Rheumatoid arthritis without rheumatoid factor, multiple sites: Secondary | ICD-10-CM | POA: Diagnosis not present

## 2023-08-25 DIAGNOSIS — Z7901 Long term (current) use of anticoagulants: Secondary | ICD-10-CM | POA: Diagnosis not present

## 2023-09-01 DIAGNOSIS — M0609 Rheumatoid arthritis without rheumatoid factor, multiple sites: Secondary | ICD-10-CM | POA: Diagnosis not present

## 2023-09-22 DIAGNOSIS — Z7901 Long term (current) use of anticoagulants: Secondary | ICD-10-CM | POA: Diagnosis not present

## 2023-10-20 ENCOUNTER — Ambulatory Visit (HOSPITAL_COMMUNITY): Payer: 59 | Admitting: Psychiatry

## 2023-11-09 DIAGNOSIS — Z7901 Long term (current) use of anticoagulants: Secondary | ICD-10-CM | POA: Diagnosis not present

## 2023-11-10 ENCOUNTER — Other Ambulatory Visit (HOSPITAL_COMMUNITY): Payer: Self-pay | Admitting: Psychiatry

## 2023-11-24 DIAGNOSIS — Z7901 Long term (current) use of anticoagulants: Secondary | ICD-10-CM | POA: Diagnosis not present

## 2023-12-01 ENCOUNTER — Other Ambulatory Visit: Payer: Self-pay | Admitting: Cardiology

## 2023-12-01 DIAGNOSIS — R002 Palpitations: Secondary | ICD-10-CM

## 2023-12-07 DIAGNOSIS — M797 Fibromyalgia: Secondary | ICD-10-CM | POA: Diagnosis not present

## 2023-12-07 DIAGNOSIS — Z8739 Personal history of other diseases of the musculoskeletal system and connective tissue: Secondary | ICD-10-CM | POA: Diagnosis not present

## 2023-12-07 DIAGNOSIS — Z79899 Other long term (current) drug therapy: Secondary | ICD-10-CM | POA: Diagnosis not present

## 2023-12-07 DIAGNOSIS — M0609 Rheumatoid arthritis without rheumatoid factor, multiple sites: Secondary | ICD-10-CM | POA: Diagnosis not present

## 2023-12-07 DIAGNOSIS — M810 Age-related osteoporosis without current pathological fracture: Secondary | ICD-10-CM | POA: Diagnosis not present

## 2023-12-07 DIAGNOSIS — Z86718 Personal history of other venous thrombosis and embolism: Secondary | ICD-10-CM | POA: Diagnosis not present

## 2023-12-07 DIAGNOSIS — N1831 Chronic kidney disease, stage 3a: Secondary | ICD-10-CM | POA: Diagnosis not present

## 2023-12-07 DIAGNOSIS — M1990 Unspecified osteoarthritis, unspecified site: Secondary | ICD-10-CM | POA: Diagnosis not present

## 2023-12-13 ENCOUNTER — Other Ambulatory Visit: Payer: Self-pay

## 2023-12-13 ENCOUNTER — Emergency Department (HOSPITAL_COMMUNITY): Payer: 59

## 2023-12-13 ENCOUNTER — Inpatient Hospital Stay (HOSPITAL_COMMUNITY)
Admission: EM | Admit: 2023-12-13 | Discharge: 2023-12-18 | DRG: 092 | Disposition: A | Discharge status: Home Health | Payer: 59 | Attending: Family Medicine | Admitting: Family Medicine

## 2023-12-13 DIAGNOSIS — Z7983 Long term (current) use of bisphosphonates: Secondary | ICD-10-CM | POA: Diagnosis not present

## 2023-12-13 DIAGNOSIS — N3000 Acute cystitis without hematuria: Secondary | ICD-10-CM | POA: Diagnosis not present

## 2023-12-13 DIAGNOSIS — R5381 Other malaise: Secondary | ICD-10-CM | POA: Diagnosis not present

## 2023-12-13 DIAGNOSIS — N390 Urinary tract infection, site not specified: Secondary | ICD-10-CM | POA: Diagnosis not present

## 2023-12-13 DIAGNOSIS — G928 Other toxic encephalopathy: Principal | ICD-10-CM | POA: Diagnosis present

## 2023-12-13 DIAGNOSIS — Z806 Family history of leukemia: Secondary | ICD-10-CM | POA: Diagnosis not present

## 2023-12-13 DIAGNOSIS — M329 Systemic lupus erythematosus, unspecified: Secondary | ICD-10-CM | POA: Diagnosis not present

## 2023-12-13 DIAGNOSIS — E785 Hyperlipidemia, unspecified: Secondary | ICD-10-CM

## 2023-12-13 DIAGNOSIS — R29818 Other symptoms and signs involving the nervous system: Secondary | ICD-10-CM | POA: Diagnosis not present

## 2023-12-13 DIAGNOSIS — Z8673 Personal history of transient ischemic attack (TIA), and cerebral infarction without residual deficits: Secondary | ICD-10-CM

## 2023-12-13 DIAGNOSIS — F419 Anxiety disorder, unspecified: Secondary | ICD-10-CM | POA: Diagnosis present

## 2023-12-13 DIAGNOSIS — F32A Depression, unspecified: Secondary | ICD-10-CM | POA: Diagnosis present

## 2023-12-13 DIAGNOSIS — M328 Other forms of systemic lupus erythematosus: Secondary | ICD-10-CM | POA: Diagnosis not present

## 2023-12-13 DIAGNOSIS — R519 Headache, unspecified: Secondary | ICD-10-CM | POA: Diagnosis not present

## 2023-12-13 DIAGNOSIS — I6930 Unspecified sequelae of cerebral infarction: Secondary | ICD-10-CM

## 2023-12-13 DIAGNOSIS — Z813 Family history of other psychoactive substance abuse and dependence: Secondary | ICD-10-CM

## 2023-12-13 DIAGNOSIS — M0600 Rheumatoid arthritis without rheumatoid factor, unspecified site: Secondary | ICD-10-CM | POA: Diagnosis not present

## 2023-12-13 DIAGNOSIS — R4182 Altered mental status, unspecified: Secondary | ICD-10-CM | POA: Diagnosis not present

## 2023-12-13 DIAGNOSIS — Z91041 Radiographic dye allergy status: Secondary | ICD-10-CM

## 2023-12-13 DIAGNOSIS — Z7901 Long term (current) use of anticoagulants: Secondary | ICD-10-CM

## 2023-12-13 DIAGNOSIS — I6782 Cerebral ischemia: Secondary | ICD-10-CM | POA: Diagnosis not present

## 2023-12-13 DIAGNOSIS — G4710 Hypersomnia, unspecified: Secondary | ICD-10-CM | POA: Diagnosis not present

## 2023-12-13 DIAGNOSIS — R410 Disorientation, unspecified: Secondary | ICD-10-CM | POA: Diagnosis not present

## 2023-12-13 DIAGNOSIS — E860 Dehydration: Secondary | ICD-10-CM | POA: Diagnosis present

## 2023-12-13 DIAGNOSIS — G379 Demyelinating disease of central nervous system, unspecified: Secondary | ICD-10-CM | POA: Diagnosis not present

## 2023-12-13 DIAGNOSIS — R569 Unspecified convulsions: Secondary | ICD-10-CM | POA: Diagnosis not present

## 2023-12-13 DIAGNOSIS — N1831 Chronic kidney disease, stage 3a: Secondary | ICD-10-CM | POA: Diagnosis not present

## 2023-12-13 DIAGNOSIS — I10 Essential (primary) hypertension: Secondary | ICD-10-CM | POA: Diagnosis present

## 2023-12-13 DIAGNOSIS — Z79620 Long term (current) use of immunosuppressive biologic: Secondary | ICD-10-CM

## 2023-12-13 DIAGNOSIS — I129 Hypertensive chronic kidney disease with stage 1 through stage 4 chronic kidney disease, or unspecified chronic kidney disease: Secondary | ICD-10-CM | POA: Diagnosis present

## 2023-12-13 DIAGNOSIS — T426X5A Adverse effect of other antiepileptic and sedative-hypnotic drugs, initial encounter: Secondary | ICD-10-CM | POA: Diagnosis not present

## 2023-12-13 DIAGNOSIS — D84821 Immunodeficiency due to drugs: Secondary | ICD-10-CM | POA: Diagnosis present

## 2023-12-13 DIAGNOSIS — Z1152 Encounter for screening for COVID-19: Secondary | ICD-10-CM | POA: Diagnosis not present

## 2023-12-13 DIAGNOSIS — Z886 Allergy status to analgesic agent status: Secondary | ICD-10-CM

## 2023-12-13 DIAGNOSIS — Z79899 Other long term (current) drug therapy: Secondary | ICD-10-CM

## 2023-12-13 DIAGNOSIS — D849 Immunodeficiency, unspecified: Secondary | ICD-10-CM | POA: Diagnosis not present

## 2023-12-13 DIAGNOSIS — Z885 Allergy status to narcotic agent status: Secondary | ICD-10-CM

## 2023-12-13 DIAGNOSIS — R8290 Unspecified abnormal findings in urine: Secondary | ICD-10-CM

## 2023-12-13 DIAGNOSIS — Z7952 Long term (current) use of systemic steroids: Secondary | ICD-10-CM

## 2023-12-13 DIAGNOSIS — R400 Somnolence: Secondary | ICD-10-CM | POA: Diagnosis not present

## 2023-12-13 DIAGNOSIS — N183 Chronic kidney disease, stage 3 unspecified: Secondary | ICD-10-CM

## 2023-12-13 DIAGNOSIS — E876 Hypokalemia: Secondary | ICD-10-CM | POA: Diagnosis not present

## 2023-12-13 DIAGNOSIS — Z743 Need for continuous supervision: Secondary | ICD-10-CM | POA: Diagnosis not present

## 2023-12-13 DIAGNOSIS — T424X5A Adverse effect of benzodiazepines, initial encounter: Secondary | ICD-10-CM | POA: Diagnosis present

## 2023-12-13 DIAGNOSIS — F4310 Post-traumatic stress disorder, unspecified: Secondary | ICD-10-CM | POA: Diagnosis present

## 2023-12-13 DIAGNOSIS — Z86711 Personal history of pulmonary embolism: Secondary | ICD-10-CM

## 2023-12-13 DIAGNOSIS — Z888 Allergy status to other drugs, medicaments and biological substances status: Secondary | ICD-10-CM

## 2023-12-13 DIAGNOSIS — R531 Weakness: Secondary | ICD-10-CM | POA: Diagnosis not present

## 2023-12-13 LAB — COMPREHENSIVE METABOLIC PANEL
ALT: 23 U/L (ref 0–44)
AST: 28 U/L (ref 15–41)
Albumin: 3.9 g/dL (ref 3.5–5.0)
Alkaline Phosphatase: 94 U/L (ref 38–126)
Anion gap: 10 (ref 5–15)
BUN: 14 mg/dL (ref 6–20)
CO2: 24 mmol/L (ref 22–32)
Calcium: 8.9 mg/dL (ref 8.9–10.3)
Chloride: 105 mmol/L (ref 98–111)
Creatinine, Ser: 1.33 mg/dL — ABNORMAL HIGH (ref 0.44–1.00)
GFR, Estimated: 47 mL/min — ABNORMAL LOW (ref >60)
Glucose, Bld: 79 mg/dL (ref 70–99)
Potassium: 4.4 mmol/L (ref 3.5–5.1)
Sodium: 139 mmol/L (ref 135–145)
Total Bilirubin: 0.8 mg/dL (ref 0.0–1.2)
Total Protein: 7.3 g/dL (ref 6.5–8.1)

## 2023-12-13 LAB — CBC WITH DIFFERENTIAL/PLATELET
Abs Immature Granulocytes: 0.01 10*3/uL (ref 0.00–0.07)
Basophils Absolute: 0.1 10*3/uL (ref 0.0–0.1)
Basophils Relative: 1 %
Eosinophils Absolute: 0.3 10*3/uL (ref 0.0–0.5)
Eosinophils Relative: 4 %
HCT: 47 % — ABNORMAL HIGH (ref 36.0–46.0)
Hemoglobin: 15.1 g/dL — ABNORMAL HIGH (ref 12.0–15.0)
Immature Granulocytes: 0 %
Lymphocytes Relative: 20 %
Lymphs Abs: 1.6 10*3/uL (ref 0.7–4.0)
MCH: 30.1 pg (ref 26.0–34.0)
MCHC: 32.1 g/dL (ref 30.0–36.0)
MCV: 93.8 fL (ref 80.0–100.0)
Monocytes Absolute: 0.6 10*3/uL (ref 0.1–1.0)
Monocytes Relative: 8 %
Neutro Abs: 5.4 10*3/uL (ref 1.7–7.7)
Neutrophils Relative %: 67 %
Platelets: 209 10*3/uL (ref 150–400)
RBC: 5.01 MIL/uL (ref 3.87–5.11)
RDW: 13.7 % (ref 11.5–15.5)
WBC: 8 10*3/uL (ref 4.0–10.5)
nRBC: 0 % (ref 0.0–0.2)

## 2023-12-13 LAB — RESP PANEL BY RT-PCR (RSV, FLU A&B, COVID)  RVPGX2
Influenza A by PCR: NEGATIVE
Influenza B by PCR: NEGATIVE
Resp Syncytial Virus by PCR: NEGATIVE
SARS Coronavirus 2 by RT PCR: NEGATIVE

## 2023-12-13 LAB — URINALYSIS, W/ REFLEX TO CULTURE (INFECTION SUSPECTED)
Bilirubin Urine: NEGATIVE
Glucose, UA: NEGATIVE mg/dL
Ketones, ur: NEGATIVE mg/dL
Nitrite: NEGATIVE
Protein, ur: NEGATIVE mg/dL
Specific Gravity, Urine: 1.012 (ref 1.005–1.030)
pH: 6 (ref 5.0–8.0)

## 2023-12-13 LAB — RAPID URINE DRUG SCREEN, HOSP PERFORMED
Amphetamines: NOT DETECTED
Barbiturates: NOT DETECTED
Benzodiazepines: POSITIVE — AB
Cocaine: NOT DETECTED
Opiates: NOT DETECTED
Tetrahydrocannabinol: NOT DETECTED

## 2023-12-13 LAB — BLOOD GAS, VENOUS
Acid-Base Excess: 0.1 mmol/L (ref 0.0–2.0)
Bicarbonate: 27.7 mmol/L (ref 20.0–28.0)
O2 Saturation: 42.3 %
Patient temperature: 37
pCO2, Ven: 55 mm[Hg] (ref 44–60)
pH, Ven: 7.31 (ref 7.25–7.43)
pO2, Ven: <31 mm[Hg] — CL (ref 32–45)

## 2023-12-13 LAB — TSH: TSH: 2.182 u[IU]/mL (ref 0.350–4.500)

## 2023-12-13 LAB — AMMONIA: Ammonia: 34 umol/L (ref 9–35)

## 2023-12-13 MED ORDER — DOCUSATE SODIUM 100 MG PO CAPS
100.0000 mg | ORAL_CAPSULE | Freq: Two times a day (BID) | ORAL | Status: DC
  Administered 2023-12-14 – 2023-12-18 (×9): 100 mg via ORAL
  Filled 2023-12-13 (×9): qty 1

## 2023-12-13 MED ORDER — SODIUM CHLORIDE 0.9 % IV SOLN
2.0000 g | Freq: Two times a day (BID) | INTRAVENOUS | Status: DC
  Administered 2023-12-14: 2 g via INTRAVENOUS
  Filled 2023-12-13: qty 20

## 2023-12-13 MED ORDER — SODIUM CHLORIDE 0.9 % IV SOLN
1.0000 g | Freq: Once | INTRAVENOUS | Status: AC
  Administered 2023-12-13: 1 g via INTRAVENOUS
  Filled 2023-12-13: qty 10

## 2023-12-13 MED ORDER — ONDANSETRON HCL 4 MG/2ML IJ SOLN: 4.0000 mg | Freq: Four times a day (QID) | INTRAMUSCULAR | Status: DC | PRN

## 2023-12-13 MED ORDER — BUPROPION HCL ER (XL) 300 MG PO TB24
300.0000 mg | ORAL_TABLET | Freq: Every day | ORAL | Status: DC
  Administered 2023-12-14 – 2023-12-18 (×5): 300 mg via ORAL
  Filled 2023-12-13 (×3): qty 1
  Filled 2023-12-13: qty 2
  Filled 2023-12-13: qty 1

## 2023-12-13 MED ORDER — ENOXAPARIN SODIUM 40 MG/0.4ML IJ SOSY
40.0000 mg | PREFILLED_SYRINGE | INTRAMUSCULAR | Status: DC
  Administered 2023-12-14: 40 mg via SUBCUTANEOUS
  Filled 2023-12-13: qty 0.4

## 2023-12-13 MED ORDER — ROSUVASTATIN CALCIUM 10 MG PO TABS
10.0000 mg | ORAL_TABLET | Freq: Every day | ORAL | Status: DC
  Administered 2023-12-14 – 2023-12-18 (×5): 10 mg via ORAL
  Filled 2023-12-13 (×5): qty 1

## 2023-12-13 MED ORDER — SODIUM CHLORIDE 0.9 % IV BOLUS
1000.0000 mL | Freq: Once | INTRAVENOUS | Status: AC
  Administered 2023-12-13: 1000 mL via INTRAVENOUS

## 2023-12-13 MED ORDER — CEFTRIAXONE SODIUM 1 G IJ SOLR: 1.0000 g | Freq: Once | INTRAMUSCULAR | Status: DC

## 2023-12-13 MED ORDER — ONDANSETRON HCL 4 MG PO TABS: 4.0000 mg | ORAL_TABLET | Freq: Four times a day (QID) | ORAL | Status: DC | PRN

## 2023-12-13 MED ORDER — VANCOMYCIN HCL 750 MG/150ML IV SOLN
750.0000 mg | Freq: Two times a day (BID) | INTRAVENOUS | Status: DC
  Administered 2023-12-14: 750 mg via INTRAVENOUS
  Filled 2023-12-13: qty 150

## 2023-12-13 MED ORDER — LACTATED RINGERS IV BOLUS
1000.0000 mL | Freq: Once | INTRAVENOUS | Status: AC
  Administered 2023-12-14: 1000 mL via INTRAVENOUS

## 2023-12-13 MED ORDER — VANCOMYCIN HCL 1500 MG/300ML IV SOLN
1500.0000 mg | Freq: Once | INTRAVENOUS | Status: DC
  Administered 2023-12-13: 1500 mg via INTRAVENOUS
  Filled 2023-12-13: qty 300

## 2023-12-13 NOTE — ED Provider Triage Note (Signed)
 Emergency Medicine Provider Triage Evaluation Note  Loana Salvaggio , a 58 y.o. female  was evaluated in triage.  Pt complains of weakness.  Review of Systems  Positive:  Negative:   Physical Exam  BP 90/62   Pulse 87   Temp 97.8 F (36.6 C) (Oral)   Resp 16   LMP  (LMP Unknown)   SpO2 96%  Gen:   Awake, no distress   Resp:  Normal effort  MSK:   Moves extremities without difficulty  Other:    Medical Decision Making  Medically screening exam initiated at 1:48 PM.  Appropriate orders placed.  Taralyn Sperl was informed that the remainder of the evaluation will be completed by another provider, this initial triage assessment does not replace that evaluation, and the importance of remaining in the ED until their evaluation is complete.  Hx of lupus. Some confusion and weakness progressing over 2 months. Daughter stating that these symptoms are similar to lupus flares in the past.   Hoy Nidia FALCON, [place] 12/13/23 1350

## 2023-12-13 NOTE — ED Notes (Signed)
 Unable to get PT/INR after 2 attempts

## 2023-12-13 NOTE — ED Provider Notes (Signed)
 [place] [facility] AT [facility] Provider Note   CSN: [id number] Arrival date & time: 12/13/23  1330     History  Chief Complaint  Patient presents with   Weakness    Lauren Chung is a 58 y.o. female.  The history is provided by the patient, medical records and a relative. The history is limited by the condition of the patient. No language interpreter was used.  Illness Location:  Severe fatigue for last few days Severity:  Severe Onset quality:  Gradual Duration:  2 days Timing:  Constant Progression:  Unchanged Chronicity:  Recurrent Associated symptoms: cough and fatigue   Associated symptoms: no abdominal pain, no chest pain, no congestion, no diarrhea, no fever, no headaches, no loss of consciousness, no nausea, no rash, no rhinorrhea, no shortness of breath, no vomiting and no wheezing        Home Medications Prior to Admission medications   Medication Sig Start Date End Date Taking? Authorizing Provider  alendronate (FOSAMAX) 70 MG tablet Take 70 mg by mouth once a week. 07/15/23  Yes [provider]  ALPRAZolam  (XANAX ) 1 MG tablet 1 qam  2 qhs 08/12/23   Plovsky, Elna, MD  buPROPion  (WELLBUTRIN  XL) 300 MG 24 hr tablet 1  qam 08/12/23   Plovsky, Elna, MD  hydroxychloroquine  (PLAQUENIL ) 200 MG tablet Take 200 mg by mouth daily. 02/21/22   [provider]  metoprolol  succinate (TOPROL -XL) 25 MG 24 hr tablet TAKE 1 TABLET (25 MG TOTAL) BY MOUTH IN THE MORNING 12/02/23   Tolia, Sunit, DO  predniSONE  (DELTASONE ) 5 MG tablet Take 2.5 mg by mouth daily with breakfast.    [provider]  rosuvastatin  (CRESTOR ) 10 MG tablet Take 10 mg by mouth in the morning.    [provider]  warfarin (COUMADIN ) 2 MG tablet Take 2-3 mg by mouth as directed. Take 2 mg on (Tuesday and Thursday) and Take 3 mg all other days    [provider]  warfarin (COUMADIN ) 4 MG tablet Take 4 mg by mouth daily. 07/22/23   [provider]  zolpidem  (AMBIEN ) 10 MG tablet Take 1 tablet (10 mg total) by mouth at bedtime as needed for sleep. 08/12/23 12/13/23  Tasia Elna, MD      Allergies    Belsomra  [suvorexant ], Codeine, Contrast media [iodinated contrast media], and Tylenol  [acetaminophen ]    Review of Systems   Review of Systems  Constitutional:  Positive for fatigue. Negative for chills and fever.  HENT:  Negative for congestion and rhinorrhea.   Eyes:  Negative for visual disturbance.  Respiratory:  Positive for cough. Negative for chest tightness, shortness of breath and wheezing.   Cardiovascular:  Negative for chest pain and palpitations.  Gastrointestinal:  Negative for abdominal distention, abdominal pain, constipation, diarrhea, nausea and vomiting.  Genitourinary:  Negative for dysuria and flank pain.  Musculoskeletal:  Negative for back pain, neck pain and neck stiffness.  Skin:  Negative for rash and wound.  Neurological:  Negative for loss of consciousness, syncope, light-headedness, numbness and headaches.  Psychiatric/Behavioral:  Negative for agitation.     Physical Exam Updated Vital Signs BP 90/62   Pulse 87   Temp 97.8 F (36.6 C) (Oral)   Resp 16   Ht 5' 8 (1.727 m)   Wt 79 kg   LMP  (LMP Unknown)   SpO2 96%   BMI 26.48 kg/m  Physical Exam Vitals and nursing note reviewed.  Constitutional:  General: She is not in acute distress.    Appearance: She is well-developed. She is not ill-appearing, toxic-appearing or diaphoretic.  HENT:     Head: Normocephalic and atraumatic.     Nose: No congestion or rhinorrhea.     Mouth/Throat:     Mouth: Mucous membranes are dry.     Pharynx: No oropharyngeal exudate or posterior oropharyngeal erythema.  Eyes:     Extraocular Movements: Extraocular movements intact.     Conjunctiva/sclera: Conjunctivae normal.     Pupils: Pupils are equal, round, and reactive to light.  Cardiovascular:     Rate and Rhythm: Normal rate and  regular rhythm.     Heart sounds: No murmur heard. Pulmonary:     Effort: Pulmonary effort is normal. No respiratory distress.     Breath sounds: Normal breath sounds. No wheezing, rhonchi or rales.  Chest:     Chest wall: No tenderness.  Abdominal:     General: Abdomen is flat. There is no distension.     Palpations: Abdomen is soft.     Tenderness: There is no abdominal tenderness. There is no right CVA tenderness, left CVA tenderness, guarding or rebound.  Musculoskeletal:        General: No swelling or tenderness.     Cervical back: Neck supple. No tenderness.     Right lower leg: No edema.     Left lower leg: No edema.  Skin:    General: Skin is warm and dry.     Capillary Refill: Capillary refill takes less than 2 seconds.     Findings: No erythema.  Neurological:     General: No focal deficit present.     Mental Status: She is alert.     Sensory: No sensory deficit.     Motor: No weakness.  Psychiatric:        Mood and Affect: Mood normal.     ED Results / Procedures / Treatments   Labs (all labs ordered are listed, but only abnormal results are displayed) Labs Reviewed  COMPREHENSIVE METABOLIC PANEL - Abnormal; Notable for the following components:      Result Value   Creatinine, Ser 1.33 (*)    GFR, Estimated 47 (*)    All other components within normal limits  CBC WITH DIFFERENTIAL/PLATELET - Abnormal; Notable for the following components:   Hemoglobin 15.1 (*)    HCT 47.0 (*)    All other components within normal limits  URINALYSIS, W/ REFLEX TO CULTURE (INFECTION SUSPECTED) - Abnormal; Notable for the following components:   Hgb urine dipstick SMALL (*)    Leukocytes,Ua MODERATE (*)    Bacteria, UA RARE (*)    All other components within normal limits  RAPID URINE DRUG SCREEN, HOSP PERFORMED - Abnormal; Notable for the following components:   Benzodiazepines POSITIVE (*)    All other components within normal limits  BLOOD GAS, VENOUS - Abnormal; Notable  for the following components:   pO2, Ven <31 (*)    All other components within normal limits  RESP PANEL BY RT-PCR (RSV, FLU A&B, COVID)  RVPGX2  TSH  AMMONIA  CBG MONITORING, ED    EKG EKG Interpretation Date/Time:  Sunday December 13 2023 13:55:57 EST Ventricular Rate:  78 PR Interval:  156 QRS Duration:  89 QT Interval:  387 QTC Calculation: 441 R Axis:   58  Text Interpretation: Sinus rhythm Low voltage, precordial leads when compared to prior, similar appearance. No STEMI Confirmed by Pansie Guggisberg, [place] ([id number]) on  12/13/2023 3:59:07 PM  Radiology DG Chest Portable 1 View Result Date: 12/13/2023 CLINICAL DATA:  Increasing weakness over the past 2 months. EXAM: PORTABLE CHEST 1 VIEW COMPARISON:  Chest x-ray dated November 12, 2018. FINDINGS: The heart size and mediastinal contours are within normal limits. Both lungs are clear. The visualized skeletal structures are unremarkable. IMPRESSION: No active disease. Electronically Signed   By: Elsie ONEIDA Shoulder M.D.   On: 12/13/2023 16:46   CT Head Wo Contrast Result Date: 12/13/2023 CLINICAL DATA:  Altered mental status. EXAM: CT HEAD WITHOUT CONTRAST TECHNIQUE: Contiguous axial images were obtained from the base of the skull through the vertex without intravenous contrast. RADIATION DOSE REDUCTION: This exam was performed according to the departmental dose-optimization program which includes automated exposure control, adjustment of the mA and/or kV according to patient size and/or use of iterative reconstruction technique. COMPARISON:  Head CT dated 05/27/2022. FINDINGS: Brain: The ventricles and sulci appropriate size for patient's age. Mild periventricular and deep white matter chronic microvascular ischemic changes noted. There is no acute intracranial hemorrhage. No mass effect or midline shift no extra-axial fluid collection. A 1 cm probable calcified meningioma along the right parietal convexity. Vascular: No hyperdense vessel or unexpected  calcification. Skull: Normal. Negative for fracture or focal lesion. Sinuses/Orbits: Mild mucoperiosteal thickening of paranasal sinuses. No air-fluid level. The mastoid air cells are clear. Other: None IMPRESSION: 1. No acute intracranial pathology. 2. Mild chronic microvascular ischemic changes. Electronically Signed   By: Vanetta Chou M.D.   On: 12/13/2023 14:30    Procedures Procedures    Medications Ordered in ED Medications  cefTRIAXone  (ROCEPHIN ) 1 g in sodium chloride  0.9 % 100 mL IVPB (has no administration in time range)  sodium chloride  0.9 % bolus 1,000 mL (0 mLs Intravenous Stopped 12/13/23 2024)    ED Course/ Medical Decision Making/ A&P                                 Medical Decision Making Amount and/or Complexity of Data Reviewed Labs: ordered. Radiology: ordered.  Risk Decision regarding hospitalization.    Cookie Pore is a 58 y.o. female with a past medical history significant for previous stroke, previous pulmonary embolism on Coumadin  therapy, hypertension, hyperlipidemia, anxiety, depression, and lupus who presents with fatigue.  According to patient and family, she has had several days of worsening fatigue.  She has had no energy and she says she has not had much to eat or drink over the last 2 days.  Patient initially was disoriented.  She is denying any pain with no headache, neck pain, chest pain, back pain or abdominal pain specifically.  She denies any nausea, vomiting, constipation, diarrhea, or urinary changes.  She reports she has had cough since November but is not describing any cough over the last few days per family.  She reports a dry mouth.  No other focal complaints aside from the fatigue.  She denies any recent medication changes.  On exam, lungs clear.  Chest nontender.  Abdomen nontender.  Patient moving all extremities.  Back and flanks nontender.  Neck nontender.  Patient had intact sensation and strength in extremities on my exam.   Symmetric smile.  Clear speech.  Patient was somnolent but arousable to voice.  Pupils symmetric and reactive with normal extraocular movements.  Patient otherwise sleepy but well-appearing.  Vital signs revealed she was not febrile and was not tachycardic or tachypneic.  Normal  oxygen saturations on room air.  She was not warm to the touch.  Patient's blood pressure was 90 systolic initially.  She has dry mucous membranes.  Clinically suspect she is dehydrated over the last few days not taking as much intake so we will give some fluids.  Will check labs.  Patient will have a head CT given the altered mental status although there was no reported trauma.  Will look for infection by getting chest x-ray and urinalysis.  Anticipate reassessment after workup to determine disposition.  7:52 PM Patient is still somewhat confused and somnolent.  Workup began to return and the only significant abnormality seen was evidence of urinary tract infection and likely some dehydration.  She is also positive for benzos which appear she takes.  Unclear if there is a degree of polypharmacy however she is still very somnolent and I do not feel she is safe for discharge home.  Will order antibiotics and call for admission for altered mental status with urinary tract infection.         Final Clinical Impression(s) / ED Diagnoses Final diagnoses:  Acute cystitis without hematuria  Altered mental status, unspecified altered mental status type  Somnolence      Clinical Impression: 1. Acute cystitis without hematuria   2. Altered mental status, unspecified altered mental status type   3. Somnolence     Disposition: Admit  This note was prepared with assistance of Dragon voice recognition software. Occasional wrong-word or sound-a-like substitutions may have occurred due to the inherent limitations of voice recognition software.     Dee Maday, Lonni PARAS, MD 12/13/23 2038

## 2023-12-13 NOTE — ED Triage Notes (Signed)
 Pt BIBA from home. C/o weakness that has increased over 2x months. Mild confusion. Hx lupus and per daughter states this is similar to past lupus flare up.   BP: 110/68 HR: 80 CBG: 77

## 2023-12-13 NOTE — Progress Notes (Signed)
 Pharmacy Antibiotic Note  Lauren Chung is a 58 y.o. female admitted on 12/13/2023 with meningitis.  Pharmacy has been consulted for vancomycin  dosing.  Plan: Vancomycin  1500 mg IV x 1, then 750 mg IV every 12 hours.  Goal trough 15-20 mcg/mL. Rocephin  2g IV q12 hr per MD; dosing appropriate Check SCr tomorrow AM   Height: 5' 8 (172.7 cm) Weight: 79 kg (174 lb 2.6 oz) IBW/kg (Calculated) : 63.9  Temp (24hrs), Avg:97.9 F (36.6 C), Min:97.7 F (36.5 C), Max:98.1 F (36.7 C)  Recent Labs  Lab 12/13/23 1522  WBC 8.0  CREATININE 1.33*    Estimated Creatinine Clearance: 52.1 mL/min (A) (by C-G formula based on SCr of 1.33 mg/dL (H)).    Allergies  Allergen Reactions   Acetaminophen  Nausea And Vomiting   Belsomra  [Suvorexant ] Shortness Of Breath and Rash   Codeine Shortness Of Breath   Iodinated Contrast Media Shortness Of Breath    Antimicrobials this admission: 2/9 vancomycin  >>  2/9 Rocephin  >>   Dose adjustments this admission: N/a  Microbiology results: None yet  Thank you for allowing pharmacy to be a part of this patient's care.  Aarion Metzgar A 12/13/2023 9:49 PM

## 2023-12-13 NOTE — H&P (Signed)
 PCP:   Seabron Lenis, MD   Chief Complaint:  Hypersomnolence  HPI: This 58 year old female with past medical history of lupus, seronegative rheumatoid arthritis, HTN, HLD, anxiety and depression, CKD stage IIIa, PTSD, history of CVA.  [name] , Plaquenil , daily prednisone .  Patient has no memory of what occurred today.  [name].  Per report patient had severe fatigue the last few days.  Today family had difficulty waking her up.  She was brought to the ER.  It took over 6 hours for before patient woke.  Per patient she has had fever on and off for the past 2-1/2 months, Tmax 104.  She reports intermittent headache for the last several months.  She endorses neck pain and occipital head pain.  Her headache can also be generalized.  She endorses cough, dizziness and loss of headaches.  She reports occasional confusion.  Patient states she has not discussed her fever with any physician.  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  He needs to be observed during his activities.  [name]  which she reports taking only as prescribed.  MDO patient would like hypotensive with SBP>90, otherwise stable, afebrile.  pH 7.31, ammonia 34, WBCs 8, TSH 2.1, respiratory panel negative.  Urinalysis somewhat suggestive of infection..  UDS positive for benzos.  CT head no acute intracranial pathology.  CXR no acute disease.  Blood cultures x 2 collected.  IV Rocephin  given.  Review of Systems:  Unable to obtain  Past Medical History: Past Medical History:  Diagnosis Date   Anemia    Anxiety    Depression    History of blood transfusion    in [place]   Hyperlipidemia    Hypertension    Lupus    PE (pulmonary embolism)    Pneumonia    x 3 - last time 2017   PTSD (post-traumatic stress disorder)    Stroke (HCC)     many years ago per patient, no deficits   SVD (spontaneous vaginal delivery)    x 3   TIA (transient ischemic attack)    many years ago per patient, no deficits   Past Surgical History:  Procedure Laterality Date   ABDOMINAL SURGERY     due internal bleeding   ABLATION     gyn procedure for bleeding   CYST EXCISION     top of head   EXCISION MASS HEAD N/A 08/05/2019   Procedure: EXCISION OF SCALP CYST;  Surgeon: Jesus Oliphant, MD;  Location: [facility];  Service: ENT;  Laterality: N/A;    Medications: Prior to Admission medications   Medication Sig Start Date End Date Taking? Authorizing Provider  alendronate (FOSAMAX) 70 MG tablet Take 70 mg by mouth once a week. 07/15/23  Yes [provider]  ALPRAZolam  (XANAX ) 1 MG tablet 1 qam  2 qhs 08/12/23   Plovsky, Elna, MD  buPROPion  (WELLBUTRIN  XL) 300 MG 24 hr tablet 1  qam 08/12/23   Plovsky, Elna, MD  hydroxychloroquine  (PLAQUENIL ) 200 MG tablet Take 200 mg by mouth daily. 02/21/22   [provider]  metoprolol  succinate (TOPROL -XL) 25 MG 24 hr tablet TAKE 1 TABLET (25 MG TOTAL) BY MOUTH IN THE MORNING 12/02/23   Tolia, Sunit, DO  predniSONE  (DELTASONE ) 5 MG tablet Take 2.5 mg by mouth daily  with breakfast.    [provider]  rosuvastatin  (CRESTOR ) 10 MG tablet Take 10 mg by mouth in the morning.    [provider]  warfarin (COUMADIN ) 2 MG tablet Take 2-3 mg by mouth as directed. Take 2 mg on (Tuesday and Thursday) and Take 3 mg all other days    [provider]  warfarin (COUMADIN ) 4 MG tablet Take 4 mg by mouth daily. 07/22/23   [provider]  zolpidem  (AMBIEN ) 10 MG tablet Take 1 tablet (10 mg total) by mouth at bedtime as needed for sleep. 08/12/23 12/13/23  Tasia Lung, MD    Allergies:   Allergies  Allergen Reactions   Belsomra  [Suvorexant ] Shortness Of Breath and Rash   Codeine Shortness Of Breath   Contrast Media [Iodinated Contrast Media] Shortness Of  Breath   Tylenol  [Acetaminophen ] Nausea And Vomiting    Social History:  reports that she has never smoked. She has never used smokeless tobacco. She reports that she does not drink alcohol and does not use drugs.  Family History: Family History  Problem Relation Age of Onset   Drug abuse Mother    Alcohol abuse Mother    Leukemia Mother    Leukemia Father    Seizures Daughter     Physical Exam: Vitals:   12/13/23 1348 12/13/23 1600 12/13/23 1620 12/13/23 1715  BP:  90/63 106/76 107/74  Pulse:  80 78 81  Resp:  17  16  Temp:  97.7 F (36.5 C)    TempSrc:  Oral    SpO2:  98% 99% 100%  Weight: 79 kg     Height: 5' 8 (1.727 m)       General: Alert, oriented, sluggish, well developed, not in distress Eyes: Pink conjunctiva, no scleral icterus ENT: Moist oral mucosa, neck supple, mild TTP, no thyromegaly Lungs: CTA B/L, no wheeze, no crackles, no use of accessory muscles Cardiovascular: RRR, no regurgitation, no  bruits, no JVD Abdomen: soft, positive BS, NTND, no organomegaly, not an acute abdomen GU: not examined Neuro: CN II - XII grossly intact, sensation intact Musculoskeletal: strength 5/5 all extremities, no edema Skin: no rash, no subcutaneous crepitation, no decubitus Psych: appropriate, sluggish patient   Labs on Admission:  Recent Labs    12/13/23 1522  NA 139  K 4.4  CL 105  CO2 24  GLUCOSE 79  BUN 14  CREATININE 1.33*  CALCIUM  8.9   Recent Labs    12/13/23 1522  AST 28  ALT 23  ALKPHOS 94  BILITOT 0.8  PROT 7.3  ALBUMIN  3.9    Recent Labs    12/13/23 1522  WBC 8.0  NEUTROABS 5.4  HGB 15.1*  HCT 47.0*  MCV 93.8  PLT 209   Recent Labs    12/13/23 1600  TSH 2.182    Micro Results: Recent Results (from the past 240 hours)  Resp panel by RT-PCR (RSV, Flu A&B, Covid) Anterior Nasal Swab     Status: None   Collection Time: 12/13/23  2:41 PM   Specimen: Anterior Nasal Swab  Result Value Ref Range Status   SARS Coronavirus 2 by  RT PCR NEGATIVE NEGATIVE Final    Comment: (NOTE) SARS-CoV-2 target nucleic acids are NOT DETECTED.  The SARS-CoV-2 RNA is generally detectable in upper respiratory specimens during the acute phase of infection. The lowest concentration of SARS-CoV-2 viral copies this assay can detect is 138 copies/mL. A negative result does not preclude SARS-Cov-2 infection and should not be  used as the sole basis for treatment or other patient management decisions. A negative result may occur with  improper specimen collection/handling, submission of specimen other than nasopharyngeal swab, presence of viral mutation(s) within the areas targeted by this assay, and inadequate number of viral copies(<138 copies/mL). A negative result must be combined with clinical observations, patient history, and epidemiological information. The expected result is Negative.  Fact Sheet for Patients:  bloggercourse.com  Fact Sheet for Healthcare Providers:  seriousbroker.it  This test is no t yet approved or cleared by the [place]  FDA and  has been authorized for detection and/or diagnosis of SARS-CoV-2 by FDA under an Emergency Use Authorization (EUA). This EUA will remain  in effect (meaning this test can be used) for the duration of the COVID-19 declaration under Section 564(b)(1) of the Act, 21 U.S.C.section 360bbb-3(b)(1), unless the authorization is terminated  or revoked sooner.       Influenza A by PCR NEGATIVE NEGATIVE Final   Influenza B by PCR NEGATIVE NEGATIVE Final    Comment: (NOTE) The Xpert Xpress SARS-CoV-2/FLU/RSV plus assay is intended as an aid in the diagnosis of influenza from Nasopharyngeal swab specimens and should not be used as a sole basis for treatment. Nasal washings and aspirates are unacceptable for Xpert Xpress SARS-CoV-2/FLU/RSV testing.  Fact Sheet for Patients: bloggercourse.com  Fact Sheet  for Healthcare Providers: seriousbroker.it  This test is not yet approved or cleared by the [place]  FDA and has been authorized for detection and/or diagnosis of SARS-CoV-2 by FDA under an Emergency Use Authorization (EUA). This EUA will remain in effect (meaning this test can be used) for the duration of the COVID-19 declaration under Section 564(b)(1) of the Act, 21 U.S.C. section 360bbb-3(b)(1), unless the authorization is terminated or revoked.     Resp Syncytial Virus by PCR NEGATIVE NEGATIVE Final    Comment: (NOTE) Fact Sheet for Patients: bloggercourse.com  Fact Sheet for Healthcare Providers: seriousbroker.it  This test is not yet approved or cleared by the [place]  FDA and has been authorized for detection and/or diagnosis of SARS-CoV-2 by FDA under an Emergency Use Authorization (EUA). This EUA will remain in effect (meaning this test can be used) for the duration of the COVID-19 declaration under Section 564(b)(1) of the Act, 21 U.S.C. section 360bbb-3(b)(1), unless the authorization is terminated or revoked.  Performed at [facility], [address], [place], [place] [address]      Radiological Exams on Admission: DG Chest Portable 1 View Result Date: 12/13/2023 CLINICAL DATA:  Increasing weakness over the past 2 months. EXAM: PORTABLE CHEST 1 VIEW COMPARISON:  Chest x-ray dated November 12, 2018. FINDINGS: The heart size and mediastinal contours are within normal limits. Both lungs are clear. The visualized skeletal structures are unremarkable. IMPRESSION: No active disease. Electronically Signed   By: Elsie ONEIDA Shoulder M.D.   On: 12/13/2023 16:46   CT Head Wo Contrast Result Date: 12/13/2023 CLINICAL DATA:  Altered mental status. EXAM: CT HEAD WITHOUT CONTRAST TECHNIQUE: Contiguous axial images were obtained from the base of the skull through the vertex without  intravenous contrast. RADIATION DOSE REDUCTION: This exam was performed according to the departmental dose-optimization program which includes automated exposure control, adjustment of the mA and/or kV according to patient size and/or use of iterative reconstruction technique. COMPARISON:  Head CT dated 05/27/2022. FINDINGS: Brain: The ventricles and sulci appropriate size for patient's age. Mild periventricular and deep white matter chronic microvascular ischemic changes noted. There is no acute intracranial  hemorrhage. No mass effect or midline shift no extra-axial fluid collection. A 1 cm probable calcified meningioma along the right parietal convexity. Vascular: No hyperdense vessel or unexpected calcification. Skull: Normal. Negative for fracture or focal lesion. Sinuses/Orbits: Mild mucoperiosteal thickening of paranasal sinuses. No air-fluid level. The mastoid air cells are clear. Other: None IMPRESSION: 1. No acute intracranial pathology. 2. Mild chronic microvascular ischemic changes. Electronically Signed   By: Vanetta Chou M.D.   On: 12/13/2023 14:30    Assessment/Plan Present on Admission:  Hypersomnolence -Possibly due to infection.  Urinalysis abnormal.  Immunocompromised patient due to medications including prednisone  and rituximab  -Differential diagnosis of patient's fever/headache/hypersomnolence includes polypharmacy, infection [including meningitis], connective tissue disorder, blood clots -Blood cultures and urine cultures collected. -IV Rocephin  2 g IV every 12, vancomycin  ordered..  This covers UTI. -Concern for meningitis however patient currently negative Brudzinski sign -If urine culture positive, de-escalate antibiotics -D-dimer, ESR, CRP, INR ordered -If culture negative defer to a.m. team if LP or MRI indicated. -Plaquenil  on hold.  IV Solu-Medrol  40 mg daily, first dose now ordered -IV fluid hydration as patient borderline blood pressure   Abnormal urinalysis   Seronegative rheumatoid arthritis //  Lupus  -See above   HLD -Crestor  resumed   HTN (hypertension) -BP meds on hold   Anxiety and depression //  PTSD -Wellbutrin  resumed -Ativan  on hold tonight.  Patient takes 1 mg twice daily.  Concern for withdrawal.  A.m. team to resume   CKD stage 3A -Stable at baseline.  Avoid nephrotoxic medication   History of PE -Maintained on Coumadin .  Coumadin  on hold.  INR ordered. Heparin  drip when INR<2   History of CVA -   Lynniah Janoski 12/13/2023, 8:38 PM

## 2023-12-14 LAB — CBC WITH DIFFERENTIAL/PLATELET
Abs Immature Granulocytes: 0.04 10*3/uL (ref 0.00–0.07)
Basophils Absolute: 0 10*3/uL (ref 0.0–0.1)
Basophils Relative: 0 %
Eosinophils Absolute: 0 10*3/uL (ref 0.0–0.5)
Eosinophils Relative: 0 %
HCT: 43.8 % (ref 36.0–46.0)
Hemoglobin: 14 g/dL (ref 12.0–15.0)
Immature Granulocytes: 0 %
Lymphocytes Relative: 10 %
Lymphs Abs: 0.9 10*3/uL (ref 0.7–4.0)
MCH: 29.7 pg (ref 26.0–34.0)
MCHC: 32 g/dL (ref 30.0–36.0)
MCV: 93 fL (ref 80.0–100.0)
Monocytes Absolute: 0.1 10*3/uL (ref 0.1–1.0)
Monocytes Relative: 1 %
Neutro Abs: 8.5 10*3/uL — ABNORMAL HIGH (ref 1.7–7.7)
Neutrophils Relative %: 89 %
Platelets: 226 10*3/uL (ref 150–400)
RBC: 4.71 MIL/uL (ref 3.87–5.11)
RDW: 13.4 % (ref 11.5–15.5)
WBC: 9.7 10*3/uL (ref 4.0–10.5)
nRBC: 0 % (ref 0.0–0.2)

## 2023-12-14 LAB — BASIC METABOLIC PANEL
Anion gap: 9 (ref 5–15)
BUN: 14 mg/dL (ref 6–20)
CO2: 22 mmol/L (ref 22–32)
Calcium: 8.6 mg/dL — ABNORMAL LOW (ref 8.9–10.3)
Chloride: 107 mmol/L (ref 98–111)
Creatinine, Ser: 1.18 mg/dL — ABNORMAL HIGH (ref 0.44–1.00)
GFR, Estimated: 54 mL/min — ABNORMAL LOW (ref >60)
Glucose, Bld: 79 mg/dL (ref 70–99)
Potassium: 3.8 mmol/L (ref 3.5–5.1)
Sodium: 138 mmol/L (ref 135–145)

## 2023-12-14 LAB — PROTIME-INR
INR: 2.2 — ABNORMAL HIGH (ref 0.8–1.2)
Prothrombin Time: 24.9 s — ABNORMAL HIGH (ref 11.4–15.2)

## 2023-12-14 LAB — SEDIMENTATION RATE: Sed Rate: 5 mm/h (ref 0–22)

## 2023-12-14 LAB — C-REACTIVE PROTEIN: CRP: 0.7 mg/dL (ref <1.0)

## 2023-12-14 LAB — D-DIMER, QUANTITATIVE: D-Dimer, Quant: <0.27 ug{FEU}/mL (ref 0.00–0.50)

## 2023-12-14 MED ORDER — OXYMETAZOLINE HCL 0.05 % NA SOLN
1.0000 | Freq: Two times a day (BID) | NASAL | Status: AC
  Administered 2023-12-14 – 2023-12-17 (×6): 1 via NASAL
  Filled 2023-12-14 (×2): qty 15

## 2023-12-14 MED ORDER — WARFARIN - PHYSICIAN DOSING INPATIENT: Freq: Every day | Status: DC

## 2023-12-14 MED ORDER — SODIUM CHLORIDE 0.9 % IV SOLN: INTRAVENOUS | Status: AC

## 2023-12-14 MED ORDER — METHYLPREDNISOLONE SODIUM SUCC 40 MG IJ SOLR
40.0000 mg | Freq: Every day | INTRAMUSCULAR | Status: DC
  Administered 2023-12-14: 40 mg via INTRAVENOUS
  Filled 2023-12-14: qty 1

## 2023-12-14 MED ORDER — WARFARIN SODIUM 4 MG PO TABS
4.0000 mg | ORAL_TABLET | Freq: Every day | ORAL | Status: DC
  Administered 2023-12-14 – 2023-12-18 (×5): 4 mg via ORAL
  Filled 2023-12-14 (×5): qty 1

## 2023-12-14 MED ORDER — IBUPROFEN 200 MG PO TABS
600.0000 mg | ORAL_TABLET | Freq: Four times a day (QID) | ORAL | Status: DC | PRN
  Administered 2023-12-14 – 2023-12-17 (×5): 600 mg via ORAL
  Filled 2023-12-14 (×5): qty 3

## 2023-12-14 MED ORDER — SODIUM CHLORIDE 0.9% FLUSH: 10.0000 mL | INTRAVENOUS | Status: DC | PRN

## 2023-12-14 MED ORDER — SODIUM CHLORIDE 0.9 % IV SOLN
1.0000 g | INTRAVENOUS | Status: DC
  Administered 2023-12-15 – 2023-12-18 (×4): 1 g via INTRAVENOUS
  Filled 2023-12-14 (×4): qty 10

## 2023-12-14 NOTE — ED Notes (Signed)
 ED TO INPATIENT HANDOFF REPORT  ED Nurse Name and Phone #: Clemetine Cypher Name/Age/Gender Lauren Chung 58 y.o. female Room/Bed: WA14/WA14  Code Status   Code Status: Full Code  Home/SNF/Other Home Patient oriented to: self, place, time, and situation Is this baseline? Yes   Triage Complete: Triage complete  Chief Complaint Hypersomnolence [G47.10]  Triage Note Pt BIBA from home. C/o weakness that has increased over 2x months. Mild confusion. Hx lupus and per daughter states this is similar to past lupus flare up.   BP: 110/68 HR: 80 CBG: 77   Allergies Allergies  Allergen Reactions   Acetaminophen  Nausea And Vomiting   Belsomra  [Suvorexant ] Shortness Of Breath and Rash   Codeine Shortness Of Breath   Iodinated Contrast Media Shortness Of Breath    Level of Care/Admitting Diagnosis ED Disposition     ED Disposition  Admit   Condition  --   Comment  Hospital Area: [facility] [[id number]]  Level of Care: Telemetry [5]  Admit to tele based on following criteria: Other see comments  Comments: hypersomnolence  May admit patient to [facility] or [facility] if equivalent level of care is available:: Yes  Covid Evaluation: Confirmed COVID Negative  Diagnosis: Hypersomnolence [161096]  Admitting Physician: Corrinne Din [[id number]]  Attending Physician: Corrinne Din [[id number]]  Certification:: I certify this patient will need inpatient services for at least 2 midnights  Expected Medical Readiness: 12/15/2023          B Medical/Surgery History Past Medical History:  Diagnosis Date   Anemia    Anxiety    Depression    History of blood transfusion    in [place]   Hyperlipidemia    Hypertension    Lupus    PE (pulmonary embolism)    Pneumonia    x 3 - last time 2017   PTSD (post-traumatic stress disorder)    Stroke (HCC)    many years ago per patient, no deficits   SVD (spontaneous vaginal delivery)    x 3   TIA (transient ischemic attack)     many years ago per patient, no deficits   Past Surgical History:  Procedure Laterality Date   ABDOMINAL SURGERY     due internal bleeding   ABLATION     gyn procedure for bleeding   CYST EXCISION     top of head   EXCISION MASS HEAD N/A 08/05/2019   Procedure: EXCISION OF SCALP CYST;  Surgeon: Janita Mellow, MD;  Location: [facility];  Service: ENT;  Laterality: N/A;     A IV Location/Drains/Wounds Patient Lines/Drains/Airways Status     Active Line/Drains/Airways     Name Placement date Placement time Site Days   Midline Single Lumen 12/14/23 Right Basilic 10 cm 0 cm 12/14/23  0454  Basilic  less than 1   Incision (Closed) 08/05/19 Head Other (Comment) 08/05/19  1215  -- 1592            Intake/Output Last 24 hours No intake or output data in the 24 hours ending 12/14/23 1752  Labs/Imaging Results for orders placed or performed during the hospital encounter of 12/13/23 (from the past 48 hours)  Resp panel by RT-PCR (RSV, Flu A&B, Covid) Anterior Nasal Swab     Status: None   Collection Time: 12/13/23  2:41 PM   Specimen: Anterior Nasal Swab  Result Value Ref Range   SARS Coronavirus 2 by RT PCR NEGATIVE NEGATIVE    Comment: (NOTE) SARS-CoV-2  target nucleic acids are NOT DETECTED.  The SARS-CoV-2 RNA is generally detectable in upper respiratory specimens during the acute phase of infection. The lowest concentration of SARS-CoV-2 viral copies this assay can detect is 138 copies/mL. A negative result does not preclude SARS-Cov-2 infection and should not be used as the sole basis for treatment or other patient management decisions. A negative result may occur with  improper specimen collection/handling, submission of specimen other than nasopharyngeal swab, presence of viral mutation(s) within the areas targeted by this assay, and inadequate number of viral copies(<138 copies/mL). A negative result must be combined with clinical observations, patient  history, and epidemiological information. The expected result is Negative.  Fact Sheet for Patients:  BloggerCourse.com  Fact Sheet for Healthcare Providers:  SeriousBroker.it  This test is no t yet approved or cleared by the [place]  FDA and  has been authorized for detection and/or diagnosis of SARS-CoV-2 by FDA under an Emergency Use Authorization (EUA). This EUA will remain  in effect (meaning this test can be used) for the duration of the COVID-19 declaration under Section 564(b)(1) of the Act, 21 U.S.C.section 360bbb-3(b)(1), unless the authorization is terminated  or revoked sooner.       Influenza A by PCR NEGATIVE NEGATIVE   Influenza B by PCR NEGATIVE NEGATIVE    Comment: (NOTE) The Xpert Xpress SARS-CoV-2/FLU/RSV plus assay is intended as an aid in the diagnosis of influenza from Nasopharyngeal swab specimens and should not be used as a sole basis for treatment. Nasal washings and aspirates are unacceptable for Xpert Xpress SARS-CoV-2/FLU/RSV testing.  Fact Sheet for Patients: BloggerCourse.com  Fact Sheet for Healthcare Providers: SeriousBroker.it  This test is not yet approved or cleared by the [place]  FDA and has been authorized for detection and/or diagnosis of SARS-CoV-2 by FDA under an Emergency Use Authorization (EUA). This EUA will remain in effect (meaning this test can be used) for the duration of the COVID-19 declaration under Section 564(b)(1) of the Act, 21 U.S.C. section 360bbb-3(b)(1), unless the authorization is terminated or revoked.     Resp Syncytial Virus by PCR NEGATIVE NEGATIVE    Comment: (NOTE) Fact Sheet for Patients: BloggerCourse.com  Fact Sheet for Healthcare Providers: SeriousBroker.it  This test is not yet approved or cleared by the [place]  FDA and has been  authorized for detection and/or diagnosis of SARS-CoV-2 by FDA under an Emergency Use Authorization (EUA). This EUA will remain in effect (meaning this test can be used) for the duration of the COVID-19 declaration under Section 564(b)(1) of the Act, 21 U.S.C. section 360bbb-3(b)(1), unless the authorization is terminated or revoked.  Performed at [facility], [address], [place], [place] [address]   Comprehensive metabolic panel     Status: Abnormal   Collection Time: 12/13/23  3:22 PM  Result Value Ref Range   Sodium 139 135 - 145 mmol/L   Potassium 4.4 3.5 - 5.1 mmol/L   Chloride 105 98 - 111 mmol/L   CO2 24 22 - 32 mmol/L   Glucose, Bld 79 70 - 99 mg/dL    Comment: Glucose reference range applies only to samples taken after fasting for at least 8 hours.   BUN 14 6 - 20 mg/dL   Creatinine, Ser 6.04 (H) 0.44 - 1.00 mg/dL   Calcium  8.9 8.9 - 10.3 mg/dL   Total Protein 7.3 6.5 - 8.1 g/dL   Albumin  3.9 3.5 - 5.0 g/dL   AST 28 15 - 41 U/L   ALT  23 0 - 44 U/L   Alkaline Phosphatase 94 38 - 126 U/L   Total Bilirubin 0.8 0.0 - 1.2 mg/dL   GFR, Estimated 47 (L) >60 mL/min    Comment: (NOTE) Calculated using the CKD-EPI Creatinine Equation (2021)    Anion gap 10 5 - 15    Comment: Performed at [facility], [address] [place], [place] [address]  CBC with Differential     Status: Abnormal   Collection Time: 12/13/23  3:22 PM  Result Value Ref Range   WBC 8.0 4.0 - 10.5 K/uL   RBC 5.01 3.87 - 5.11 MIL/uL   Hemoglobin 15.1 (H) 12.0 - 15.0 g/dL   HCT 60.4 (H) 54.0 - 98.1 %   MCV 93.8 80.0 - 100.0 fL   MCH 30.1 26.0 - 34.0 pg   MCHC 32.1 30.0 - 36.0 g/dL   RDW 19.1 47.8 - 29.5 %   Platelets 209 150 - 400 K/uL   nRBC 0.0 0.0 - 0.2 %   Neutrophils Relative % 67 %   Neutro Abs 5.4 1.7 - 7.7 K/uL   Lymphocytes Relative 20 %   Lymphs Abs 1.6 0.7 - 4.0 K/uL   Monocytes Relative 8 %   Monocytes Absolute 0.6 0.1 - 1.0 K/uL   Eosinophils  Relative 4 %   Eosinophils Absolute 0.3 0.0 - 0.5 K/uL   Basophils Relative 1 %   Basophils Absolute 0.1 0.0 - 0.1 K/uL   Immature Granulocytes 0 %   Abs Immature Granulocytes 0.01 0.00 - 0.07 K/uL    Comment: Performed at [facility], [address] [place], [place] [address]  TSH     Status: None   Collection Time: 12/13/23  4:00 PM  Result Value Ref Range   TSH 2.182 0.350 - 4.500 uIU/mL    Comment: Performed by a 3rd Generation assay with a functional sensitivity of <=0.01 uIU/mL. Performed at [facility], [address] [place], [place] [address]   Ammonia     Status: None   Collection Time: 12/13/23  4:00 PM  Result Value Ref Range   Ammonia 34 9 - 35 umol/L    Comment: Performed at [facility], [address], [place], [place] [address]  Blood gas, venous (at [facility] and [facility])     Status: Abnormal   Collection Time: 12/13/23  4:30 PM  Result Value Ref Range   pH, Ven 7.31 7.25 - 7.43   pCO2, Ven 55 44 - 60 mmHg   pO2, Ven <31 (LL) 32 - 45 mmHg    Comment: CRITICAL RESULT CALLED TO, READ BACK BY AND VERIFIED WITH: D.S[place]IL[place],RN ON 12/13/2023 AT 1649 BY SL    Bicarbonate 27.7 20.0 - 28.0 mmol/L   Acid-Base Excess 0.1 0.0 - 2.0 mmol/L   O2 Saturation 42.3 %   Patient temperature 37.0     Comment: Performed at [facility], [address] [place], [place] [address]  Urinalysis, w/ Reflex to Culture (Infection Suspected) -Urine, Clean Catch     Status: Abnormal   Collection Time: 12/13/23  5:00 PM  Result Value Ref Range   Specimen Source URINE, CLEAN CATCH    Color, Urine YELLOW YELLOW   APPearance CLEAR CLEAR   Specific Gravity, Urine 1.012 1.005 - 1.030   pH 6.0 5.0 - 8.0   Glucose, UA NEGATIVE NEGATIVE mg/dL   Hgb urine dipstick SMALL (A) NEGATIVE   Bilirubin Urine NEGATIVE NEGATIVE  Ketones, ur NEGATIVE NEGATIVE mg/dL   Protein, ur NEGATIVE NEGATIVE mg/dL   Nitrite NEGATIVE NEGATIVE    Leukocytes,Ua MODERATE (A) NEGATIVE   RBC / HPF 0-5 0 - 5 RBC/hpf   WBC, UA 0-5 0 - 5 WBC/hpf    Comment:        Reflex urine culture not performed if WBC <=10, OR if Squamous epithelial cells >5. If Squamous epithelial cells >5 suggest recollection.    Bacteria, UA RARE (A) NONE SEEN   Squamous Epithelial / HPF 0-5 0 - 5 /HPF   Mucus PRESENT     Comment: Performed at [facility], [address] [place], [place] [address]  Rapid urine drug screen (hospital performed)     Status: Abnormal   Collection Time: 12/13/23  5:01 PM  Result Value Ref Range   Opiates NONE DETECTED NONE DETECTED   Cocaine NONE DETECTED NONE DETECTED   Benzodiazepines POSITIVE (A) NONE DETECTED   Amphetamines NONE DETECTED NONE DETECTED   Tetrahydrocannabinol NONE DETECTED NONE DETECTED   Barbiturates NONE DETECTED NONE DETECTED    Comment: (NOTE) DRUG SCREEN FOR MEDICAL PURPOSES ONLY.  IF CONFIRMATION IS NEEDED FOR ANY PURPOSE, NOTIFY LAB WITHIN 5 DAYS.  LOWEST DETECTABLE LIMITS FOR URINE DRUG SCREEN Drug Class                     Cutoff (ng/mL) Amphetamine and metabolites    1000 Barbiturate and metabolites    200 Benzodiazepine                 200 Opiates and metabolites        300 Cocaine and metabolites        300 THC                            50 Performed at [facility], [address], [place], [place] [address]   D-dimer, quantitative     Status: None   Collection Time: 12/14/23  1:25 AM  Result Value Ref Range   D-Dimer, Quant <0.27 0.00 - 0.50 ug/mL-FEU    Comment: (NOTE) At the manufacturer cut-off value of 0.5 g/mL FEU, this assay has a negative predictive value of 95-100%.This assay is intended for use in conjunction with a clinical pretest probability (PTP) assessment model to exclude pulmonary embolism (PE) and deep venous thrombosis (DVT) in outpatients suspected of PE or DVT. Results should be correlated with clinical  presentation. Performed at [facility], [address] [place], [place] [address]   Protime-INR     Status: Abnormal   Collection Time: 12/14/23  2:00 AM  Result Value Ref Range   Prothrombin Time 24.9 (H) 11.4 - 15.2 seconds   INR 2.2 (H) 0.8 - 1.2    Comment: (NOTE) INR goal varies based on device and disease states. Performed at [facility], [address] [place], [place] [address]   Basic metabolic panel     Status: Abnormal   Collection Time: 12/14/23  2:00 AM  Result Value Ref Range   Sodium 138 135 - 145 mmol/L   Potassium 3.8 3.5 - 5.1 mmol/L   Chloride 107 98 - 111 mmol/L   CO2 22 22 - 32 mmol/L   Glucose, Bld 79 70 - 99 mg/dL    Comment: Glucose reference range applies only to samples taken after fasting for at least 8 hours.   BUN 14  6 - 20 mg/dL   Creatinine, Ser 8.11 (H) 0.44 - 1.00 mg/dL   Calcium  8.6 (L) 8.9 - 10.3 mg/dL   GFR, Estimated 54 (L) >60 mL/min    Comment: (NOTE) Calculated using the CKD-EPI Creatinine Equation (2021)    Anion gap 9 5 - 15    Comment: Performed at [facility], [address], [place], [place] [address]  Sedimentation rate     Status: None   Collection Time: 12/14/23  2:00 AM  Result Value Ref Range   Sed Rate 5 0 - 22 mm/hr    Comment: Performed at [facility], [address], [place], [place] [address]  C-reactive protein     Status: None   Collection Time: 12/14/23  2:00 AM  Result Value Ref Range   CRP 0.7 <1.0 mg/dL    Comment: Performed at [facility], [address], [place], [place] [address]  CBC with Differential/Platelet     Status: Abnormal   Collection Time: 12/14/23  5:39 AM  Result Value Ref Range   WBC 9.7 4.0 - 10.5 K/uL   RBC 4.71 3.87 - 5.11 MIL/uL   Hemoglobin 14.0 12.0 - 15.0 g/dL   HCT 57.8 46.9 - 62.9 %   MCV 93.0 80.0 - 100.0 fL   MCH 29.7 26.0 - 34.0 pg   MCHC 32.0 30.0 - 36.0 g/dL   RDW 52.8 41.3 - 24.4 %    Platelets 226 150 - 400 K/uL   nRBC 0.0 0.0 - 0.2 %   Neutrophils Relative % 89 %   Neutro Abs 8.5 (H) 1.7 - 7.7 K/uL   Lymphocytes Relative 10 %   Lymphs Abs 0.9 0.7 - 4.0 K/uL   Monocytes Relative 1 %   Monocytes Absolute 0.1 0.1 - 1.0 K/uL   Eosinophils Relative 0 %   Eosinophils Absolute 0.0 0.0 - 0.5 K/uL   Basophils Relative 0 %   Basophils Absolute 0.0 0.0 - 0.1 K/uL   Immature Granulocytes 0 %   Abs Immature Granulocytes 0.04 0.00 - 0.07 K/uL    Comment: Performed at [facility], [address] [place], [place] [address]   DG Chest Portable 1 View Result Date: 12/13/2023 CLINICAL DATA:  Increasing weakness over the past 2 months. EXAM: PORTABLE CHEST 1 VIEW COMPARISON:  Chest x-ray dated November 12, 2018. FINDINGS: The heart size and mediastinal contours are within normal limits. Both lungs are clear. The visualized skeletal structures are unremarkable. IMPRESSION: No active disease. Electronically Signed   By: Aleta Anda M.D.   On: 12/13/2023 16:46   CT Head Wo Contrast Result Date: 12/13/2023 CLINICAL DATA:  Altered mental status. EXAM: CT HEAD WITHOUT CONTRAST TECHNIQUE: Contiguous axial images were obtained from the base of the skull through the vertex without intravenous contrast. RADIATION DOSE REDUCTION: This exam was performed according to the departmental dose-optimization program which includes automated exposure control, adjustment of the mA and/or kV according to patient size and/or use of iterative reconstruction technique. COMPARISON:  Head CT dated 05/27/2022. FINDINGS: Brain: The ventricles and sulci appropriate size for patient's age. Mild periventricular and deep white matter chronic microvascular ischemic changes noted. There is no acute intracranial hemorrhage. No mass effect or midline shift no extra-axial fluid collection. A 1 cm probable calcified meningioma along the right parietal convexity. Vascular: No hyperdense vessel or unexpected  calcification. Skull: Normal. Negative for fracture or focal lesion. Sinuses/Orbits: Mild mucoperiosteal thickening of paranasal sinuses. No air-fluid  level. The mastoid air cells are clear. Other: None IMPRESSION: 1. No acute intracranial pathology. 2. Mild chronic microvascular ischemic changes. Electronically Signed   By: Angus Bark M.D.   On: 12/13/2023 14:30    Pending Labs Unresulted Labs (From admission, onward)     Start     Ordered   12/15/23 0500  CBC  Tomorrow morning,   R        12/14/23 1311   12/15/23 0500  Magnesium   Tomorrow morning,   R        12/14/23 1311   12/15/23 0500  Comprehensive metabolic panel  Tomorrow morning,   R        12/14/23 1311   12/15/23 0500  Protime-INR  Daily,   R      12/14/23 1314   12/14/23 0500  CBC with Differential/Platelet  Tomorrow morning,   R        12/13/23 2259            Vitals/Pain Today's Vitals   12/14/23 0945 12/14/23 1330 12/14/23 1405 12/14/23 1600  BP: 129/75 103/77  111/70  Pulse: 93 (!) 103  (!) 103  Resp: 16 20  20   Temp: 97.6 F (36.4 C)  (!) 97.4 F (36.3 C)   TempSrc: Oral  Oral   SpO2: 99% 100%  100%  Weight:      Height:      PainSc:        Isolation Precautions No active isolations  Medications Medications  buPROPion  (WELLBUTRIN  XL) 24 hr tablet 300 mg (300 mg Oral Given 12/14/23 0939)  rosuvastatin  (CRESTOR ) tablet 10 mg (10 mg Oral Given 12/14/23 0939)  docusate sodium  (COLACE) capsule 100 mg (100 mg Oral Given 12/14/23 0939)  ondansetron  (ZOFRAN ) tablet 4 mg (has no administration in time range)    Or  ondansetron  (ZOFRAN ) injection 4 mg (has no administration in time range)  0.9 %  sodium chloride  infusion ( Intravenous New Bag/Given 12/14/23 1535)  sodium chloride  flush (NS) 0.9 % injection 10-40 mL (has no administration in time range)  cefTRIAXone  (ROCEPHIN ) 1 g in sodium chloride  0.9 % 100 mL IVPB (has no administration in time range)  warfarin (COUMADIN ) tablet 4 mg (4 mg Oral Given  12/14/23 1536)  Warfarin - Physician Dosing Inpatient (has no administration in time range)  ibuprofen  (ADVIL ) tablet 600 mg (600 mg Oral Given 12/14/23 1615)  sodium chloride  0.9 % bolus 1,000 mL (0 mLs Intravenous Stopped 12/13/23 2024)  cefTRIAXone  (ROCEPHIN ) 1 g in sodium chloride  0.9 % 100 mL IVPB (0 g Intravenous Stopped 12/13/23 2221)  lactated ringers  bolus 1,000 mL (0 mLs Intravenous Stopped 12/14/23 0606)    Mobility walks with person assist     Focused Assessments Acute cystitis, Altered mental status, Somnolence   R Recommendations: See Admitting Provider Note  Report given to:   Additional Notes: .

## 2023-12-14 NOTE — Evaluation (Signed)
 Physical Therapy Evaluation Patient Details Name: Lauren Chung MRN: [id number] DOB: May 03, 1966 Today's Date: 12/14/2023  History of Present Illness  This 58 year old female brought to ED 12/13/23 after syncopal episode,  hypersomnolence, memory deficits.Past medical history of lupus, seronegative rheumatoid arthritis, HTN, HLD, anxiety and depression, CKD stage IIIa, PTSD, history of CVA.  Clinical Impression  Pt admitted with above diagnosis.  Pt currently with functional limitations due to the deficits listed below (see PT Problem List). Pt will benefit from acute skilled PT to increase their independence and safety with mobility to allow discharge.     The patient presents with decreased memory, not oriented to date(December).  Patient  reports no recall of  fall and coming to hospital.   Patient assisted  to standing and took a few steps with RW. Patient began to cross feet, stated" Can I just sit down." Able to assist patient back onto stretcher and assist to return to supine. BP's supine 103/77 sitting  120/84, standing 111/70, unable to stay standing. 137/84 after return to supine.  Patient will need to be safe in ambulation to return home as she has many family in her home.   HR 90's.  Patient is independent at baseline.    If plan is discharge home, recommend the following: Two people to help with walking and/or transfers;A lot of help with bathing/dressing/bathroom;Assistance with cooking/housework;Assist for transportation;Supervision due to cognitive status   Can travel by private vehicle   No    Equipment Recommendations Rolling walker (2 wheels)  Recommendations for Other Services       Functional Status Assessment Patient has had a recent decline in their functional status and demonstrates the ability to make significant improvements in function in a reasonable and predictable amount of time.     Precautions / Restrictions Precautions Precautions: Fall Precaution  Comments: syncope. balance loss when stnading Restrictions Weight Bearing Restrictions Per Provider Order: No      Mobility  Bed Mobility Overal bed mobility: Needs Assistance Bed Mobility: Supine to Sit, Sit to Supine     Supine to sit: Min assist Sit to supine: Min assist   General bed mobility comments: close guard, at times  has decreased control  and slightly ballistic movements, trunk flexes forward, hold head down. Assisted  back into bed due to patient having weakness and dizziness and losing balance    Transfers Overall transfer level: Needs assistance Equipment used: Rolling walker (2 wheels) Transfers: Sit to/from Stand Sit to Stand: Min assist, Mod assist           General transfer comment: patient  unsteady when standing up from  stretcher, trunk swaying.    Ambulation/Gait Ambulation/Gait assistance: Mod assist, +2 safety/equipment, +2 physical assistance   Assistive device: Rolling walker (2 wheels) Gait Pattern/deviations: Staggering right, Staggering left       General Gait Details: patient took  4 steps forward then crossed legs, max support of 2  and had patient take steps backward , then had to lift patient onto stretcher as patient losing balance  Stairs            Wheelchair Mobility     Tilt Bed    Modified Rankin (Stroke Patients Only)       Balance Overall balance assessment: Needs assistance Sitting-balance support: Bilateral upper extremity supported, Feet supported Sitting balance-Leahy Scale: Poor Sitting balance - Comments: sits with head forward, leaning forward   Standing balance support: Bilateral upper extremity supported, During functional activity, Reliant on assistive  device for balance Standing balance-Leahy Scale: Poor Standing balance comment: required external support                             Pertinent Vitals/Pain Pain Assessment Pain Assessment: Faces Faces Pain Scale: Hurts even  more Pain Location: back of head and left frontal Pain Descriptors / Indicators: Discomfort Pain Intervention(s): Limited activity within patient's tolerance, Patient requesting pain meds-RN notified    Home Living Family/patient expects to be discharged to:: Private residence Living Arrangements: Children;Other relatives Available Help at Discharge: Family;Available 24 hours/day Type of Home: House Home Access: Level entry     Alternate Level Stairs-Number of Steps: flight with landing Home Layout: Two level Home Equipment: None Additional Comments: pt has autistic  son for whom she  cares for, also 2 grandchildren and another daughter in the home    Prior Function Prior Level of Function : Independent/Modified Independent                     Extremity/Trunk Assessment   Upper Extremity Assessment Upper Extremity Assessment: Overall WFL for tasks assessed    Lower Extremity Assessment Lower Extremity Assessment: Generalized weakness    Cervical / Trunk Assessment Cervical / Trunk Assessment: Normal  Communication   Communication Communication: No apparent difficulties  Cognition Arousal: Alert Behavior During Therapy: Flat affect Overall Cognitive Status: Impaired/Different from baseline Area of Impairment: Orientation, Attention, Memory, Safety/judgement, Awareness                 Orientation Level: Place, Time, Situation Current Attention Level: Sustained Memory: Decreased recall of precautions, Decreased short-term memory   Safety/Judgement: Decreased awareness of deficits, Decreased awareness of safety Awareness: Emergent            General Comments      Exercises     Assessment/Plan    PT Assessment Patient needs continued PT services  PT Problem List Decreased strength;Decreased cognition;Decreased knowledge of precautions;Decreased mobility;Decreased activity tolerance;Decreased knowledge of use of DME;Pain;Decreased safety  awareness       PT Treatment Interventions DME instruction;Functional mobility training;Balance training;Patient/family education;Therapeutic activities;Gait training;Stair training;Therapeutic exercise;Cognitive remediation    PT Goals (Current goals can be found in the Care Plan section)  Acute Rehab PT Goals Patient Stated Goal: know what is wrong PT Goal Formulation: With patient Time For Goal Achievement: 12/28/23 Potential to Achieve Goals: Fair    Frequency Min 1X/week     Co-evaluation               AM-PAC PT "6 Clicks" Mobility  Outcome Measure Help needed turning from your back to your side while in a flat bed without using bedrails?: A Little Help needed moving from lying on your back to sitting on the side of a flat bed without using bedrails?: A Lot Help needed moving to and from a bed to a chair (including a wheelchair)?: A Lot Help needed standing up from a chair using your arms (e.g., wheelchair or bedside chair)?: A Lot Help needed to walk in hospital room?: Total Help needed climbing 3-5 steps with a railing? : Total 6 Click Score: 11    End of Session   Activity Tolerance: Patient limited by fatigue;Patient limited by pain;Treatment limited secondary to medical complications (Comment) Patient left: in bed;with family/visitor present Nurse Communication: Mobility status (patient dizziness and loss of balance) PT Visit Diagnosis: Unsteadiness on feet (R26.81);Muscle weakness (generalized) (M62.81);Difficulty in walking, not elsewhere  classified (R26.2)    Time: 1610-9604 PT Time Calculation (min) (ACUTE ONLY): 24 min   Charges:   PT Evaluation $PT Eval Low Complexity: 1 Low PT Treatments $Therapeutic Activity: 8-22 mins PT General Charges $$ ACUTE PT VISIT: 1 Visit         Abelina Hoes PT Acute Rehabilitation Services Office [phone] Weekend pager-[phone]   Dareen Ebbing 12/14/2023, 4:56 PM

## 2023-12-14 NOTE — Progress Notes (Signed)
 Progress Note   Patient: Lauren Chung ZOX:[id number] DOB: 06/25/1966 DOA: 12/13/2023     1 DOS: the patient was seen and examined on 12/14/2023   Brief hospital course:  This 58 year old female with past medical history of lupus, seronegative rheumatoid arthritis, HTN, HLD, anxiety and depression, CKD stage IIIa, PTSD, history of CVA.  [name] , Plaquenil , daily prednisone .  Patient has no memory of what occurred today.  [name].   Assessment and Plan:  Acute metabolic encephalopathy - Admitted secondary to hypersomnolence and memory deficit.  Etiologies include possible underlying UTI, polypharmacy, or other infectious etiology such as meningitis.  Patient this morning appears alert, talkative.  Admits she does not remember coming to the hospital however appears ANO x 3 right now.  Continue to monitor closely.  Concern for meningitis - Given patient's reported altered mental status, headache, intermittent fevers for the last 2 months, some initial concern for meningitis.  No LP performed however.  Brudzinski sign negative, patient afebrile right now, no leukocytosis.  Sed rate negative.  Initially receiving IV Rocephin  2 g every 12 hours and IV vancomycin .  However after discussion with patient, stated she has not had fevers recently.  Lowering suspicion of meningitis, will discontinue vancomycin , decrease dose of Rocephin .  Concern for urinary tract infection - UA showing LE and bacteria but otherwise no leukocytosis.  They be contributing etiology to patient's encephalopathy.  Reduce IV ceftriaxone  to 1 g every 24 hours.  Undertreat urine culture.  Possible syncope and collapse - Patient stated that she was looking at something in the closet and passed out.  Does not remember browning out or feeling dizzy.  Unclear history given patient also stated she cannot remember why she was here.  ECG normal.  TSH normal.  Will place patient on  telemetry for now.  Monitor for repeat syncopal type episodes.  Likely polypharmacy - Patient on Xanax  and Ambien  at home.  Reported that family members had noted her feeling increasingly tired and hypersomnolent.  Would also explain patient's memory loss and even syncope.  Will hold off on Xanax  and other sedative type occasions for now.  Continue to monitor closely.  Lupus/rheumatoid arthritis with immunocompromise state - Patient taking Plaquenil , leflunomide , low-dose prednisone  2.5 mg daily at baseline.  More susceptible to infection however no leukocytosis, fever, possible UTI.  Chest x-ray personally reviewed noting no consolidations.  Will hold DMARDs, prednisone  for now.  Can likely resume upon discharge.  Hypertension/hyperlipidemia - Crestor  resumed.  Anxiety/depression/PTSD - Wellbutrin  on board.  As mentioned above, benzodiazepines on hold.  Will monitor for any withdrawal type symptoms.  Acute kidney injury on CKD 3 - Likely prerenal etiology, mild dehydration.  Showing improvement after IV fluid hydration.  Monitor urine output recheck kidney function in AM.  History of PE - Continue chronic warfarin 4 mg daily.  INR 2.2.  Recheck INR in AM.  Physical debilitation and muscle weakness - Will order PT eval.     Subjective: Discussion with the patient this morning, currently denying any fevers, chills, shortness of breath, chest pain, nausea, vomiting, abdominal pain.  States she felt very weak, barely able to sit up at the front of the bed this morning.  States she ended up falling forward over the bed.  Denies any injury or loss of consciousness.  States she cannot remember coming to the hospital.  Confers that she has had intermittent fevers for the last 2 months, mostly febrile and December and  January.  No recent fever.  Denies any recent change in medications.  Denies any recent illness.  States she does not eat well at home.  Stated she also had an episode of syncope where  she was reaching for something in the closet and passed out.  Family heard a thud upstairs and subsequently called EMS.  Currently however feels slightly improved if not concerned.  Physical Exam: Vitals:   12/14/23 0245 12/14/23 0300 12/14/23 0550 12/14/23 0945  BP: 109/74 109/69 102/76 129/75  Pulse: 83 84 98 93  Resp:   (!) 24 16  Temp:   97.8 F (36.6 C) 97.6 F (36.4 C)  TempSrc:   Oral Oral  SpO2: 100% 100% 91% 99%  Weight:      Height:       GENERAL:  Alert, pleasant, no acute distress  HEENT:  EOMI CARDIOVASCULAR:  RRR, no murmurs appreciated RESPIRATORY:  Clear to auscultation, no wheezing, rales, or rhonchi GASTROINTESTINAL:  Soft, nontender, nondistended EXTREMITIES:  No LE edema bilaterally NEURO:  No new focal deficits appreciated SKIN:  No rashes noted PSYCH:  Appropriate mood and affect, anxious  Data Reviewed:  Chest x-ray personally reviewed showing no frank consolidation or opacities.  Family Communication: None at bedside  Disposition: Status is: Inpatient Remains inpatient appropriate because: Continued IV antibiotic regiment for UTI and syncope/encephalopathy.  Planned Discharge Destination: Home    Time spent: 45 minutes  Author: Jodeane Mulligan, DO 12/14/2023 12:49 PM  For on call review [email or web address].

## 2023-12-14 NOTE — ED Notes (Signed)
 Pt pulled out IV , unable to place another line , IV team order placed

## 2023-12-15 ENCOUNTER — Inpatient Hospital Stay (HOSPITAL_COMMUNITY): Payer: 59

## 2023-12-15 ENCOUNTER — Encounter (HOSPITAL_COMMUNITY): Payer: Self-pay | Admitting: Family Medicine

## 2023-12-15 LAB — COMPREHENSIVE METABOLIC PANEL
ALT: 17 U/L (ref 0–44)
AST: 22 U/L (ref 15–41)
Albumin: 3.1 g/dL — ABNORMAL LOW (ref 3.5–5.0)
Alkaline Phosphatase: 71 U/L (ref 38–126)
Anion gap: 7 (ref 5–15)
BUN: 20 mg/dL (ref 6–20)
CO2: 21 mmol/L — ABNORMAL LOW (ref 22–32)
Calcium: 7.9 mg/dL — ABNORMAL LOW (ref 8.9–10.3)
Chloride: 111 mmol/L (ref 98–111)
Creatinine, Ser: 1.2 mg/dL — ABNORMAL HIGH (ref 0.44–1.00)
GFR, Estimated: 53 mL/min — ABNORMAL LOW (ref >60)
Glucose, Bld: 100 mg/dL — ABNORMAL HIGH (ref 70–99)
Potassium: 3.2 mmol/L — ABNORMAL LOW (ref 3.5–5.1)
Sodium: 139 mmol/L (ref 135–145)
Total Bilirubin: 0.6 mg/dL (ref 0.0–1.2)
Total Protein: 5.9 g/dL — ABNORMAL LOW (ref 6.5–8.1)

## 2023-12-15 LAB — CBC
HCT: 36.2 % (ref 36.0–46.0)
Hemoglobin: 11.5 g/dL — ABNORMAL LOW (ref 12.0–15.0)
MCH: 29.2 pg (ref 26.0–34.0)
MCHC: 31.8 g/dL (ref 30.0–36.0)
MCV: 91.9 fL (ref 80.0–100.0)
Platelets: 192 10*3/uL (ref 150–400)
RBC: 3.94 MIL/uL (ref 3.87–5.11)
RDW: 13.6 % (ref 11.5–15.5)
WBC: 10.7 10*3/uL — ABNORMAL HIGH (ref 4.0–10.5)
nRBC: 0 % (ref 0.0–0.2)

## 2023-12-15 LAB — MAGNESIUM: Magnesium: 1.7 mg/dL (ref 1.7–2.4)

## 2023-12-15 LAB — PROTIME-INR
INR: 2.7 — ABNORMAL HIGH (ref 0.8–1.2)
Prothrombin Time: 28.6 s — ABNORMAL HIGH (ref 11.4–15.2)

## 2023-12-15 MED ORDER — GADOBUTROL 1 MMOL/ML IV SOLN
8.0000 mL | Freq: Once | INTRAVENOUS | Status: AC | PRN
  Administered 2023-12-15: 8 mL via INTRAVENOUS

## 2023-12-15 MED ORDER — POTASSIUM CHLORIDE CRYS ER 20 MEQ PO TBCR
40.0000 meq | EXTENDED_RELEASE_TABLET | ORAL | Status: AC
  Administered 2023-12-15 (×2): 40 meq via ORAL
  Filled 2023-12-15 (×2): qty 2

## 2023-12-15 NOTE — Plan of Care (Signed)

## 2023-12-15 NOTE — Progress Notes (Signed)
   12/15/23 1505  TOC Brief Assessment  Insurance and Status Reviewed  Patient has primary care physician Yes Azucena Cecil, David, MD)  Home environment has been reviewed Yes from home with handicap son and daughter  Prior level of function: Independent  Prior/Current Home Services No current home services  Social Drivers of Health Review SDOH reviewed no interventions necessary  Readmission risk has been reviewed Yes  Transition of care needs no transition of care needs at this time

## 2023-12-15 NOTE — Progress Notes (Signed)
PT tried working with Pt earlier,  and she developed severe headache 10/10, dizziness and blurry vision while walking. It further worsen when she looked at the light.  Lauren Slade MD made aware, Advil  600 mg administered ,CT -Ve, NIH  Stroke Scale assessed, MRI ordered

## 2023-12-15 NOTE — TOC Initial Note (Signed)
Transition of Care [facility]) - Initial/Assessment Note    Patient Details  Name: Lauren Chung MRN: [id number] Date of Birth: 01-Aug-1966  [facility] CM/SW Contact:    Beckie Busing, RN Phone [phone]  12/15/2023, 3:06 PM  Clinical Narrative:                 CM at bedside for SNF referral for short term rehab. CM at bedside introduced self and explained referral. Patient explains that she understands that she is weak and has had falls but she is the caregiver for her autistic son and that she is worried about his well being if he has to go to rehab. Patient is tearful and states that she really just doesn't know what to do. CM has explained the process of short term rehab. Patient states that she thinks it will be best for her to go home although she knows that she is weak. Patient does not give CM consent for SNF workup because she says she doesn't know what she wants to do. TOC will continue to follow.     Barriers to Discharge: No Barriers Identified   Patient Goals and CMS Choice            Expected Discharge Plan and Services                                              Prior Living Arrangements/Services                       Activities of Daily Living   ADL Screening (condition at time of admission) Independently performs ADLs?: Yes (appropriate for developmental age) Is the patient deaf or have difficulty hearing?: No Does the patient have difficulty seeing, even when wearing glasses/contacts?: No Does the patient have difficulty concentrating, remembering, or making decisions?: No  Permission Sought/Granted                  Emotional Assessment              Admission diagnosis:  Somnolence [R40.0] Hypersomnolence [G47.10] Acute cystitis without hematuria [N30.00] Altered mental status, unspecified altered mental status type [R41.82] Patient Active Problem List   Diagnosis Date Noted   Hypersomnolence  12/13/2023   Abnormal urinalysis 12/13/2023   Seronegative rheumatoid arthritis (HCC) 12/13/2023   HLD (hyperlipidemia) 12/13/2023   CKD (chronic kidney disease), stage III (HCC) 12/13/2023   Iron deficiency anemia 05/26/2022   Left-sided weakness 03/05/2022   Dysarthria 03/05/2022   Normocytic anemia 09/02/2018   Fever 09/01/2018   Sepsis (HCC) 11/16/2017   HTN (hypertension) 11/16/2017   History of CVA (cerebrovascular accident)    History of pulmonary embolism    SLE (systemic lupus erythematosus) (HCC)    Anxiety and depression    AKI (acute kidney injury) (HCC)    Severe recurrent major depression without psychotic features (HCC) 09/30/2016    Class: Chronic   PCP:  Tally Joe, MD Pharmacy:   [facility] [phone] Ginette Otto, [place] - [address] [place] [address] Phone: [phone] Fax: [phone]     Social Drivers of Health (SDOH) Social History: SDOH Screenings   Food Insecurity: No Food Insecurity (12/14/2023)  Housing: Low Risk  (12/14/2023)  Transportation Needs: No Transportation Needs (12/14/2023)  Utilities: Not At Risk (  12/14/2023)  Social Connections: Socially Isolated (12/14/2023)  Tobacco Use: Low Risk  (12/15/2023)   SDOH Interventions:     Readmission Risk Interventions     No data to display

## 2023-12-15 NOTE — Progress Notes (Signed)
Physical Therapy Treatment Patient Details Name: Lauren Chung MRN: [id number] DOB: 12-05-1965 Today's Date: 12/15/2023   History of Present Illness This 58 year old female brought to ED 12/13/23 after syncopal episode,  hypersomnolence, memory deficits.Past medical history of lupus, seronegative rheumatoid arthritis, HTN, HLD, anxiety and depression, CKD stage IIIa, PTSD, history of CVA.    PT Comments  At start of session pt reported feeling less dizzy. She wanted to try to walk without a RW, upon doing so she staggered and had a significant loss of balance. She was then able to ambulate 65' with a RW, distance limited by a spinning sensation. Pt reports spinning sensation with head turns as well. She would benefit from a vestibular evaluation. Pt reported significant headache at end of session, RN notified.     If plan is discharge home, recommend the following: A lot of help with walking and/or transfers;A lot of help with bathing/dressing/bathroom;Assistance with cooking/housework;Assist for transportation;Help with stairs or ramp for entrance   Can travel by private vehicle     No  Equipment Recommendations  Rolling walker (2 wheels)    Recommendations for Other Services       Precautions / Restrictions Precautions Precautions: Fall Precaution/Restrictions Comments: syncope. balance loss when stnading Restrictions Weight Bearing Restrictions Per Provider Order: No     Mobility  Bed Mobility               General bed mobility comments: up at edge of bed    Transfers Overall transfer level: Needs assistance Equipment used: Rolling walker (2 wheels) Transfers: Sit to/from Stand Sit to Stand: Contact guard assist                Ambulation/Gait Ambulation/Gait assistance: Min assist Gait Distance (Feet): 18 Feet Assistive device: Rolling walker (2 wheels) Gait Pattern/deviations: Step-through pattern Gait velocity: decr     General Gait Details: unsteady  when attempting to walk without RW, staggered and required min/mod assist. So used RW for rest of walk. Distance limited by dizziness. Pt reported headache at end of session, RN notified.   Stairs             Wheelchair Mobility     Tilt Bed    Modified Rankin (Stroke Patients Only)       Balance   Sitting-balance support: No upper extremity supported, Feet supported Sitting balance-Leahy Scale: Good     Standing balance support: During functional activity, Bilateral upper extremity supported, Reliant on assistive device for balance Standing balance-Leahy Scale: Poor                              Communication Communication Communication: No apparent difficulties  Cognition Arousal: Alert Behavior During Therapy: WFL for tasks assessed/performed   PT - Cognitive impairments: Memory                       PT - Cognition Comments: after walking pt had spinning sensation, headache, and some foggy memory; oriented to self, location, month Following commands: Intact      Cueing    Exercises      General Comments General comments (skin integrity, edema, etc.): pt reported spinning sensation while walking, and with head turns to L and R      Pertinent Vitals/Pain Pain Assessment Pain Score: 8  Pain Location: headache Pain Descriptors / Indicators: Aching Pain Intervention(s): Limited activity within patient's tolerance, Monitored during session, Patient requesting pain  meds-RN notified    Home Living Family/patient expects to be discharged to:: Private residence   Available Help at Discharge: Family;Available 24 hours/day Type of Home: House       Alternate Level Stairs-Number of Steps: flight with landing Home Layout: Two level Home Equipment: None Additional Comments: pt has autistic  son for whom she  cares for, also 2 grandchildren and another daughter in the home    Prior Function            PT Goals (current goals can now  be found in the care plan section) Acute Rehab PT Goals Patient Stated Goal: know what is wrong PT Goal Formulation: With patient Time For Goal Achievement: 12/28/23 Potential to Achieve Goals: Fair Progress towards PT goals: Progressing toward goals    Frequency    Min 1X/week      PT Plan      Co-evaluation              AM-PAC PT "6 Clicks" Mobility   Outcome Measure  Help needed turning from your back to your side while in a flat bed without using bedrails?: A Little Help needed moving from lying on your back to sitting on the side of a flat bed without using bedrails?: A Little Help needed moving to and from a bed to a chair (including a wheelchair)?: A Little Help needed standing up from a chair using your arms (e.g., wheelchair or bedside chair)?: A Little Help needed to walk in hospital room?: A Little Help needed climbing 3-5 steps with a railing? : Total 6 Click Score: 16    End of Session Equipment Utilized During Treatment: Gait belt Activity Tolerance: Patient limited by fatigue;Patient limited by pain;Treatment limited secondary to medical complications (Comment) Patient left: in chair;with call bell/phone within reach;with nursing/sitter in room;with family/visitor present Nurse Communication: Mobility status;Patient requests pain meds PT Visit Diagnosis: Unsteadiness on feet (R26.81);Muscle weakness (generalized) (M62.81);Difficulty in walking, not elsewhere classified (R26.2);Dizziness and giddiness (R42)     Time: 1610-9604 PT Time Calculation (min) (ACUTE ONLY): 19 min  Charges:    $Gait Training: 8-22 mins PT General Charges $$ ACUTE PT VISIT: 1 Visit                     Ralene Bathe Kistler PT 12/15/2023  Acute Rehabilitation Services  Office [phone]

## 2023-12-15 NOTE — Progress Notes (Signed)
After pt walked in room w/PT, pt c/o dizziness. After sitting down in chair, pt states she has a 10/10 HA. Ibuprofen given. Pt very weak on transfer back to bed. NIHSS completed. Dr. Sharlene Dory made aware. MRI of head ordered.

## 2023-12-15 NOTE — Progress Notes (Addendum)
Pt states headache and dizziness has resolved after rest. Now sitting up in bed comfortably.

## 2023-12-15 NOTE — Progress Notes (Addendum)
Progress Note   Patient: Lauren Chung WUJ:[id number] DOB: Jan 12, 1966 DOA: 12/13/2023     2 DOS: the patient was seen and examined on 12/15/2023   Brief hospital course:  This 58 year old female with past medical history of lupus, seronegative rheumatoid arthritis, HTN, HLD, anxiety and depression, CKD stage IIIa, PTSD, history of CVA.  [name], Plaquenil, daily prednisone.  Patient has no memory of what occurred prior to admission.  [name].   Assessment and Plan:  Acute metabolic encephalopathy - Admitted secondary to hypersomnolence and memory deficit.  Etiologies include possible underlying UTI, polypharmacy, or other infectious etiology such as meningitis.  Patient this morning appears alert, talkative.  Admits she does not remember coming to the hospital however appears A&O x 3 right now.  Continue to monitor closely.  Concern for meningitis - Given patient's reported altered mental status, headache, intermittent fevers for the last 2 months, some initial concern for meningitis.  No LP performed however.  Brudzinski sign negative, patient afebrile right now, no leukocytosis.  Sed rate negative.  Initially receiving IV Rocephin 2 g every 12 hours and IV vancomycin.  However after discussion with patient, stated she has not had fevers recently.  Lowering suspicion of meningitis, will discontinue vancomycin, decrease dose of Rocephin.  Likely urinary tract infection - UA showing LE and bacteria but otherwise no leukocytosis.  They be contributing etiology to patient's encephalopathy.  Continue IV ceftriaxone to 1 g every 24 hours.  Monitor urine culture.  Possible syncope and collapse - Patient stated that she was looking at something in the closet and passed out.  Does not remember browning out or feeling dizzy.  Unclear history given patient also stated she cannot remember why she was here.  ECG normal.  TSH normal.  Telemetry reviewed  personally, so far no observed arrhythmias or bradycardia.  Continue on telemetry for now.  Monitor for repeat syncopal type episodes.  Likely polypharmacy - Patient on Xanax and Ambien at home.  Reported that family members had noted her feeling increasingly tired and hypersomnolent.  Would also explain patient's memory loss and even syncope.  Patient appearing much more alert.  Continue to hold off on Xanax and other sedative type occasions for now.  Continue to monitor closely.  Lupus/rheumatoid arthritis with immunocompromise state - Patient reportedly taking rituximab, Plaquenil, leflunomide, low-dose prednisone 2.5 mg daily at baseline.  More susceptible to infection however no leukocytosis, fever, but possible UTI.  Chest x-ray personally reviewed noting no consolidations.  Will hold DMARDs, prednisone for now.  Can likely resume upon discharge.  Hypertension/hyperlipidemia - Crestor resumed.  Anxiety/depression/PTSD - Wellbutrin on board.  As mentioned above, benzodiazepines on hold.  Will monitor for any withdrawal type symptoms.  Acute kidney injury on CKD 3 - Likely prerenal etiology, mild dehydration.  Showing improvement after IV fluid hydration.  Monitor urine output recheck kidney function in AM.  History of PE - Continue chronic warfarin 4 mg daily.  INR 2.7 this morning.  Recheck INR in AM.  Physical debilitation with possible unilateral muscle weakness - Followed closely by PT.  Assessment noted some mild appreciable unilateral weakness.  Unclear chronicity at this point.  Initial CT negative for acute pathology.  Given pt's headache, dizziness, and other nonspecific symptoms, will go ahead and order MRI to evaluate for more insidious etiology.  Currently would benefit from acute skilled nursing.  Goals of care - PT likely recommending STR.  Will work with patient, case  management, on disposition planning.     Subjective: Patient this morning resting in bed comfortably.   Appears more alert, talkative, less sluggish.  States she feels improved.  No episodes of syncope, fever, shortness of breath, chest pain, nausea, vomiting, abdominal pain.  Was able to work with physical therapy but noted she was profoundly weak.  Physical Exam: Vitals:   12/14/23 2257 12/15/23 0250 12/15/23 0651 12/15/23 1237  BP: 123/77 119/76 116/67 112/77  Pulse: 96 84 81 96  Resp: 14 14 14 16   Temp: (!) 97.5 F (36.4 C) 97.7 F (36.5 C) 97.6 F (36.4 C) 98.4 F (36.9 C)  TempSrc: Oral Oral Oral Oral  SpO2: 98% 98% 98% 100%  Weight:      Height:       GENERAL:  Alert, pleasant, no acute distress  HEENT:  EOMI CARDIOVASCULAR:  RRR, no murmurs appreciated RESPIRATORY:  Clear to auscultation, no wheezing, rales, or rhonchi GASTROINTESTINAL:  Soft, nontender, nondistended EXTREMITIES:  No LE edema bilaterally NEURO:  No new focal deficits appreciated SKIN:  No rashes noted PSYCH:  Appropriate mood and affect, anxious  Data Reviewed:  ECG and telemetry personally reviewed, noting no acute arrhythmia or bradycardia.  Family Communication: None at bedside  Disposition: Status is: Inpatient Remains inpatient appropriate because: Continued IV antibiotic regiment for UTI and syncope/encephalopathy.  Planned Discharge Destination: STR     Time spent: 32 minutes  Author: Deanna Artis, DO 12/15/2023 12:41 PM  For on call review [email or web address].

## 2023-12-15 NOTE — Progress Notes (Signed)
Physical Therapy Treatment Patient Details Name: Lauren Chung MRN: [id number] DOB: 05-Mar-1966 Today's Date: 12/15/2023   History of Present Illness Pt is 58 yo female presented on 12/13/23 after syncopal episode with hypersomnolence and memory deficit.  Head CT on 2/9 negative. Admitted with acute metabolic encephlopathy and likely UTI.  Some concern for meningitis initially -now felt to be less likely. Orthostatic BP were negative.  Pt with hx including but not limited to  lupus, seronegative rheumatoid arthritis, HTN, HLD, anxiety and depression, CKD stage IIIa, PTSD, history of CVA    PT Comments  Pt seen by PT earlier for mobility and developed further dizziness/spinning and sudden onset of severe headache.  This PT was asked to see pt for further vestibular assessment/involvement.  Pt difficult to assess due to sudden and severe onset of headache with difficulty keeping her eyes open.  She had very poor balance with transfers requiring mod A.  Did note uncoordinated and slow movement  with tracking with L eye slower than R and possibly deviating laterally.  Was difficult to assess for any nystagmus as pt keeping eyes squinted.  RN was present and also notified MD of severe onset headache, worsened dizziness, and deviations in L eye along with poor balance.  MD has ordered MRI for further assessment.     If plan is discharge home, recommend the following: A lot of help with walking and/or transfers;A lot of help with bathing/dressing/bathroom;Assistance with cooking/housework;Assist for transportation;Help with stairs or ramp for entrance   Can travel by private vehicle     No  Equipment Recommendations  Rolling walker (2 wheels)    Recommendations for Other Services       Precautions / Restrictions Precautions Precautions: Fall Precaution/Restrictions Comments: syncope. balance loss when stnading     Mobility  Bed Mobility Overal bed mobility: Needs Assistance Bed Mobility: Supine  to Sit, Sit to Supine     Supine to sit: Supervision Sit to supine: Supervision        Transfers Overall transfer level: Needs assistance Equipment used: Rolling walker (2 wheels) Transfers: Sit to/from Stand, Bed to chair/wheelchair/BSC Sit to Stand: Min assist   Step pivot transfers: Mod assist       General transfer comment: STS x 2 with min A and RW; mod A to stabilize during transfer with max cues    Ambulation/Gait Ambulation/Gait assistance: Mod assist Gait Distance (Feet): 3 Feet Assistive device: Rolling walker (2 wheels) Gait Pattern/deviations: Step-to pattern, Decreased stride length Gait velocity: decreased     General Gait Details: Started to try to walk to bed from chair but pt very unsteady, scissor gait, keeping eyes closed, unable to keep hands on RW, reports severe HA and dizzienss.  Opted to return to chair and complete pivot   Stairs             Wheelchair Mobility     Tilt Bed    Modified Rankin (Stroke Patients Only)       Balance Overall balance assessment: Needs assistance Sitting-balance support: No upper extremity supported, Feet supported Sitting balance-Leahy Scale: Fair     Standing balance support: During functional activity, Bilateral upper extremity supported, Reliant on assistive device for balance Standing balance-Leahy Scale: Poor Standing balance comment: UE support and mod A                            Communication Communication Communication: No apparent difficulties  Cognition Arousal: Alert Behavior  During Therapy: Anxious, Restless   PT - Cognitive impairments: Memory, Problem solving                       PT - Cognition Comments: Pt with severe headache and was internally distracted by pain.  She had poor memory and difficulty providing history.  Poor safety awareness - tried to walk back to bed or transfer and kept trying to do so with eyes closed b/c of headache.  Tried to cue to  open for transfers.        Cueing    Exercises      General Comments  Vestibular Assessment:  Pt with severe headache with sudden onset when walking for mobility treatment earlier with other PT.  States head was fine and then sudden onset headache,dizziness, blurry vision with walking and looked up and right. Describes dizziness as room spinning.    Pt admitted with syncopal episode - orthostatic were negative and initial head CT 2/9 was negative.    Objective: Pt with severe headache and difficulty opening eyes for assessment.  Room was dark , she had been given ibuprofen for HA, and provided cold rag.  Pt wants to keep her face in her pillow.  She did attempt to open eyes but frequently squinting making difficult to assess.  When open she reports entire visual field is blurry.   Spontaneous nystagmus: not noted but again pt squinting Gaze inducted nystagmus: possible L beat with looking L but again difficult to assess Head Thrust: cannot tolerate Test of Skew: possible lateral deviation L eye, again difficult to assess EOEM/smooth pursuit: sluggish, uncoordinated, L eye lagging compared to right [employer]: not able to tolerate, also does not seem consistent with BPPV at this time     Pertinent Vitals/Pain Pain Assessment Pain Assessment: Faces Faces Pain Scale: Hurts worst Pain Location: headache Pain Descriptors / Indicators: Aching, Sharp, Throbbing Pain Intervention(s): Limited activity within patient's tolerance, Monitored during session, Premedicated before session    Home Living Family/patient expects to be discharged to:: Private residence   Available Help at Discharge: Family;Available 24 hours/day Type of Home: House       Alternate Level Stairs-Number of Steps: flight with landing Home Layout: Two level Home Equipment: None Additional Comments: pt has autistic  son for whom she  cares for, also 2 grandchildren and another daughter in the home    Prior  Function            PT Goals (current goals can now be found in the care plan section) Acute Rehab PT Goals Patient Stated Goal: know what is wrong PT Goal Formulation: With patient Time For Goal Achievement: 12/28/23 Potential to Achieve Goals: Fair Progress towards PT goals: Not progressing toward goals - comment (limited by headache/dizziness)    Frequency    Min 1X/week      PT Plan      Co-evaluation              AM-PAC PT "6 Clicks" Mobility   Outcome Measure  Help needed turning from your back to your side while in a flat bed without using bedrails?: A Little Help needed moving from lying on your back to sitting on the side of a flat bed without using bedrails?: A Little Help needed moving to and from a bed to a chair (including a wheelchair)?: A Lot Help needed standing up from a chair using your arms (e.g., wheelchair or bedside chair)?: A  Lot Help needed to walk in hospital room?: Total Help needed climbing 3-5 steps with a railing? : Total 6 Click Score: 12    End of Session Equipment Utilized During Treatment: Gait belt Activity Tolerance: Patient limited by pain Patient left: with call bell/phone within reach;in bed;with family/visitor present;with bed alarm set Nurse Communication: Mobility status;Patient requests pain meds;Other (comment) (RN present.  Notified RN and MD of sudden onset HA and ocularmotor deficits and balance deficits) PT Visit Diagnosis: Unsteadiness on feet (R26.81);Muscle weakness (generalized) (M62.81);Difficulty in walking, not elsewhere classified (R26.2);Dizziness and giddiness (R42)     Time: 1530-1550 PT Time Calculation (min) (ACUTE ONLY): 20 min  Charges:     $Therapeutic Activity: 8-22 mins PT General Charges $$ ACUTE PT VISIT: 1 Visit                     Anise Salvo, PT Acute Rehab [facility] [phone]    Rayetta Humphrey 12/15/2023, 4:50 PM

## 2023-12-15 NOTE — Progress Notes (Signed)
Patient had a nosebleed last night. She reports that it occurs about every 2 days. Stopped after several minutes of holding pressure. Patient also received afrin last night to help stop the bleeding.Lauren Chung

## 2023-12-15 NOTE — Evaluation (Signed)
Occupational Therapy Evaluation Patient Details Name: Lauren Chung MRN: [id number] DOB: Nov 14, 1965 Today's Date: 12/15/2023   History of Present Illness   History of Present Illness: This 58 year old female brought to ED 12/13/23 after syncopal episode,  hypersomnolence, memory deficits.Past medical history of lupus, seronegative rheumatoid arthritis, HTN, HLD, anxiety and depression, CKD stage IIIa, PTSD, history of CVA.     Clinical Impressions Pt presents with decline in function and safety with ADLs and ADL mobility with impaired balance and endurance. PTA pt lived at home with her son who she cares for (has autism), her daughter and 2 grandchildren. Pt was Ind with ADLs, IADLs, home mgt, drives and mobility with no  AD. Pt currently requires min A with LB ADLs and mobility with RW due to unsteadiness, mild c/o dizziness once pt seated on BSC. Pt does not recall falling at home and being admitted to hospital. Pt very pleasant, cooperative and eager to return home. OT will follow acutely to maximize level of function and safety     If plan is discharge home, recommend the following:   A little help with bathing/dressing/bathroom;A little help with walking and/or transfers;Assistance with cooking/housework;Assist for transportation;Help with stairs or ramp for entrance     Functional Status Assessment   Patient has had a recent decline in their functional status and demonstrates the ability to make significant improvements in function in a reasonable and predictable amount of time.     Equipment Recommendations   Tub/shower bench     Recommendations for Other Services         Precautions/Restrictions   Precautions Precautions: Fall Precaution/Restrictions Comments: syncope. balance loss when stnading Restrictions Weight Bearing Restrictions Per Provider Order: No     Mobility Bed Mobility Overal bed mobility: Needs Assistance Bed Mobility: Supine to Sit, Sit to  Supine     Supine to sit: Supervision Sit to supine: Supervision   General bed mobility comments: no c/o dizziness, no LOB    Transfers Overall transfer level: Needs assistance Equipment used: Rolling walker (2 wheels) Transfers: Sit to/from Stand Sit to Stand: Min assist           General transfer comment: patient  unsteady when standing to transfer to [facility]      Balance Overall balance assessment: Needs assistance Sitting-balance support: Bilateral upper extremity supported, Feet supported Sitting balance-Leahy Scale: Fair     Standing balance support: Bilateral upper extremity supported, During functional activity, Reliant on assistive device for balance Standing balance-Leahy Scale: Poor                             ADL either performed or assessed with clinical judgement   ADL Overall ADL's : Needs assistance/impaired Eating/Feeding: Independent   Grooming: Wash/dry hands;Wash/dry face;Supervision/safety   Upper Body Bathing: Supervision/ safety   Lower Body Bathing: Minimal assistance   Upper Body Dressing : Supervision/safety   Lower Body Dressing: Minimal assistance   Toilet Transfer: Minimal assistance;Ambulation;Rolling walker (2 wheels);Stand-pivot;BSC/3in1   Toileting- Architect and Hygiene: Contact guard assist;Sit to/from stand       Functional mobility during ADLs: Minimal assistance;Contact guard assist General ADL Comments: min A with LB ADLs due to unsteadiness     Vision Ability to See in Adequate Light: 0 Adequate Patient Visual Report: No change from baseline       Perception         Praxis         Pertinent  Vitals/Pain Pain Assessment Pain Assessment: No/denies pain Pain Score: 0-No pain Pain Intervention(s): Monitored during session, Repositioned     Extremity/Trunk Assessment Upper Extremity Assessment Upper Extremity Assessment: Overall WFL for tasks assessed   Lower Extremity  Assessment Lower Extremity Assessment: Defer to PT evaluation   Cervical / Trunk Assessment Cervical / Trunk Assessment: Normal   Communication Communication Communication: No apparent difficulties   Cognition Arousal: Alert Behavior During Therapy: WFL for tasks assessed/performed Cognition: No apparent impairments                               Following commands: Intact       Cueing  General Comments          Exercises     Shoulder Instructions      Home Living Family/patient expects to be discharged to:: Private residence   Available Help at Discharge: Family;Available 24 hours/day Type of Home: House       Home Layout: Two level Alternate Level Stairs-Number of Steps: flight with landing Alternate Level Stairs-Rails: Right Bathroom Shower/Tub: Chief Strategy Officer: Standard     Home Equipment: None   Additional Comments: pt has autistic  son for whom she  cares for, also 2 grandchildren and another daughter in the home      Prior Functioning/Environment Prior Level of Function : Independent/Modified Independent;Driving                    OT Problem List: Impaired balance (sitting and/or standing);Decreased activity tolerance;Decreased knowledge of use of DME or AE   OT Treatment/Interventions: Self-care/ADL training;Therapeutic activities;DME and/or AE instruction;Balance training;Patient/family education      OT Goals(Current goals can be found in the care plan section)   Acute Rehab OT Goals Patient Stated Goal: go home OT Goal Formulation: With patient Time For Goal Achievement: 12/29/23 Potential to Achieve Goals: Good ADL Goals Pt Will Perform Grooming: Independently;with supervision;standing Pt Will Perform Upper Body Bathing: with set-up;with modified independence Pt Will Perform Lower Body Bathing: with contact guard assist;with supervision Pt Will Perform Upper Body Dressing: with set-up;with  modified independence Pt Will Perform Lower Body Dressing: with contact guard assist;with supervision Pt Will Transfer to Toilet: with contact guard assist;with supervision;ambulating;stand pivot transfer Pt Will Perform Toileting - Clothing Manipulation and hygiene: with supervision;with modified independence;sit to/from stand Pt Will Perform Tub/Shower Transfer: with min assist;with contact guard assist;ambulating;shower seat   OT Frequency:  Min 1X/week    Co-evaluation              AM-PAC OT "6 Clicks" Daily Activity     Outcome Measure Help from another person eating meals?: None Help from another person taking care of personal grooming?: A Little Help from another person toileting, which includes using toliet, bedpan, or urinal?: A Little Help from another person bathing (including washing, rinsing, drying)?: A Little Help from another person to put on and taking off regular upper body clothing?: A Little Help from another person to put on and taking off regular lower body clothing?: A Little 6 Click Score: 19   End of Session Equipment Utilized During Treatment: Gait belt;Rolling walker (2 wheels);Other (comment) (BSC)  Activity Tolerance: Patient tolerated treatment well Patient left: in bed;with call bell/phone within reach;with bed alarm set  OT Visit Diagnosis: Unsteadiness on feet (R26.81);History of falling (Z91.81);Dizziness and giddiness (R42)  Time: 3086-5784 OT Time Calculation (min): 26 min Charges:  OT General Charges $OT Visit: 1 Visit OT Evaluation $OT Eval Low Complexity: 1 Low OT Treatments $Therapeutic Activity: 8-22 mins    Galen Manila 12/15/2023, 3:21 PM

## 2023-12-16 DIAGNOSIS — R8290 Unspecified abnormal findings in urine: Secondary | ICD-10-CM | POA: Diagnosis not present

## 2023-12-16 DIAGNOSIS — F419 Anxiety disorder, unspecified: Secondary | ICD-10-CM | POA: Diagnosis not present

## 2023-12-16 DIAGNOSIS — Z8673 Personal history of transient ischemic attack (TIA), and cerebral infarction without residual deficits: Secondary | ICD-10-CM | POA: Diagnosis not present

## 2023-12-16 DIAGNOSIS — G4710 Hypersomnia, unspecified: Secondary | ICD-10-CM | POA: Diagnosis not present

## 2023-12-16 DIAGNOSIS — R400 Somnolence: Secondary | ICD-10-CM

## 2023-12-16 LAB — CBC
HCT: 40.4 % (ref 36.0–46.0)
Hemoglobin: 13.3 g/dL (ref 12.0–15.0)
MCH: 30 pg (ref 26.0–34.0)
MCHC: 32.9 g/dL (ref 30.0–36.0)
MCV: 91.2 fL (ref 80.0–100.0)
Platelets: 216 K/uL (ref 150–400)
RBC: 4.43 MIL/uL (ref 3.87–5.11)
RDW: 13.9 % (ref 11.5–15.5)
WBC: 11.2 K/uL — ABNORMAL HIGH (ref 4.0–10.5)
nRBC: 0 % (ref 0.0–0.2)

## 2023-12-16 LAB — BASIC METABOLIC PANEL WITH GFR
Anion gap: 8 (ref 5–15)
BUN: 11 mg/dL (ref 6–20)
CO2: 22 mmol/L (ref 22–32)
Calcium: 8.2 mg/dL — ABNORMAL LOW (ref 8.9–10.3)
Chloride: 105 mmol/L (ref 98–111)
Creatinine, Ser: 1.06 mg/dL — ABNORMAL HIGH (ref 0.44–1.00)
GFR, Estimated: >60 mL/min
Glucose, Bld: 83 mg/dL (ref 70–99)
Potassium: 3.3 mmol/L — ABNORMAL LOW (ref 3.5–5.1)
Sodium: 135 mmol/L (ref 135–145)

## 2023-12-16 LAB — PROTIME-INR
INR: 2.2 — ABNORMAL HIGH (ref 0.8–1.2)
Prothrombin Time: 24.4 s — ABNORMAL HIGH (ref 11.4–15.2)

## 2023-12-16 LAB — MAGNESIUM: Magnesium: 1.6 mg/dL — ABNORMAL LOW (ref 1.7–2.4)

## 2023-12-16 MED ORDER — POTASSIUM CHLORIDE CRYS ER 20 MEQ PO TBCR
40.0000 meq | EXTENDED_RELEASE_TABLET | Freq: Once | ORAL | Status: AC
  Administered 2023-12-16: 40 meq via ORAL
  Filled 2023-12-16: qty 2

## 2023-12-16 MED ORDER — WARFARIN - PHARMACIST DOSING INPATIENT: Freq: Every day | Status: DC

## 2023-12-16 MED ORDER — ALPRAZOLAM 0.5 MG PO TABS
0.5000 mg | ORAL_TABLET | Freq: Three times a day (TID) | ORAL | Status: DC | PRN
  Administered 2023-12-16 – 2023-12-17 (×2): 0.5 mg via ORAL
  Filled 2023-12-16 (×2): qty 1

## 2023-12-16 MED ORDER — PREDNISONE 5 MG PO TABS
2.5000 mg | ORAL_TABLET | Freq: Every day | ORAL | Status: DC
  Administered 2023-12-17 – 2023-12-18 (×2): 2.5 mg via ORAL
  Filled 2023-12-16 (×2): qty 1

## 2023-12-16 MED ORDER — MAGNESIUM SULFATE 2 GM/50ML IV SOLN
2.0000 g | Freq: Once | INTRAVENOUS | Status: AC
  Administered 2023-12-16: 2 g via INTRAVENOUS
  Filled 2023-12-16: qty 50

## 2023-12-16 NOTE — Progress Notes (Signed)
PHARMACY - ANTICOAGULATION CONSULT NOTE  Pharmacy Consult for Warfarin Indication: pulmonary embolus  Allergies  Allergen Reactions   Acetaminophen Nausea And Vomiting   Belsomra [Suvorexant] Shortness Of Breath and Rash   Codeine Shortness Of Breath   Iodinated Contrast Media Shortness Of Breath    Patient Measurements: Height: 5\' 8"  (172.7 cm) Weight: 79 kg (174 lb 2.6 oz) IBW/kg (Calculated) : 63.9    Vital Signs: Temp: 98.3 F (36.8 C) (02/12 1317) Temp Source: Oral (02/12 1317) BP: 126/69 (02/12 1317) Pulse Rate: 94 (02/12 1317)  Labs: Recent Labs    12/14/23 0200 12/14/23 0539 12/14/23 0539 12/15/23 0615 12/16/23 0553  HGB  --  14.0   < > 11.5* 13.3  HCT  --  43.8  --  36.2 40.4  PLT  --  226  --  192 216  LABPROT 24.9*  --   --  28.6* 24.4*  INR 2.2*  --   --  2.7* 2.2*  CREATININE 1.18*  --   --  1.20* 1.06*   < > = values in this interval not displayed.    Estimated Creatinine Clearance: 65.4 mL/min (A) (by C-G formula based on SCr of 1.06 mg/dL (H)).   Medical History: Past Medical History:  Diagnosis Date   Anemia    Anxiety    Depression    History of blood transfusion    in [place]   Hyperlipidemia    Hypertension    Lupus    PE (pulmonary embolism)    Pneumonia    x 3 - last time 2017   PTSD (post-traumatic stress disorder)    Stroke (HCC)    many years ago per patient, no deficits   SVD (spontaneous vaginal delivery)    x 3   TIA (transient ischemic attack)    many years ago per patient, no deficits    Medications:  PTA Warfarin 4mg  daily  Assessment: 58 yr female admitted on 2/10 for hypersomnolence.  PMH significant for lupus, RA, HTN, HLD, CKD, CVA, and PE (on warfarin) On admission INR therapeutic (2.2) and home regimen of warfarin 4mg  daily continued per MD  12/16/23 Pharmacy consulted to manage warfarin therapy.  INR today also therapeutic (2.2) and patient has already received dose of Warfarin 4mg  today  Goal of Therapy:   INR 2-3    Plan:  Continue Warfarin 4mg  po daily as previously ordered Continue to follow PT/INR daily and adjust warfarin dose as needed  Maryellen Pile, PharmD 12/16/2023,6:06 PM

## 2023-12-16 NOTE — Plan of Care (Signed)
Problem: Education: Goal: Knowledge of General Education information will improve Description Including pain rating scale, medication(s)/side effects and non-pharmacologic comfort measures Outcome: Progressing   Problem: Health Behavior/Discharge Planning: Goal: Ability to manage health-related needs will improve Outcome: Progressing

## 2023-12-16 NOTE — Plan of Care (Signed)

## 2023-12-16 NOTE — Progress Notes (Signed)
[facility]  PROGRESS NOTE  Lauren Chung ZOX:[id number] DOB: April 28, 1966 DOA: 12/13/2023 PCP: Tally Joe, MD   Brief HPI:   58 year old female with past medical history of lupus, seronegative rheumatoid arthritis, HTN, HLD, anxiety and depression, CKD stage IIIa, PTSD, history of CVA.  [name], Plaquenil, daily prednisone.  Patient has no memory of what occurred prior to admission.  [name].     Assessment/Plan:   Acute metabolic encephalopathy -Resolved -Unclear etiology -Likely from underlying polypharmacy versus UTI -CT head and MRI brain were unremarkable -Will obtain EEG brain to rule out seizure -She is back to baseline  Concern for meningitis - Given patient's reported altered mental status, headache, intermittent fevers for the last 2 months, some initial concern for meningitis.  No LP performed however.  Brudzinski sign negative, patient afebrile right now, no leukocytosis.  Sed rate negative.  Initially receiving IV Rocephin 2 g every 12 hours and IV vancomycin.  However after discussion with patient, stated she has not had fevers recently.  Lowering suspicion of meningitis,  vancomycin was discontinued, decreased dose of Rocephin.   Likely urinary tract infection - UA showing LE and bacteria but otherwise no leukocytosis.  They be contributing etiology to patient's encephalopathy.  Continue IV ceftriaxone to 1 g every 24 hours.   Follow urine culture   Possible syncope and collapse - Patient stated that she was looking at something in the closet and passed out.  Does not remember browning out or feeling dizzy.  Unclear history given patient also stated she cannot remember why she was here.  ECG normal.  TSH normal.  Telemetry reviewed personally, so far no observed arrhythmias or bradycardia.  Continue on telemetry for now.  Monitor for repeat syncopal type episodes. -Check EEG to rule out seizure   Likely  polypharmacy - Patient on Xanax and Ambien at home.  Reported that family members had noted her feeling increasingly tired and hypersomnolent.  Would also explain patient's memory loss and even syncope.  Patient appearing much more alert.  Continue to hold off on Xanax and other sedative type occasions for now.  Continue to monitor closely. -Will restart Xanax at 0.5 mg p.o. 3 times daily as needed to prevent withdrawal from benzodiazepines   Lupus/rheumatoid arthritis with immunocompromise state - Patient reportedly taking rituximab, Plaquenil, leflunomide, low-dose prednisone 2.5 mg daily at baseline.  More susceptible to infection however no leukocytosis, fever, but possible UTI.  Chest x-ray personally reviewed noting no consolidations.  Will hold DMARDs -Will resume prednisone   Hypertension/hyperlipidemia - Continue Crestor   Anxiety/depression/PTSD - Wellbutrin on board.  As mentioned above, benzodiazepines on hold.  Will monitor for any withdrawal type symptoms.   Acute kidney injury on CKD 3 - Likely prerenal etiology, mild dehydration.  Showing improvement after IV fluid hydration.  Monitor urine output recheck kidney function in AM.   History of PE - Continue chronic warfarin 4 mg daily.  INR 2.2 this morning.   Physical debilitation with possible unilateral muscle weakness - Followed closely by PT.  Assessment noted some mild appreciable unilateral weakness.  Unclear chronicity at this point.  Initial CT negative for acute pathology.  Given pt's headache, dizziness, and other nonspecific symptoms, will go ahead and order MRI to evaluate for more insidious etiology.  Currently would benefit from acute skilled nursing.  Hypokalemia -Potassium is 3.3 -Replace potassium and follow BMP in am   Goals of care - PT likely recommending STR.  Will work with patient, case management, on disposition planning.    Medications     buPROPion  300 mg Oral Daily   docusate sodium  100  mg Oral BID   oxymetazoline  1 spray Each Nare BID   rosuvastatin  10 mg Oral Daily   warfarin  4 mg Oral q1600   Warfarin - Physician Dosing Inpatient   Does not apply q1600     Data Reviewed:   CBG:  No results for input(s): "GLUCAP" in the last 168 hours.  SpO2: 98 %    Vitals:   12/15/23 1237 12/15/23 2127 12/16/23 0512 12/16/23 0513  BP: 112/77 136/82 (!) 144/78   Pulse: 96 94 (!) 106   Resp: 16 16 16 18   Temp: 98.4 F (36.9 C) 98 F (36.7 C) 98.3 F (36.8 C)   TempSrc: Oral Oral Oral   SpO2: 100% 99% 98%   Weight:      Height:          Data Reviewed:  Basic Metabolic Panel: Recent Labs  Lab 12/13/23 1522 12/14/23 0200 12/15/23 0615 12/16/23 0553  NA 139 138 139 135  K 4.4 3.8 3.2* 3.3*  CL 105 107 111 105  CO2 24 22 21* 22  GLUCOSE 79 79 100* 83  BUN 14 14 20 11   CREATININE 1.33* 1.18* 1.20* 1.06*  CALCIUM 8.9 8.6* 7.9* 8.2*  MG  --   --  1.7 1.6*    CBC: Recent Labs  Lab 12/13/23 1522 12/14/23 0539 12/15/23 0615 12/16/23 0553  WBC 8.0 9.7 10.7* 11.2*  NEUTROABS 5.4 8.5*  --   --   HGB 15.1* 14.0 11.5* 13.3  HCT 47.0* 43.8 36.2 40.4  MCV 93.8 93.0 91.9 91.2  PLT 209 226 192 216    LFT Recent Labs  Lab 12/13/23 1522 12/15/23 0615  AST 28 22  ALT 23 17  ALKPHOS 94 71  BILITOT 0.8 0.6  PROT 7.3 5.9*  ALBUMIN 3.9 3.1*     Antibiotics: Anti-infectives (From admission, onward)    Start     Dose/Rate Route Frequency Ordered Stop   12/15/23 1000  cefTRIAXone (ROCEPHIN) 1 g in sodium chloride 0.9 % 100 mL IVPB        1 g 200 mL/hr over 30 Minutes Intravenous Every 24 hours 12/14/23 1044     12/14/23 1000  cefTRIAXone (ROCEPHIN) 2 g in sodium chloride 0.9 % 100 mL IVPB  Status:  Discontinued        2 g 200 mL/hr over 30 Minutes Intravenous Every 12 hours 12/13/23 2121 12/14/23 1043   12/14/23 1000  vancomycin (VANCOREADY) IVPB 750 mg/150 mL  Status:  Discontinued        750 mg 150 mL/hr over 60 Minutes Intravenous 2 times daily  12/13/23 2148 12/14/23 1043   12/13/23 2300  cefTRIAXone (ROCEPHIN) 1 g in sodium chloride 0.9 % 100 mL IVPB  Status:  Discontinued        1 g 200 mL/hr over 30 Minutes Intravenous  Once 12/13/23 2151 12/13/23 2259   12/13/23 2200  vancomycin (VANCOREADY) IVPB 1500 mg/300 mL  Status:  Discontinued        1,500 mg 150 mL/hr over 120 Minutes Intravenous  Once 12/13/23 2148 12/14/23 0107   12/13/23 2000  cefTRIAXone (ROCEPHIN) 1 g in sodium chloride 0.9 % 100 mL IVPB        1 g 200 mL/hr over 30 Minutes Intravenous  Once 12/13/23 1952 12/13/23 2221  DVT prophylaxis: Warfarin  Code Status: Full code  Family Communication: No family at bedside   CONSULTS    Subjective   Denies any complaints   Objective    Physical Examination:   General-appears in no acute distress Heart-S1-S2, regular, no murmur auscultated Lungs-clear to auscultation bilaterally, no wheezing or crackles auscultated Abdomen-soft, nontender, no organomegaly Extremities-no edema in the lower extremities Neuro-alert, oriented x3, no focal deficit noted  Status is: Inpatient:             Meredeth Ide   [facility] If 7PM-7AM, please contact night-coverage at [email or web address], Office  [phone]   12/16/2023, 11:49 AM  LOS: 3 days

## 2023-12-16 NOTE — Progress Notes (Signed)
Physical Therapy Treatment Patient Details Name: Lauren Chung MRN: [id number] DOB: 1966-07-17 Today's Date: 12/16/2023   History of Present Illness Pt is 58 yo female presented on 12/13/23 after syncopal episode with hypersomnolence and memory deficit.  Head CT on 2/9 negative. Admitted with acute metabolic encephlopathy and likely UTI.  Some concern for meningitis initially -now felt to be less likely. Orthostatic BP were negative.  Pt with severe headache, blurry vision on 2/11 - MRI negative for acute changes. Pt with hx including but not limited to  lupus, seronegative rheumatoid arthritis, HTN, HLD, anxiety and depression, CKD stage IIIa, PTSD, history of CVA    PT Comments  Pt with significant improvement today.  She reports headache, dizziness, and light sensitivity have resolved.  Does still have some instability with gait and decreased memory but improving.  She was able to state correct date and recalled 2/3 items after 15 mins today.    She ambulated 200' x2 - able to do so with supervision using RW.  Did complete vestibular assessment - negative for BPPV, does appear consistent with vestibular hypofunction.  Provided with habituation HEP.  Educated on safety.  Updated recommendation to HHPT and RW.  May need outpt vestibular PT in future, but current transportation issues (unable to drive with new dizziness).      If plan is discharge home, recommend the following: A little help with walking and/or transfers;A little help with bathing/dressing/bathroom   Can travel by private vehicle     Yes  Equipment Recommendations  Rolling walker (2 wheels)    Recommendations for Other Services       Precautions / Restrictions Precautions Precautions: Fall     Mobility  Bed Mobility Overal bed mobility: Needs Assistance Bed Mobility: Supine to Sit, Sit to Supine     Supine to sit: Modified independent (Device/Increase time) Sit to supine: Modified independent (Device/Increase time)         Transfers Overall transfer level: Needs assistance Equipment used: Rolling walker (2 wheels), None Transfers: Sit to/from Stand Sit to Stand: Supervision           General transfer comment: STS x 5 during session with and without RW    Ambulation/Gait Ambulation/Gait assistance: Contact guard assist, Supervision Gait Distance (Feet): 200 Feet (200'x2) Assistive device: Rolling walker (2 wheels), None Gait Pattern/deviations: Step-through pattern Gait velocity: decreased     General Gait Details: Pt ambulating 200' CGA without RW - hands in mid guard, occasional scissor gait with 2 LOB but recovers on her own, very cautious pattern and speed.  Pt then ambulated 200' with RW and able to  progress to supervision, no LOB, no scissor gait, still cautious speed but reports feels much better   Stairs             Wheelchair Mobility     Tilt Bed    Modified Rankin (Stroke Patients Only)       Balance Overall balance assessment: Needs assistance Sitting-balance support: No upper extremity supported Sitting balance-Leahy Scale: Normal     Standing balance support: No upper extremity supported, Bilateral upper extremity supported Standing balance-Leahy Scale: Fair Standing balance comment: Could ambulate wtihout RW but unsteady; much improved with RW -supervisison level                            Communication    Cognition Arousal: Alert Behavior During Therapy: WFL for tasks assessed/performed   PT - Cognitive impairments:  Memory                       PT - Cognition Comments: Headache no longer present.  Pt talkative, clear, and demonstrates safety awareness.  She is now aware of correct date and day of week.  She recalls events during hospitalization and provided consistent history but still unclear of events that led to admission.  Provided 3 words for pt to recall - recalled 3/3 immediately and 2/3 after 15 mins.        Cueing     Exercises      General Comments  Vestibular Assessment:  History: Pt with resolved headache and able to participate today.  She reports does not recall events leading to admission but was told she fell and potentially hit head.  Does recall being in ED and sitting on EOB then falling forward onto her head.  Today, she denies headache or dizziness.  Reports yesterday during session was having spinning sensations that are no longer present.  When asked about dizziness/falls in past , pt reports having a TIA about a year ago.  States just prior to that and a few times since had dizzy/spinning sensation - these have mostly occurred walking down her steps and when sliding her slippers on at end of step (looking down) or if stands too quickly.  States has had a couple falls due to this.  Denies any dizziness with rolling in bed. Does report recently having the flu.   Objective: EOEM: intact Smooth Pursuit: intact and coordinated Saccades: intact Nystagmus: Negative Gaze induced Nystagmus: Negative Head Shake Test: mild dizziness but negative nystagmus Test of Skew: Negative Gaze Stabilization: Intact Head Thrust: + correction both side (worse to R) [employer] and Horizontal Roll: Negative nystagmus, mild dizziness testing R side  Educated pt on safety - use of RW, supervision on stairs, use of gait belt, not driving at this time. Also provided HEP with gaze stabilization, head thrust with corrective saccade, and VOR cancellation.  Recommended HHPT and potential outpt vestibular in future.        Pertinent Vitals/Pain Pain Assessment Pain Assessment: No/denies pain    Home Living                          Prior Function            PT Goals (current goals can now be found in the care plan section) Progress towards PT goals: Progressing toward goals    Frequency    Min 1X/week      PT Plan      Co-evaluation              AM-PAC PT "6 Clicks" Mobility    Outcome Measure  Help needed turning from your back to your side while in a flat bed without using bedrails?: None Help needed moving from lying on your back to sitting on the side of a flat bed without using bedrails?: None Help needed moving to and from a bed to a chair (including a wheelchair)?: A Little Help needed standing up from a chair using your arms (e.g., wheelchair or bedside chair)?: A Little Help needed to walk in hospital room?: A Little Help needed climbing 3-5 steps with a railing? : A Little 6 Click Score: 20    End of Session Equipment Utilized During Treatment: Gait belt Activity Tolerance: Patient tolerated treatment well Patient left: with call bell/phone  within reach;in bed;with bed alarm set Nurse Communication: Mobility status PT Visit Diagnosis: Unsteadiness on feet (R26.81);Muscle weakness (generalized) (M62.81);Difficulty in walking, not elsewhere classified (R26.2);Dizziness and giddiness (R42)     Time: 1350-1445 PT Time Calculation (min) (ACUTE ONLY): 55 min  Charges:    $Gait Training: 23-37 mins $Therapeutic Activity: 8-22 mins $Neuromuscular Re-education: 8-22 mins PT General Charges $$ ACUTE PT VISIT: 1 Visit                     Anise Salvo, PT Acute Rehab [facility] [phone]    Rayetta Humphrey 12/16/2023, 3:24 PM

## 2023-12-16 NOTE — TOC Progression Note (Signed)
Transition of Care [facility]) - Progression Note    Patient Details  Name: Lauren Chung MRN: [id number] Date of Birth: [birth year]  [facility] CM/SW Contact  Beckie Busing, RN Phone Number:[phone]  12/16/2023, 3:14 PM  Clinical Narrative:    Home health services will be set up with [facility] at discharge. Will need orders.      Barriers to Discharge: No Barriers Identified  Expected Discharge Plan and Services                                               Social Determinants of Health (SDOH) Interventions SDOH Screenings   Food Insecurity: No Food Insecurity (12/14/2023)  Housing: Low Risk  (12/14/2023)  Transportation Needs: No Transportation Needs (12/14/2023)  Utilities: Not At Risk (12/14/2023)  Social Connections: Socially Isolated (12/14/2023)  Tobacco Use: Low Risk  (12/15/2023)    Readmission Risk Interventions     No data to display

## 2023-12-17 ENCOUNTER — Inpatient Hospital Stay (HOSPITAL_COMMUNITY)
Admit: 2023-12-17 | Discharge: 2023-12-17 | Disposition: A | Discharge status: Home / Self Care | Payer: 59 | Attending: Family Medicine

## 2023-12-17 DIAGNOSIS — R569 Unspecified convulsions: Secondary | ICD-10-CM | POA: Diagnosis not present

## 2023-12-17 DIAGNOSIS — R8290 Unspecified abnormal findings in urine: Secondary | ICD-10-CM | POA: Diagnosis not present

## 2023-12-17 DIAGNOSIS — Z8673 Personal history of transient ischemic attack (TIA), and cerebral infarction without residual deficits: Secondary | ICD-10-CM | POA: Diagnosis not present

## 2023-12-17 DIAGNOSIS — G4710 Hypersomnia, unspecified: Secondary | ICD-10-CM | POA: Diagnosis not present

## 2023-12-17 DIAGNOSIS — R4182 Altered mental status, unspecified: Secondary | ICD-10-CM | POA: Diagnosis not present

## 2023-12-17 DIAGNOSIS — F419 Anxiety disorder, unspecified: Secondary | ICD-10-CM | POA: Diagnosis not present

## 2023-12-17 LAB — BASIC METABOLIC PANEL
Anion gap: 8 (ref 5–15)
BUN: 12 mg/dL (ref 6–20)
CO2: 23 mmol/L (ref 22–32)
Calcium: 8.8 mg/dL — ABNORMAL LOW (ref 8.9–10.3)
Chloride: 108 mmol/L (ref 98–111)
Creatinine, Ser: 1.19 mg/dL — ABNORMAL HIGH (ref 0.44–1.00)
GFR, Estimated: 54 mL/min — ABNORMAL LOW (ref >60)
Glucose, Bld: 101 mg/dL — ABNORMAL HIGH (ref 70–99)
Potassium: 3.6 mmol/L (ref 3.5–5.1)
Sodium: 139 mmol/L (ref 135–145)

## 2023-12-17 LAB — MAGNESIUM: Magnesium: 1.8 mg/dL (ref 1.7–2.4)

## 2023-12-17 LAB — PROTIME-INR
INR: 1.9 — ABNORMAL HIGH (ref 0.8–1.2)
Prothrombin Time: 22.4 s — ABNORMAL HIGH (ref 11.4–15.2)

## 2023-12-17 NOTE — Progress Notes (Signed)
[facility]  PROGRESS NOTE  Lauren Chung ZOX:[id number] DOB: 1966/03/12 DOA: 12/13/2023 PCP: Tally Joe, MD   Brief HPI:   58 year old female with past medical history of lupus, seronegative rheumatoid arthritis, HTN, HLD, anxiety and depression, CKD stage IIIa, PTSD, history of CVA.  [name], Plaquenil, daily prednisone.  Patient has no memory of what occurred prior to admission.  [name].     Assessment/Plan:   Acute metabolic encephalopathy -Resolved -Unclear etiology -Likely from underlying polypharmacy versus UTI -CT head and MRI brain were unremarkable -Will obtain EEG brain to rule out seizure -She is back to baseline  Concern for meningitis - Given patient's reported altered mental status, headache, intermittent fevers for the last 2 months, some initial concern for meningitis.  No LP performed however.  Brudzinski sign negative, patient afebrile right now, no leukocytosis.  Sed rate negative.  Initially receiving IV Rocephin 2 g every 12 hours and IV vancomycin.  However after discussion with patient, stated she has not had fevers recently.  Lowering suspicion of meningitis,  vancomycin was discontinued, decreased dose of Rocephin.   Likely urinary tract infection - UA showing LE and bacteria but otherwise no leukocytosis.  They be contributing etiology to patient's encephalopathy.  Continue IV ceftriaxone to 1 g every 24 hours.   Urine culture not obtained   Possible syncope and collapse - Patient stated that she was looking at something in the closet and passed out.  Does not remember browning out or feeling dizzy.  Unclear history given patient also stated she cannot remember why she was here.  ECG normal.  TSH normal.  Telemetry reviewed personally, so far no observed arrhythmias or bradycardia.  Continue on telemetry for now.  Monitor for repeat syncopal type episodes. -Check EEG to rule out seizure   Likely  polypharmacy - Patient on Xanax and Ambien at home.  Reported that family members had noted her feeling increasingly tired and hypersomnolent.  Would also explain patient's memory loss and even syncope.  Patient appearing much more alert.  Continue to hold off on Xanax and other sedative type occasions for now.  Continue to monitor closely. -Will restart Xanax at 0.5 mg p.o. 3 times daily as needed to prevent withdrawal from benzodiazepines   Lupus/rheumatoid arthritis with immunocompromised state - Patient reportedly taking rituximab, Plaquenil, leflunomide, low-dose prednisone 2.5 mg daily at baseline.  More susceptible to infection however no leukocytosis, fever, but possible UTI.  Chest x-ray personally reviewed noting no consolidations.  Will hold DMARDs -Will resume prednisone 2.5 mg daily -Check cortisol level in a.m.   Hypertension/hyperlipidemia - Continue Crestor   Anxiety/depression/PTSD - Wellbutrin    Acute kidney injury on CKD 3 - Likely prerenal etiology, mild dehydration.  Showing improvement after IV fluid hydration.  Monitor urine output recheck kidney function in AM.   History of PE - Continue chronic warfarin 4 mg daily.  INR 2.2 this morning.   Physical debilitation with possible unilateral muscle weakness - Followed closely by PT.  Assessment noted some mild appreciable unilateral weakness.  Unclear chronicity at this point.  Initial CT negative for acute pathology.  Given pt's headache, dizziness, and other nonspecific symptoms, will go ahead and order MRI to evaluate for more insidious etiology.  Currently would benefit from acute skilled nursing.  Hypokalemia -Replete  Hypomagnesemia -Magnesium 1.6, replaced -Will recheck magnesium today   Goals of care - Home health PT    Medications     buPROPion  300 mg Oral Daily   docusate sodium  100 mg Oral BID   predniSONE  2.5 mg Oral Q breakfast   rosuvastatin  10 mg Oral Daily   warfarin  4 mg Oral q1600    Warfarin - Pharmacist Dosing Inpatient   Does not apply q1600     Data Reviewed:   CBG:  No results for input(s): "GLUCAP" in the last 168 hours.  SpO2: 98 %    Vitals:   12/16/23 1317 12/16/23 2111 12/17/23 0000 12/17/23 0508  BP: 126/69 125/79  (!) 124/95  Pulse: 94 (!) 105  (!) 103  Resp: 16 16 20 16   Temp: 98.3 F (36.8 C) 98.9 F (37.2 C)  98.1 F (36.7 C)  TempSrc: Oral Oral  Oral  SpO2: 95% 98%  98%  Weight:      Height:          Data Reviewed:  Basic Metabolic Panel: Recent Labs  Lab 12/13/23 1522 12/14/23 0200 12/15/23 0615 12/16/23 0553 12/17/23 0500  NA 139 138 139 135 139  K 4.4 3.8 3.2* 3.3* 3.6  CL 105 107 111 105 108  CO2 24 22 21* 22 23  GLUCOSE 79 79 100* 83 101*  BUN 14 14 20 11 12   CREATININE 1.33* 1.18* 1.20* 1.06* 1.19*  CALCIUM 8.9 8.6* 7.9* 8.2* 8.8*  MG  --   --  1.7 1.6*  --     CBC: Recent Labs  Lab 12/13/23 1522 12/14/23 0539 12/15/23 0615 12/16/23 0553  WBC 8.0 9.7 10.7* 11.2*  NEUTROABS 5.4 8.5*  --   --   HGB 15.1* 14.0 11.5* 13.3  HCT 47.0* 43.8 36.2 40.4  MCV 93.8 93.0 91.9 91.2  PLT 209 226 192 216    LFT Recent Labs  Lab 12/13/23 1522 12/15/23 0615  AST 28 22  ALT 23 17  ALKPHOS 94 71  BILITOT 0.8 0.6  PROT 7.3 5.9*  ALBUMIN 3.9 3.1*     Antibiotics: Anti-infectives (From admission, onward)    Start     Dose/Rate Route Frequency Ordered Stop   12/15/23 1000  cefTRIAXone (ROCEPHIN) 1 g in sodium chloride 0.9 % 100 mL IVPB        1 g 200 mL/hr over 30 Minutes Intravenous Every 24 hours 12/14/23 1044     12/14/23 1000  cefTRIAXone (ROCEPHIN) 2 g in sodium chloride 0.9 % 100 mL IVPB  Status:  Discontinued        2 g 200 mL/hr over 30 Minutes Intravenous Every 12 hours 12/13/23 2121 12/14/23 1043   12/14/23 1000  vancomycin (VANCOREADY) IVPB 750 mg/150 mL  Status:  Discontinued        750 mg 150 mL/hr over 60 Minutes Intravenous 2 times daily 12/13/23 2148 12/14/23 1043   12/13/23 2300   cefTRIAXone (ROCEPHIN) 1 g in sodium chloride 0.9 % 100 mL IVPB  Status:  Discontinued        1 g 200 mL/hr over 30 Minutes Intravenous  Once 12/13/23 2151 12/13/23 2259   12/13/23 2200  vancomycin (VANCOREADY) IVPB 1500 mg/300 mL  Status:  Discontinued        1,500 mg 150 mL/hr over 120 Minutes Intravenous  Once 12/13/23 2148 12/14/23 0107   12/13/23 2000  cefTRIAXone (ROCEPHIN) 1 g in sodium chloride 0.9 % 100 mL IVPB        1 g 200 mL/hr over 30 Minutes Intravenous  Once 12/13/23 1952 12/13/23 2221  DVT prophylaxis: Warfarin  Code Status: Full code  Family Communication: No family at bedside   CONSULTS    Subjective   Denies shortness of breath.   Objective    Physical Examination:   General-appears in no acute distress Heart-S1-S2, regular, no murmur auscultated Lungs-clear to auscultation bilaterally, no wheezing or crackles auscultated Abdomen-soft, nontender, no organomegaly Extremities-no edema in the lower extremities Neuro-alert, oriented x3, no focal deficit noted  Status is: Inpatient:             Meredeth Ide   [facility] If 7PM-7AM, please contact night-coverage at [email or web address], Office  [phone]   12/17/2023, 11:25 AM  LOS: 4 days

## 2023-12-17 NOTE — Plan of Care (Signed)

## 2023-12-17 NOTE — Progress Notes (Signed)
PHARMACY - ANTICOAGULATION CONSULT NOTE  Pharmacy Consult for Warfarin Indication: pulmonary embolus  Allergies  Allergen Reactions   Acetaminophen Nausea And Vomiting   Belsomra [Suvorexant] Shortness Of Breath and Rash   Codeine Shortness Of Breath   Iodinated Contrast Media Shortness Of Breath    Patient Measurements: Height: 5\' 8"  (172.7 cm) Weight: 79 kg (174 lb 2.6 oz) IBW/kg (Calculated) : 63.9    Vital Signs: Temp: 98.1 F (36.7 C) (02/13 0508) Temp Source: Oral (02/13 0508) BP: 124/95 (02/13 0508) Pulse Rate: 103 (02/13 0508)  Labs: Recent Labs    12/15/23 0615 12/16/23 0553 12/17/23 0500  HGB 11.5* 13.3  --   HCT 36.2 40.4  --   PLT 192 216  --   LABPROT 28.6* 24.4* 22.4*  INR 2.7* 2.2* 1.9*  CREATININE 1.20* 1.06* 1.19*    Estimated Creatinine Clearance: 58.3 mL/min (A) (by C-G formula based on SCr of 1.19 mg/dL (H)).   Medical History: Past Medical History:  Diagnosis Date   Anemia    Anxiety    Depression    History of blood transfusion    in [place]   Hyperlipidemia    Hypertension    Lupus    PE (pulmonary embolism)    Pneumonia    x 3 - last time 2017   PTSD (post-traumatic stress disorder)    Stroke (HCC)    many years ago per patient, no deficits   SVD (spontaneous vaginal delivery)    x 3   TIA (transient ischemic attack)    many years ago per patient, no deficits    Medications:  PTA Warfarin 4mg  daily  Assessment: 58 yr female admitted on 2/10 for hypersomnolence.  PMH significant for lupus, RA, HTN, HLD, CKD, CVA, and PE (on warfarin) On admission INR therapeutic (2.2) and home regimen of warfarin 4mg  daily continued per MD  On 2/12: Pharmacy consulted to manage warfarin therapy.    12/17/2023 INR 1.9 - slightly below goal of 2-3 on home dose of warfarin 4 mg daily CBC WNL on 2/12, none today No bleeding reported since nosebleed on 2/10 PM which stopped w/ Afrin nosespray. Per notes pt reports has nosebleeds every 2 days.   On Day #5 rocephin for ? UTI  Goal of Therapy:  INR 2-3    Plan:  Continue Warfarin 4mg  po daily Continue to follow PT/INR daily and adjust warfarin dose as needed  Herby Abraham, Pharm.D Use secure chat for questions 12/17/2023 9:33 AM

## 2023-12-17 NOTE — Progress Notes (Signed)
Occupational Therapy Treatment Patient Details Name: Lauren Chung MRN: [id number] DOB: 06-Feb-1966 Today's Date: 12/17/2023   History of present illness Pt is 58 yo female presented on 12/13/23 after syncopal episode with hypersomnolence and memory deficit.  Head CT on 2/9 negative. Admitted with acute metabolic encephlopathy and likely UTI.  Some concern for meningitis initially -now felt to be less likely. Orthostatic BP were negative.  Pt with severe headache, blurry vision on 2/11 - MRI negative for acute changes. Pt with hx including but not limited to  lupus, seronegative rheumatoid arthritis, HTN, HLD, anxiety and depression, CKD stage IIIa, PTSD, history of CVA   OT comments  Good progress toward patient focused goals.  Only complaint this session is a little unsteadiness, mild nausea and decreased activity tolerance.  Patient largely needing supervision with ADL from a sit to stand level and in room mobility/toileting.  OT wil continue efforts in the acute setting with no post acute OT anticipated, [facility] PT can screen for any OT needs that may exist.        If plan is discharge home, recommend the following:  A little help with bathing/dressing/bathroom;A little help with walking and/or transfers;Assistance with cooking/housework;Assist for transportation   Equipment Recommendations  Tub/shower bench    Recommendations for Other Services      Precautions / Restrictions Precautions Precautions: Fall Precaution/Restrictions Comments: syncope. balance loss when standing Restrictions Weight Bearing Restrictions Per Provider Order: No       Mobility Bed Mobility Overal bed mobility: Independent                  Transfers Overall transfer level: Needs assistance Equipment used: Rolling walker (2 wheels), None Transfers: Sit to/from Stand Sit to Stand: Supervision                 Balance Overall balance assessment: Needs assistance Sitting-balance support: Feet  supported Sitting balance-Leahy Scale: Normal     Standing balance support: Reliant on assistive device for balance Standing balance-Leahy Scale: Fair                             ADL either performed or assessed with clinical judgement   ADL       Grooming: Wash/dry hands;Wash/dry face;Supervision/safety;Standing               Lower Body Dressing: Supervision/safety;Sit to/from stand   Toilet Transfer: Supervision/safety;Rolling walker (2 wheels);Regular Toilet                  Extremity/Trunk Assessment Upper Extremity Assessment Upper Extremity Assessment: Overall WFL for tasks assessed   Lower Extremity Assessment Lower Extremity Assessment: Defer to PT evaluation   Cervical / Trunk Assessment Cervical / Trunk Assessment: Normal    Vision Patient Visual Report: No change from baseline     Perception Perception Perception: Not tested   Praxis Praxis Praxis: Not tested   Communication Communication Communication: No apparent difficulties   Cognition Arousal: Alert Behavior During Therapy: WFL for tasks assessed/performed Cognition: No apparent impairments                               Following commands: Intact        Cueing      Exercises      Shoulder Instructions       General Comments      Pertinent Vitals/ Pain  Pain Assessment Faces Pain Scale: No hurt Pain Intervention(s): Monitored during session                                                          Frequency  Min 1X/week        Progress Toward Goals  OT Goals(current goals can now be found in the care plan section)  Progress towards OT goals: Progressing toward goals  Acute Rehab OT Goals OT Goal Formulation: With patient Time For Goal Achievement: 12/29/23 Potential to Achieve Goals: Good  Plan      Co-evaluation                 AM-PAC OT "6 Clicks" Daily Activity     Outcome Measure    Help from another person eating meals?: None Help from another person taking care of personal grooming?: A Little Help from another person toileting, which includes using toliet, bedpan, or urinal?: A Little Help from another person bathing (including washing, rinsing, drying)?: A Little Help from another person to put on and taking off regular upper body clothing?: None Help from another person to put on and taking off regular lower body clothing?: A Little 6 Click Score: 20    End of Session Equipment Utilized During Treatment: Gait belt;Rolling walker (2 wheels)  OT Visit Diagnosis: Unsteadiness on feet (R26.81);History of falling (Z91.81)   Activity Tolerance Patient tolerated treatment well   Patient Left in chair;with call bell/phone within reach   Nurse Communication          Time: 1610-9604 OT Time Calculation (min): 21 min  Charges: OT General Charges $OT Visit: 1 Visit OT Treatments $Self Care/Home Management : 8-22 mins  12/17/2023  RP, OTR/L  Acute Rehabilitation Services  Office:  [phone]   Suzanna Obey 12/17/2023, 8:46 AM

## 2023-12-17 NOTE — Progress Notes (Signed)
Physical Therapy Treatment Patient Details Name: Lauren Chung MRN: [id number] DOB: [birth year] Today's Date: 12/17/2023   History of Present Illness Pt is 58 yo female presented on 12/13/23 after syncopal episode with hypersomnolence and memory deficit.  Head CT on 2/9 negative. Admitted with acute metabolic encephlopathy and likely UTI.  Some concern for meningitis initially -now felt to be less likely. Orthostatic BP were negative.  Pt with severe headache, blurry vision on 2/11 - MRI negative for acute changes. Pt with hx including but not limited to  lupus, seronegative rheumatoid arthritis, HTN, HLD, anxiety and depression, CKD stage IIIa, PTSD, history of CVA    PT Comments  Pt states that she has minor HA still present. Agreeable to amb and attempt stairs per home setup. Pt tolerates activity without an exacerbation of HA symptoms, presence of dizziness following stair negotiation. Pt able to amb x529ft and complete stairs 5 with L HR and 5 with R HR. PLB completed with a noted improvement in dizziness and pt returns to room. Educated on diaphragmatic breathing to allow for improved parasympathetic activation and relaxation as pt states she has been trying a variety of techniques to relax.    If plan is discharge home, recommend the following: A little help with walking and/or transfers;A little help with bathing/dressing/bathroom   Can travel by private vehicle     Yes  Equipment Recommendations  Rolling walker (2 wheels)    Recommendations for Other Services       Precautions / Restrictions Precautions Precautions: Fall Precaution/Restrictions Comments: syncope. balance loss when standing Restrictions Weight Bearing Restrictions Per Provider Order: No     Mobility  Bed Mobility Overal bed mobility: Independent Bed Mobility: Supine to Sit, Sit to Supine     Supine to sit: Modified independent (Device/Increase time) Sit to supine: Modified independent (Device/Increase  time)   General bed mobility comments: up at edge of bed    Transfers Overall transfer level: Needs assistance Equipment used: Rolling walker (2 wheels) Transfers: Sit to/from Stand Sit to Stand: Supervision                Ambulation/Gait Ambulation/Gait assistance: Contact guard assist, Supervision Gait Distance (Feet): 500 Feet Assistive device: Rolling walker (2 wheels), None Gait Pattern/deviations: Step-through pattern, Decreased stride length, Trunk flexed Gait velocity: dec         Stairs Stairs: Yes Stairs assistance: Contact guard assist Stair Management: One rail Right, One rail Left (Pt has 6 stairs, landing, and 6 more stairs. HR on L ascending first 6, then R HR ascending on next 6) Number of Stairs: 10 General stair comments: Pt has some dizziness, O2 sats WNL 93%+ throughout. PLB completed and standing recovery decreases dizziness. uses reciprocal and non reciprocal patterns   Wheelchair Mobility     Tilt Bed    Modified Rankin (Stroke Patients Only)       Balance Overall balance assessment: Modified Independent Sitting-balance support: Feet supported Sitting balance-Leahy Scale: Normal Sitting balance - Comments: sits with head forward, leaning forward   Standing balance support: No upper extremity supported Standing balance-Leahy Scale: Poor Standing balance comment: standing directional changes without AD done at [facility] and with increased time                            Communication Communication Communication: No apparent difficulties  Cognition Arousal: Alert Behavior During Therapy: WFL for tasks assessed/performed   PT - Cognitive impairments: Memory  Following commands: Intact      Cueing    Exercises Other Exercises Other Exercises: diaphragmatic breathing recommend 5-10 mins in supine or hooklying with hands on abdomen for cues. complete prn    General Comments         Pertinent Vitals/Pain Pain Assessment Pain Assessment: 0-10 Pain Score: 2  Pain Location: headache Pain Descriptors / Indicators: Tiring, Discomfort Pain Intervention(s): Monitored during session, Utilized relaxation techniques    Home Living                          Prior Function            PT Goals (current goals can now be found in the care plan section) Acute Rehab PT Goals Patient Stated Goal: know what is wrong PT Goal Formulation: With patient Time For Goal Achievement: 12/28/23 Potential to Achieve Goals: Fair Progress towards PT goals: Progressing toward goals    Frequency    Min 1X/week      PT Plan      Co-evaluation              AM-PAC PT "6 Clicks" Mobility   Outcome Measure  Help needed turning from your back to your side while in a flat bed without using bedrails?: None Help needed moving from lying on your back to sitting on the side of a flat bed without using bedrails?: None Help needed moving to and from a bed to a chair (including a wheelchair)?: None Help needed standing up from a chair using your arms (e.g., wheelchair or bedside chair)?: A Little Help needed to walk in hospital room?: A Little Help needed climbing 3-5 steps with a railing? : A Little 6 Click Score: 21    End of Session Equipment Utilized During Treatment: Gait belt Activity Tolerance: Patient tolerated treatment well Patient left: with call bell/phone within reach;in bed Nurse Communication: Mobility status PT Visit Diagnosis: Unsteadiness on feet (R26.81);Muscle weakness (generalized) (M62.81);Difficulty in walking, not elsewhere classified (R26.2);Dizziness and giddiness (R42)     Time: 1610-9604 PT Time Calculation (min) (ACUTE ONLY): 30 min  Charges:    $Gait Training: 23-37 mins PT General Charges $$ ACUTE PT VISIT: 1 Visit                     Madaline Guthrie, PT Acute Rehabilitation Services Office: [phone] 12/17/2023    Evelena Peat 12/17/2023, 1:33 PM

## 2023-12-17 NOTE — Procedures (Signed)
Patient Name: Lauren Chung  MRN: [id number]  Epilepsy Attending: Charlsie Quest  Referring Physician/Provider: Meredeth Ide, MD  Date: 12/17/2023 Duration: 26.11 mins  Patient history: 58yo f with ams. EEG to evaluate for seizure  Level of alertness: Awake, drowsy, sleep, comatose, lethargic ***  AEDs during EEG study: None  Technical aspects: This EEG study was done with scalp electrodes positioned according to the 10-20 International system of electrode placement. Electrical activity was reviewed with band pass filter of 1-70Hz , sensitivity of 7 uV/mm, display speed of 6mm/sec with a 60Hz  notched filter applied as appropriate. EEG data were recorded continuously and digitally stored.  Video monitoring was available and reviewed as appropriate.  Description: The posterior dominant rhythm consists of 9-10 Hz activity of moderate voltage (25-35 uV) seen predominantly in posterior head regions, symmetric and reactive to eye opening and eye closing. Drowsiness was characterized by attenuation of the posterior background rhythm. Sleep was characterized by vertex waves, sleep spindles (12 to 14 Hz), maximal frontocentral region.  There is an excessive amount of 15 to 18 Hz, 2-3 uV beta activity with irregular morphology distributed symmetrically and diffusely.   EEG showed continuous/intermittent generalized polymorphic sharply contoured 3 to 6 Hz theta-delta slowing.  EEG showed generalized periodic discharges with triphasic morphology at  Hz, more prominent when awake/stimulated.  Generalized Spike/Polyspikes/Sharp waves were noted in left/right frontal/temporal/parietal/occipital region.   Seizure was noted arising from left/right frontal/temporal/parietal/occipital region.  During seizure, patient was noted to.  Onset of seizure, total duration  Event button was pressed on at for .  Concomitant EEG before, during and after the event showed normal posterior dominant rhythm, did not show  any EEG changes suggest seizure.  Patient was noted to have episodes of brief sudden eye opening with whole body jerking every few seconds.  Concomitant EEG showed generalized polyspikes consistent with myoclonic seizures.  In between seizures EEG showed generalized background suppression.   Hyperventilation did not show any EEG change.  Physiologic photic driving was not seen during photic stimulation.  Hyperventilation and photic stimulation were not performed.     ABNORMALITY -Sharp wave, generalized, left/right frontal/temporal/parietal/occipital region.  -Spike,generalized, left/right frontal/temporal/parietal/occipital region.  -Polyspikes, generalized, left/right frontal/temporal/parietal/occipital region.  - Lateralized periodic discharges with overriding fast activity ( LPD +) left/right, maximal frontal/temporal/parietal/occipital region - Periodic discharges with triphasic morphology, generalized ( GPDs) - Intermittent slow, generalized - Continuous slow, generalized - Excessive beta, generalized    IMPRESSION: This study is within normal limits.  This study is suggestive of mild/moderate/severe diffuse encephalopathy, nonspecific etiology but likely related to sedation, toxic-metabolic etiology, anoxic/hypoxic brain injury This study is suggestive of cortical dysfunction arising from left/right frontal/temporal/parietal/occipital region, nonspecific etiology, likely secondary to underlying structural abnormality This study showed evidence of potential epileptogenicity arising from This study showed seizures arising from No seizures or epileptiform discharges were seen throughout the recording. However, only wakefulness and drowsiness were recorded. If suspicion for interictal activity remains a concern, a prolonged study including sleep should be considered.  The excessive beta activity seen in the background is most likely due to the effect of benzodiazepine and is a benign EEG  pattern. Patient was noted to have myoclonic seizures every few seconds.  Additionally there was evidence of severe to profound diffuse encephalopathy.  In the setting of cardiac arrest, this EEG pattern is suggestive of anoxic/hypoxic brain injury.  Dr. was notified.  A normal interictal EEG does not exclude nor support the diagnosis of epilepsy.   Lauren Chung Lauren Chung

## 2023-12-17 NOTE — Progress Notes (Signed)
EEG complete - results pending

## 2023-12-18 DIAGNOSIS — F419 Anxiety disorder, unspecified: Secondary | ICD-10-CM | POA: Diagnosis not present

## 2023-12-18 DIAGNOSIS — R400 Somnolence: Secondary | ICD-10-CM | POA: Diagnosis not present

## 2023-12-18 DIAGNOSIS — R8290 Unspecified abnormal findings in urine: Secondary | ICD-10-CM | POA: Diagnosis not present

## 2023-12-18 DIAGNOSIS — G4710 Hypersomnia, unspecified: Secondary | ICD-10-CM | POA: Diagnosis not present

## 2023-12-18 LAB — PROTIME-INR
INR: 2.1 — ABNORMAL HIGH (ref 0.8–1.2)
Prothrombin Time: 23.6 s — ABNORMAL HIGH (ref 11.4–15.2)

## 2023-12-18 LAB — CORTISOL: Cortisol, Plasma: 1.4 ug/dL

## 2023-12-18 MED ORDER — PREDNISONE 5 MG PO TABS: 5.0000 mg | ORAL_TABLET | Freq: Every day | ORAL | 3 refills | Status: DC

## 2023-12-18 MED ORDER — LEVETIRACETAM 500 MG PO TABS
500.0000 mg | ORAL_TABLET | Freq: Two times a day (BID) | ORAL | Status: DC
  Administered 2023-12-18: 500 mg via ORAL
  Filled 2023-12-18: qty 1

## 2023-12-18 MED ORDER — LEVETIRACETAM 500 MG PO TABS: 500.0000 mg | ORAL_TABLET | Freq: Two times a day (BID) | ORAL | 3 refills | Status: DC

## 2023-12-18 MED ORDER — DOCUSATE SODIUM 100 MG PO CAPS: 100.0000 mg | ORAL_CAPSULE | Freq: Two times a day (BID) | ORAL | 0 refills | Status: DC

## 2023-12-18 MED ORDER — PREDNISONE 10 MG PO TABS: ORAL_TABLET | ORAL | 0 refills | Status: DC

## 2023-12-18 MED ORDER — PREDNISONE 50 MG PO TABS
50.0000 mg | ORAL_TABLET | Freq: Once | ORAL | Status: AC
  Administered 2023-12-18: 50 mg via ORAL
  Filled 2023-12-18: qty 1

## 2023-12-18 NOTE — Progress Notes (Signed)
PHARMACY - ANTICOAGULATION CONSULT NOTE  Pharmacy Consult for Warfarin Indication: pulmonary embolus  Allergies  Allergen Reactions   Acetaminophen Nausea And Vomiting   Belsomra [Suvorexant] Shortness Of Breath and Rash   Codeine Shortness Of Breath   Iodinated Contrast Media Shortness Of Breath    Patient Measurements: Height: 5\' 8"  (172.7 cm) Weight: 79 kg (174 lb 2.6 oz) IBW/kg (Calculated) : 63.9  Vital Signs: Temp: 98.3 F (36.8 C) (02/14 0459) Temp Source: Oral (02/14 0459) BP: 136/80 (02/14 0459) Pulse Rate: 86 (02/14 0459)  Labs: Recent Labs    12/16/23 0553 12/17/23 0500 12/18/23 0551  HGB 13.3  --   --   HCT 40.4  --   --   PLT 216  --   --   LABPROT 24.4* 22.4* 23.6*  INR 2.2* 1.9* 2.1*  CREATININE 1.06* 1.19*  --     Estimated Creatinine Clearance: 58.3 mL/min (A) (by C-G formula based on SCr of 1.19 mg/dL (H)).   Medical History: Past Medical History:  Diagnosis Date   Anemia    Anxiety    Depression    History of blood transfusion    in [place]   Hyperlipidemia    Hypertension    Lupus    PE (pulmonary embolism)    Pneumonia    x 3 - last time 2017   PTSD (post-traumatic stress disorder)    Stroke (HCC)    many years ago per patient, no deficits   SVD (spontaneous vaginal delivery)    x 3   TIA (transient ischemic attack)    many years ago per patient, no deficits    Medications:  PTA Warfarin 4 mg daily   Assessment: 58 yr female admitted on 2/10 for hypersomnolence.  PMH significant for lupus, RA, HTN, HLD, CKD, CVA, and PE (on warfarin) On admission INR therapeutic (2.2) and home regimen of warfarin 4mg  daily continued per MD On 2/13: INR 1.9, pt given warfarin 4 mg No bleeding reported since nosebleed on 2/10, per notes pt reports having nosebleeds every 2 days. On Day 2/14: INR of 2.1  Goal of Therapy:  INR 2-3  Monitor platelets by anticoagulation protocol: Yes   Plan:  Continue Warfarin 4mg  po daily Daily  PT/INR Adjust warfarin dose as needed   Alva Garnet, PharmD Candidate  12/18/2023,8:57 AM

## 2023-12-18 NOTE — Plan of Care (Signed)

## 2023-12-18 NOTE — TOC Transition Note (Signed)
Transition of Care [facility]) - Discharge Note   Patient Details  Name: Berda Shelvin MRN: [id number] Date of Birth: Jul 14, 1966  Transition of Care [facility]) CM/SW Contact:  Beckie Busing, RN Phone Number:[phone]  12/18/2023, 2:29 PM   Clinical Narrative:    Patient with discharge orders [facility] has been set up, DME delivered to bedside. No other TOC needs.    Final next level of care: Home w Home Health Services Barriers to Discharge: No Barriers Identified   Patient Goals and CMS Choice            Discharge Placement                       Discharge Plan and Services Additional resources added to the After Visit Summary for                  DME Arranged: Walker rolling DME Agency: [employer] Date DME Agency Contacted: 12/16/23 Time DME Agency Contacted: 1520 Representative spoke with at DME Agency: Vaughan Basta HH Arranged: PT HH Agency: [facility] Date [facility] Agency Contacted: 12/18/23 Time HH Agency Contacted: 1429 Representative spoke with at [facility] Agency: Kandee Keen updated on orders  Social Drivers of Health (SDOH) Interventions SDOH Screenings   Food Insecurity: No Food Insecurity (12/14/2023)  Housing: Low Risk  (12/14/2023)  Transportation Needs: No Transportation Needs (12/14/2023)  Utilities: Not At Risk (12/14/2023)  Social Connections: Socially Isolated (12/14/2023)  Tobacco Use: Low Risk  (12/15/2023)     Readmission Risk Interventions     No data to display

## 2023-12-18 NOTE — Progress Notes (Signed)
Mobility Specialist - Progress Note   12/18/23 1130  Mobility  Activity Ambulated independently in hallway  Level of Assistance Independent  Assistive Device None  Distance Ambulated (ft) 500 ft  Range of Motion/Exercises Active  Activity Response Tolerated well  Mobility Referral Yes  Mobility visit 1 Mobility  Mobility Specialist Start Time (ACUTE ONLY) 1120  Mobility Specialist Stop Time (ACUTE ONLY) 1130  Mobility Specialist Time Calculation (min) (ACUTE ONLY) 10 min   Pt was found in bed and agreeable to ambulate. No complaints with session. At EOS returned to bed with all needs met. Call bell in reach.  Billey Chang Mobility Specialist

## 2023-12-18 NOTE — Discharge Summary (Addendum)
Physician Discharge Summary   Patient: Lauren Chung MRN: [id number] DOB: Nov 21, 1965  Admit date:     12/13/2023  Discharge date: 12/18/23  Discharge Physician: Meredeth Ide   PCP: Tally Joe, MD   Recommendations at discharge:   Follow-up PCP in 1 week Stop taking Wellbutrin, can increase risk of seizures Start taking Keppra 500 mg p.o. twice daily for seizures, follow-up neurology as outpatient Stop taking metoprolol XL  Discharge Diagnoses: Principal Problem:   Hypersomnolence Active Problems:   History of CVA (cerebrovascular accident)   SLE (systemic lupus erythematosus) (HCC)   Anxiety and depression   HTN (hypertension)   Abnormal urinalysis   Seronegative rheumatoid arthritis (HCC)   HLD (hyperlipidemia)   CKD (chronic kidney disease), stage III (HCC)  Resolved Problems:   * No resolved hospital problems. *  Hospital Course: 58 year old female with past medical history of lupus, seronegative rheumatoid arthritis, HTN, HLD, anxiety and depression, CKD stage IIIa, PTSD, history of CVA.  [name], Plaquenil, daily prednisone.  Patient has no memory of what occurred prior to admission.  [name].     Assessment and Plan:  Acute metabolic encephalopathy/?  Seizure -Resolved -Unclear etiology -Likely from underlying polypharmacy versus UTI -CT head and MRI brain were unremarkable -EEG obtained showed abnormality in left hemisphere -Discussed with neurology, started on Keppra and follow-up with neurology as outpatient -She is back to baseline  -Antibiotics discontinued as there was no concern for meningitis   Likely urinary tract infection Empirically started on Rocephin -Urine culture not obtained   Possible syncope and collapse - Patient stated that she was looking at something in the closet and passed out.  Does not remember browning out or feeling dizzy.  Unclear history given patient also stated she  cannot remember why she was here.  ECG normal.  TSH normal.  Telemetry reviewed personally, so far no observed arrhythmias or bradycardia.  Continue on telemetry for now.  Monitor for repeat syncopal type episodes. -Will discontinue Toprol XL   Likely polypharmacy - Patient on Xanax and Ambien at home.  Reported that family members had noted her feeling increasingly tired and hypersomnolent.  Would also explain patient's memory loss and even syncope.  Patient appearing much more alert.  Continue to hold off on Xanax and other sedative type occasions for now.  Continue to monitor closely. Continue Xanax   Lupus/rheumatoid arthritis with immunocompromised state - Patient reportedly taking rituximab, Plaquenil, leflunomide, low-dose prednisone 2.5 mg daily at baseline.  More susceptible to infection however no leukocytosis, fever, but possible UTI.  Chest x-ray personally reviewed noting no consolidations.  Will hold DMARDs -Cortisol level checked this morning was 1.4 -Patient tells me that she had stopped taking prednisone few months ago -Will send her on prednisone taper and then continue taking prednisone 5 mg daily -Follow-up with rheumatologist as outpatient    Hypertension/hyperlipidemia - Continue Crestor   Anxiety/depression/PTSD - Wellbutrin will be stopped as it can increase risk of seizures -Follow-up with PCP to discuss starting SSRI   Acute kidney injury on CKD 3 - Resolved   History of PE - Continue chronic warfarin 4 mg daily.  INR 2.2 this morning.    Hypokalemia -Replete   Hypomagnesemia Replete   Goals of care - Home health PT       Consultants:  Procedures performed: EEG Disposition: Home Diet recommendation:  Discharge Diet Orders (From admission, onward)     Start     Ordered  12/18/23 0000  Diet - low sodium heart healthy        12/18/23 1324           Regular diet DISCHARGE MEDICATION: Allergies as of 12/18/2023       Reactions    Acetaminophen Nausea And Vomiting   Belsomra [suvorexant] Shortness Of Breath, Rash   Codeine Shortness Of Breath   Iodinated Contrast Media Shortness Of Breath        Medication List     STOP taking these medications    buPROPion 300 MG 24 hr tablet Commonly known as: Wellbutrin XL   metoprolol succinate 25 MG 24 hr tablet Commonly known as: TOPROL-XL       TAKE these medications    alendronate 70 MG tablet Commonly known as: FOSAMAX Take 70 mg by mouth once a week.   ALPRAZolam 1 MG tablet Commonly known as: XANAX 1 qam  2 qhs   docusate sodium 100 MG capsule Commonly known as: COLACE Take 1 capsule (100 mg total) by mouth 2 (two) times daily.   hydroxychloroquine 200 MG tablet Commonly known as: PLAQUENIL Take 200 mg by mouth daily.   leflunomide 10 MG tablet Commonly known as: ARAVA Take 10 mg by mouth daily. Patient states that she may take a second depending on the pain   levETIRAcetam 500 MG tablet Commonly known as: KEPPRA Take 1 tablet (500 mg total) by mouth 2 (two) times daily.   predniSONE 5 MG tablet Commonly known as: DELTASONE Take 2.5 mg by mouth daily with breakfast.   rosuvastatin 10 MG tablet Commonly known as: CRESTOR Take 10 mg by mouth in the morning.   warfarin 4 MG tablet Commonly known as: COUMADIN Take 4 mg by mouth daily.   zolpidem 10 MG tablet Commonly known as: AMBIEN Take 1 tablet (10 mg total) by mouth at bedtime as needed for sleep.               Durable Medical Equipment  (From admission, onward)           Start     Ordered   12/16/23 1520  For home use only DME Walker rolling  Once       Question Answer Comment  Walker: With 5 Inch Wheels   Patient needs a walker to treat with the following condition Weakness      12/16/23 1519            Follow-up Information     [facility]   Specialty: Home Health Services Why: Your home health has been set up with [facility]. The  office will call you with start of care information. Contact information: [address] [place] [address] [phone]                Discharge Exam: Ceasar Mons Weights   12/13/23 1348  Weight: 79 kg   General-appears in no acute distress Heart-S1-S2, regular, no murmur auscultated Lungs-clear to auscultation bilaterally, no wheezing or crackles auscultated Abdomen-soft, nontender, no organomegaly Extremities-no edema in the lower extremities Neuro-alert, oriented x3, no focal deficit noted  Condition at discharge: good  The results of significant diagnostics from this hospitalization (including imaging, microbiology, ancillary and laboratory) are listed below for reference.   Imaging Studies: EEG adult Result Date: 12/17/2023 Charlsie Quest, MD     12/18/2023  9:09 AM Patient Name: Lauren Chung MRN: [id number] Epilepsy Attending: Charlsie Quest Referring Physician/Provider: Meredeth Ide, MD Date:  12/17/2023 Duration: 26.11 mins Patient history: 57yo f with ams. EEG to evaluate for seizure Level of alertness: Awake AEDs during EEG study: None Technical aspects: This EEG study was done with scalp electrodes positioned according to the 10-20 International system of electrode placement. Electrical activity was reviewed with band pass filter of 1-70Hz , sensitivity of 7 uV/mm, display speed of 62mm/sec with a 60Hz  notched filter applied as appropriate. EEG data were recorded continuously and digitally stored.  Video monitoring was available and reviewed as appropriate. Description: The posterior dominant rhythm consists of 9-10 Hz activity of moderate voltage (25-35 uV) seen predominantly in posterior head regions, symmetric and reactive to eye opening and eye closing. Drowsiness was characterized by attenuation of the posterior background rhythm. Sleep was characterized by vertex waves, sleep spindles (12 to 14 Hz), maximal frontocentral region. EEG showed intermittent  generalized polymorphic sharply contoured 3 to 6 Hz theta-delta slowing in left hemisphere. Hyperventilation and photic stimulation were not performed.   ABNORMALITY - Intermittent slow, left hemisphere IMPRESSION: This study is suggestive of cortical dysfunction arising from left hemisphere. No seizures seen throughout the recording. Of note, the slowing in left hemisphere appears sharply contoured. If suspicion for interictal activity remains a concern, a prolonged study including sleep should be considered. Charlsie Quest   MR BRAIN W WO CONTRAST Result Date: 12/15/2023 CLINICAL DATA:  Headache, neuro deficit Headache, increasing frequency or severity EXAM: MRI HEAD WITHOUT AND WITH CONTRAST TECHNIQUE: Multiplanar, multiecho pulse sequences of the brain and surrounding structures were obtained without and with intravenous contrast. CONTRAST:  8mL GADAVIST GADOBUTROL 1 MMOL/ML IV SOLN COMPARISON:  MRI head 05/27/2022. FINDINGS: Brain: No acute infarction, hemorrhage, hydrocephalus, extra-axial collection or mass lesion. Moderate T2/FLAIR hyperintensity white matter. No pathologic enhancement. Vascular: Major arterial flow voids are maintained at the skull base. Skull and upper cervical spine: Normal marrow signal. Sinuses/Orbits: Largely clear sinuses.  No acute orbital findings. Other: No mastoid effusions. IMPRESSION: 1. No evidence of acute intracranial abnormality. 2. Moderate T2/FLAIR hyperintensity white matter, which are nonspecific but most commonly secondary to chronic microvascular ischemic change. Chronic demyelination is a differential consideration. Electronically Signed   By: Feliberto Harts M.D.   On: 12/15/2023 23:24   DG Chest Portable 1 View Result Date: 12/13/2023 CLINICAL DATA:  Increasing weakness over the past 2 months. EXAM: PORTABLE CHEST 1 VIEW COMPARISON:  Chest x-ray dated November 12, 2018. FINDINGS: The heart size and mediastinal contours are within normal limits. Both lungs  are clear. The visualized skeletal structures are unremarkable. IMPRESSION: No active disease. Electronically Signed   By: Obie Dredge M.D.   On: 12/13/2023 16:46   CT Head Wo Contrast Result Date: 12/13/2023 CLINICAL DATA:  Altered mental status. EXAM: CT HEAD WITHOUT CONTRAST TECHNIQUE: Contiguous axial images were obtained from the base of the skull through the vertex without intravenous contrast. RADIATION DOSE REDUCTION: This exam was performed according to the departmental dose-optimization program which includes automated exposure control, adjustment of the mA and/or kV according to patient size and/or use of iterative reconstruction technique. COMPARISON:  Head CT dated 05/27/2022. FINDINGS: Brain: The ventricles and sulci appropriate size for patient's age. Mild periventricular and deep white matter chronic microvascular ischemic changes noted. There is no acute intracranial hemorrhage. No mass effect or midline shift no extra-axial fluid collection. A 1 cm probable calcified meningioma along the right parietal convexity. Vascular: No hyperdense vessel or unexpected calcification. Skull: Normal. Negative for fracture or focal lesion. Sinuses/Orbits: Mild mucoperiosteal thickening of paranasal  sinuses. No air-fluid level. The mastoid air cells are clear. Other: None IMPRESSION: 1. No acute intracranial pathology. 2. Mild chronic microvascular ischemic changes. Electronically Signed   By: Elgie Collard M.D.   On: 12/13/2023 14:30    Microbiology: Results for orders placed or performed during the hospital encounter of 12/13/23  Resp panel by RT-PCR (RSV, Flu A&B, Covid) Anterior Nasal Swab     Status: None   Collection Time: 12/13/23  2:41 PM   Specimen: Anterior Nasal Swab  Result Value Ref Range Status   SARS Coronavirus 2 by RT PCR NEGATIVE NEGATIVE Final    Comment: (NOTE) SARS-CoV-2 target nucleic acids are NOT DETECTED.  The SARS-CoV-2 RNA is generally detectable in upper  respiratory specimens during the acute phase of infection. The lowest concentration of SARS-CoV-2 viral copies this assay can detect is 138 copies/mL. A negative result does not preclude SARS-Cov-2 infection and should not be used as the sole basis for treatment or other patient management decisions. A negative result may occur with  improper specimen collection/handling, submission of specimen other than nasopharyngeal swab, presence of viral mutation(s) within the areas targeted by this assay, and inadequate number of viral copies(<138 copies/mL). A negative result must be combined with clinical observations, patient history, and epidemiological information. The expected result is Negative.  Fact Sheet for Patients:  BloggerCourse.com  Fact Sheet for Healthcare Providers:  SeriousBroker.it  This test is no t yet approved or cleared by the [place] FDA and  has been authorized for detection and/or diagnosis of SARS-CoV-2 by FDA under an Emergency Use Authorization (EUA). This EUA will remain  in effect (meaning this test can be used) for the duration of the COVID-19 declaration under Section 564(b)(1) of the Act, 21 U.S.C.section 360bbb-3(b)(1), unless the authorization is terminated  or revoked sooner.       Influenza A by PCR NEGATIVE NEGATIVE Final   Influenza B by PCR NEGATIVE NEGATIVE Final    Comment: (NOTE) The Xpert Xpress SARS-CoV-2/FLU/RSV plus assay is intended as an aid in the diagnosis of influenza from Nasopharyngeal swab specimens and should not be used as a sole basis for treatment. Nasal washings and aspirates are unacceptable for Xpert Xpress SARS-CoV-2/FLU/RSV testing.  Fact Sheet for Patients: BloggerCourse.com  Fact Sheet for Healthcare Providers: SeriousBroker.it  This test is not yet approved or cleared by the [place] FDA and has been  authorized for detection and/or diagnosis of SARS-CoV-2 by FDA under an Emergency Use Authorization (EUA). This EUA will remain in effect (meaning this test can be used) for the duration of the COVID-19 declaration under Section 564(b)(1) of the Act, 21 U.S.C. section 360bbb-3(b)(1), unless the authorization is terminated or revoked.     Resp Syncytial Virus by PCR NEGATIVE NEGATIVE Final    Comment: (NOTE) Fact Sheet for Patients: BloggerCourse.com  Fact Sheet for Healthcare Providers: SeriousBroker.it  This test is not yet approved or cleared by the [place] FDA and has been authorized for detection and/or diagnosis of SARS-CoV-2 by FDA under an Emergency Use Authorization (EUA). This EUA will remain in effect (meaning this test can be used) for the duration of the COVID-19 declaration under Section 564(b)(1) of the Act, 21 U.S.C. section 360bbb-3(b)(1), unless the authorization is terminated or revoked.  Performed at [facility], [address] [place], [place] [address]     Labs: CBC: Recent Labs  Lab 12/13/23 1522 12/14/23 0539 12/15/23 0615 12/16/23 0553  WBC 8.0 9.7 10.7* 11.2*  NEUTROABS  5.4 8.5*  --   --   HGB 15.1* 14.0 11.5* 13.3  HCT 47.0* 43.8 36.2 40.4  MCV 93.8 93.0 91.9 91.2  PLT 209 226 192 216   Basic Metabolic Panel: Recent Labs  Lab 12/13/23 1522 12/14/23 0200 12/15/23 0615 12/16/23 0553 12/17/23 0500 12/17/23 1815  NA 139 138 139 135 139  --   K 4.4 3.8 3.2* 3.3* 3.6  --   CL 105 107 111 105 108  --   CO2 24 22 21* 22 23  --   GLUCOSE 79 79 100* 83 101*  --   BUN 14 14 20 11 12   --   CREATININE 1.33* 1.18* 1.20* 1.06* 1.19*  --   CALCIUM 8.9 8.6* 7.9* 8.2* 8.8*  --   MG  --   --  1.7 1.6*  --  1.8   Liver Function Tests: Recent Labs  Lab 12/13/23 1522 12/15/23 0615  AST 28 22  ALT 23 17  ALKPHOS 94 71  BILITOT 0.8 0.6  PROT 7.3 5.9*  ALBUMIN 3.9 3.1*    CBG: No results for input(s): "GLUCAP" in the last 168 hours.  Discharge time spent: greater than 30 minutes.  Signed: Meredeth Ide, MD [facility] 12/18/2023

## 2023-12-21 ENCOUNTER — Emergency Department (HOSPITAL_COMMUNITY): Payer: 59

## 2023-12-21 ENCOUNTER — Other Ambulatory Visit: Payer: Self-pay

## 2023-12-21 ENCOUNTER — Inpatient Hospital Stay (HOSPITAL_COMMUNITY)
Admission: EM | Admit: 2023-12-21 | Discharge: 2023-12-24 | DRG: 149 | Disposition: A | Discharge status: Home Health | Payer: 59 | Source: Ambulatory Visit | Attending: Internal Medicine | Admitting: Internal Medicine

## 2023-12-21 DIAGNOSIS — M069 Rheumatoid arthritis, unspecified: Secondary | ICD-10-CM | POA: Diagnosis not present

## 2023-12-21 DIAGNOSIS — G9340 Encephalopathy, unspecified: Secondary | ICD-10-CM | POA: Diagnosis not present

## 2023-12-21 DIAGNOSIS — D72829 Elevated white blood cell count, unspecified: Secondary | ICD-10-CM | POA: Diagnosis not present

## 2023-12-21 DIAGNOSIS — R42 Dizziness and giddiness: Principal | ICD-10-CM | POA: Diagnosis present

## 2023-12-21 DIAGNOSIS — E785 Hyperlipidemia, unspecified: Secondary | ICD-10-CM | POA: Diagnosis present

## 2023-12-21 DIAGNOSIS — I129 Hypertensive chronic kidney disease with stage 1 through stage 4 chronic kidney disease, or unspecified chronic kidney disease: Secondary | ICD-10-CM | POA: Diagnosis present

## 2023-12-21 DIAGNOSIS — Z1152 Encounter for screening for COVID-19: Secondary | ICD-10-CM

## 2023-12-21 DIAGNOSIS — Z91041 Radiographic dye allergy status: Secondary | ICD-10-CM | POA: Diagnosis not present

## 2023-12-21 DIAGNOSIS — Z8673 Personal history of transient ischemic attack (TIA), and cerebral infarction without residual deficits: Secondary | ICD-10-CM | POA: Diagnosis not present

## 2023-12-21 DIAGNOSIS — Z806 Family history of leukemia: Secondary | ICD-10-CM

## 2023-12-21 DIAGNOSIS — I10 Essential (primary) hypertension: Secondary | ICD-10-CM | POA: Diagnosis not present

## 2023-12-21 DIAGNOSIS — R2990 Unspecified symptoms and signs involving the nervous system: Secondary | ICD-10-CM | POA: Diagnosis not present

## 2023-12-21 DIAGNOSIS — E876 Hypokalemia: Secondary | ICD-10-CM | POA: Diagnosis present

## 2023-12-21 DIAGNOSIS — T426X5A Adverse effect of other antiepileptic and sedative-hypnotic drugs, initial encounter: Secondary | ICD-10-CM | POA: Diagnosis not present

## 2023-12-21 DIAGNOSIS — Z888 Allergy status to other drugs, medicaments and biological substances status: Secondary | ICD-10-CM

## 2023-12-21 DIAGNOSIS — Z7901 Long term (current) use of anticoagulants: Secondary | ICD-10-CM | POA: Diagnosis not present

## 2023-12-21 DIAGNOSIS — G43909 Migraine, unspecified, not intractable, without status migrainosus: Secondary | ICD-10-CM | POA: Diagnosis not present

## 2023-12-21 DIAGNOSIS — Z811 Family history of alcohol abuse and dependence: Secondary | ICD-10-CM

## 2023-12-21 DIAGNOSIS — M797 Fibromyalgia: Secondary | ICD-10-CM | POA: Diagnosis not present

## 2023-12-21 DIAGNOSIS — R55 Syncope and collapse: Secondary | ICD-10-CM | POA: Diagnosis present

## 2023-12-21 DIAGNOSIS — Z7983 Long term (current) use of bisphosphonates: Secondary | ICD-10-CM

## 2023-12-21 DIAGNOSIS — Z86711 Personal history of pulmonary embolism: Secondary | ICD-10-CM

## 2023-12-21 DIAGNOSIS — I69354 Hemiplegia and hemiparesis following cerebral infarction affecting left non-dominant side: Secondary | ICD-10-CM

## 2023-12-21 DIAGNOSIS — N1831 Chronic kidney disease, stage 3a: Secondary | ICD-10-CM | POA: Diagnosis present

## 2023-12-21 DIAGNOSIS — E8809 Other disorders of plasma-protein metabolism, not elsewhere classified: Secondary | ICD-10-CM | POA: Diagnosis not present

## 2023-12-21 DIAGNOSIS — H5500 Unspecified nystagmus: Secondary | ICD-10-CM | POA: Diagnosis present

## 2023-12-21 DIAGNOSIS — Z79899 Other long term (current) drug therapy: Secondary | ICD-10-CM | POA: Diagnosis not present

## 2023-12-21 DIAGNOSIS — Z813 Family history of other psychoactive substance abuse and dependence: Secondary | ICD-10-CM

## 2023-12-21 DIAGNOSIS — R531 Weakness: Secondary | ICD-10-CM | POA: Diagnosis not present

## 2023-12-21 DIAGNOSIS — R569 Unspecified convulsions: Secondary | ICD-10-CM | POA: Diagnosis not present

## 2023-12-21 DIAGNOSIS — M329 Systemic lupus erythematosus, unspecified: Secondary | ICD-10-CM | POA: Diagnosis not present

## 2023-12-21 DIAGNOSIS — R404 Transient alteration of awareness: Secondary | ICD-10-CM | POA: Diagnosis not present

## 2023-12-21 DIAGNOSIS — Z9181 History of falling: Secondary | ICD-10-CM

## 2023-12-21 DIAGNOSIS — G4700 Insomnia, unspecified: Secondary | ICD-10-CM | POA: Diagnosis not present

## 2023-12-21 DIAGNOSIS — F4310 Post-traumatic stress disorder, unspecified: Secondary | ICD-10-CM | POA: Diagnosis present

## 2023-12-21 DIAGNOSIS — E7800 Pure hypercholesterolemia, unspecified: Secondary | ICD-10-CM | POA: Diagnosis not present

## 2023-12-21 DIAGNOSIS — Z743 Need for continuous supervision: Secondary | ICD-10-CM | POA: Diagnosis not present

## 2023-12-21 HISTORY — DX: Rheumatoid arthritis, unspecified: M06.9

## 2023-12-21 HISTORY — DX: Chronic kidney disease, stage 3 unspecified: N18.30

## 2023-12-21 HISTORY — DX: Disorder of kidney and ureter, unspecified: N28.9

## 2023-12-21 HISTORY — DX: Unspecified convulsions: R56.9

## 2023-12-21 HISTORY — DX: Unspecified osteoarthritis, unspecified site: M19.90

## 2023-12-21 LAB — RESP PANEL BY RT-PCR (RSV, FLU A&B, COVID)  RVPGX2
Influenza A by PCR: NEGATIVE
Influenza B by PCR: NEGATIVE
Resp Syncytial Virus by PCR: NEGATIVE
SARS Coronavirus 2 by RT PCR: NEGATIVE

## 2023-12-21 LAB — COMPREHENSIVE METABOLIC PANEL
ALT: 29 U/L (ref 0–44)
AST: 25 U/L (ref 15–41)
Albumin: 3.8 g/dL (ref 3.5–5.0)
Alkaline Phosphatase: 77 U/L (ref 38–126)
Anion gap: 7 (ref 5–15)
BUN: 18 mg/dL (ref 6–20)
CO2: 26 mmol/L (ref 22–32)
Calcium: 8.8 mg/dL — ABNORMAL LOW (ref 8.9–10.3)
Chloride: 105 mmol/L (ref 98–111)
Creatinine, Ser: 1.09 mg/dL — ABNORMAL HIGH (ref 0.44–1.00)
GFR, Estimated: 60 mL/min — ABNORMAL LOW (ref >60)
Glucose, Bld: 92 mg/dL (ref 70–99)
Potassium: 3.4 mmol/L — ABNORMAL LOW (ref 3.5–5.1)
Sodium: 138 mmol/L (ref 135–145)
Total Bilirubin: 0.3 mg/dL (ref 0.0–1.2)
Total Protein: 7.2 g/dL (ref 6.5–8.1)

## 2023-12-21 LAB — URINALYSIS, ROUTINE W REFLEX MICROSCOPIC
Bacteria, UA: NONE SEEN
Bilirubin Urine: NEGATIVE
Glucose, UA: NEGATIVE mg/dL
Ketones, ur: NEGATIVE mg/dL
Leukocytes,Ua: NEGATIVE
Nitrite: NEGATIVE
Protein, ur: NEGATIVE mg/dL
Specific Gravity, Urine: 1.008 (ref 1.005–1.030)
pH: 7 (ref 5.0–8.0)

## 2023-12-21 LAB — CBC WITH DIFFERENTIAL/PLATELET
Abs Immature Granulocytes: 0.15 10*3/uL — ABNORMAL HIGH (ref 0.00–0.07)
Basophils Absolute: 0 10*3/uL (ref 0.0–0.1)
Basophils Relative: 0 %
Eosinophils Absolute: 0 10*3/uL (ref 0.0–0.5)
Eosinophils Relative: 0 %
HCT: 42.5 % (ref 36.0–46.0)
Hemoglobin: 13.8 g/dL (ref 12.0–15.0)
Immature Granulocytes: 1 %
Lymphocytes Relative: 8 %
Lymphs Abs: 1 10*3/uL (ref 0.7–4.0)
MCH: 30 pg (ref 26.0–34.0)
MCHC: 32.5 g/dL (ref 30.0–36.0)
MCV: 92.4 fL (ref 80.0–100.0)
Monocytes Absolute: 0.5 10*3/uL (ref 0.1–1.0)
Monocytes Relative: 4 %
Neutro Abs: 11.4 10*3/uL — ABNORMAL HIGH (ref 1.7–7.7)
Neutrophils Relative %: 87 %
Platelets: 252 10*3/uL (ref 150–400)
RBC: 4.6 MIL/uL (ref 3.87–5.11)
RDW: 13.8 % (ref 11.5–15.5)
WBC: 13.1 10*3/uL — ABNORMAL HIGH (ref 4.0–10.5)
nRBC: 0 % (ref 0.0–0.2)

## 2023-12-21 LAB — HCG, QUANTITATIVE, PREGNANCY: hCG, Beta Chain, Quant, S: 12 m[IU]/mL — ABNORMAL HIGH (ref <5)

## 2023-12-21 LAB — MAGNESIUM: Magnesium: 2.2 mg/dL (ref 1.7–2.4)

## 2023-12-21 LAB — TROPONIN I (HIGH SENSITIVITY): Troponin I (High Sensitivity): 4 ng/L (ref <18)

## 2023-12-21 MED ORDER — KETOROLAC TROMETHAMINE 15 MG/ML IJ SOLN
15.0000 mg | Freq: Once | INTRAMUSCULAR | Status: AC
  Administered 2023-12-21: 15 mg via INTRAVENOUS
  Filled 2023-12-21: qty 1

## 2023-12-21 MED ORDER — LACTATED RINGERS IV BOLUS
1000.0000 mL | Freq: Once | INTRAVENOUS | Status: AC
  Administered 2023-12-21: 1000 mL via INTRAVENOUS

## 2023-12-21 MED ORDER — DIAZEPAM 5 MG/ML IJ SOLN
2.5000 mg | Freq: Once | INTRAMUSCULAR | Status: AC
  Administered 2023-12-21: 2.5 mg via INTRAVENOUS
  Filled 2023-12-21: qty 2

## 2023-12-21 MED ORDER — ONDANSETRON HCL 4 MG/2ML IJ SOLN
4.0000 mg | Freq: Once | INTRAMUSCULAR | Status: AC
  Administered 2023-12-21: 4 mg via INTRAVENOUS
  Filled 2023-12-21: qty 2

## 2023-12-21 MED ORDER — MECLIZINE HCL 25 MG PO TABS
25.0000 mg | ORAL_TABLET | Freq: Once | ORAL | Status: AC
  Administered 2023-12-21: 25 mg via ORAL
  Filled 2023-12-21: qty 1

## 2023-12-21 NOTE — ED Triage Notes (Addendum)
Pt arrives via EMS from PCP [place] vertigo, photosensitivity, headache, and multiple syncopal episodes at PCP office today.  Recently admitted here for encephalopathy. BG 95 Hx migraines, CVA, TIA

## 2023-12-21 NOTE — ED Provider Notes (Signed)
[place] EMERGENCY DEPARTMENT AT [facility] Provider Note   CSN: [id number] Arrival date & time: 12/21/23  1555     History  Chief Complaint  Patient presents with   Dizziness   Near Syncope    Lauren Chung is a 58 y.o. female with PMH as listed below who presents via EMS from PCP C/O vertigo, photosensitivity, headache, and multiple syncopal episodes at PCP office today. She states she feels very weak, very dizzy, feels like she is going to pass out. Denies CP, SOB, cough, flu-like sxs, fevers/chills, abd pain, N/V/D/C, urinary or vaginal sxs, leg swelling. Endorses palpitations.  Also endorses an 8/10 non-thunderclap headache, located at crown of her head. Didn't hit her head earlier. States this is the same headache she had during her previous admission. Recently admitted from 12/13/23 to 12/18/23 for hypersomnolence, encephalopathy; thought to be due to possibly underlying polypharmacy (on xanax/ambien at home) vs UTI, as CTH/MRI brain were unremarkable. EEG did show abnormality in left hemisphere and neurology recommended starting keppra and following as o/p. BG 95 Hx migraines, CVA, TIA. Takes coumadin.  Past Medical History:  Diagnosis Date   Anemia    Anxiety    Depression    History of blood transfusion    in [place]   Hyperlipidemia    Hypertension    Lupus    PE (pulmonary embolism)    Pneumonia    x 3 - last time 2017   PTSD (post-traumatic stress disorder)    Stroke (HCC)    many years ago per patient, no deficits   SVD (spontaneous vaginal delivery)    x 3   TIA (transient ischemic attack)    many years ago per patient, no deficits       Home Medications Prior to Admission medications   Medication Sig Start Date End Date Taking? Authorizing Provider  alendronate (FOSAMAX) 70 MG tablet Take 70 mg by mouth once a week. 07/15/23   [provider]  ALPRAZolam Prudy Feeler) 1 MG tablet 1 qam  2 qhs Patient taking differently: Take 1 mg by mouth 2  (two) times daily. 08/12/23   Plovsky, Earvin Hansen, MD  docusate sodium (COLACE) 100 MG capsule Take 1 capsule (100 mg total) by mouth 2 (two) times daily. 12/18/23   Meredeth Ide, MD  hydroxychloroquine (PLAQUENIL) 200 MG tablet Take 200 mg by mouth daily. 02/21/22   [provider]  leflunomide (ARAVA) 10 MG tablet Take 10 mg by mouth daily. Patient states that she may take a second depending on the pain    [provider]  levETIRAcetam (KEPPRA) 500 MG tablet Take 1 tablet (500 mg total) by mouth 2 (two) times daily. 12/18/23   Meredeth Ide, MD  predniSONE (DELTASONE) 10 MG tablet Prednisone 40 mg po daily x 2 day then Prednisone 30 mg po daily x 2 day then Prednisone 20 mg po daily x 2 day then Prednisone 10 mg daily x 2 day then start taking prednisone 5 mg daily till you  see your rheumatologist 12/18/23   Meredeth Ide, MD  predniSONE (DELTASONE) 5 MG tablet Take 1 tablet (5 mg total) by mouth daily with breakfast. 12/18/23   Meredeth Ide, MD  rosuvastatin (CRESTOR) 10 MG tablet Take 10 mg by mouth in the morning.    [provider]  warfarin (COUMADIN) 4 MG tablet Take 4 mg by mouth daily. 07/22/23   [provider]  zolpidem (AMBIEN) 10 MG tablet Take 1 tablet (  10 mg total) by mouth at bedtime as needed for sleep. 08/12/23 12/14/23  Archer Asa, MD      Allergies    Acetaminophen, Belsomra [suvorexant], Codeine, and Iodinated contrast media    Review of Systems   Review of Systems A 10 point review of systems was performed and is negative unless otherwise reported in HPI.  Physical Exam Updated Vital Signs BP 126/85   Pulse (!) 57   Temp 97.7 F (36.5 C) (Oral)   Resp 14   LMP  (LMP Unknown)   SpO2 97%  Physical Exam General: Normal appearing female, lying in bed.  HEENT: PERRLA, EOMI, no nystagmus, Sclera anicteric, MMM, trachea midline.  Cardiology: RRR, no murmurs/rubs/gallops.   Resp: Normal respiratory rate and effort. CTAB, no wheezes,  rhonchi, crackles.  Abd: Soft, non-tender, non-distended. No rebound tenderness or guarding.  GU: Deferred. MSK: No peripheral edema or signs of trauma. Extremities without deformity or TTP. No cyanosis or clubbing. Skin: warm, dry.  Back: No CVA tenderness Neuro: A&Ox4, CNs II-XII grossly intact. 5/5 strength all extremities. Sensation grossly intact. Normal speech. Psych: Normal mood and affect.   ED Results / Procedures / Treatments   Labs (all labs ordered are listed, but only abnormal results are displayed) Labs Reviewed  CBC WITH DIFFERENTIAL/PLATELET - Abnormal; Notable for the following components:      Result Value   WBC 13.1 (*)    Neutro Abs 11.4 (*)    Abs Immature Granulocytes 0.15 (*)    All other components within normal limits  COMPREHENSIVE METABOLIC PANEL - Abnormal; Notable for the following components:   Potassium 3.4 (*)    Creatinine, Ser 1.09 (*)    Calcium 8.8 (*)    GFR, Estimated 60 (*)    All other components within normal limits  HCG, QUANTITATIVE, PREGNANCY - Abnormal; Notable for the following components:   hCG, Beta Chain, Quant, S 12 (*)    All other components within normal limits  URINALYSIS, ROUTINE W REFLEX MICROSCOPIC - Abnormal; Notable for the following components:   Color, Urine STRAW (*)    Hgb urine dipstick SMALL (*)    All other components within normal limits  RESP PANEL BY RT-PCR (RSV, FLU A&B, COVID)  [id number]  MAGNESIUM  TROPONIN I (HIGH SENSITIVITY)  TROPONIN I (HIGH SENSITIVITY)    EKG EKG Interpretation Date/Time:  Monday December 21 2023 16:12:20 EST Ventricular Rate:  66 PR Interval:  142 QRS Duration:  96 QT Interval:  396 QTC Calculation: 415 R Axis:   66  Text Interpretation: Sinus rhythm Abnormal R-wave progression, early transition Minimal ST elevation, inferior leads Confirmed by Vivi Barrack [phone]) on 12/21/2023 5:55:57 PM  Radiology DG Chest Portable 1 View Result Date: 12/21/2023 CLINICAL DATA:  Dizziness  and syncope. EXAM: PORTABLE CHEST 1 VIEW COMPARISON:  December 13, 2023 FINDINGS: The heart size and mediastinal contours are within normal limits. Both lungs are clear. The visualized skeletal structures are unremarkable. IMPRESSION: No active disease. Electronically Signed   By: Aram Candela M.D.   On: 12/21/2023 19:47    Procedures Procedures    Medications Ordered in ED Medications  diazepam (VALIUM) injection 2.5 mg (has no administration in time range)  ondansetron (ZOFRAN) injection 4 mg (4 mg Intravenous Given 12/21/23 1750)  ketorolac (TORADOL) 15 MG/ML injection 15 mg (15 mg Intravenous Given 12/21/23 1905)  lactated ringers bolus 1,000 mL (1,000 mLs Intravenous New Bag/Given 12/21/23 1932)  meclizine (ANTIVERT) tablet 25 mg (25 mg Oral Given  12/21/23 2030)    ED Course/ Medical Decision Making/ A&P                          Medical Decision Making Amount and/or Complexity of Data Reviewed Labs: ordered. Decision-making details documented in ED Course. Radiology: ordered.  Risk Prescription drug management. Decision regarding hospitalization.    This patient presents to the ED for concern of dizziness, this involves an extensive number of treatment options, and is a complaint that carries with it a high risk of complications and morbidity.  I considered the following differential and admission for this acute, potentially life threatening condition.   MDM:    DDX for dizziness/syncope includes but is not limited to:  No anemia or electrolyte abnormalities, no hypo or hyperglycemia.  No chest pain or shortness of breath to indicate arrhythmia or ACS and EKG and troponin are reassuring.  Considered orthostatic hypotension or dizziness, as patient symptoms seem to occur mostly when she attempts to stand or walk around.  Patient was so dizzy on standing that she cannot participate with the full orthostatic set of vitals, but her lying to sitting was negative.  Consider to  vertigo as well given the patient does report some component of room spinning, however she has no nystagmus on exam, cannot complete a hints exam.  Did have a recent admission which is a risk factor for PE, however she has no chest pain shortness of breath hypoxia tachypnea or tachycardia.  No evidence of DVT on exam.  During her previous admission, it was suspected that her symptoms may have been due to UTI, her urine today is negative.  Also suspected possible polypharmacy, which could be contributing still. For patient's headache, per chart review she had complained of altered mental status, headache, and blurry vision on 2/11, and CT head and MRI brain were negative for acute findings. She's had no head trauma and states the headache is the same as when she was admitted. Do not believe she needs additional imaging today. Will trial toradol and reeval.   Unlikely SAH: headache is non thunderclap Unlikely Subdural/epidural hematoma: no history of trauma, no anticoagulation. Unlikely Meningitis: afebrile, no meningismus. Unlikely Temporal arteritis: pt < 95 years old.  no tenderness in temporal area Unlikely Acute angle glaucoma: PERRLA, no eye pain. Unlikely Carbon Monoxide Poisoning: no other house members with similar symptoms.   Clinical Course as of 12/21/23 2304  Mon Dec 21, 2023  1756 Troponin I (High Sensitivity): 4 neg [HN]  1756 WBC(!): 13.1 Mild increasing leukocytosis w/ left shift [HN]  1756 Resp panel by RT-PCR (RSV, Flu A&B, Covid) Anterior Nasal Swab neg [HN]  2134 Patient feeling mildly better after fluids/meclizine. Will ambulate and reevaluate. [HN]  2209 Urinalysis, Routine w reflex microscopic -Urine, Clean Catch(!) Neg UA [HN]  2210 Patient was able to ambulate to bathroom but required wheelchair to get back she was so dizzy, despite treatments today including fluids, meclizine, Valium. Will admit to hospitalist. [HN]    Clinical Course User Index [HN] Loetta Rough,  MD    Labs: I Ordered, and personally interpreted labs.  The pertinent results include:  those listed above  Imaging Studies ordered: I ordered imaging studies including CXR I independently visualized and interpreted imaging. I agree with the radiologist interpretation  Additional history obtained from chart review, son at bedside.  Cardiac Monitoring: The patient was maintained on a cardiac monitor.  I personally viewed and interpreted the  cardiac monitored which showed an underlying rhythm of: NSR  Reevaluation: After the interventions noted above, I reevaluated the patient and found that they have :stayed the same  Social Determinants of Health: Lives independently  Disposition: Admitted to hospitalist  Co morbidities that complicate the patient evaluation  Past Medical History:  Diagnosis Date   Anemia    Anxiety    Depression    History of blood transfusion    in [place]   Hyperlipidemia    Hypertension    Lupus    PE (pulmonary embolism)    Pneumonia    x 3 - last time 2017   PTSD (post-traumatic stress disorder)    Stroke (HCC)    many years ago per patient, no deficits   SVD (spontaneous vaginal delivery)    x 3   TIA (transient ischemic attack)    many years ago per patient, no deficits     Medicines Meds ordered this encounter  Medications   ondansetron (ZOFRAN) injection 4 mg   ketorolac (TORADOL) 15 MG/ML injection 15 mg   lactated ringers bolus 1,000 mL   meclizine (ANTIVERT) tablet 25 mg   diazepam (VALIUM) injection 2.5 mg    I have reviewed the patients home medicines and have made adjustments as needed  Problem List / ED Course: Problem List Items Addressed This Visit   None Visit Diagnoses       Dizziness    -  Primary                   This note was created using dictation software, which may contain spelling or grammatical errors.    Loetta Rough, MD 12/21/23 [phone]

## 2023-12-22 ENCOUNTER — Inpatient Hospital Stay (HOSPITAL_COMMUNITY): Payer: 59

## 2023-12-22 ENCOUNTER — Encounter (HOSPITAL_COMMUNITY): Payer: Self-pay | Admitting: Internal Medicine

## 2023-12-22 DIAGNOSIS — E876 Hypokalemia: Secondary | ICD-10-CM | POA: Diagnosis not present

## 2023-12-22 DIAGNOSIS — I129 Hypertensive chronic kidney disease with stage 1 through stage 4 chronic kidney disease, or unspecified chronic kidney disease: Secondary | ICD-10-CM | POA: Diagnosis not present

## 2023-12-22 DIAGNOSIS — R55 Syncope and collapse: Secondary | ICD-10-CM | POA: Diagnosis not present

## 2023-12-22 DIAGNOSIS — R2990 Unspecified symptoms and signs involving the nervous system: Secondary | ICD-10-CM | POA: Diagnosis not present

## 2023-12-22 DIAGNOSIS — Z811 Family history of alcohol abuse and dependence: Secondary | ICD-10-CM | POA: Diagnosis not present

## 2023-12-22 DIAGNOSIS — Z806 Family history of leukemia: Secondary | ICD-10-CM | POA: Diagnosis not present

## 2023-12-22 DIAGNOSIS — Z7983 Long term (current) use of bisphosphonates: Secondary | ICD-10-CM | POA: Diagnosis not present

## 2023-12-22 DIAGNOSIS — T426X5A Adverse effect of other antiepileptic and sedative-hypnotic drugs, initial encounter: Secondary | ICD-10-CM | POA: Diagnosis not present

## 2023-12-22 DIAGNOSIS — Z86711 Personal history of pulmonary embolism: Secondary | ICD-10-CM | POA: Diagnosis not present

## 2023-12-22 DIAGNOSIS — R42 Dizziness and giddiness: Secondary | ICD-10-CM

## 2023-12-22 DIAGNOSIS — G4700 Insomnia, unspecified: Secondary | ICD-10-CM | POA: Diagnosis not present

## 2023-12-22 DIAGNOSIS — E785 Hyperlipidemia, unspecified: Secondary | ICD-10-CM | POA: Diagnosis not present

## 2023-12-22 DIAGNOSIS — Z91041 Radiographic dye allergy status: Secondary | ICD-10-CM | POA: Diagnosis not present

## 2023-12-22 DIAGNOSIS — R29818 Other symptoms and signs involving the nervous system: Secondary | ICD-10-CM | POA: Diagnosis not present

## 2023-12-22 DIAGNOSIS — E8809 Other disorders of plasma-protein metabolism, not elsewhere classified: Secondary | ICD-10-CM | POA: Diagnosis not present

## 2023-12-22 DIAGNOSIS — M069 Rheumatoid arthritis, unspecified: Secondary | ICD-10-CM | POA: Diagnosis not present

## 2023-12-22 DIAGNOSIS — R569 Unspecified convulsions: Secondary | ICD-10-CM | POA: Diagnosis not present

## 2023-12-22 DIAGNOSIS — R270 Ataxia, unspecified: Secondary | ICD-10-CM | POA: Diagnosis not present

## 2023-12-22 DIAGNOSIS — R531 Weakness: Secondary | ICD-10-CM | POA: Diagnosis not present

## 2023-12-22 DIAGNOSIS — Z813 Family history of other psychoactive substance abuse and dependence: Secondary | ICD-10-CM | POA: Diagnosis not present

## 2023-12-22 DIAGNOSIS — Z7901 Long term (current) use of anticoagulants: Secondary | ICD-10-CM | POA: Diagnosis not present

## 2023-12-22 DIAGNOSIS — Z79899 Other long term (current) drug therapy: Secondary | ICD-10-CM | POA: Diagnosis not present

## 2023-12-22 DIAGNOSIS — H5500 Unspecified nystagmus: Secondary | ICD-10-CM | POA: Diagnosis not present

## 2023-12-22 DIAGNOSIS — I69354 Hemiplegia and hemiparesis following cerebral infarction affecting left non-dominant side: Secondary | ICD-10-CM | POA: Diagnosis not present

## 2023-12-22 DIAGNOSIS — Z1152 Encounter for screening for COVID-19: Secondary | ICD-10-CM | POA: Diagnosis not present

## 2023-12-22 DIAGNOSIS — D72829 Elevated white blood cell count, unspecified: Secondary | ICD-10-CM | POA: Diagnosis not present

## 2023-12-22 DIAGNOSIS — N1831 Chronic kidney disease, stage 3a: Secondary | ICD-10-CM | POA: Diagnosis not present

## 2023-12-22 DIAGNOSIS — F4310 Post-traumatic stress disorder, unspecified: Secondary | ICD-10-CM | POA: Diagnosis present

## 2023-12-22 DIAGNOSIS — Z888 Allergy status to other drugs, medicaments and biological substances status: Secondary | ICD-10-CM | POA: Diagnosis not present

## 2023-12-22 LAB — RESPIRATORY PANEL BY PCR

## 2023-12-22 LAB — HEMOGLOBIN A1C
Hgb A1c MFr Bld: 4.8 % (ref 4.8–5.6)
Mean Plasma Glucose: 91.06 mg/dL

## 2023-12-22 LAB — CBC
HCT: 38.4 % (ref 36.0–46.0)
Hemoglobin: 12.5 g/dL (ref 12.0–15.0)
MCH: 30.4 pg (ref 26.0–34.0)
MCHC: 32.6 g/dL (ref 30.0–36.0)
MCV: 93.4 fL (ref 80.0–100.0)
Platelets: 232 10*3/uL (ref 150–400)
RBC: 4.11 MIL/uL (ref 3.87–5.11)
RDW: 13.7 % (ref 11.5–15.5)
WBC: 12.7 10*3/uL — ABNORMAL HIGH (ref 4.0–10.5)
nRBC: 0 % (ref 0.0–0.2)

## 2023-12-22 LAB — BASIC METABOLIC PANEL
Anion gap: 8 (ref 5–15)
BUN: 21 mg/dL — ABNORMAL HIGH (ref 6–20)
CO2: 23 mmol/L (ref 22–32)
Calcium: 8.5 mg/dL — ABNORMAL LOW (ref 8.9–10.3)
Chloride: 106 mmol/L (ref 98–111)
Creatinine, Ser: 0.98 mg/dL (ref 0.44–1.00)
GFR, Estimated: >60 mL/min (ref >60)
Glucose, Bld: 78 mg/dL (ref 70–99)
Potassium: 4 mmol/L (ref 3.5–5.1)
Sodium: 137 mmol/L (ref 135–145)

## 2023-12-22 LAB — LIPID PANEL
Cholesterol: 127 mg/dL (ref 0–200)
HDL: 41 mg/dL (ref >40)
LDL Cholesterol: 68 mg/dL (ref 0–99)
Total CHOL/HDL Ratio: 3.1 {ratio}
Triglycerides: 90 mg/dL (ref <150)
VLDL: 18 mg/dL (ref 0–40)

## 2023-12-22 LAB — PHOSPHORUS: Phosphorus: 3.8 mg/dL (ref 2.5–4.6)

## 2023-12-22 LAB — TSH: TSH: 1.58 u[IU]/mL (ref 0.350–4.500)

## 2023-12-22 LAB — PROTIME-INR
INR: 2.6 — ABNORMAL HIGH (ref 0.8–1.2)
Prothrombin Time: 28 s — ABNORMAL HIGH (ref 11.4–15.2)

## 2023-12-22 LAB — MAGNESIUM: Magnesium: 2 mg/dL (ref 1.7–2.4)

## 2023-12-22 LAB — HIV ANTIBODY (ROUTINE TESTING W REFLEX): HIV Screen 4th Generation wRfx: NONREACTIVE

## 2023-12-22 MED ORDER — MELATONIN 3 MG PO TABS: 6.0000 mg | ORAL_TABLET | Freq: Every evening | ORAL | Status: DC | PRN

## 2023-12-22 MED ORDER — MECLIZINE HCL 25 MG PO TABS: 25.0000 mg | ORAL_TABLET | Freq: Three times a day (TID) | ORAL | Status: DC | PRN

## 2023-12-22 MED ORDER — SODIUM CHLORIDE 0.9% FLUSH
3.0000 mL | Freq: Two times a day (BID) | INTRAVENOUS | Status: DC
  Administered 2023-12-22 – 2023-12-24 (×5): 3 mL via INTRAVENOUS

## 2023-12-22 MED ORDER — LORAZEPAM 0.5 MG PO TABS: 0.5000 mg | ORAL_TABLET | ORAL | Status: DC | PRN

## 2023-12-22 MED ORDER — LORAZEPAM 2 MG/ML IJ SOLN
0.5000 mg | INTRAMUSCULAR | Status: DC | PRN
  Administered 2023-12-22: 0.5 mg via INTRAVENOUS
  Filled 2023-12-22: qty 1

## 2023-12-22 MED ORDER — WARFARIN - PHARMACIST DOSING INPATIENT: Freq: Every day | Status: DC

## 2023-12-22 MED ORDER — LEFLUNOMIDE 10 MG PO TABS: 10.0000 mg | ORAL_TABLET | Freq: Every day | ORAL | Status: DC

## 2023-12-22 MED ORDER — POTASSIUM CHLORIDE CRYS ER 20 MEQ PO TBCR
40.0000 meq | EXTENDED_RELEASE_TABLET | Freq: Once | ORAL | Status: AC
  Administered 2023-12-22: 40 meq via ORAL
  Filled 2023-12-22: qty 2

## 2023-12-22 MED ORDER — LEVETIRACETAM 500 MG PO TABS
500.0000 mg | ORAL_TABLET | Freq: Two times a day (BID) | ORAL | Status: DC
  Administered 2023-12-22 – 2023-12-23 (×3): 500 mg via ORAL
  Filled 2023-12-22 (×3): qty 1

## 2023-12-22 MED ORDER — ROSUVASTATIN CALCIUM 20 MG PO TABS
10.0000 mg | ORAL_TABLET | Freq: Every morning | ORAL | Status: DC
  Administered 2023-12-23 – 2023-12-24 (×2): 10 mg via ORAL
  Filled 2023-12-22 (×2): qty 1

## 2023-12-22 MED ORDER — WARFARIN SODIUM 4 MG PO TABS
4.0000 mg | ORAL_TABLET | Freq: Once | ORAL | Status: AC
  Administered 2023-12-22: 4 mg via ORAL
  Filled 2023-12-22 (×2): qty 1

## 2023-12-22 MED ORDER — STROKE: EARLY STAGES OF RECOVERY BOOK
Freq: Once | Status: AC
  Filled 2023-12-22: qty 1

## 2023-12-22 MED ORDER — PROCHLORPERAZINE EDISYLATE 10 MG/2ML IJ SOLN
5.0000 mg | Freq: Once | INTRAMUSCULAR | Status: AC
  Administered 2023-12-22: 5 mg via INTRAVENOUS
  Filled 2023-12-22: qty 2

## 2023-12-22 MED ORDER — PREDNISONE 5 MG PO TABS
5.0000 mg | ORAL_TABLET | Freq: Every day | ORAL | Status: DC
  Administered 2023-12-22 – 2023-12-24 (×3): 5 mg via ORAL
  Filled 2023-12-22 (×3): qty 1

## 2023-12-22 MED ORDER — HYDROXYCHLOROQUINE SULFATE 200 MG PO TABS
200.0000 mg | ORAL_TABLET | Freq: Every day | ORAL | Status: DC
  Administered 2023-12-22 – 2023-12-24 (×3): 200 mg via ORAL
  Filled 2023-12-22 (×3): qty 1

## 2023-12-22 MED ORDER — ALPRAZOLAM 0.25 MG PO TABS: 1.0000 mg | ORAL_TABLET | Freq: Two times a day (BID) | ORAL | Status: DC | PRN

## 2023-12-22 MED ORDER — GADOBUTROL 1 MMOL/ML IV SOLN
8.0000 mL | Freq: Once | INTRAVENOUS | Status: AC | PRN
  Administered 2023-12-22: 8 mL via INTRAVENOUS

## 2023-12-22 NOTE — Evaluation (Signed)
Occupational Therapy Evaluation Patient Details Name: Lauren Chung MRN: [id number] DOB: 19-Jul-1966 Today's Date: 12/22/2023   History of Present Illness   Pt is 58 yo female presented on 12/22/23 with dizziness and fatigue with ADLs after recent admission 2/9-2/14/25 following syncopal episode. Pt noted epsiodes of significant dizziness and activity intolerance after returning home. Pt f/u wiht PCP who referred pt back to ED. Pt with hx including but not limited to  lupus, seronegative rheumatoid arthritis, HTN, HLD, anxiety and depression, CKD stage IIIa, PTSD, history of CVA.     Clinical Impressions Patient is a 58 year old female who was admitted for above. Patient was living at home with children support prior level. Currently, patient is CGA for ADLS with 1 min standing tolerance with increased weakness and dizziness with increased time on feet. Patient did have drop in BP with sit to stand as noted below. Patient limited in ability to engage in tasks secondary to above at this time. Plan is for patient to transition back home with family support and no OT follow up at time of d/c.    BP in sitting 123/85, standing 102/71, after walking 109/84      If plan is discharge home, recommend the following:   A little help with bathing/dressing/bathroom;A little help with walking and/or transfers;Assistance with cooking/housework;Assist for transportation     Functional Status Assessment   Patient has had a recent decline in their functional status and demonstrates the ability to make significant improvements in function in a reasonable and predictable amount of time.     Equipment Recommendations   Tub/shower bench      Precautions/Restrictions   Precautions Precautions: Fall Precaution/Restrictions Comments: syncope. balance loss when standing, dizziness Restrictions Weight Bearing Restrictions Per Provider Order: No     Mobility Bed Mobility Overal bed mobility:  Independent                               Balance Overall balance assessment: Needs assistance Sitting-balance support: Feet supported Sitting balance-Leahy Scale: Normal Sitting balance - Comments: requires time in sitting to acommodate to posture change   Standing balance support: No upper extremity supported Standing balance-Leahy Scale: Poor Standing balance comment: pt unable to stand beyond 60 sec with 2WW due to dizziness       ADL either performed or assessed with clinical judgement   ADL Overall ADL's : Needs assistance/impaired Eating/Feeding: Independent   Grooming: Supervision/safety;Standing   Upper Body Bathing: Supervision/ safety   Lower Body Bathing: Minimal assistance   Upper Body Dressing : Supervision/safety   Lower Body Dressing: Supervision/safety;Sit to/from stand   Toilet Transfer: Supervision/safety;Rolling walker (2 wheels);Regular Teacher, adult education Details (indicate cue type and reason): mobiity around bed with two rest breaks with increased feeling of weakness with incresed time on feet. able to move/stand for about 1 min Toileting- Clothing Manipulation and Hygiene: Contact guard assist;Sit to/from stand       Functional mobility during ADLs: Minimal assistance;Contact guard assist       Vision Patient Visual Report: No change from baseline              Pertinent Vitals/Pain Pain Assessment Pain Assessment: No/denies pain     Extremity/Trunk Assessment Upper Extremity Assessment Upper Extremity Assessment: RUE deficits/detail RUE Deficits / Details: h/o dislocation with fear of picking up over 90 degrees.   Lower Extremity Assessment Lower Extremity Assessment: Defer to PT evaluation   Cervical /  Trunk Assessment Cervical / Trunk Assessment: Normal   Communication Communication Communication: No apparent difficulties   Cognition Arousal: Alert Behavior During Therapy: WFL for tasks  assessed/performed Cognition: No apparent impairments             OT - Cognition Comments: plesant and cooperative                                    Home Living Family/patient expects to be discharged to:: Private residence Living Arrangements: Children;Other relatives Available Help at Discharge: Family;Available 24 hours/day Type of Home: House Home Access: Level entry     Home Layout: Two level Alternate Level Stairs-Number of Steps: flight with landing Alternate Level Stairs-Rails: Right Bathroom Shower/Tub: Chief Strategy Officer: Standard     Home Equipment: None   Additional Comments: pt has autistic  son for whom she  cares for, also 2 grandchildren and another daughter in the home      Prior Functioning/Environment Prior Level of Function : Independent/Modified Independent;Driving                    OT Problem List: Impaired balance (sitting and/or standing);Decreased activity tolerance;Decreased knowledge of use of DME or AE   OT Treatment/Interventions: Self-care/ADL training;Therapeutic activities;DME and/or AE instruction;Balance training;Patient/family education      OT Goals(Current goals can be found in the care plan section)   Acute Rehab OT Goals Patient Stated Goal: to go home OT Goal Formulation: With patient Time For Goal Achievement: 01/05/24 Potential to Achieve Goals: Fair   OT Frequency:  Min 1X/week       AM-PAC OT "6 Clicks" Daily Activity     Outcome Measure Help from another person eating meals?: None Help from another person taking care of personal grooming?: A Little Help from another person toileting, which includes using toliet, bedpan, or urinal?: A Little Help from another person bathing (including washing, rinsing, drying)?: A Little Help from another person to put on and taking off regular upper body clothing?: None Help from another person to put on and taking off regular lower body  clothing?: A Little 6 Click Score: 20   End of Session Equipment Utilized During Treatment: Gait belt;Rolling walker (2 wheels)  Activity Tolerance: Patient tolerated treatment well Patient left: in chair;with call bell/phone within reach (in ED)  OT Visit Diagnosis: Unsteadiness on feet (R26.81);History of falling (Z91.81)                Time: 1610-9604 OT Time Calculation (min): 17 min Charges:  OT General Charges $OT Visit: 1 Visit OT Evaluation $OT Eval Low Complexity: 1 Low  Nissan Frazzini OTR/L, MS Acute Rehabilitation Department Office# [phone]   Selinda Flavin 12/22/2023, 10:44 AM

## 2023-12-22 NOTE — Consult Note (Signed)
NEUROLOGY CONSULT NOTE   Date of service: December 22, 2023 Patient Name: Lauren Chung MRN:  [id number] DOB:  Mar 31, 1966 Chief Complaint: "dizziness" Requesting Provider: Jonah Blue, MD  History of Present Illness  Lauren Chung is a 58 y.o. female with hx of prior CVA with L sided weakness, ,  Hx PE on warfarin, SLE, RA, HTN, HLD, CKD IIIa, Mood d/o, recent admission 2/9 - 2/14 for possible syncopal episode v mimic with L sided EEG abnormality and started on Keppra, who was sent to the ED by her PCP due to vertigo. She reports feeling well on 2/14, but on waking on 2/15 noted that she was unable to sit or stand due to feeling of "dizziness".  She went to her primary care doctor on 2/17and nearly fell over.  They called EMS and she has brought to [facility] ED. she did have some improvement in her symptoms however at this time she is feeling very dizzy worse when she looks to the left and worse while standing.  While standing she feels like she is going to fall forward.  She has never had symptoms like this before her hospital admission 2/9.  Meclizine does not help her dizziness much, but she states it does make her feel calmer.  She started taking Keppra 2-3 days ago due to focality on EEG.  She is compliant with her Coumadin.   ROS  Comprehensive ROS performed and pertinent positives documented in HPI   Past History   Past Medical History:  Diagnosis Date   Anemia    Anxiety    Arthritis    CKD (chronic kidney disease) stage 3, GFR 30-59 ml/min (HCC)    Depression    History of blood transfusion    in [place]   Hyperlipidemia    Hypertension    Lupus    PE (pulmonary embolism)    Pneumonia    x 3 - last time 2017   PTSD (post-traumatic stress disorder)    Renal disorder    Rheumatoid arthritis (HCC)    Seizures (HCC)    Stroke (HCC)    many years ago per patient, no deficits   SVD (spontaneous vaginal delivery)    x 3   TIA (transient ischemic attack)    many years ago per  patient, no deficits    Past Surgical History:  Procedure Laterality Date   ABDOMINAL SURGERY     due internal bleeding   ABLATION     gyn procedure for bleeding   CYST EXCISION     top of head   EXCISION MASS HEAD N/A 08/05/2019   Procedure: EXCISION OF SCALP CYST;  Surgeon: Serena Colonel, MD;  Location: [facility];  Service: ENT;  Laterality: N/A;    Family History: Family History  Problem Relation Age of Onset   Drug abuse Mother    Alcohol abuse Mother    Leukemia Mother    Leukemia Father    Seizures Daughter     Social History  reports that she has never smoked. She has never used smokeless tobacco. She reports that she does not drink alcohol and does not use drugs.  Allergies  Allergen Reactions   Acetaminophen Nausea And Vomiting   Belsomra [Suvorexant] Shortness Of Breath and Rash   Codeine Shortness Of Breath   Iodinated Contrast Media Shortness Of Breath    Medications   Current Facility-Administered Medications:    [START ON 12/23/2023]  stroke: early stages of recovery book, , Does not  apply, Once, Dolly Rias, MD   ALPRAZolam Prudy Feeler) tablet 1 mg, 1 mg, Oral, BID PRN, Dolly Rias, MD   hydroxychloroquine (PLAQUENIL) tablet 200 mg, 200 mg, Oral, Daily, Segars, Christiane Ha, MD   levETIRAcetam (KEPPRA) tablet 500 mg, 500 mg, Oral, BID, Segars, Christiane Ha, MD, 500 mg at 12/22/23 1328   LORazepam (ATIVAN) tablet 0.5 mg, 0.5 mg, Oral, PRN **OR** LORazepam (ATIVAN) injection 0.5 mg, 0.5 mg, Intravenous, PRN, Dolly Rias, MD, 0.5 mg at 12/22/23 0600   meclizine (ANTIVERT) tablet 25 mg, 25 mg, Oral, TID PRN, Dolly Rias, MD   melatonin tablet 6 mg, 6 mg, Oral, QHS PRN, Dolly Rias, MD   predniSONE (DELTASONE) tablet 5 mg, 5 mg, Oral, Q breakfast, Segars, Christiane Ha, MD, 5 mg at 12/22/23 1328   rosuvastatin (CRESTOR) tablet 10 mg, 10 mg, Oral, q AM, Segars, Christiane Ha, MD   sodium chloride flush (NS) 0.9 % injection 3 mL, 3 mL,  Intravenous, Q12H, Segars, Christiane Ha, MD, 3 mL at 12/22/23 1328   warfarin (COUMADIN) tablet 4 mg, 4 mg, Oral, ONCE-1600, Maurice March, RPH   Warfarin - Pharmacist Dosing Inpatient, , Does not apply, q1600, Maurice March, RPH  Vitals   Vitals:   12/22/23 1135 12/22/23 1300 12/22/23 1626 12/22/23 1634  BP: 100/70 101/68 108/80   Pulse: 91 72 79   Resp: 17 18 18    Temp: 97.6 F (36.4 C)  (!) 97.5 F (36.4 C)   TempSrc: Oral  Oral   SpO2: 99% 99% 100%   Weight:    79.8 kg  Height:    5\' 8"  (1.727 m)    Body mass index is 26.76 kg/m.  Physical Exam   Constitutional: Appears well-developed and well-nourished.  Psych: Affect appropriate to situation.  Eyes: No scleral injection.  HENT: No OP obstruction.  Head: Normocephalic.  Cardiovascular: Normal rate and regular rhythm while sitting, tachycardic while standing.  Respiratory: Effort normal, non-labored breathing.  GI: Soft.  No distension. There is no tenderness.  Skin: WDI.   Neurologic Examination    Neuro: Mental Status: Patient is awake, alert, oriented to person, place, month, year, and situation. Patient is able to give a clear and coherent history.No signs of aphasia or neglect Cranial Nerves: II: Visual Fields are full. Pupils are equal, round, and reactive to light.   III,IV, VI: Mild left gaze nystagmus V: Facial sensation is symmetric to temperature VII: Facial movement is symmetric resting and smiling VIII: Hearing is intact to voice X: Palate elevates symmetrically XI: Shoulder shrug is symmetric. XII: Tongue protrudes midline without atrophy or fasciculations.  Motor: Tone is normal. Bulk is normal. Elevates all extremities antigravity without drift, subjective leg weakness Sensory: Sensation is symmetric to light touch and temperature in the arms and legs. No extinction to DSS present.  Cerebellar: FNF slowed  Labs/Imaging/Neurodiagnostic studies   CBC:  Recent Labs  Lab 12/28/2023 1706  12/22/23 0317  WBC 13.1* 12.7*  NEUTROABS 11.4*  --   HGB 13.8 12.5  HCT 42.5 38.4  MCV 92.4 93.4  PLT 252 232   Basic Metabolic Panel:  Lab Results  Component Value Date   NA 137 12/22/2023   K 4.0 12/22/2023   CO2 23 12/22/2023   GLUCOSE 78 12/22/2023   BUN 21 (H) 12/22/2023   CREATININE 0.98 12/22/2023   CALCIUM 8.5 (L) 12/22/2023   GFRNONAA >60 12/22/2023   GFRAA 46 (L) 06/13/2020   Lipid Panel:  Lab Results  Component Value Date   LDLCALC 68 12/22/2023  HgbA1c:  Lab Results  Component Value Date   HGBA1C 4.8 12/22/2023   Urine Drug Screen:     Component Value Date/Time   LABOPIA NONE DETECTED 12/13/2023 1701   COCAINSCRNUR NONE DETECTED 12/13/2023 1701   LABBENZ POSITIVE (A) 12/13/2023 1701   AMPHETMU NONE DETECTED 12/13/2023 1701   THCU NONE DETECTED 12/13/2023 1701   LABBARB NONE DETECTED 12/13/2023 1701    Alcohol Level     Component Value Date/Time   ETH <10 03/05/2022 1554   INR  Lab Results  Component Value Date   INR 2.6 (H) 12/22/2023   APTT  Lab Results  Component Value Date   APTT 26 03/05/2022   Orthostatic VS for the past 24 hrs:  BP- Lying Pulse- Lying BP- Sitting Pulse- Sitting BP- Standing at 0 minutes Pulse- Standing at 0 minutes  12/22/23 0745 144/79 70 125/79 66 112/83 92  12/21/23 1807 119/78 70 122/86 72 -- --   MRI Brain(Personally reviewed): 1. No acute intracranial abnormality. 2. Stable age advanced, nonspecific chronic cerebral white matter signal abnormality.  EEG last admission-intermittent left hemispheric slowing  ASSESSMENT   Lauren Chung is a 58 y.o. female presenting with dizziness and vertigo.  She has a mild left gaze nystagmus.  On assessment while standing she feels increased dizziness feeling like she is going to fall forward and her heart rate is 107.  Prior to standing her heart rate was in the 70s.  Of note she just started taking Keppra this week.  She is compliant with her Coumadin.  Recommend  vestibular PT evaluation and we will repeat the EEG.  We may consider switching her Keppra to another AED such as Vimpat depending on the results of the EEG and other workup.  Differential diagnosis include peripheral vertigo/BPPV  vs postural hypotension vs arrhythmia  Less likely MRI negative brainstem stroke given positional symptoms.  RECOMMENDATIONS  - Continue coumadin with INR goal 2-3 -Continue tele to look for arrhythmia - if negative inpatient, consider outpatient cardiac monitor - Vestibular PT eval -rEEG--if remains concerning, will consider cEEG - Keppra can sometimes cause dizziness but given she is coumadin, unless no clear source of her dizziness is found, can consider switching to another AED which does not interfere with coumadin. -Repeat orthostatic vitals tomorrow - Follow up with outpatient neurology - established with Dr. Everlena Cooper and Marlowe Kays at [facility] after discharge ______________________________________________________________________    Signed, Elmer Picker, NP [facility]  Attending Neurohospitalist Addendum Patient seen and examined with APP/Resident. Agree with the history and physical as documented above. Agree with the plan as documented, which I helped formulate. I have independently reviewed the chart, obtained history, review of systems and examined the patient.I have personally reviewed pertinent head/neck/spine imaging (CT/MRI). Please feel free to call with any questions.  -- Milon Dikes, MD Neurologist [facility] Pager: [phone]

## 2023-12-22 NOTE — Progress Notes (Signed)
PHARMACY - ANTICOAGULATION CONSULT NOTE  Pharmacy Consult for warfarin Indication: pulmonary embolus  Allergies  Allergen Reactions   Acetaminophen Nausea And Vomiting   Belsomra [Suvorexant] Shortness Of Breath and Rash   Codeine Shortness Of Breath   Iodinated Contrast Media Shortness Of Breath    Patient Measurements:     Vital Signs: Temp: 97.5 F (36.4 C) (02/18 0148) Temp Source: Oral (02/18 0148) BP: 122/78 (02/18 0100) Pulse Rate: 57 (02/18 0100)  Labs: Recent Labs    12/21/23 1706 12/22/23 0317  HGB 13.8 12.5  HCT 42.5 38.4  PLT 252 232  LABPROT  --  28.0*  INR  --  2.6*  CREATININE 1.09*  --   TROPONINIHS 4  --     Estimated Creatinine Clearance: 63.6 mL/min (A) (by C-G formula based on SCr of 1.09 mg/dL (H)).   Medical History: Past Medical History:  Diagnosis Date   Anemia    Anxiety    Depression    History of blood transfusion    in [place]   Hyperlipidemia    Hypertension    Lupus    PE (pulmonary embolism)    Pneumonia    x 3 - last time 2017   PTSD (post-traumatic stress disorder)    Stroke (HCC)    many years ago per patient, no deficits   SVD (spontaneous vaginal delivery)    x 3   TIA (transient ischemic attack)    many years ago per patient, no deficits    Assessment:  58 y.o. female with hx of prior CVA with L sided weakness, Hx PE on warfarin, SLE, RA, HTN, HLD, CKD IIIa, Mood d/o.  Pharmacy to dose warfarin.  LD unknown  Home dose warfarin 4mg  daily  INR 2.6, CBC WNL   Goal of Therapy:  INR 2-3 Monitor platelets by anticoagulation protocol: Yes   Plan:  Warfarin 4mg  po once today at 1600 Daily INR   Arley Phenix RPh 12/22/2023, 4:10 AM

## 2023-12-22 NOTE — ED Notes (Signed)
[facility] called for pt. Transport.

## 2023-12-22 NOTE — ED Notes (Signed)
Pt passes swallow screen.

## 2023-12-22 NOTE — Plan of Care (Signed)
  Problem: Coping: Goal: Will verbalize positive feelings about self Outcome: Progressing   Problem: Self-Care: Goal: Ability to participate in self-care as condition permits will improve Outcome: Progressing Goal: Verbalization of feelings and concerns over difficulty with self-care will improve Outcome: Progressing Goal: Ability to communicate needs accurately will improve Outcome: Progressing   Problem: Nutrition: Goal: Risk of aspiration will decrease Outcome: Progressing

## 2023-12-22 NOTE — Progress Notes (Signed)
No charge note  Patient seen and examined this morning, just admitted overnight, H&P reviewed and agree with the assessment and plan.  In brief, pleasant 58 year old female with history of CVA with left-sided weakness, PE on Coumadin, recent admission with syncope and confusion, started on Keppra due to left-sided EEG abnormalities, comes back to the hospital with dizziness, especially in upright position and has difficulties walking.  Neurology consulted on admission and requesting transfer to [facility].  On my evaluation she feels better, received Antivert and dizziness is better, however she is in bed and has not attempted any walking.  MRI of the brain done and negative for acute findings, she remains weak on the left side.  It does not look like she was evaluated by neurology in person last time she was hospitalized, I agree with transfer to [facility] for formal consultation and evaluation  Keevin Panebianco M. Elvera Lennox, MD, PhD [facility]  Between 7 am - 7 pm you can contact me via Amion (for emergencies) or Securechat (non urgent matters).  I am not available 7 pm - 7 am, please contact night coverage MD/APP via Amion

## 2023-12-22 NOTE — H&P (Signed)
History and Physical    Lauren Chung OZH:[id number] DOB: 1966-03-14 DOA: 12/21/2023  PCP: Tally Joe, MD   Patient coming from: Home   Chief Complaint:  Chief Complaint  Patient presents with   Dizziness   Near Syncope    HPI:  Lauren Chung is a 58 y.o. female with hx of prior CVA with L sided weakness, Hx PE on warfarin, SLE, RA, HTN, HLD, CKD IIIa, Mood d/o, recent admission 2/9 - 14 for possible syncopal episode v mimic with L sided EEG abnormality and started on Keppra, who was referred by her PCP due to vertigo. She reports feeling well on the 14th, but on waking on 2/15 noted that she was unable to sit or stand due to feeling of "dizziness". Difficult for her to differentiate between lightheadedness or vertigo type symptoms. However, no additional syncopal episodes. Has to "keep her head down" when walking or else feels like she is going to fall. She has a headache over past few days. No other falls or head injury (apart from fall prior to recent hospitalization). Denies subjective weakness, numbness (although noted on exam). No speech or vision changes. She does describe periods of amnesia and family members telling her she is saying "weird things". Apparently called her mother and was asking where the food was (mother lives in [place]) and she has no recollection of this. Due to persistence of symptoms saw her PCP who referred her back to the ED.   When sitting upright during interview had dizziness and headache. Unable to stand without falling.    Review of Systems:  ROS complete and negative except as marked above   Allergies  Allergen Reactions   Acetaminophen Nausea And Vomiting   Belsomra [Suvorexant] Shortness Of Breath and Rash   Codeine Shortness Of Breath   Iodinated Contrast Media Shortness Of Breath    Prior to Admission medications   Medication Sig Start Date End Date Taking? Authorizing Provider  alendronate (FOSAMAX) 70 MG tablet Take 70 mg by mouth once a  week. 07/15/23   [provider]  ALPRAZolam Prudy Feeler) 1 MG tablet 1 qam  2 qhs Patient taking differently: Take 1 mg by mouth 2 (two) times daily. 08/12/23   Plovsky, Earvin Hansen, MD  docusate sodium (COLACE) 100 MG capsule Take 1 capsule (100 mg total) by mouth 2 (two) times daily. 12/18/23   Meredeth Ide, MD  hydroxychloroquine (PLAQUENIL) 200 MG tablet Take 200 mg by mouth daily. 02/21/22   [provider]  leflunomide (ARAVA) 10 MG tablet Take 10 mg by mouth daily. Patient states that she may take a second depending on the pain    [provider]  levETIRAcetam (KEPPRA) 500 MG tablet Take 1 tablet (500 mg total) by mouth 2 (two) times daily. 12/18/23   Meredeth Ide, MD  predniSONE (DELTASONE) 10 MG tablet Prednisone 40 mg po daily x 2 day then Prednisone 30 mg po daily x 2 day then Prednisone 20 mg po daily x 2 day then Prednisone 10 mg daily x 2 day then start taking prednisone 5 mg daily till you  see your rheumatologist 12/18/23   Meredeth Ide, MD  predniSONE (DELTASONE) 5 MG tablet Take 1 tablet (5 mg total) by mouth daily with breakfast. 12/18/23   Meredeth Ide, MD  rosuvastatin (CRESTOR) 10 MG tablet Take 10 mg by mouth in the morning.    [provider]  warfarin (COUMADIN) 4 MG tablet Take 4 mg by mouth daily. 07/22/23  [provider]  zolpidem (AMBIEN) 10 MG tablet Take 1 tablet (10 mg total) by mouth at bedtime as needed for sleep. 08/12/23 12/14/23  Archer Asa, MD    Past Medical History:  Diagnosis Date   Anemia    Anxiety    Depression    History of blood transfusion    in [place]   Hyperlipidemia    Hypertension    Lupus    PE (pulmonary embolism)    Pneumonia    x 3 - last time 2017   PTSD (post-traumatic stress disorder)    Stroke (HCC)    many years ago per patient, no deficits   SVD (spontaneous vaginal delivery)    x 3   TIA (transient ischemic attack)    many years ago per patient, no deficits    Past Surgical History:   Procedure Laterality Date   ABDOMINAL SURGERY     due internal bleeding   ABLATION     gyn procedure for bleeding   CYST EXCISION     top of head   EXCISION MASS HEAD N/A 08/05/2019   Procedure: EXCISION OF SCALP CYST;  Surgeon: Serena Colonel, MD;  Location: [facility];  Service: ENT;  Laterality: N/A;     reports that she has never smoked. She has never used smokeless tobacco. She reports that she does not drink alcohol and does not use drugs.  Family History  Problem Relation Age of Onset   Drug abuse Mother    Alcohol abuse Mother    Leukemia Mother    Leukemia Father    Seizures Daughter      Physical Exam: Vitals:   12/21/23 2200 12/21/23 2321 12/22/23 0100 12/22/23 0148  BP: 126/85 126/83 122/78   Pulse: (!) 57 99 (!) 57   Resp: 14 16 18    Temp: 97.7 F (36.5 C)   (!) 97.5 F (36.4 C)  TempSrc: Oral   Oral  SpO2: 97% 100% 96%     Gen: Awake, alert, NAD   CV: Regular, normal S1, S2, no murmurs  Resp: Normal WOB, CTAB  Abd: Flat, normoactive, nontender MSK: Symmetric, no edema  Skin: No rashes or lesions to exposed skin  Neuro: Alert and interactive, fully oriented, CN II-XII appear intact (some ? Apraxia with inability to stick out tongue), Motor is 4/5 throughout the LUE and LLE, 5/5 on the right. Sensation with hypoesthesia in the LUE and LLE. Ataxic with RAM on the left hand + HTS on the left. Romberg + unable to stand without falling to the left side. No clonus at the ankles.  Psych: euthymic, appropriate    Data review:   Labs reviewed, notable for:   K 3.4  WBC 13  UA not c/w infection  Hcg 12; hx of low level elevation chronically   Micro:  Results for orders placed or performed during the hospital encounter of 12/21/23  Resp panel by RT-PCR (RSV, Flu A&B, Covid) Anterior Nasal Swab     Status: None   Collection Time: 12/21/23  5:08 PM   Specimen: Anterior Nasal Swab  Result Value Ref Range Status   SARS Coronavirus 2 by RT PCR  NEGATIVE NEGATIVE Final    Comment: (NOTE) SARS-CoV-2 target nucleic acids are NOT DETECTED.  The SARS-CoV-2 RNA is generally detectable in upper respiratory specimens during the acute phase of infection. The lowest concentration of SARS-CoV-2 viral copies this assay can detect is 138 copies/mL. A negative result does not preclude SARS-Cov-2  infection and should not be used as the sole basis for treatment or other patient management decisions. A negative result may occur with  improper specimen collection/handling, submission of specimen other than nasopharyngeal swab, presence of viral mutation(s) within the areas targeted by this assay, and inadequate number of viral copies(<138 copies/mL). A negative result must be combined with clinical observations, patient history, and epidemiological information. The expected result is Negative.  Fact Sheet for Patients:  BloggerCourse.com  Fact Sheet for Healthcare Providers:  SeriousBroker.it  This test is no t yet approved or cleared by the [place] FDA and  has been authorized for detection and/or diagnosis of SARS-CoV-2 by FDA under an Emergency Use Authorization (EUA). This EUA will remain  in effect (meaning this test can be used) for the duration of the COVID-19 declaration under Section 564(b)(1) of the Act, 21 U.S.C.section 360bbb-3(b)(1), unless the authorization is terminated  or revoked sooner.       Influenza A by PCR NEGATIVE NEGATIVE Final   Influenza B by PCR NEGATIVE NEGATIVE Final    Comment: (NOTE) The Xpert Xpress SARS-CoV-2/FLU/RSV plus assay is intended as an aid in the diagnosis of influenza from Nasopharyngeal swab specimens and should not be used as a sole basis for treatment. Nasal washings and aspirates are unacceptable for Xpert Xpress SARS-CoV-2/FLU/RSV testing.  Fact Sheet for Patients: BloggerCourse.com  Fact Sheet for  Healthcare Providers: SeriousBroker.it  This test is not yet approved or cleared by the [place] FDA and has been authorized for detection and/or diagnosis of SARS-CoV-2 by FDA under an Emergency Use Authorization (EUA). This EUA will remain in effect (meaning this test can be used) for the duration of the COVID-19 declaration under Section 564(b)(1) of the Act, 21 U.S.C. section 360bbb-3(b)(1), unless the authorization is terminated or revoked.     Resp Syncytial Virus by PCR NEGATIVE NEGATIVE Final    Comment: (NOTE) Fact Sheet for Patients: BloggerCourse.com  Fact Sheet for Healthcare Providers: SeriousBroker.it  This test is not yet approved or cleared by the [place] FDA and has been authorized for detection and/or diagnosis of SARS-CoV-2 by FDA under an Emergency Use Authorization (EUA). This EUA will remain in effect (meaning this test can be used) for the duration of the COVID-19 declaration under Section 564(b)(1) of the Act, 21 U.S.C. section 360bbb-3(b)(1), unless the authorization is terminated or revoked.  Performed at [facility], [address], [place], [place] [address]     Imaging reviewed:  DG Chest Portable 1 View Result Date: 12/21/2023 CLINICAL DATA:  Dizziness and syncope. EXAM: PORTABLE CHEST 1 VIEW COMPARISON:  December 13, 2023 FINDINGS: The heart size and mediastinal contours are within normal limits. Both lungs are clear. The visualized skeletal structures are unremarkable. IMPRESSION: No active disease. Electronically Signed   By: Aram Candela M.D.   On: 12/21/2023 19:47    EKG:  Personally reviewed SR at 66, no acute ischemic changes    ED Course:  Treated with Valium, Meclizine, Toradol, 1 L IVF.     Assessment/Plan:  58 y.o. female with hx prior CVA with L sided weakness, Hx PE on Warfarin, SLE, RA, HTN, HLD, CKD IIIa, Mood d/o,  recent admission 2/9 - 14 for possible syncopal episode v mimic with L sided EEG abnormality and started on Keppra, who was referred by her PCP due to vertigo.   Vertigo, acute  Left sided ataxia, hypoesthesia ? Chronicity.  Left sided weakness, chronic  LKWT 2/14 evening, awoke with symptoms  of vertigo 2/15 AM. Since this time unable to be upright without vertigo symptoms. On exam there is L sided weakness (chronic), but also L sided hypoesthesia and L sided ataxia, + romberg with falling to the left. Imaging pending, plan for initial MRI as she is out of window for stroke treatments.  - Discussed with Neurology Dr.Kirkpatrick, who recommends for MRI as initial step  - MRI brain with and without contrast, additional evaluation pending study - Swallow screen then heart healthy diet - Serial neurochecks - Telemonitoring  Recent admission with possible syncope versus mimic Abnormal EEG with left hemispheric slowing and sharply contoured.  Started on Keppra 500 mg twice daily per neurology recommendation on last admission.  Concern considering absent prodrome presentation with somnolence on that admission that this prior episode may have represented a seizure -Continue Keppra 500 mg twice daily  Leukocytosis - Flu/COVID/RSV negative, check RVP with report of recent mild cough  Chronic low-level elevation in hCG Question pituitary hCG secretion postmenopausally -Repeat hCG outpatient   Hypokalemia Repleted  Chronic medical problems: Medication management: Pharm med history not completed at time of admission, med rec completed based on fill hx / recent dc summary. Attention to pharm rec once completed  History of prior CVA left-sided weakness: On AC per below. Continue Statin, additional stroke eval pending above  History of PE on warfarin: Check INR, then pharmacy to dose warfarin SLE, RA: On rituximab outpatient.  Continue home leflunomide, Plaquenil, Prednisone 5 mg daily  Hypertension:  Currently not on any antihypertensives Hyperlipidemia: Statin per above CKD stage IIIa: Creatinine at baseline 1 Mood disorder: Continue home Xanax prn Insomnia: Hold her home Ambien and patient with fall risk   There is no height or weight on file to calculate BMI.    DVT prophylaxis:  Coumadin Code Status:  Full Code Diet:  Diet Orders (From admission, onward)     Start     Ordered   12/22/23 0321  Diet Heart Room service appropriate? Yes; Fluid consistency: Thin  Diet effective now       Comments: OK for HH diet after swallow screen  Question Answer Comment  Room service appropriate? Yes   Fluid consistency: Thin      12/22/23 0321           Family Communication:  No   Consults:  None   Admission status:   Inpatient, Telemetry bed  Severity of Illness: The appropriate patient status for this patient is INPATIENT. Inpatient status is judged to be reasonable and necessary in order to provide the required intensity of service to ensure the patient's safety. The patient's presenting symptoms, physical exam findings, and initial radiographic and laboratory data in the context of their chronic comorbidities is felt to place them at high risk for further clinical deterioration. Furthermore, it is not anticipated that the patient will be medically stable for discharge from the hospital within 2 midnights of admission.   * I certify that at the point of admission it is my clinical judgment that the patient will require inpatient hospital care spanning beyond 2 midnights from the point of admission due to high intensity of service, high risk for further deterioration and high frequency of surveillance required.*   Dolly Rias, MD [facility]  How to contact the [facility] Attending or Consulting provider 7A - 7P or covering provider during after hours 7P -7A, for this patient.  Check the care team in [facility] and look for a) attending/consulting [facility] provider listed and  b) the [facility]  team listed Log into [email or web address] and use [place]'s universal password to access. If you do not have the password, please contact the hospital operator. Locate the [facility] provider you are looking for under [facility] and page to a number that you can be directly reached. If you still have difficulty reaching the provider, please page the [facility] (Director on Call) for the Hospitalists listed on amion for assistance.  12/22/2023, 3:24 AM

## 2023-12-22 NOTE — Evaluation (Addendum)
Physical Therapy Evaluation Patient Details Name: Lauren Chung MRN: [id number] DOB: [birth year] Today's Date: 12/22/2023  History of Present Illness  Pt is 58 yo female presented on 12/22/23 with dizziness and fatigue with ADLs after recent admission 2/9-2/14/25 following syncopal episode. Pt noted epsiodes of significant dizziness and activity intolerance after returning home. Pt f/u wiht PCP who referred pt back to ED. Pt with hx including but not limited to  lupus, seronegative rheumatoid arthritis, HTN, HLD, anxiety and depression, CKD stage IIIa, PTSD, history of CVA.  Clinical Impression  Pt admitted with above diagnosis. Pt able to come to sit mod I with inc time and requires time to adjust to posisiotn change, upon standing, pt has a dec in BP as noted below. Pt has progressive dizziness and decreased tolerance to mobility with cadence slowing and pt electing to sit after 5 feet. Pt reaches the other side of the bed and sits, returns to supine with callbell. Nursing notified. Pt currently with functional limitations due to the deficits listed below (see PT Problem List). Pt will benefit from acute skilled PT to increase their independence and safety with mobility to allow discharge.    BP: Sitting: 123/85 Standing: 102/71 Post activity: 109/84         If plan is discharge home, recommend the following: A little help with walking and/or transfers;A little help with bathing/dressing/bathroom   Can travel by private vehicle   Yes    Equipment Recommendations Rolling walker (2 wheels)  Recommendations for Other Services       Functional Status Assessment Patient has had a recent decline in their functional status and demonstrates the ability to make significant improvements in function in a reasonable and predictable amount of time.     Precautions / Restrictions Precautions Precautions: Fall Precaution/Restrictions Comments: syncope. balance loss when standing,  dizziness Restrictions Weight Bearing Restrictions Per Provider Order: No      Mobility  Bed Mobility Overal bed mobility: Independent Bed Mobility: Supine to Sit, Sit to Supine     Supine to sit: Modified independent (Device/Increase time) Sit to supine: Modified independent (Device/Increase time)   General bed mobility comments: has increased dizziness with position changes-see assessment for BPs    Transfers Overall transfer level: Needs assistance Equipment used: Rolling walker (2 wheels) Transfers: Sit to/from Stand Sit to Stand: Supervision                Ambulation/Gait Ambulation/Gait assistance: Contact guard assist, Supervision Gait Distance (Feet): 10 Feet Assistive device: Rolling walker (2 wheels) Gait Pattern/deviations: Decreased stride length, Trunk flexed, Step-to pattern, Decreased step length - right, Decreased step length - left Gait velocity: dec     General Gait Details: decreased cadence and distance due to dizziness and progressive fatigue with amb  Stairs         General stair comments: not assessed due to safety  Wheelchair Mobility     Tilt Bed    Modified Rankin (Stroke Patients Only)       Balance Overall balance assessment: Needs assistance Sitting-balance support: Feet supported Sitting balance-Leahy Scale: Normal Sitting balance - Comments: requires time in sitting to acommodate to posture change   Standing balance support: No upper extremity supported Standing balance-Leahy Scale: Poor Standing balance comment: pt unable to stand beyond 60 sec with 2WW due to dizziness                             Pertinent  Vitals/Pain Pain Assessment Pain Assessment: No/denies pain    Home Living Family/patient expects to be discharged to:: Private residence Living Arrangements: Children;Other relatives Available Help at Discharge: Family;Available 24 hours/day Type of Home: House Home Access: Level entry      Alternate Level Stairs-Number of Steps: flight with landing Home Layout: Two level Home Equipment: None Additional Comments: pt has autistic  son for whom she  cares for, also 2 grandchildren and another daughter in the home    Prior Function Prior Level of Function : Independent/Modified Independent;Driving                     Extremity/Trunk Assessment   Upper Extremity Assessment Upper Extremity Assessment: RUE deficits/detail RUE Deficits / Details: h/o dislocation with fear of picking up over 90 degrees.    Lower Extremity Assessment Lower Extremity Assessment: Defer to PT evaluation    Cervical / Trunk Assessment Cervical / Trunk Assessment: Normal  Communication   Communication Communication: No apparent difficulties    Cognition Arousal: Alert Behavior During Therapy: WFL for tasks assessed/performed   PT - Cognitive impairments: No apparent impairments                                 Cueing       General Comments General comments (skin integrity, edema, etc.): progressive dizziness with activity, orthostatic    Exercises     Assessment/Plan    PT Assessment Patient needs continued PT services  PT Problem List Decreased strength;Decreased cognition;Decreased knowledge of precautions;Decreased mobility;Decreased activity tolerance;Decreased knowledge of use of DME;Pain;Decreased safety awareness       PT Treatment Interventions DME instruction;Functional mobility training;Balance training;Patient/family education;Therapeutic activities;Gait training;Stair training;Therapeutic exercise;Cognitive remediation    PT Goals (Current goals can be found in the Care Plan section)  Acute Rehab PT Goals Patient Stated Goal: improve activity tolerance PT Goal Formulation: With patient Time For Goal Achievement: 01/05/24 Potential to Achieve Goals: Good    Frequency Min 1X/week     Co-evaluation PT/OT/SLP Co-Evaluation/Treatment:  Yes Reason for Co-Treatment: To address functional/ADL transfers;Complexity of the patient's impairments (multi-system involvement) PT goals addressed during session: Mobility/safety with mobility         AM-PAC PT "6 Clicks" Mobility  Outcome Measure Help needed turning from your back to your side while in a flat bed without using bedrails?: None Help needed moving from lying on your back to sitting on the side of a flat bed without using bedrails?: None Help needed moving to and from a bed to a chair (including a wheelchair)?: A Little Help needed standing up from a chair using your arms (e.g., wheelchair or bedside chair)?: A Little Help needed to walk in hospital room?: A Little Help needed climbing 3-5 steps with a railing? : Total 6 Click Score: 18    End of Session Equipment Utilized During Treatment: Gait belt Activity Tolerance: Patient limited by fatigue;Other (comment) (dizziness) Patient left: with call bell/phone within reach;in bed Nurse Communication: Mobility status PT Visit Diagnosis: Unsteadiness on feet (R26.81);Muscle weakness (generalized) (M62.81);Difficulty in walking, not elsewhere classified (R26.2);Dizziness and giddiness (R42)    Time: 1610-9604 PT Time Calculation (min) (ACUTE ONLY): 18 min   Charges:   PT Evaluation $PT Eval Low Complexity: 1 Low   PT General Charges $$ ACUTE PT VISIT: 1 Visit         Madaline Guthrie, PT Acute Rehabilitation Services Office: [phone] 12/22/2023  Evelena Peat 12/22/2023, 10:45 AM

## 2023-12-23 ENCOUNTER — Ambulatory Visit (HOSPITAL_COMMUNITY): Payer: 59 | Admitting: Psychiatry

## 2023-12-23 ENCOUNTER — Inpatient Hospital Stay (HOSPITAL_COMMUNITY): Payer: 59

## 2023-12-23 DIAGNOSIS — R569 Unspecified convulsions: Secondary | ICD-10-CM

## 2023-12-23 DIAGNOSIS — R2990 Unspecified symptoms and signs involving the nervous system: Secondary | ICD-10-CM | POA: Diagnosis not present

## 2023-12-23 DIAGNOSIS — R42 Dizziness and giddiness: Secondary | ICD-10-CM | POA: Diagnosis not present

## 2023-12-23 LAB — CBC WITH DIFFERENTIAL/PLATELET
Abs Immature Granulocytes: 0.12 10*3/uL — ABNORMAL HIGH (ref 0.00–0.07)
Basophils Absolute: 0.1 10*3/uL (ref 0.0–0.1)
Basophils Relative: 1 %
Eosinophils Absolute: 0.3 10*3/uL (ref 0.0–0.5)
Eosinophils Relative: 2 %
HCT: 41.7 % (ref 36.0–46.0)
Hemoglobin: 13.9 g/dL (ref 12.0–15.0)
Immature Granulocytes: 1 %
Lymphocytes Relative: 25 %
Lymphs Abs: 3.2 10*3/uL (ref 0.7–4.0)
MCH: 30.4 pg (ref 26.0–34.0)
MCHC: 33.3 g/dL (ref 30.0–36.0)
MCV: 91.2 fL (ref 80.0–100.0)
Monocytes Absolute: 1 10*3/uL (ref 0.1–1.0)
Monocytes Relative: 8 %
Neutro Abs: 8 10*3/uL — ABNORMAL HIGH (ref 1.7–7.7)
Neutrophils Relative %: 63 %
Platelets: 292 10*3/uL (ref 150–400)
RBC: 4.57 MIL/uL (ref 3.87–5.11)
RDW: 13.9 % (ref 11.5–15.5)
WBC: 12.7 10*3/uL — ABNORMAL HIGH (ref 4.0–10.5)
nRBC: 0 % (ref 0.0–0.2)

## 2023-12-23 LAB — COMPREHENSIVE METABOLIC PANEL
ALT: 33 U/L (ref 0–44)
AST: 29 U/L (ref 15–41)
Albumin: 3.2 g/dL — ABNORMAL LOW (ref 3.5–5.0)
Alkaline Phosphatase: 72 U/L (ref 38–126)
Anion gap: 9 (ref 5–15)
BUN: 20 mg/dL (ref 6–20)
CO2: 25 mmol/L (ref 22–32)
Calcium: 8.3 mg/dL — ABNORMAL LOW (ref 8.9–10.3)
Chloride: 106 mmol/L (ref 98–111)
Creatinine, Ser: 1.25 mg/dL — ABNORMAL HIGH (ref 0.44–1.00)
GFR, Estimated: 51 mL/min — ABNORMAL LOW (ref >60)
Glucose, Bld: 87 mg/dL (ref 70–99)
Potassium: 3.5 mmol/L (ref 3.5–5.1)
Sodium: 140 mmol/L (ref 135–145)
Total Bilirubin: 0.5 mg/dL (ref 0.0–1.2)
Total Protein: 6.1 g/dL — ABNORMAL LOW (ref 6.5–8.1)

## 2023-12-23 LAB — MAGNESIUM: Magnesium: 1.8 mg/dL (ref 1.7–2.4)

## 2023-12-23 LAB — PROTIME-INR
INR: 2.3 — ABNORMAL HIGH (ref 0.8–1.2)
Prothrombin Time: 25.1 s — ABNORMAL HIGH (ref 11.4–15.2)

## 2023-12-23 LAB — PHOSPHORUS: Phosphorus: 2.9 mg/dL (ref 2.5–4.6)

## 2023-12-23 MED ORDER — WARFARIN SODIUM 4 MG PO TABS
4.0000 mg | ORAL_TABLET | Freq: Once | ORAL | Status: AC
Start: 2023-12-23 — End: 2023-12-23
  Administered 2023-12-23: 4 mg via ORAL
  Filled 2023-12-23: qty 1

## 2023-12-23 MED ORDER — SODIUM CHLORIDE 0.9 % IV SOLN: INTRAVENOUS | Status: DC

## 2023-12-23 NOTE — Plan of Care (Signed)

## 2023-12-23 NOTE — Procedures (Signed)
Patient Name: Lauren Chung  MRN: [id number]  Epilepsy Attending: Charlsie Quest  Referring Physician/Provider: Elmer Picker, NP  Date: 12/23/2023 Duration: 22.34 mins  Patient history: 58 year old female with history of CVA with left-sided weakness, PE on Coumadin, recent admission with syncope and confusion. EEG to evaluate for seizure  Level of alertness: Awake  AEDs during EEG study: LEV  Technical aspects: This EEG study was done with scalp electrodes positioned according to the 10-20 International system of electrode placement. Electrical activity was reviewed with band pass filter of 1-70Hz , sensitivity of 7 uV/mm, display speed of 66mm/sec with a 60Hz  notched filter applied as appropriate. EEG data were recorded continuously and digitally stored.  Video monitoring was available and reviewed as appropriate.  Description: The posterior dominant rhythm consists of 9 Hz activity of moderate voltage (25-35 uV) seen predominantly in posterior head regions, symmetric and reactive to eye opening and eye closing. Physiologic photic driving was not seen during photic stimulation. Hyperventilation was not performed.     IMPRESSION: This study is within normal limits. No seizures or epileptiform discharges were seen throughout the recording.  A normal interictal EEG does not exclude the diagnosis of epilepsy.  Adden Strout Annabelle Harman

## 2023-12-23 NOTE — Progress Notes (Signed)
NEUROLOGY CONSULT FOLLOW UP NOTE   Date of service: December 23, 2023 Patient Name: Lauren Chung MRN:  [id number] DOB:  1965-11-27  Interval Hx/subjective   Patient has been afebrile and hemodynamically stable overnight.  She reports that her dizziness is improved today.  She states that it has been only going on for a few weeks and does not recall reports of dizziness with migraines that she made at a neurologist appointment in 2017.  Heart rate was noted to increase when patient was standing on orthostatic vital signs.    Vitals   Vitals:   12/22/23 1949 12/22/23 2337 12/23/23 0323 12/23/23 0721  BP: 102/66 113/65 98/62 125/85  Pulse: 92 98 87 64  Resp:  18 18 17   Temp: 98.4 F (36.9 C) 98.2 F (36.8 C) 97.8 F (36.6 C) 98 F (36.7 C)  TempSrc: Oral Oral Oral Oral  SpO2: 99% 97% 99% 99%  Weight:      Height:         Body mass index is 26.76 kg/m.  Physical Exam   Constitutional: Appears well-developed and well-nourished.  Psych: Affect appropriate to situation.  Eyes: No scleral injection.  HENT: No OP obstrucion.  Head: Normocephalic.  Respiratory: Effort normal, non-labored breathing.  Skin: WDI.   Neurologic Examination    NEURO:  Mental Status: Alert and oriented to person place time and situation, able to give clear and coherent history of present illness Speech/Language: speech is without dysarthria or aphasia.    Cranial Nerves:  II: PERRL.  III, IV, VI: EOMI. Eyelids elevate symmetrically.  V: Sensation is intact to light touch and symmetrical to face.  VII: Smile is symmetrical. VIII: hearing intact to voice. IX, X: Phonation is normal.  WU:JWJXBJYN shrug 5/5. XII: tongue is midline without fasciculations. Motor: 5/5 strength to right upper extremity, 4+/5 strength to left upper extremity, 5/5 strength to bilateral lower extremities Tone: is normal and bulk is normal Sensation- Intact to light touch bilaterally.   Coordination: FTN intact  bilaterally Gait- deferred  Hints exam negative    Medications  Current Facility-Administered Medications:     stroke: early stages of recovery book, , Does not apply, Once, Segars, Christiane Ha, MD   ALPRAZolam Prudy Feeler) tablet 1 mg, 1 mg, Oral, BID PRN, Dolly Rias, MD   hydroxychloroquine (PLAQUENIL) tablet 200 mg, 200 mg, Oral, Daily, Segars, Christiane Ha, MD, 200 mg at 12/22/23 1745   levETIRAcetam (KEPPRA) tablet 500 mg, 500 mg, Oral, BID, Segars, Christiane Ha, MD, 500 mg at 12/22/23 2144   LORazepam (ATIVAN) tablet 0.5 mg, 0.5 mg, Oral, PRN **OR** LORazepam (ATIVAN) injection 0.5 mg, 0.5 mg, Intravenous, PRN, Dolly Rias, MD, 0.5 mg at 12/22/23 0600   meclizine (ANTIVERT) tablet 25 mg, 25 mg, Oral, TID PRN, Dolly Rias, MD   melatonin tablet 6 mg, 6 mg, Oral, QHS PRN, Segars, Christiane Ha, MD   predniSONE (DELTASONE) tablet 5 mg, 5 mg, Oral, Q breakfast, Segars, Christiane Ha, MD, 5 mg at 12/22/23 1328   rosuvastatin (CRESTOR) tablet 10 mg, 10 mg, Oral, q AM, Segars, Christiane Ha, MD, 10 mg at 12/23/23 0650   sodium chloride flush (NS) 0.9 % injection 3 mL, 3 mL, Intravenous, Q12H, Segars, Christiane Ha, MD, 3 mL at 12/22/23 2144   Warfarin - Pharmacist Dosing Inpatient, , Does not apply, q1600, Maurice March [facility]  Labs and Diagnostic Imaging   CBC:  Recent Labs  Lab 12/21/23 1706 12/22/23 0317  WBC 13.1* 12.7*  NEUTROABS 11.4*  --   HGB 13.8 12.5  HCT 42.5 38.4  MCV 92.4 93.4  PLT 252 232    Basic Metabolic Panel:  Lab Results  Component Value Date   NA 137 12/22/2023   K 4.0 12/22/2023   CO2 23 12/22/2023   GLUCOSE 78 12/22/2023   BUN 21 (H) 12/22/2023   CREATININE 0.98 12/22/2023   CALCIUM 8.5 (L) 12/22/2023   GFRNONAA >60 12/22/2023   GFRAA 46 (L) 06/13/2020   Lipid Panel:  Lab Results  Component Value Date   LDLCALC 68 12/22/2023   HgbA1c:  Lab Results  Component Value Date   HGBA1C 4.8 12/22/2023   Urine Drug Screen:     Component Value Date/Time    LABOPIA NONE DETECTED 12/13/2023 1701   COCAINSCRNUR NONE DETECTED 12/13/2023 1701   LABBENZ POSITIVE (A) 12/13/2023 1701   AMPHETMU NONE DETECTED 12/13/2023 1701   THCU NONE DETECTED 12/13/2023 1701   LABBARB NONE DETECTED 12/13/2023 1701    Alcohol Level     Component Value Date/Time   ETH <10 03/05/2022 1554   INR  Lab Results  Component Value Date   INR 2.3 (H) 12/23/2023   APTT  Lab Results  Component Value Date   APTT 26 03/05/2022   CT Head without contrast(Personally reviewed): No acute abnormality, mild chronic microvascular ischemic changes  MRI Brain(Personally reviewed): No acute abnormality, chronic microvascular ischemic changes  rEEG:  Pending  Assessment   Lauren Chung is a 58 y.o. female with history of stroke with left-sided weakness, PE on warfarin, lupus, rheumatoid arthritis, hypertension, hyperlipidemia, CKD 3A and mood disorder who had a recent admission for syncopal episode with left-sided intermittent slowing with sharp contorting-for which she was started on Keppra a couple of days ago.  Patient initially did well after discharge but then developed worsening dizziness and return to the emergency department.  She states that sometimes the dizziness occurs when she is sitting or lying and sometimes it worsens when she is standing up but not always.  She states that meclizine does not help with this much but the dizziness is improving on its own.  Given increase in heart rate with standing and negative hints exam, feel that dizziness is likely peripheral in origin and will await vestibular evaluation with PT.  Routine EEG this admission reveals no seizures or epileptiform discharges.   Her dizziness has been present for many months to years with subacute worsening.  Keppra can cause some dizziness.  She was started on Keppra for the routine EEG a few days ago that showed intermittent left-sided slowing. The recommendation per the epileptologist was to  do a prolonged EEG if concern remained for interictal activity but the patient was started on Keppra and discharged home.  To best help the patient, I think we first need a proper diagnosis.  For that reason, it would be prudent to obtain long-term EEG.  We will obtain this off Keppra.  Final decision on necessitation of AEDs based on clinical course and EEG results  Recommendations  -Discontinue Keppra for now - Long-term EEG -Continue Coumadin with INR goal 2-3 - Continue telemetry monitoring - Vestibular PT eval - Awaiting repeat orthostatic vitals Will follow Plan relayed to Dr. Marland Mcalpine ______________________________________________________________________ Patient seen by NP and then by MD, MD to edit note as needed.  Signed, Cortney E Ernestina Columbia, NP [facility]    Attending Neurohospitalist Addendum Patient seen and examined with APP/Resident. Agree with the history and physical as documented above. Agree with the plan as documented, which I  helped formulate. I have independently reviewed the chart, obtained history, review of systems and examined the patient.I have personally reviewed pertinent head/neck/spine imaging (CT/MRI). Please feel free to call with any questions.  -- Milon Dikes, MD Neurologist [facility] Pager: [phone]

## 2023-12-23 NOTE — Progress Notes (Signed)
 LTM EEG hooked up and running - no initial skin breakdown - push button tested - [facility] monitoring.

## 2023-12-23 NOTE — Progress Notes (Signed)
 EEG complete - results pending

## 2023-12-23 NOTE — Plan of Care (Signed)
  Problem: Education: Goal: Knowledge of disease or condition will improve 12/23/2023 1618 by Irish Lack, RN Outcome: Progressing 12/23/2023 1525 by Irish Lack, RN Outcome: Progressing Goal: Knowledge of secondary prevention will improve (MUST DOCUMENT ALL) Outcome: Progressing Goal: Knowledge of patient specific risk factors will improve (DELETE if not current risk factor) Outcome: Progressing   Problem: Ischemic Stroke/TIA Tissue Perfusion: Goal: Complications of ischemic stroke/TIA will be minimized Outcome: Progressing   Problem: Coping: Goal: Will verbalize positive feelings about self 12/23/2023 1618 by Irish Lack, RN Outcome: Progressing 12/23/2023 1525 by Irish Lack, RN Outcome: Progressing Goal: Will identify appropriate support needs 12/23/2023 1618 by Irish Lack, RN Outcome: Progressing 12/23/2023 1525 by Irish Lack, RN Outcome: Progressing   Problem: Health Behavior/Discharge Planning: Goal: Ability to manage health-related needs will improve 12/23/2023 1618 by Irish Lack, RN Outcome: Progressing 12/23/2023 1525 by Irish Lack, RN Outcome: Progressing Goal: Goals will be collaboratively established with patient/family 12/23/2023 1618 by Irish Lack, RN Outcome: Progressing 12/23/2023 1525 by Irish Lack, RN Outcome: Progressing   Problem: Self-Care: Goal: Ability to participate in self-care as condition permits will improve Outcome: Progressing Goal: Verbalization of feelings and concerns over difficulty with self-care will improve Outcome: Progressing Goal: Ability to communicate needs accurately will improve Outcome: Progressing   Problem: Nutrition: Goal: Risk of aspiration will decrease Outcome: Progressing Goal: Dietary intake will improve Outcome: Progressing   Problem: Education: Goal: Knowledge of General Education information will improve Description: Including pain rating  scale, medication(s)/side effects and non-pharmacologic comfort measures Outcome: Progressing   Problem: Health Behavior/Discharge Planning: Goal: Ability to manage health-related needs will improve Outcome: Progressing   Problem: Clinical Measurements: Goal: Ability to maintain clinical measurements within normal limits will improve Outcome: Progressing Goal: Will remain free from infection Outcome: Progressing Goal: Diagnostic test results will improve Outcome: Progressing Goal: Respiratory complications will improve Outcome: Progressing Goal: Cardiovascular complication will be avoided Outcome: Progressing   Problem: Activity: Goal: Risk for activity intolerance will decrease Outcome: Progressing   Problem: Nutrition: Goal: Adequate nutrition will be maintained Outcome: Progressing   Problem: Coping: Goal: Level of anxiety will decrease Outcome: Progressing   Problem: Elimination: Goal: Will not experience complications related to bowel motility Outcome: Progressing Goal: Will not experience complications related to urinary retention Outcome: Progressing   Problem: Pain Managment: Goal: General experience of comfort will improve and/or be controlled Outcome: Progressing   Problem: Safety: Goal: Ability to remain free from injury will improve Outcome: Progressing   Problem: Skin Integrity: Goal: Risk for impaired skin integrity will decrease Outcome: Progressing

## 2023-12-23 NOTE — Progress Notes (Signed)
PHARMACY - ANTICOAGULATION CONSULT NOTE  Pharmacy Consult for warfarin Indication: hx of pulmonary embolus  Allergies  Allergen Reactions   Acetaminophen Nausea And Vomiting   Belsomra [Suvorexant] Shortness Of Breath and Rash   Codeine Shortness Of Breath   Iodinated Contrast Media Shortness Of Breath    Patient Measurements: Height: 5\' 8"  (172.7 cm) Weight: 79.8 kg (176 lb) IBW/kg (Calculated) : 63.9   Vital Signs: Temp: 98 F (36.7 C) (02/19 1056) Temp Source: Oral (02/19 1056) BP: 114/66 (02/19 1056) Pulse Rate: 82 (02/19 1056)  Labs: Recent Labs    12/21/23 1706 12/22/23 0317 12/22/23 0445 12/23/23 0612 12/23/23 0940  HGB 13.8 12.5  --   --  13.9  HCT 42.5 38.4  --   --  41.7  PLT 252 232  --   --  292  LABPROT  --  28.0*  --  25.1*  --   INR  --  2.6*  --  2.3*  --   CREATININE 1.09*  --  0.98  --  1.25*  TROPONINIHS 4  --   --   --   --     Estimated Creatinine Clearance: 55.8 mL/min (A) (by C-G formula based on SCr of 1.25 mg/dL (H)).   Medical History: Past Medical History:  Diagnosis Date   Anemia    Anxiety    Arthritis    CKD (chronic kidney disease) stage 3, GFR 30-59 ml/min (HCC)    Depression    History of blood transfusion    in [place]   Hyperlipidemia    Hypertension    Lupus    PE (pulmonary embolism)    Pneumonia    x 3 - last time 2017   PTSD (post-traumatic stress disorder)    Renal disorder    Rheumatoid arthritis (HCC)    Seizures (HCC)    Stroke (HCC)    many years ago per patient, no deficits   SVD (spontaneous vaginal delivery)    x 3   TIA (transient ischemic attack)    many years ago per patient, no deficits    Assessment: 58 y.o. female with hx of prior CVA with L sided weakness, Hx PE on warfarin, SLE, RA, HTN, HLD, CKD IIIa, mood d/o.  Patient on Warfarin PTA with therapeutic INR on admission.  Pharmacy consulted to continue warfarin dosing.  Home dose warfarin 4mg  daily INR 2.3, CBC WNL  Goal of Therapy:  INR  2-3 Monitor platelets by anticoagulation protocol: Yes   Plan:  Warfarin 4mg  po once today at 1600 Daily INR   [employer], Pharm.D., BCPS Clinical Pharmacist Clinical phone for 12/23/2023 from 7:30-3:00 is [phone].  **Pharmacist phone directory can be found on [email or web address] listed under [facility].  12/23/2023 2:20 PM

## 2023-12-23 NOTE — Progress Notes (Signed)
Physical Therapy Treatment Patient Details Name: Lauren Chung MRN: [id number] DOB: 1966-02-21 Today's Date: 12/23/2023   History of Present Illness Pt is 58 yo female presented on 12/22/23 with dizziness and fatigue with ADLs after recent admission 2/9-2/14/25 following syncopal episode. Pt noted epsiodes of significant dizziness and activity intolerance after returning home. Pt f/u wiht PCP who referred pt back to ED. Pt with hx including but not limited to  lupus, seronegative rheumatoid arthritis, HTN, HLD, anxiety and depression, CKD stage IIIa, PTSD, history of CVA.    PT Comments  Patient denies dizziness this morning. Describes dizziness as a "spinning inside my head" and imbalance. See vitals flowsheet for orthostatic BPs which were negative. Patient without dizziness with supine to sit. Once standing for 1.5 minutes she began to feel dizzy and after 3.5 minutes had to sit down due to dizziness and feeling off-balance. Vestibular eval results below with normal results. Attempted ambulation without a device and pt became dizzy and off-balance after 5 feet. Returned to sitting and then walked 40 ft with RW and CGA and 2 standing rest breaks. Patient up in chair at end of session. Currently recommend someone with her at all times when mobilizing due to imbalance.    If plan is discharge home, recommend the following: A little help with walking and/or transfers;A little help with bathing/dressing/bathroom;Assistance with cooking/housework;Help with stairs or ramp for entrance   Can travel by private vehicle     Yes  Equipment Recommendations  Rolling walker (2 wheels)    Recommendations for Other Services       Precautions / Restrictions Precautions Precautions: Fall Precaution/Restrictions Comments: syncope. balance loss when standing, dizziness Restrictions Weight Bearing Restrictions Per Provider Order: No      12/23/23 0946  Vestibular Assessment  General Observation denies  dizziness this a.m. Sitting upright in bed on arrival  Symptom Behavior  Subjective history of current problem feels a spinning inside her head when moving supine to sit and sit to stand/walking. Causes imbalance. Worse when she looks straight ahead and better when she looks down towards feet.  Type of Dizziness  Spinning  Frequency of Dizziness daily with mobility  Duration of Dizziness minutes  Symptom Nature Motion provoked  Aggravating Factors Supine to sit;Sit to stand  Relieving Factors Head stationary;Comments (looking down when walking)  Progression of Symptoms Better  Oculomotor Exam  Oculomotor Alignment Normal  Ocular ROM WNL  Spontaneous Absent  Gaze-induced  Absent  Smooth Pursuits Intact  Saccades Intact  Positional Testing  Dix-Hallpike Dix-Hallpike Right;Dix-Hallpike Left  Horizontal Canal Testing Horizontal Canal Right;Horizontal Canal Left  Dix-Hallpike Right  Dix-Hallpike Right Duration 0  Dix-Hallpike Right Symptoms No nystagmus  Dix-Hallpike Left  Dix-Hallpike Left Duration 0  Dix-Hallpike Left Symptoms No nystagmus  Horizontal Canal Right  Horizontal Canal Right Duration 0  Horizontal Canal Right Symptoms Normal  Horizontal Canal Left  Horizontal Canal Left Duration 0  Horizontal Canal Left Symptoms Normal  Cognition  Cognition Orientation Level Oriented x 4  Orthostatics  Orthostatics Comment see vitals flowsheet (negative for orthostasis)   Mobility  Bed Mobility Overal bed mobility: Independent Bed Mobility: Supine to Sit, Sit to Supine     Supine to sit: Independent Sit to supine: Independent   General bed mobility comments: denies dizziness with changes in position    Transfers Overall transfer level: Needs assistance Equipment used: Rolling walker (2 wheels), None Transfers: Sit to/from Stand Sit to Stand: Contact guard assist  General transfer comment: no device x 2 with CGA due to h/o dizziness and imbalance; with RW x  1 with cues for proper use    Ambulation/Gait Ambulation/Gait assistance: Contact guard assist, Min assist Gait Distance (Feet): 5 Feet (no device, 40 RW) Assistive device: Rolling walker (2 wheels), None Gait Pattern/deviations: Decreased stride length, Trunk flexed, Step-to pattern, Decreased step length - right, Decreased step length - left, Staggering right Gait velocity: dec     General Gait Details: decreased cadence and distance due to dizziness; standing rest x 2   Stairs         General stair comments: not assessed due to dizziness and imbalance   Wheelchair Mobility     Tilt Bed    Modified Rankin (Stroke Patients Only)       Balance Overall balance assessment: Needs assistance Sitting-balance support: Feet supported Sitting balance-Leahy Scale: Normal     Standing balance support: No upper extremity supported Standing balance-Leahy Scale: Fair Standing balance comment: able to stand 1.5 minutes before slight dizziness begins; must sit after 3 minutes                            Communication Communication Communication: No apparent difficulties  Cognition Arousal: Alert Behavior During Therapy: WFL for tasks assessed/performed   PT - Cognitive impairments: No apparent impairments                       PT - Cognition Comments: Headache no longer present.  Pt talkative, clear, and demonstrates safety awareness.  She is now aware of correct date and day of week.  She recalls events during hospitalization and provided consistent history but still unclear of events that led to admission.  Provided 3 words for pt to recall - recalled 3/3 immediately and 2/3 after 15 mins. Following commands: Intact      Cueing Cueing Techniques: Verbal cues  Exercises      General Comments        Pertinent Vitals/Pain Pain Assessment Pain Assessment: No/denies pain Faces Pain Scale: No hurt    Home Living                           Prior Function            PT Goals (current goals can now be found in the care plan section) Acute Rehab PT Goals Patient Stated Goal: improve activity tolerance Time For Goal Achievement: 01/05/24 Potential to Achieve Goals: Good Progress towards PT goals: Not progressing toward goals - comment (continues to be limited by dizziness)    Frequency    Min 1X/week      PT Plan      Co-evaluation              AM-PAC PT "6 Clicks" Mobility   Outcome Measure  Help needed turning from your back to your side while in a flat bed without using bedrails?: None Help needed moving from lying on your back to sitting on the side of a flat bed without using bedrails?: None Help needed moving to and from a bed to a chair (including a wheelchair)?: A Little Help needed standing up from a chair using your arms (e.g., wheelchair or bedside chair)?: A Little Help needed to walk in hospital room?: A Little Help needed climbing 3-5 steps with a railing? : A Little 6 Click Score: 20  End of Session Equipment Utilized During Treatment: Gait belt Activity Tolerance: Treatment limited secondary to medical complications (Comment) (dizziness) Patient left: with call bell/phone within reach;in chair (no chair alarm box in room; pt verbalizes not to get up alone and demonstrates how to use callbell) Nurse Communication: Mobility status PT Visit Diagnosis: Unsteadiness on feet (R26.81);Difficulty in walking, not elsewhere classified (R26.2);Dizziness and giddiness (R42)     Time: 5284-1324 PT Time Calculation (min) (ACUTE ONLY): 24 min  Charges:    $Gait Training: 8-22 mins $Physical Performance Test: 8-22 mins PT General Charges $$ ACUTE PT VISIT: 1 Visit                      Jerolyn Center, PT Acute Rehabilitation Services  Office [phone]    Zena Amos 12/23/2023, 9:49 AM

## 2023-12-23 NOTE — Hospital Course (Addendum)
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  She reported feeling well on the 14th but upon waking on the 15th she noted that she was unable to sit or stand during a feeling of "dizziness."  It was difficult for her to differentiate between lightheadedness and vertigo type symptoms.  She had to "keep her head down" when walking around she feels like she will fall.  She also had a headache over the last few days and denied any subjective weakness or any vision changes.  Due to persistent symptoms she saw her PCP who referred her back to the ED and the admitting physician interviewed her and she had dizziness and a headache at that time and was unable to stand without falling.  Neurology was consulted and now planning for LTM EEG and have taken her off the Keppra at the as they feel that this may be contributing.  Assessment and Plan:  Vertigo, Acute  Left sided ataxia, hypoesthesia ? Chronicity.  Left sided weakness, chronic  -LKWT 2/14 evening, awoke with symptoms of vertigo 2/15 AM. Since this time unable to be upright without vertigo symptoms. On exam there is L sided weakness (chronic), but also L sided hypoesthesia and L sided ataxia, + romberg with falling to the left. Imaging pending, plan for initial MRI as she is out of window for stroke treatments.  -Discussed with Neurology Dr.Kirkpatrick, who recommends for MRI as initial step  -MRI brain with and without contrast done and showed "No acute intracranial abnormality. Stable age advanced, nonspecific  chronic cerebral white matter signal abnormality."  -Swallow screen then heart healthy diet -Serial neurochecks -C/w Tele monitoring   Recent admission with possible Syncope versus Mimic -Abnormal EEG with left hemispheric slowing and sharply contoured.  Started on Keppra 500 mg twice daily per neurology recommendation on last admission.  Concern considering absent prodrome presentation with somnolence on that admission that this prior episode may have represented a seizure -Keppra 500 mg twice daily held by Neurology -Start IVF with NS at 75 mL/hr -Getting LTM EEG Monitoring -PT/OT to evaluate and Treat -Repeat Orthostatic VS in the AM   Leukocytosis -Flu/COVID/RSV negative, check RVP with report of recent mild cough -WBC Trend: Recent Labs  Lab 12/13/23 1522 12/14/23 0539 12/15/23 0615 12/16/23 0553 12/21/23 1706 12/22/23 0317 12/23/23 0940  WBC 8.0 9.7 10.7* 11.2* 13.1* 12.7* 12.7*  -Continue to Monitor for S/Sx of infection -Repeat CBC in the AM   Chronic low-level elevation in hCG -Question pituitary hCG secretion postmenopausally -Repeat hCG outpatient    Hypokalemia -Patient's K+ Level Trend: Recent Labs  Lab 12/14/23 0200 12/15/23 0615 12/16/23 0553 12/17/23 0500 12/21/23 1706 12/22/23 0445 12/23/23 0940  K 3.8 3.2* 3.3* 3.6 3.4* 4.0 3.5  -Replete with po Kcl 40 mEQ x1 -Continue to Monitor and Replete as Necessary -Repeat CMP in the AM   CKD Stage 3a -BUN/Cr Trend: Recent Labs  Lab 12/14/23 0200 12/15/23 0615 12/16/23 0553 12/17/23 0500 12/21/23 1706 12/22/23 0445 12/23/23 0940  BUN 14 20 11 12 18  21* 20  CREATININE 1.18* 1.20* 1.06* 1.19* 1.09* 0.98 1.25*  -  Start IVF with NS at 75 ML/hr -Avoid Nephrotoxic Medications, Contrast Dyes, Hypotension and Dehydration to Ensure Adequate Renal Perfusion and will need to Renally Adjust Meds -Continue to Monitor and Trend Renal Function carefully and repeat CMP in the AM    Chronic medical  problems: Medication management: Pharm med history not completed at time of admission, med rec completed based on fill hx / recent dc summary. Attention to pharm rec once completed   History of prior CVA left-sided weakness: On AC per below. Continue Statin, additional stroke eval pending above   History of PE on warfarin: Check INR, then pharmacy to dose warfarin  SLE, RA -On rituximab outpatient.   -Continue home leflunomide, Plaquenil, Prednisone 5 mg daily   Hypertension -Currently not on any antihypertensives  Hyperlipidemia -Statin per above  Mood disorder -Continue home Xanax prn  Insomnia -Hold her home Ambien and patient with fall risk  Hypoalbuminemia -Patient's Albumin Trend: Recent Labs  Lab 12/13/23 1522 12/15/23 0615 12/21/23 1706 12/23/23 0940  ALBUMIN 3.9 3.1* 3.8 3.2*  -Continue to Monitor and Trend and repeat CMP in the AM

## 2023-12-23 NOTE — TOC Initial Note (Addendum)
Transition of Care [facility]) - Initial/Assessment Note    Patient Details  Name: Lauren Chung MRN: [id number] Date of Birth: February 07, 1966  [facility] CM/SW Contact:    Kermit Balo, RN Phone Number: 12/23/2023, 12:10 PM  Clinical Narrative:                  Pt admitted with Vertigo. Currently on LTEEG.  She is from home with her adult children. She has someone with her most of the time.  Pt was driving but her daughter can assist with transportation. She does admit to forgetting her medications. CM and pt discussed setting alarms on her phone to help remind her to take her meds.  Pt has walker at home but not the tub bench. CM has sent the order to [employer] to see if she can receive this DME. Pt has transportation home when medically ready.   SDOH Interventions Today    Flowsheet Row Most Recent Value  SDOH Interventions   Food Insecurity Interventions [id number] Referral, Inpatient TOC  Utilities Interventions [id number] Referral, Inpatient [facility]        Expected Discharge Plan: Home w Home Health Services Barriers to Discharge: Continued Medical Work up   Patient Goals and CMS Choice   CMS Medicare.gov Compare Post Acute Care list provided to:: Patient Choice offered to / list presented to : Patient      Expected Discharge Plan and Services   Discharge Planning Services: CM Consult Post Acute Care Choice: Home Health Living arrangements for the past 2 months: Single Family Home Expected Discharge Date: 12/24/23                         HH Arranged: PT, OT HH Agency: [facility] Date [facility] Agency Contacted: 12/23/23   Representative spoke with at [facility] Agency: Kandee Keen  Prior Living Arrangements/Services Living arrangements for the past 2 months: Single Family Home Lives with:: Adult Children Patient language and need for interpreter reviewed:: Yes Do you feel safe going back to the place where you live?: Yes        Care giver support system  in place?: Yes (comment) Current home services: DME (walker) Criminal Activity/Legal Involvement Pertinent to Current Situation/Hospitalization: No - Comment as needed  Activities of Daily Living   ADL Screening (condition at time of admission) Independently performs ADLs?: Yes (appropriate for developmental age) Is the patient deaf or have difficulty hearing?: No Does the patient have difficulty seeing, even when wearing glasses/contacts?: No Does the patient have difficulty concentrating, remembering, or making decisions?: No  Permission Sought/Granted                  Emotional Assessment Appearance:: Appears stated age Attitude/Demeanor/Rapport: Engaged Affect (typically observed): Accepting Orientation: : Oriented to Self, Oriented to Place, Oriented to  Time, Oriented to Situation   Psych Involvement: No (comment)  Admission diagnosis:  Dizziness [R42] Vertigo [R42] Patient Active Problem List   Diagnosis Date Noted   Vertigo 12/22/2023   Stroke-like symptoms 12/22/2023   Hypersomnolence 12/13/2023   Abnormal urinalysis 12/13/2023   Seronegative rheumatoid arthritis (HCC) 12/13/2023   HLD (hyperlipidemia) 12/13/2023   CKD (chronic kidney disease), stage III (HCC) 12/13/2023   Iron deficiency anemia 05/26/2022   Left-sided weakness 03/05/2022   Dysarthria 03/05/2022   Normocytic anemia 09/02/2018   Fever 09/01/2018   Sepsis (HCC) 11/16/2017   HTN (hypertension) 11/16/2017   History of CVA (cerebrovascular accident)  History of pulmonary embolism    SLE (systemic lupus erythematosus) (HCC)    Anxiety and depression    AKI (acute kidney injury) (HCC)    Severe recurrent major depression without psychotic features (HCC) 09/30/2016    Class: Chronic   PCP:  Tally Joe, MD Pharmacy:   [facility] [address], [place] - [address] [place] [address] Phone: [phone] Fax:  [phone]     Social Drivers of Health (SDOH) Social History: SDOH Screenings   Food Insecurity: Food Insecurity Present (12/22/2023)  Housing: Low Risk  (12/22/2023)  Transportation Needs: No Transportation Needs (12/22/2023)  Utilities: At Risk (12/22/2023)  Social Connections: Socially Isolated (12/22/2023)  Tobacco Use: Low Risk  (12/22/2023)   SDOH Interventions:     Readmission Risk Interventions     No data to display

## 2023-12-23 NOTE — Progress Notes (Signed)
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  She reported feeling well on the 14th but upon waking on the 15th she noted that she was unable to sit or stand during a feeling of "dizziness."  It was difficult for her to differentiate between lightheadedness and vertigo type symptoms.  She had to "keep her head down" when walking around she feels like she will fall.  She also had a headache over the last few days and denied any subjective weakness or any vision changes.  Due to persistent symptoms she saw her PCP who referred her back to the ED and the admitting physician interviewed her and she had dizziness and a headache at that time and was unable to stand without falling.  Neurology was consulted and now planning for LTM EEG and have taken her off the Keppra at the as they feel that this may be contributing.  Assessment and Plan:  Vertigo, Acute  Left sided ataxia, hypoesthesia ? Chronicity.  Left sided weakness, chronic  -LKWT 2/14 evening, awoke with symptoms of vertigo 2/15 AM. Since this time unable to be upright without vertigo symptoms. On exam there is L sided weakness (chronic), but also L sided hypoesthesia and L sided ataxia, + romberg with falling to the left. Imaging pending, plan for initial MRI as she is out of window for stroke treatments.  -Discussed with Neurology Dr.Kirkpatrick, who recommends for MRI as  initial step  -MRI brain with and without contrast done and showed "No acute intracranial abnormality. Stable age advanced, nonspecific chronic cerebral white matter signal abnormality."  -Swallow screen then heart healthy diet -Serial neurochecks -C/w Tele monitoring   Recent admission with possible Syncope versus Mimic -Abnormal EEG with left hemispheric slowing and sharply contoured.  Started on Keppra 500 mg twice daily per neurology recommendation on last admission.  Concern considering absent prodrome presentation with somnolence on that admission that this prior episode may have represented a seizure -Keppra 500 mg twice daily held by Neurology -Start IVF with NS at 75 mL/hr -Getting LTM EEG Monitoring -PT/OT to evaluate and Treat -Repeat Orthostatic VS in the AM   Leukocytosis -Flu/COVID/RSV negative, check RVP with report of recent mild cough -WBC Trend: Recent Labs  Lab 12/13/23 1522 12/14/23 0539 12/15/23 0615 12/16/23 0553 12/21/23 1706 12/22/23 0317 12/23/23 0940  WBC 8.0 9.7 10.7* 11.2* 13.1* 12.7* 12.7*  -Continue to Monitor for S/Sx of infection -Repeat CBC in the AM   Chronic low-level elevation in hCG -Question pituitary hCG secretion postmenopausally -Repeat hCG outpatient    Hypokalemia -Patient's K+ Level Trend: Recent Labs  Lab 12/14/23 0200 12/15/23 0615 12/16/23 0553 12/17/23 0500 12/21/23 1706 12/22/23 0445 12/23/23 0940  K 3.8 3.2* 3.3* 3.6 3.4* 4.0 3.5  -Replete with po Kcl 40 mEQ x1 -Continue to Monitor and Replete as Necessary -Repeat CMP in the AM   CKD Stage 3a -BUN/Cr Trend: Recent Labs  Lab 12/14/23 0200 12/15/23 0615 12/16/23 0553 12/17/23 0500 12/21/23 1706  12/22/23 0445 12/23/23 0940  BUN 14 20 11 12 18  21* 20  CREATININE 1.18* 1.20* 1.06* 1.19* 1.09* 0.98 1.25*  -Start IVF with NS at 75 ML/hr -Avoid Nephrotoxic Medications, Contrast Dyes, Hypotension and Dehydration to Ensure Adequate Renal Perfusion and will need to  Renally Adjust Meds -Continue to Monitor and Trend Renal Function carefully and repeat CMP in the AM    Chronic medical problems: Medication management: Pharm med history not completed at time of admission, med rec completed based on fill hx / recent dc summary. Attention to pharm rec once completed   History of prior CVA left-sided weakness: On AC per below. Continue Statin, additional stroke eval pending above   History of PE on warfarin: Check INR, then pharmacy to dose warfarin  SLE, RA -On rituximab outpatient.   -Continue home leflunomide, Plaquenil, Prednisone 5 mg daily   Hypertension -Currently not on any antihypertensives  Hyperlipidemia -Statin per above  Mood disorder -Continue home Xanax prn  Insomnia -Hold her home Ambien and patient with fall risk  Hypoalbuminemia -Patient's Albumin Trend: Recent Labs  Lab 12/13/23 1522 12/15/23 0615 12/21/23 1706 12/23/23 0940  ALBUMIN 3.9 3.1* 3.8 3.2*  -Continue to Monitor and Trend and repeat CMP in the AM   DVT prophylaxis: Anticoagulated with Coumadin    Code Status: Full Code Family Communication: No family present at bedside  Disposition Plan:  Level of care: Telemetry Medical Status is: Inpatient Remains inpatient appropriate because: Needs further clinical improvement and clearance by Neurology   Consultants:  Neurology  Procedures:  EEG This study is within normal limits. No seizures or epileptiform discharges were seen throughout the recording.   A normal interictal EEG does not exclude the diagnosis of epilepsy.  Antimicrobials:  Anti-infectives (From admission, onward)    Start     Dose/Rate Route Frequency Ordered Stop   12/22/23 1715  hydroxychloroquine (PLAQUENIL) tablet 200 mg        200 mg Oral Daily 12/22/23 0353         Subjective: In and examined at bedside was doing okay and had just been hooked up to LTM.  No nausea or vomiting.  States that she continues to feel dizzy  intermittently but thinks her dizziness is improved today.  No nausea or vomiting.  No other concerns or complaints this time.  Objective: Vitals:   12/23/23 0323 12/23/23 0721 12/23/23 1056 12/23/23 1528  BP: 98/62 125/85 114/66 100/68  Pulse: 87 64 82 85  Resp: 18 17 17 17   Temp: 97.8 F (36.6 C) 98 F (36.7 C) 98 F (36.7 C) 98.8 F (37.1 C)  TempSrc: Oral Oral Oral Oral  SpO2: 99% 99% 99% 98%  Weight:      Height:       No intake or output data in the 24 hours ending 12/23/23 1935 Filed Weights   12/22/23 1634  Weight: 79.8 kg   Examination: Physical Exam:  Constitutional: WN/WD overweight female in no acute distress Respiratory: Diminished to auscultation bilaterally, no wheezing, rales, rhonchi or crackles. Normal respiratory effort and patient is not tachypenic. No accessory muscle use.  Unlabored breathing Cardiovascular: RRR, no murmurs / rubs / gallops. S1 and S2 auscultated. No extremity edema Abdomen: Soft, non-tender, non-distended. Bowel sounds positive.  GU: Deferred. Musculoskeletal: No clubbing / cyanosis of digits/nails. No joint deformity upper and lower extremities. Skin: No rashes, lesions, ulcers on limited skin evaluation. No induration; Warm and dry.  Neurologic: CN 2-12 grossly intact with no focal deficits.  Romberg sign and cerebellar reflexes not assessed.  Psychiatric: Normal judgment and insight. Alert and oriented x 3. Normal mood and appropriate affect.   Data Reviewed: I have personally reviewed following labs and imaging studies  CBC: Recent Labs  Lab 12/21/23 1706 12/22/23 0317 12/23/23 0940  WBC 13.1* 12.7* 12.7*  NEUTROABS 11.4*  --  8.0*  HGB 13.8 12.5 13.9  HCT 42.5 38.4 41.7  MCV 92.4 93.4 91.2  PLT 252 232 292   Basic Metabolic Panel: Recent Labs  Lab 12/17/23 0500 12/17/23 1815 12/21/23 1706 12/22/23 0445 12/23/23 0940  NA 139  --  138 137 140  K 3.6  --  3.4* 4.0 3.5  CL 108  --  105 106 106  CO2 23  --  26 23 25    GLUCOSE 101*  --  92 78 87  BUN 12  --  18 21* 20  CREATININE 1.19*  --  1.09* 0.98 1.25*  CALCIUM 8.8*  --  8.8* 8.5* 8.3*  MG  --  1.8 2.2 2.0 1.8  PHOS  --   --   --  3.8 2.9   GFR: Estimated Creatinine Clearance: 55.8 mL/min (A) (by C-G formula based on SCr of 1.25 mg/dL (H)). Liver Function Tests: Recent Labs  Lab 12/21/23 1706 12/23/23 0940  AST 25 29  ALT 29 33  ALKPHOS 77 72  BILITOT 0.3 0.5  PROT 7.2 6.1*  ALBUMIN 3.8 3.2*   No results for input(s): "LIPASE", "AMYLASE" in the last 168 hours. No results for input(s): "AMMONIA" in the last 168 hours. Coagulation Profile: Recent Labs  Lab 12/17/23 0500 12/18/23 0551 12/22/23 0317 12/23/23 0612  INR 1.9* 2.1* 2.6* 2.3*   Cardiac Enzymes: No results for input(s): "CKTOTAL", "CKMB", "CKMBINDEX", "TROPONINI" in the last 168 hours. BNP (last 3 results) No results for input(s): "PROBNP" in the last 8760 hours. HbA1C: Recent Labs    12/22/23 0317  HGBA1C 4.8   CBG: No results for input(s): "GLUCAP" in the last 168 hours. Lipid Profile: Recent Labs    12/22/23 0445  CHOL 127  HDL 41  LDLCALC 68  TRIG 90  CHOLHDL 3.1   Thyroid Function Tests: Recent Labs    12/22/23 0317  TSH 1.580   Anemia Panel: No results for input(s): "VITAMINB12", "FOLATE", "FERRITIN", "TIBC", "IRON", "RETICCTPCT" in the last 72 hours. Sepsis Labs: No results for input(s): "PROCALCITON", "LATICACIDVEN" in the last 168 hours.  Recent Results (from the past 240 hours)  Resp panel by RT-PCR (RSV, Flu A&B, Covid) Anterior Nasal Swab     Status: None   Collection Time: 12/21/23  5:08 PM   Specimen: Anterior Nasal Swab  Result Value Ref Range Status   SARS Coronavirus 2 by RT PCR NEGATIVE NEGATIVE Final    Comment: (NOTE) SARS-CoV-2 target nucleic acids are NOT DETECTED.  The SARS-CoV-2 RNA is generally detectable in upper respiratory specimens during the acute phase of infection. The lowest concentration of SARS-CoV-2 viral  copies this assay can detect is 138 copies/mL. A negative result does not preclude SARS-Cov-2 infection and should not be used as the sole basis for treatment or other patient management decisions. A negative result may occur with  improper specimen collection/handling, submission of specimen other than nasopharyngeal swab, presence of viral mutation(s) within the areas targeted by this assay, and inadequate number of viral copies(<138 copies/mL). A negative result must be combined with clinical observations, patient history, and epidemiological information. The expected result is Negative.  Fact Sheet for  Patients:  BloggerCourse.com  Fact Sheet for Healthcare Providers:  SeriousBroker.it  This test is no t yet approved or cleared by the [place] FDA and  has been authorized for detection and/or diagnosis of SARS-CoV-2 by FDA under an Emergency Use Authorization (EUA). This EUA will remain  in effect (meaning this test can be used) for the duration of the COVID-19 declaration under Section 564(b)(1) of the Act, 21 U.S.C.section 360bbb-3(b)(1), unless the authorization is terminated  or revoked sooner.       Influenza A by PCR NEGATIVE NEGATIVE Final   Influenza B by PCR NEGATIVE NEGATIVE Final    Comment: (NOTE) The Xpert Xpress SARS-CoV-2/FLU/RSV plus assay is intended as an aid in the diagnosis of influenza from Nasopharyngeal swab specimens and should not be used as a sole basis for treatment. Nasal washings and aspirates are unacceptable for Xpert Xpress SARS-CoV-2/FLU/RSV testing.  Fact Sheet for Patients: BloggerCourse.com  Fact Sheet for Healthcare Providers: SeriousBroker.it  This test is not yet approved or cleared by the [place] FDA and has been authorized for detection and/or diagnosis of SARS-CoV-2 by FDA under an Emergency Use Authorization (EUA). This  EUA will remain in effect (meaning this test can be used) for the duration of the COVID-19 declaration under Section 564(b)(1) of the Act, 21 U.S.C. section 360bbb-3(b)(1), unless the authorization is terminated or revoked.     Resp Syncytial Virus by PCR NEGATIVE NEGATIVE Final    Comment: (NOTE) Fact Sheet for Patients: BloggerCourse.com  Fact Sheet for Healthcare Providers: SeriousBroker.it  This test is not yet approved or cleared by the [place] FDA and has been authorized for detection and/or diagnosis of SARS-CoV-2 by FDA under an Emergency Use Authorization (EUA). This EUA will remain in effect (meaning this test can be used) for the duration of the COVID-19 declaration under Section 564(b)(1) of the Act, 21 U.S.C. section 360bbb-3(b)(1), unless the authorization is terminated or revoked.  Performed at [facility], [address] [place], [place] [address]   Respiratory (~20 pathogens) panel by PCR     Status: None   Collection Time: 12/22/23  6:27 AM   Specimen: Nasopharyngeal Swab; Respiratory  Result Value Ref Range Status   Adenovirus NOT DETECTED NOT DETECTED Final   Coronavirus 229E NOT DETECTED NOT DETECTED Final    Comment: (NOTE) The Coronavirus on the Respiratory Panel, DOES NOT test for the novel  Coronavirus (2019 nCoV)    Coronavirus HKU1 NOT DETECTED NOT DETECTED Final   Coronavirus NL63 NOT DETECTED NOT DETECTED Final   Coronavirus OC43 NOT DETECTED NOT DETECTED Final   Metapneumovirus NOT DETECTED NOT DETECTED Final   Rhinovirus / Enterovirus NOT DETECTED NOT DETECTED Final   Influenza A NOT DETECTED NOT DETECTED Final   Influenza B NOT DETECTED NOT DETECTED Final   Parainfluenza Virus 1 NOT DETECTED NOT DETECTED Final   Parainfluenza Virus 2 NOT DETECTED NOT DETECTED Final   Parainfluenza Virus 3 NOT DETECTED NOT DETECTED Final   Parainfluenza Virus 4 NOT DETECTED NOT  DETECTED Final   Respiratory Syncytial Virus NOT DETECTED NOT DETECTED Final   Bordetella pertussis NOT DETECTED NOT DETECTED Final   Bordetella Parapertussis NOT DETECTED NOT DETECTED Final   Chlamydophila pneumoniae NOT DETECTED NOT DETECTED Final   Mycoplasma pneumoniae NOT DETECTED NOT DETECTED Final    Comment: Performed at [facility], [address], [place], [place] [address]    Radiology Studies: EEG adult Result Date: 12/23/2023 Charlsie Quest, MD  12/23/2023  9:25 AM Patient Name: Lauren Chung MRN: [id number] Epilepsy Attending: Charlsie Quest Referring Physician/Provider: Elmer Picker, NP Date: 12/23/2023 Duration: 22.34 mins Patient history: 58 year old female with history of CVA with left-sided weakness, PE on Coumadin, recent admission with syncope and confusion. EEG to evaluate for seizure Level of alertness: Awake AEDs during EEG study: LEV Technical aspects: This EEG study was done with scalp electrodes positioned according to the 10-20 International system of electrode placement. Electrical activity was reviewed with band pass filter of 1-70Hz , sensitivity of 7 uV/mm, display speed of 55mm/sec with a 60Hz  notched filter applied as appropriate. EEG data were recorded continuously and digitally stored.  Video monitoring was available and reviewed as appropriate. Description: The posterior dominant rhythm consists of 9 Hz activity of moderate voltage (25-35 uV) seen predominantly in posterior head regions, symmetric and reactive to eye opening and eye closing. Physiologic photic driving was not seen during photic stimulation. Hyperventilation was not performed.   IMPRESSION: This study is within normal limits. No seizures or epileptiform discharges were seen throughout the recording. A normal interictal EEG does not exclude the diagnosis of epilepsy. Charlsie Quest   MR BRAIN W WO CONTRAST Result Date: 12/22/2023 CLINICAL DATA:  58 year old female with neurologic  deficits, left side weakness, sensory loss, ataxia. EXAM: MRI HEAD WITHOUT AND WITH CONTRAST TECHNIQUE: Multiplanar, multiecho pulse sequences of the brain and surrounding structures were obtained without and with intravenous contrast. CONTRAST:  8mL GADAVIST GADOBUTROL 1 MMOL/ML IV SOLN COMPARISON:  Brain MRI 12/15/2023 and earlier. FINDINGS: Brain: Cerebral volume is stable since 2023, within normal limits for age. No restricted diffusion to suggest acute infarction. No midline shift, mass effect, evidence of mass lesion, ventriculomegaly, extra-axial collection or acute intracranial hemorrhage. Cervicomedullary junction and pituitary are within normal limits. Chronically advanced and widely scattered cerebral white matter T2 and FLAIR hyperintensity does not appear significantly changed from the 2023 MRI. Pattern is nonspecific. No cortical encephalomalacia or chronic cerebral blood products are identified. Deep gray nuclei, brainstem and cerebellum remain within normal limits. No abnormal enhancement identified.  No dural thickening. Vascular: Major intracranial vascular flow voids are stable since the 2023 MRI. Following contrast the major dural venous sinuses are enhancing and appear to be patent. Skull and upper cervical spine: Stable and negative visible cervical spine. Visualized bone marrow signal is within normal limits. Sinuses/Orbits: Orbits appear stable and negative. Paranasal Visualized paranasal sinuses and mastoids are stable and well aerated. Other: Visible internal auditory structures appear normal. Chronic partially calcified scalp sebaceous type cysts. Otherwise negative visible scalp and face. IMPRESSION: 1. No acute intracranial abnormality. 2. Stable age advanced, nonspecific chronic cerebral white matter signal abnormality. Electronically Signed   By: Odessa Fleming M.D.   On: 12/22/2023 07:07   Scheduled Meds:  hydroxychloroquine  200 mg Oral Daily   predniSONE  5 mg Oral Q breakfast    rosuvastatin  10 mg Oral q AM   sodium chloride flush  3 mL Intravenous Q12H   Warfarin - Pharmacist Dosing Inpatient   Does not apply q1600   Continuous Infusions:  sodium chloride      LOS: 1 day   Marguerita Merles, DO [facility] Available via Epic secure chat 7am-7pm After these hours, please refer to coverage provider listed on [email or web address] 12/23/2023, 7:35 PM

## 2023-12-24 ENCOUNTER — Encounter (HOSPITAL_COMMUNITY): Payer: 59

## 2023-12-24 DIAGNOSIS — R2990 Unspecified symptoms and signs involving the nervous system: Secondary | ICD-10-CM | POA: Diagnosis not present

## 2023-12-24 DIAGNOSIS — R42 Dizziness and giddiness: Secondary | ICD-10-CM | POA: Diagnosis not present

## 2023-12-24 DIAGNOSIS — R569 Unspecified convulsions: Secondary | ICD-10-CM | POA: Diagnosis not present

## 2023-12-24 LAB — COMPREHENSIVE METABOLIC PANEL
ALT: 31 U/L (ref 0–44)
AST: 24 U/L (ref 15–41)
Albumin: 3.2 g/dL — ABNORMAL LOW (ref 3.5–5.0)
Alkaline Phosphatase: 72 U/L (ref 38–126)
Anion gap: 8 (ref 5–15)
BUN: 15 mg/dL (ref 6–20)
CO2: 25 mmol/L (ref 22–32)
Calcium: 8.6 mg/dL — ABNORMAL LOW (ref 8.9–10.3)
Chloride: 107 mmol/L (ref 98–111)
Creatinine, Ser: 1.19 mg/dL — ABNORMAL HIGH (ref 0.44–1.00)
GFR, Estimated: 54 mL/min — ABNORMAL LOW (ref >60)
Glucose, Bld: 82 mg/dL (ref 70–99)
Potassium: 3.5 mmol/L (ref 3.5–5.1)
Sodium: 140 mmol/L (ref 135–145)
Total Bilirubin: 0.6 mg/dL (ref 0.0–1.2)
Total Protein: 5.9 g/dL — ABNORMAL LOW (ref 6.5–8.1)

## 2023-12-24 LAB — CBC WITH DIFFERENTIAL/PLATELET
Abs Immature Granulocytes: 0.09 10*3/uL — ABNORMAL HIGH (ref 0.00–0.07)
Basophils Absolute: 0.1 10*3/uL (ref 0.0–0.1)
Basophils Relative: 1 %
Eosinophils Absolute: 0.3 10*3/uL (ref 0.0–0.5)
Eosinophils Relative: 3 %
HCT: 40.3 % (ref 36.0–46.0)
Hemoglobin: 13.2 g/dL (ref 12.0–15.0)
Immature Granulocytes: 1 %
Lymphocytes Relative: 25 %
Lymphs Abs: 2.9 10*3/uL (ref 0.7–4.0)
MCH: 29.7 pg (ref 26.0–34.0)
MCHC: 32.8 g/dL (ref 30.0–36.0)
MCV: 90.8 fL (ref 80.0–100.0)
Monocytes Absolute: 1.2 10*3/uL — ABNORMAL HIGH (ref 0.1–1.0)
Monocytes Relative: 10 %
Neutro Abs: 6.7 10*3/uL (ref 1.7–7.7)
Neutrophils Relative %: 60 %
Platelets: 267 10*3/uL (ref 150–400)
RBC: 4.44 MIL/uL (ref 3.87–5.11)
RDW: 13.7 % (ref 11.5–15.5)
WBC: 11.2 10*3/uL — ABNORMAL HIGH (ref 4.0–10.5)
nRBC: 0 % (ref 0.0–0.2)

## 2023-12-24 LAB — MAGNESIUM: Magnesium: 1.7 mg/dL (ref 1.7–2.4)

## 2023-12-24 LAB — PROTIME-INR
INR: 2.1 — ABNORMAL HIGH (ref 0.8–1.2)
Prothrombin Time: 24 s — ABNORMAL HIGH (ref 11.4–15.2)

## 2023-12-24 LAB — PHOSPHORUS: Phosphorus: 3.6 mg/dL (ref 2.5–4.6)

## 2023-12-24 MED ORDER — POTASSIUM CHLORIDE CRYS ER 20 MEQ PO TBCR
40.0000 meq | EXTENDED_RELEASE_TABLET | Freq: Once | ORAL | Status: AC
  Administered 2023-12-24: 40 meq via ORAL
  Filled 2023-12-24: qty 2

## 2023-12-24 MED ORDER — MAGNESIUM SULFATE 2 GM/50ML IV SOLN
2.0000 g | Freq: Once | INTRAVENOUS | Status: AC
  Administered 2023-12-24: 2 g via INTRAVENOUS
  Filled 2023-12-24: qty 50

## 2023-12-24 MED ORDER — MECLIZINE HCL 25 MG PO TABS: 25.0000 mg | ORAL_TABLET | Freq: Three times a day (TID) | ORAL | 0 refills | Status: DC | PRN

## 2023-12-24 NOTE — Progress Notes (Signed)
LTM EEG D/C'd. No skin break down noted. [facility] notified.

## 2023-12-24 NOTE — Consult Note (Signed)
[facility] Liaison Consult Note   12/24/2023  Lauren Chung 01-02-1966 [id number]  Insurance: [employer] Dual Complete   Primary Care Provider: Tally Joe, MD with Deboraha Sprang at  this provider is listed for the transition of care follow up appointments  and [facility] Team calls   [facility] Liaison met patient at bedside at [facility]. Patient endorses PCP and very accepting. Explained reason for visit and for checking post hospital needs. Patient denies current needs but accepting of follow up calls.    The patient was screened for 7 day readmission hospitalization with noted medium risk score for unplanned readmission risk 2 hospital admissions in 6 months.  The patient was assessed for potential Community Care Coordination service needs for post hospital transition for care coordination. Review of patient's electronic medical record reveals patient is forgetting medications at times,.   Plan: [facility] Liaison will continue to follow progress and disposition to asess for post hospital community care coordination/management needs.  Referral request for community care coordination: Anticipate [facility] TOC follow up calls.    [facility] Community Care, Population Health does not replace or interfere with any arrangements made by the Inpatient Transition of Care team.   For questions contact:   Charlesetta Shanks, RN, BSN, CCM [place]  [facility] Liaison Direct Dial: [phone] or secure chat Email: [email or web address]

## 2023-12-24 NOTE — Progress Notes (Signed)
PT Cancellation Note  Patient Details Name: Lauren Chung MRN: [id number] DOB: Jun 11, 1966   Cancelled Treatment:    Reason Eval/Treat Not Completed: Patient at procedure or test/unavailable  Patient remains attached to EEG. Will defer until after EEG removed to allow for stair training.    Jerolyn Center, PT Acute Rehabilitation Services  Office [phone]  Zena Amos 12/24/2023, 10:41 AM

## 2023-12-24 NOTE — Progress Notes (Signed)
Physical Therapy Treatment Patient Details Name: Lauren Chung MRN: [id number] DOB: 31-Jul-1966 Today's Date: 12/24/2023   History of Present Illness Pt is 58 yo female presented on 12/22/23 with dizziness and fatigue with ADLs after recent admission 2/9-2/14/25 following syncopal episode. Pt noted epsiodes of significant dizziness and activity intolerance after returning home. Pt f/u wiht PCP who referred pt back to ED. Pt with hx including but not limited to  lupus, seronegative rheumatoid arthritis, HTN, HLD, anxiety and depression, CKD stage IIIa, PTSD, history of CVA.    PT Comments  Patient ambulated 90 feet and denied dizziness. Completed 5 steps and pt reported mild dizziness that would go away with looking down. Completed 5 more steps with supervision and returned 90 ft to her room. Making progress towards goals.     If plan is discharge home, recommend the following: A little help with walking and/or transfers;A little help with bathing/dressing/bathroom;Assistance with cooking/housework;Help with stairs or ramp for entrance   Can travel by private vehicle     Yes  Equipment Recommendations  Rolling walker (2 wheels)    Recommendations for Other Services       Precautions / Restrictions Precautions Precautions: Fall Precaution/Restrictions Comments: syncope. balance loss when standing, dizziness Restrictions Weight Bearing Restrictions Per Provider Order: No     Mobility  Bed Mobility Overal bed mobility: Independent Bed Mobility: Supine to Sit, Sit to Supine     Supine to sit: Independent Sit to supine: Independent   General bed mobility comments: denies dizziness with changes in position    Transfers Overall transfer level: Needs assistance Equipment used: None Transfers: Sit to/from Stand Sit to Stand: Supervision           General transfer comment: denied dizziness    Ambulation/Gait Ambulation/Gait assistance: Contact guard assist, Min assist Gait  Distance (Feet): 180 Feet Assistive device: None Gait Pattern/deviations: Decreased stride length, Decreased step length - right, Decreased step length - left, Step-through pattern Gait velocity: dec     General Gait Details: decreased cadence   Stairs   Stairs assistance: Contact guard assist Stair Management: One rail Right, Alternating pattern, Forwards Number of Stairs: 10 General stair comments: after 5 steps began to feel dizziness; no imbalance   Wheelchair Mobility     Tilt Bed    Modified Rankin (Stroke Patients Only)       Balance Overall balance assessment: Needs assistance Sitting-balance support: Feet supported Sitting balance-Leahy Scale: Normal Sitting balance - Comments: requires time in sitting to acommodate to posture change   Standing balance support: No upper extremity supported Standing balance-Leahy Scale: Fair                              Hotel manager: No apparent difficulties  Cognition Arousal: Alert Behavior During Therapy: WFL for tasks assessed/performed   PT - Cognitive impairments: No apparent impairments                         Following commands: Intact      Cueing Cueing Techniques: Verbal cues  Exercises      General Comments        Pertinent Vitals/Pain Pain Assessment Pain Assessment: No/denies pain    Home Living Family/patient expects to be discharged to:: Private residence Living Arrangements: Children;Other relatives (and grandchildren) Available Help at Discharge: Family;Available 24 hours/day Type of Home: House Home Access: Level entry  Alternate Level Stairs-Number of Steps: flight with landing Home Layout: Two level Home Equipment: None Additional Comments: pt has autistic  son for whom she  cares for, also 2 grandchildren and another daughter in the home    Prior Function            PT Goals (current goals can now be found in the care plan  section) Acute Rehab PT Goals Patient Stated Goal: improve activity tolerance Time For Goal Achievement: 01/05/24 Potential to Achieve Goals: Good Progress towards PT goals: Progressing toward goals    Frequency    Min 1X/week      PT Plan      Co-evaluation              AM-PAC PT "6 Clicks" Mobility   Outcome Measure  Help needed turning from your back to your side while in a flat bed without using bedrails?: None Help needed moving from lying on your back to sitting on the side of a flat bed without using bedrails?: None Help needed moving to and from a bed to a chair (including a wheelchair)?: A Little Help needed standing up from a chair using your arms (e.g., wheelchair or bedside chair)?: A Little Help needed to walk in hospital room?: A Little Help needed climbing 3-5 steps with a railing? : A Little 6 Click Score: 20    End of Session   Activity Tolerance: Patient tolerated treatment well Patient left: with call bell/phone within reach;in bed Nurse Communication: Mobility status PT Visit Diagnosis: Unsteadiness on feet (R26.81);Difficulty in walking, not elsewhere classified (R26.2);Dizziness and giddiness (R42)     Time: 1610-9604 PT Time Calculation (min) (ACUTE ONLY): 9 min  Charges:    $Gait Training: 8-22 mins PT General Charges $$ ACUTE PT VISIT: 1 Visit                      Jerolyn Center, PT Acute Rehabilitation Services  Office [phone]    Zena Amos 12/24/2023, 1:26 PM

## 2023-12-24 NOTE — Discharge Summary (Signed)
Physician Discharge Summary   Patient: Lauren Chung MRN: [id number] DOB: 10-16-1966  Admit date:     12/21/2023  Discharge date: 12/24/23  Discharge Physician: Marguerita Merles, DO   PCP: Tally Joe, MD   Recommendations at discharge:   Follow-up with PCP within 1 to 2 weeks and repeat CBC, CMP, mag, Phos within 1 week Follow-up with Neurology Dr. Everlena Cooper in outpatient setting within 1 to 2 weeks  Discharge Diagnoses: Principal Problem:   Vertigo Active Problems:   Stroke-like symptoms  Resolved Problems:   * No resolved hospital problems. *  Hospital Course: Patient is a 58 year old Hispanic female with a past medical history significant for Mann[place] to CVA with left-sided weakness, history of PE on anticoagulation with warfarin, SLE, rheumatoid arthritis, hypertension, hyperlipidemia, CKD stage IIIa, history of mood disorder as well as other comorbidities who had a recent admission on 12/13/2023 until 12/18/2023 for possible syncopal episode versus mimic with left-sided EEG abnormality and was started on Keppra who was referred to her PCP due to vertigo.  She reported feeling well on the 14th but upon waking on the 15th she noted that she was unable to sit or stand during a feeling of "dizziness."  It was difficult for her to differentiate between lightheadedness and vertigo type symptoms.  She had to "keep her head down" when walking around she feels like she will fall.  She also had a headache over the last few days and denied any subjective weakness or any vision changes.  Due to persistent symptoms she saw her PCP who referred her back to the ED and the admitting physician interviewed her and she had dizziness and a headache at that time and was unable to stand without falling.  Neurology was consulted and now planning for LTM EEG and have taken her off the Keppra at the as they feel that this may be contributing.  She is medically stable for discharge at this time and neurology cleared the  patient and she did well with therapy.  She will follow-up with PCP and neurology outpatient setting within 1 to 2 weeks.  Assessment and Plan:  Vertigo, Acute  Left sided ataxia, hypoesthesia ? Chronicity.  Left sided weakness, chronic  -LKWT 2/14 evening, awoke with symptoms of vertigo 2/15 AM. Since this time unable to be upright without vertigo symptoms. On exam there is L sided weakness (chronic), but also L sided hypoesthesia and L sided ataxia, + romberg with falling to the left. Imaging pending, plan for initial MRI as she is out of window for stroke treatments.  -Discussed with Neurology Dr.Kirkpatrick, who recommends for MRI as initial step  -MRI brain with and without contrast done and showed "No acute intracranial abnormality. Stable age advanced, nonspecific chronic cerebral white matter signal abnormality."  -Swallow screen then heart healthy diet -Serial neurochecks -C/w Tele monitoring and neurology recommended discontinuing Keppra and LTM EEG which was done and she is stable for discharge from a neurological perspective and PT OT recommended home health -She will need to follow-up with [place] at neurology Dr. Everlena Cooper within 1 to 2 weeks and will continue meclizine at discharge   Recent admission with possible Syncope versus Mimic -Abnormal EEG with left hemispheric slowing and sharply contoured.  Started on Keppra 500 mg twice daily per neurology recommendation on last admission.  Concern considering absent prodrome presentation with somnolence on that admission that this prior episode may have represented a seizure -Keppra 500 mg twice daily held by Neurology and they recommended discontinuing  it -Start IVF with NS at 75 mL/hr while hospitalized and this is now stopped at the time of discharge -Getting LTM EEG Monitoring and this showed a large study that was within normal limits with no seizures or epileptiform discharges are seen throughout the recording and normal interictal EEG  does not exclude the diagnosis of epilepsy -PT/OT to evaluate and Treat and recommending home health -Repeat Orthostatic VS and she is not orthostatic -Neurology cleared the patient and felt that her dizziness was attributed to Keppra   Leukocytosis improving -Flu/COVID/RSV negative, check RVP with report of recent mild cough -WBC Trend: Recent Labs  Lab 12/14/23 0539 12/15/23 0615 12/16/23 0553 12/21/23 1706 12/22/23 0317 12/23/23 0940 12/24/23 0912  WBC 9.7 10.7* 11.2* 13.1* 12.7* 12.7* 11.2*  -Continue to Monitor for S/Sx of infection; has been on steroid taper prior to discharge and leukocytosis likely in setting of steroid to margination -Repeat CBC within 1 week   Chronic low-level elevation in hCG -Question pituitary hCG secretion postmenopausally -Repeat hCG outpatient    Hypokalemia -Patient's K+ Level Trend: Recent Labs  Lab 12/15/23 0615 12/16/23 0553 12/17/23 0500 12/21/23 1706 12/22/23 0445 12/23/23 0940 12/24/23 0912  K 3.2* 3.3* 3.6 3.4* 4.0 3.5 3.5  -Replete with po Kcl 40 mEQ x1 prior to discharge -Continue to Monitor and Replete as Necessary -Repeat CMP within 1 week  CKD Stage 3a -BUN/Cr Trend: Recent Labs  Lab 12/15/23 0615 12/16/23 0553 12/17/23 0500 12/21/23 1706 12/22/23 0445 12/23/23 0940 12/24/23 0912  BUN 20 11 12 18  21* 20 15  CREATININE 1.20* 1.06* 1.19* 1.09* 0.98 1.25* 1.19*  -Start IVF with NS at 75 ML/hr -Avoid Nephrotoxic Medications, Contrast Dyes, Hypotension and Dehydration to Ensure Adequate Renal Perfusion and will need to Renally Adjust Meds -Continue to Monitor and Trend Renal Function carefully and repeat CMP within 1 week   Chronic medical problems: Medication management: Pharm med history not completed at time of admission, med rec completed based on fill hx / recent dc summary. Attention to pharm rec once completed   History of prior CVA left-sided weakness: On AC per below. Continue Statin, additional stroke  eval pending above   History of PE on warfarin: Check INR, then pharmacy to dose warfarin continue at discharge  SLE, RA -On rituximab outpatient.   -Continue home  Plaquenil, Prednisone 5 mg daily and apparently no longer taking leflunomide  Hypertension -Currently not on any antihypertensives  Hyperlipidemia -Statin per above  Mood disorder -Continue home Xanax prn  Insomnia -Hold her home Ambien and patient with fall risk  Hypoalbuminemia -Patient's Albumin Trend: Recent Labs  Lab 12/13/23 1522 12/15/23 0615 12/21/23 1706 12/23/23 0940 12/24/23 0912  ALBUMIN 3.9 3.1* 3.8 3.2* 3.2*  -Continue to Monitor and Trend and repeat CMP in the AM  Consultants: Neurology Procedures performed: LTM EEG Disposition: Home Diet recommendation:  Cardiac diet DISCHARGE MEDICATION: Allergies as of 12/24/2023       Reactions   Acetaminophen Nausea And Vomiting   Belsomra [suvorexant] Shortness Of Breath, Rash   Codeine Shortness Of Breath   Iodinated Contrast Media Shortness Of Breath        Medication List     STOP taking these medications    leflunomide 10 MG tablet Commonly known as: ARAVA   levETIRAcetam 500 MG tablet Commonly known as: KEPPRA       TAKE these medications    alendronate 70 MG tablet Commonly known as: FOSAMAX Take 70 mg by mouth once a week.  ALPRAZolam 1 MG tablet Commonly known as: XANAX 1 qam  2 qhs What changed:  how much to take how to take this when to take this additional instructions   docusate sodium 100 MG capsule Commonly known as: COLACE Take 1 capsule (100 mg total) by mouth 2 (two) times daily.   hydroxychloroquine 200 MG tablet Commonly known as: PLAQUENIL Take 200 mg by mouth daily.   meclizine 25 MG tablet Commonly known as: ANTIVERT Take 1 tablet (25 mg total) by mouth 3 (three) times daily as needed for dizziness.   predniSONE 5 MG tablet Commonly known as: DELTASONE Take 1 tablet (5 mg total) by mouth  daily with breakfast.   predniSONE 10 MG tablet Commonly known as: DELTASONE Prednisone 40 mg po daily x 2 day then Prednisone 30 mg po daily x 2 day then Prednisone 20 mg po daily x 2 day then Prednisone 10 mg daily x 2 day then start taking prednisone 5 mg daily till you  see your rheumatologist   rosuvastatin 10 MG tablet Commonly known as: CRESTOR Take 10 mg by mouth in the morning.   warfarin 4 MG tablet Commonly known as: COUMADIN Take 4 mg by mouth daily.   zolpidem 10 MG tablet Commonly known as: AMBIEN Take 1 tablet (10 mg total) by mouth at bedtime as needed for sleep.               Durable Medical Equipment  (From admission, onward)           Start     Ordered   12/23/23 1159  For home use only DME Tub bench  Once        12/23/23 1158            Follow-up Information     [facility] Follow up.   Specialty: Home Health Services Why: Frances Furbish will contact you for the first home visit. Contact information: [address] [place] [address] [phone]         Drema Dallas, DO Follow up.   Specialty: Neurology Why: Follow up for hospital stay Contact information: [address] [place] [address] [phone]                Discharge Exam: [facility]   12/22/23 1634  Weight: 79.8 kg   Vitals:   12/24/23 0852 12/24/23 1052  BP: (!) 140/82 (!) 149/77  Pulse: 78 81  Resp: 17 17  Temp: 98.1 F (36.7 C) 97.8 F (36.6 C)  SpO2: 98% 93%   Examination: Physical Exam:  Constitutional: WN/WD overweight Hispanic female in no acute distress appears calm Respiratory: Diminished to auscultation bilaterally, no wheezing, rales, rhonchi or crackles. Normal respiratory effort and patient is not tachypenic. No accessory muscle use.  Unlabored breathing Cardiovascular: RRR, no murmurs / rubs / gallops. S1 and S2 auscultated. No extremity edema. Abdomen: Soft, non-tender, slightly  distended secondary to body habitus.  Bowel sounds positive.  GU: Deferred. Musculoskeletal: No clubbing / cyanosis of digits/nails. No joint deformity upper and lower extremities.  Skin: No rashes, lesions, ulcers on limited skin evaluation. No induration; Warm and dry.  Neurologic: CN 2-12 grossly intact with no focal deficits. Romberg sign and cerebellar reflexes not assessed.  Psychiatric: Normal judgment and insight. Alert and oriented x 3. Normal mood and appropriate affect.   Condition at discharge: stable  The results of significant diagnostics from this hospitalization (including imaging, microbiology, ancillary and  laboratory) are listed below for reference.   Imaging Studies: Overnight EEG with video Result Date: 12/24/2023 Charlsie Quest, MD     12/24/2023  4:00 PM Patient Name: Lauren Chung MRN: [id number] Epilepsy Attending: Charlsie Quest Referring Physician/Provider: Marjorie Smolder, NP Duration: 12/23/2023 1030 to 12/24/2023 1043  Patient history: 58 year old female with history of CVA with left-sided weakness, PE on Coumadin, recent admission with syncope and confusion. EEG to evaluate for seizure  Level of alertness: Awake, asleep  AEDs during EEG study: None  Technical aspects: This EEG study was done with scalp electrodes positioned according to the 10-20 International system of electrode placement. Electrical activity was reviewed with band pass filter of 1-70Hz , sensitivity of 7 uV/mm, display speed of 1mm/sec with a 60Hz  notched filter applied as appropriate. EEG data were recorded continuously and digitally stored.  Video monitoring was available and reviewed as appropriate.  Description: The posterior dominant rhythm consists of 9 Hz activity of moderate voltage (25-35 uV) seen predominantly in posterior head regions, symmetric and reactive to eye opening and eye closing. Sleep was characterized by vertex waves, sleep spindles (12 to 14 Hz), maximal frontocentral  region. Hyperventilation and photic stimulation were not performed.    IMPRESSION: This study is within normal limits. No seizures or epileptiform discharges were seen throughout the recording.  A normal interictal EEG does not exclude the diagnosis of epilepsy.  Charlsie Quest   EEG adult Result Date: 12/23/2023 Charlsie Quest, MD     12/23/2023  9:25 AM Patient Name: Lauren Chung MRN: [id number] Epilepsy Attending: Charlsie Quest Referring Physician/Provider: Elmer Picker, NP Date: 12/23/2023 Duration: 22.34 mins Patient history: 58 year old female with history of CVA with left-sided weakness, PE on Coumadin, recent admission with syncope and confusion. EEG to evaluate for seizure Level of alertness: Awake AEDs during EEG study: LEV Technical aspects: This EEG study was done with scalp electrodes positioned according to the 10-20 International system of electrode placement. Electrical activity was reviewed with band pass filter of 1-70Hz , sensitivity of 7 uV/mm, display speed of 57mm/sec with a 60Hz  notched filter applied as appropriate. EEG data were recorded continuously and digitally stored.  Video monitoring was available and reviewed as appropriate. Description: The posterior dominant rhythm consists of 9 Hz activity of moderate voltage (25-35 uV) seen predominantly in posterior head regions, symmetric and reactive to eye opening and eye closing. Physiologic photic driving was not seen during photic stimulation. Hyperventilation was not performed.   IMPRESSION: This study is within normal limits. No seizures or epileptiform discharges were seen throughout the recording. A normal interictal EEG does not exclude the diagnosis of epilepsy. Charlsie Quest   MR BRAIN W WO CONTRAST Result Date: 12/22/2023 CLINICAL DATA:  58 year old female with neurologic deficits, left side weakness, sensory loss, ataxia. EXAM: MRI HEAD WITHOUT AND WITH CONTRAST TECHNIQUE: Multiplanar, multiecho pulse sequences of  the brain and surrounding structures were obtained without and with intravenous contrast. CONTRAST:  8mL GADAVIST GADOBUTROL 1 MMOL/ML IV SOLN COMPARISON:  Brain MRI 12/15/2023 and earlier. FINDINGS: Brain: Cerebral volume is stable since 2023, within normal limits for age. No restricted diffusion to suggest acute infarction. No midline shift, mass effect, evidence of mass lesion, ventriculomegaly, extra-axial collection or acute intracranial hemorrhage. Cervicomedullary junction and pituitary are within normal limits. Chronically advanced and widely scattered cerebral white matter T2 and FLAIR hyperintensity does not appear significantly changed from the 2023 MRI. Pattern is nonspecific. No cortical encephalomalacia or chronic cerebral blood  products are identified. Deep gray nuclei, brainstem and cerebellum remain within normal limits. No abnormal enhancement identified.  No dural thickening. Vascular: Major intracranial vascular flow voids are stable since the 2023 MRI. Following contrast the major dural venous sinuses are enhancing and appear to be patent. Skull and upper cervical spine: Stable and negative visible cervical spine. Visualized bone marrow signal is within normal limits. Sinuses/Orbits: Orbits appear stable and negative. Paranasal Visualized paranasal sinuses and mastoids are stable and well aerated. Other: Visible internal auditory structures appear normal. Chronic partially calcified scalp sebaceous type cysts. Otherwise negative visible scalp and face. IMPRESSION: 1. No acute intracranial abnormality. 2. Stable age advanced, nonspecific chronic cerebral white matter signal abnormality. Electronically Signed   By: Odessa Fleming M.D.   On: 12/22/2023 07:07   DG Chest Portable 1 View Result Date: 12/21/2023 CLINICAL DATA:  Dizziness and syncope. EXAM: PORTABLE CHEST 1 VIEW COMPARISON:  December 13, 2023 FINDINGS: The heart size and mediastinal contours are within normal limits. Both lungs are clear.  The visualized skeletal structures are unremarkable. IMPRESSION: No active disease. Electronically Signed   By: Aram Candela M.D.   On: 12/21/2023 19:47   EEG adult Result Date: 12/17/2023 Charlsie Quest, MD     12/18/2023  9:09 AM Patient Name: Lauren Chung MRN: [id number] Epilepsy Attending: Charlsie Quest Referring Physician/Provider: Meredeth Ide, MD Date: 12/17/2023 Duration: 26.11 mins Patient history: 57yo f with ams. EEG to evaluate for seizure Level of alertness: Awake AEDs during EEG study: None Technical aspects: This EEG study was done with scalp electrodes positioned according to the 10-20 International system of electrode placement. Electrical activity was reviewed with band pass filter of 1-70Hz , sensitivity of 7 uV/mm, display speed of 65mm/sec with a 60Hz  notched filter applied as appropriate. EEG data were recorded continuously and digitally stored.  Video monitoring was available and reviewed as appropriate. Description: The posterior dominant rhythm consists of 9-10 Hz activity of moderate voltage (25-35 uV) seen predominantly in posterior head regions, symmetric and reactive to eye opening and eye closing. Drowsiness was characterized by attenuation of the posterior background rhythm. Sleep was characterized by vertex waves, sleep spindles (12 to 14 Hz), maximal frontocentral region. EEG showed intermittent generalized polymorphic sharply contoured 3 to 6 Hz theta-delta slowing in left hemisphere. Hyperventilation and photic stimulation were not performed.   ABNORMALITY - Intermittent slow, left hemisphere IMPRESSION: This study is suggestive of cortical dysfunction arising from left hemisphere. No seizures seen throughout the recording. Of note, the slowing in left hemisphere appears sharply contoured. If suspicion for interictal activity remains a concern, a prolonged study including sleep should be considered. Charlsie Quest   MR BRAIN W WO CONTRAST Result Date:  12/15/2023 CLINICAL DATA:  Headache, neuro deficit Headache, increasing frequency or severity EXAM: MRI HEAD WITHOUT AND WITH CONTRAST TECHNIQUE: Multiplanar, multiecho pulse sequences of the brain and surrounding structures were obtained without and with intravenous contrast. CONTRAST:  8mL GADAVIST GADOBUTROL 1 MMOL/ML IV SOLN COMPARISON:  MRI head 05/27/2022. FINDINGS: Brain: No acute infarction, hemorrhage, hydrocephalus, extra-axial collection or mass lesion. Moderate T2/FLAIR hyperintensity white matter. No pathologic enhancement. Vascular: Major arterial flow voids are maintained at the skull base. Skull and upper cervical spine: Normal marrow signal. Sinuses/Orbits: Largely clear sinuses.  No acute orbital findings. Other: No mastoid effusions. IMPRESSION: 1. No evidence of acute intracranial abnormality. 2. Moderate T2/FLAIR hyperintensity white matter, which are nonspecific but most commonly secondary to chronic microvascular ischemic change. Chronic demyelination is a  differential consideration. Electronically Signed   By: Feliberto Harts M.D.   On: 12/15/2023 23:24   DG Chest Portable 1 View Result Date: 12/13/2023 CLINICAL DATA:  Increasing weakness over the past 2 months. EXAM: PORTABLE CHEST 1 VIEW COMPARISON:  Chest x-ray dated November 12, 2018. FINDINGS: The heart size and mediastinal contours are within normal limits. Both lungs are clear. The visualized skeletal structures are unremarkable. IMPRESSION: No active disease. Electronically Signed   By: Obie Dredge M.D.   On: 12/13/2023 16:46   CT Head Wo Contrast Result Date: 12/13/2023 CLINICAL DATA:  Altered mental status. EXAM: CT HEAD WITHOUT CONTRAST TECHNIQUE: Contiguous axial images were obtained from the base of the skull through the vertex without intravenous contrast. RADIATION DOSE REDUCTION: This exam was performed according to the departmental dose-optimization program which includes automated exposure control, adjustment of the  mA and/or kV according to patient size and/or use of iterative reconstruction technique. COMPARISON:  Head CT dated 05/27/2022. FINDINGS: Brain: The ventricles and sulci appropriate size for patient's age. Mild periventricular and deep white matter chronic microvascular ischemic changes noted. There is no acute intracranial hemorrhage. No mass effect or midline shift no extra-axial fluid collection. A 1 cm probable calcified meningioma along the right parietal convexity. Vascular: No hyperdense vessel or unexpected calcification. Skull: Normal. Negative for fracture or focal lesion. Sinuses/Orbits: Mild mucoperiosteal thickening of paranasal sinuses. No air-fluid level. The mastoid air cells are clear. Other: None IMPRESSION: 1. No acute intracranial pathology. 2. Mild chronic microvascular ischemic changes. Electronically Signed   By: Elgie Collard M.D.   On: 12/13/2023 14:30    Microbiology: Results for orders placed or performed during the hospital encounter of 12/21/23  Resp panel by RT-PCR (RSV, Flu A&B, Covid) Anterior Nasal Swab     Status: None   Collection Time: 12/21/23  5:08 PM   Specimen: Anterior Nasal Swab  Result Value Ref Range Status   SARS Coronavirus 2 by RT PCR NEGATIVE NEGATIVE Final    Comment: (NOTE) SARS-CoV-2 target nucleic acids are NOT DETECTED.  The SARS-CoV-2 RNA is generally detectable in upper respiratory specimens during the acute phase of infection. The lowest concentration of SARS-CoV-2 viral copies this assay can detect is 138 copies/mL. A negative result does not preclude SARS-Cov-2 infection and should not be used as the sole basis for treatment or other patient management decisions. A negative result may occur with  improper specimen collection/handling, submission of specimen other than nasopharyngeal swab, presence of viral mutation(s) within the areas targeted by this assay, and inadequate number of viral copies(<138 copies/mL). A negative result must  be combined with clinical observations, patient history, and epidemiological information. The expected result is Negative.  Fact Sheet for Patients:  BloggerCourse.com  Fact Sheet for Healthcare Providers:  SeriousBroker.it  This test is no t yet approved or cleared by the [place] FDA and  has been authorized for detection and/or diagnosis of SARS-CoV-2 by FDA under an Emergency Use Authorization (EUA). This EUA will remain  in effect (meaning this test can be used) for the duration of the COVID-19 declaration under Section 564(b)(1) of the Act, 21 U.S.C.section 360bbb-3(b)(1), unless the authorization is terminated  or revoked sooner.       Influenza A by PCR NEGATIVE NEGATIVE Final   Influenza B by PCR NEGATIVE NEGATIVE Final    Comment: (NOTE) The Xpert Xpress SARS-CoV-2/FLU/RSV plus assay is intended as an aid in the diagnosis of influenza from Nasopharyngeal swab specimens and should not be  used as a sole basis for treatment. Nasal washings and aspirates are unacceptable for Xpert Xpress SARS-CoV-2/FLU/RSV testing.  Fact Sheet for Patients: BloggerCourse.com  Fact Sheet for Healthcare Providers: SeriousBroker.it  This test is not yet approved or cleared by the [place] FDA and has been authorized for detection and/or diagnosis of SARS-CoV-2 by FDA under an Emergency Use Authorization (EUA). This EUA will remain in effect (meaning this test can be used) for the duration of the COVID-19 declaration under Section 564(b)(1) of the Act, 21 U.S.C. section 360bbb-3(b)(1), unless the authorization is terminated or revoked.     Resp Syncytial Virus by PCR NEGATIVE NEGATIVE Final    Comment: (NOTE) Fact Sheet for Patients: BloggerCourse.com  Fact Sheet for Healthcare Providers: SeriousBroker.it  This test is not  yet approved or cleared by the [place] FDA and has been authorized for detection and/or diagnosis of SARS-CoV-2 by FDA under an Emergency Use Authorization (EUA). This EUA will remain in effect (meaning this test can be used) for the duration of the COVID-19 declaration under Section 564(b)(1) of the Act, 21 U.S.C. section 360bbb-3(b)(1), unless the authorization is terminated or revoked.  Performed at [facility], [address] [place], [place] [address]   Respiratory (~20 pathogens) panel by PCR     Status: None   Collection Time: 12/22/23  6:27 AM   Specimen: Nasopharyngeal Swab; Respiratory  Result Value Ref Range Status   Adenovirus NOT DETECTED NOT DETECTED Final   Coronavirus 229E NOT DETECTED NOT DETECTED Final    Comment: (NOTE) The Coronavirus on the Respiratory Panel, DOES NOT test for the novel  Coronavirus (2019 nCoV)    Coronavirus HKU1 NOT DETECTED NOT DETECTED Final   Coronavirus NL63 NOT DETECTED NOT DETECTED Final   Coronavirus OC43 NOT DETECTED NOT DETECTED Final   Metapneumovirus NOT DETECTED NOT DETECTED Final   Rhinovirus / Enterovirus NOT DETECTED NOT DETECTED Final   Influenza A NOT DETECTED NOT DETECTED Final   Influenza B NOT DETECTED NOT DETECTED Final   Parainfluenza Virus 1 NOT DETECTED NOT DETECTED Final   Parainfluenza Virus 2 NOT DETECTED NOT DETECTED Final   Parainfluenza Virus 3 NOT DETECTED NOT DETECTED Final   Parainfluenza Virus 4 NOT DETECTED NOT DETECTED Final   Respiratory Syncytial Virus NOT DETECTED NOT DETECTED Final   Bordetella pertussis NOT DETECTED NOT DETECTED Final   Bordetella Parapertussis NOT DETECTED NOT DETECTED Final   Chlamydophila pneumoniae NOT DETECTED NOT DETECTED Final   Mycoplasma pneumoniae NOT DETECTED NOT DETECTED Final    Comment: Performed at [facility], [address], [place], [place] [address]   Labs: CBC: Recent Labs  Lab 12/21/23 1706 12/22/23 0317 12/23/23 0940  12/24/23 0912  WBC 13.1* 12.7* 12.7* 11.2*  NEUTROABS 11.4*  --  8.0* 6.7  HGB 13.8 12.5 13.9 13.2  HCT 42.5 38.4 41.7 40.3  MCV 92.4 93.4 91.2 90.8  PLT 252 232 292 267   Basic Metabolic Panel: Recent Labs  Lab 12/21/23 1706 12/22/23 0445 12/23/23 0940 12/24/23 0912  NA 138 137 140 140  K 3.4* 4.0 3.5 3.5  CL 105 106 106 107  CO2 26 23 25 25   GLUCOSE 92 78 87 82  BUN 18 21* 20 15  CREATININE 1.09* 0.98 1.25* 1.19*  CALCIUM 8.8* 8.5* 8.3* 8.6*  MG 2.2 2.0 1.8 1.7  PHOS  --  3.8 2.9 3.6   Liver Function Tests: Recent Labs  Lab 12/21/23 1706 12/23/23 0940 12/24/23 0912  AST 25 29 24   ALT 29 33 31  ALKPHOS 77 72 72  BILITOT 0.3 0.5 0.6  PROT 7.2 6.1* 5.9*  ALBUMIN 3.8 3.2* 3.2*   CBG: No results for input(s): "GLUCAP" in the last 168 hours.  Discharge time spent: greater than 30 minutes.  Signed: Marguerita Merles, DO [facility] 12/24/2023

## 2023-12-24 NOTE — Procedures (Addendum)
Patient Name: Lauren Chung  MRN: [id number]  Epilepsy Attending: Charlsie Quest  Referring Physician/Provider: Marjorie Smolder, NP  Duration: 12/23/2023 1030 to 12/24/2023 1043   Patient history: 58 year old female with history of CVA with left-sided weakness, PE on Coumadin, recent admission with syncope and confusion. EEG to evaluate for seizure   Level of alertness: Awake, asleep   AEDs during EEG study: None   Technical aspects: This EEG study was done with scalp electrodes positioned according to the 10-20 International system of electrode placement. Electrical activity was reviewed with band pass filter of 1-70Hz , sensitivity of 7 uV/mm, display speed of 33mm/sec with a 60Hz  notched filter applied as appropriate. EEG data were recorded continuously and digitally stored.  Video monitoring was available and reviewed as appropriate.   Description: The posterior dominant rhythm consists of 9 Hz activity of moderate voltage (25-35 uV) seen predominantly in posterior head regions, symmetric and reactive to eye opening and eye closing. Sleep was characterized by vertex waves, sleep spindles (12 to 14 Hz), maximal frontocentral region. Hyperventilation and photic stimulation were not performed.      IMPRESSION: This study is within normal limits. No seizures or epileptiform discharges were seen throughout the recording.   A normal interictal EEG does not exclude the diagnosis of epilepsy.   Tonae Livolsi Annabelle Harman

## 2023-12-24 NOTE — Progress Notes (Signed)
NEUROLOGY CONSULT FOLLOW UP NOTE   Date of service: December 24, 2023 Patient Name: Lauren Chung MRN:  [id number] DOB:  1965-12-26  Interval Hx/subjective   Patient reports continued improvement in her dizziness, although it did interfere with ambulation when she worked with PT yesterday.  Vestibular evaluation done by physical therapy was negative.  Overnight EEG was negative for seizures or epileptiform discharges.  Vitals   Vitals:   12/23/23 1528 12/23/23 2003 12/23/23 2320 12/24/23 0312  BP: 100/68 (!) 129/94 125/72 119/77  Pulse: 85 87 81 92  Resp: 17 18 18 18   Temp: 98.8 F (37.1 C) 98.3 F (36.8 C) 98.8 F (37.1 C) 98.2 F (36.8 C)  TempSrc: Oral Oral Oral Oral  SpO2: 98% 99% 96% 98%  Weight:      Height:         Body mass index is 26.76 kg/m.  Physical Exam   Constitutional: Appears well-developed and well-nourished.  Psych: Affect appropriate to situation.  Eyes: No scleral injection.  HENT: No OP obstrucion.  Head: Normocephalic.  Respiratory: Effort normal, non-labored breathing.  Skin: WDI.   Neurologic Examination    NEURO:  Mental Status: Alert and oriented to person place time and situation, able to give clear and coherent history of present illness Speech/Language: speech is without dysarthria or aphasia.    Cranial Nerves:  II: PERRL.  III, IV, VI: EOMI. Eyelids elevate symmetrically.  V: Sensation is intact to light touch and symmetrical to face.  VII: Smile is symmetrical. VIII: hearing intact to voice. IX, X: Phonation is normal.  ON:GEXBMWUX shrug 5/5. XII: tongue is midline without fasciculations. Motor: 5/5 strength to right upper extremity, 4+/5 strength to left upper extremity, 5/5 strength to bilateral lower extremities Tone: is normal and bulk is normal Sensation- Intact to light touch bilaterally.   Coordination: FTN intact bilaterally, heel-knee-shin intact bilaterally Gait- deferred    Medications  Current  Facility-Administered Medications:    0.9 %  sodium chloride infusion, , Intravenous, Continuous, Marguerita Merles [place], [place], Last Rate: 75 mL/hr at 12/23/23 2050, New Bag at 12/23/23 2050   ALPRAZolam Prudy Feeler) tablet 1 mg, 1 mg, Oral, BID PRN, Dolly Rias, MD   hydroxychloroquine (PLAQUENIL) tablet 200 mg, 200 mg, Oral, Daily, Segars, Christiane Ha, MD, 200 mg at 12/24/23 3244   LORazepam (ATIVAN) tablet 0.5 mg, 0.5 mg, Oral, PRN **OR** LORazepam (ATIVAN) injection 0.5 mg, 0.5 mg, Intravenous, PRN, Dolly Rias, MD, 0.5 mg at 12/22/23 0600   meclizine (ANTIVERT) tablet 25 mg, 25 mg, Oral, TID PRN, Dolly Rias, MD   melatonin tablet 6 mg, 6 mg, Oral, QHS PRN, Dolly Rias, MD   predniSONE (DELTASONE) tablet 5 mg, 5 mg, Oral, Q breakfast, Segars, Christiane Ha, MD, 5 mg at 12/24/23 0102   rosuvastatin (CRESTOR) tablet 10 mg, 10 mg, Oral, q AM, Segars, Christiane Ha, MD, 10 mg at 12/24/23 7253   sodium chloride flush (NS) 0.9 % injection 3 mL, 3 mL, Intravenous, Q12H, Segars, Christiane Ha, MD, 3 mL at 12/23/23 1015   Warfarin - Pharmacist Dosing Inpatient, , Does not apply, q1600, Maurice March [facility]  Labs and Diagnostic Imaging   CBC:  Recent Labs  Lab 12/21/23 1706 12/22/23 0317 12/23/23 0940  WBC 13.1* 12.7* 12.7*  NEUTROABS 11.4*  --  8.0*  HGB 13.8 12.5 13.9  HCT 42.5 38.4 41.7  MCV 92.4 93.4 91.2  PLT 252 232 292    Basic Metabolic Panel:  Lab Results  Component Value Date   NA 140 12/23/2023  K 3.5 12/23/2023   CO2 25 12/23/2023   GLUCOSE 87 12/23/2023   BUN 20 12/23/2023   CREATININE 1.25 (H) 12/23/2023   CALCIUM 8.3 (L) 12/23/2023   GFRNONAA 51 (L) 12/23/2023   GFRAA 46 (L) 06/13/2020   Lipid Panel:  Lab Results  Component Value Date   LDLCALC 68 12/22/2023   HgbA1c:  Lab Results  Component Value Date   HGBA1C 4.8 12/22/2023   Urine Drug Screen:     Component Value Date/Time   LABOPIA NONE DETECTED 12/13/2023 1701   COCAINSCRNUR NONE DETECTED 12/13/2023  1701   LABBENZ POSITIVE (A) 12/13/2023 1701   AMPHETMU NONE DETECTED 12/13/2023 1701   THCU NONE DETECTED 12/13/2023 1701   LABBARB NONE DETECTED 12/13/2023 1701    Alcohol Level     Component Value Date/Time   ETH <10 03/05/2022 1554   INR  Lab Results  Component Value Date   INR 2.1 (H) 12/24/2023   APTT  Lab Results  Component Value Date   APTT 26 03/05/2022   CT Head without contrast(Personally reviewed): No acute abnormality, mild chronic microvascular ischemic changes  MRI Brain(Personally reviewed): No acute abnormality, chronic microvascular ischemic changes  rEEG:  Pending  Assessment   Lauren Chung is a 58 y.o. female with history of stroke with left-sided weakness, PE on warfarin, lupus, rheumatoid arthritis, hypertension, hyperlipidemia, CKD 3A and mood disorder who had a recent admission for syncopal episode with left-sided intermittent slowing with sharp contorting-for which she was started on Keppra a couple of days ago.  Patient initially did well after discharge but then developed worsening dizziness and return to the emergency department.  She states that sometimes the dizziness occurs when she is sitting or lying and sometimes it worsens when she is standing up but not always.  She states that meclizine does not help with this much but the dizziness is improving on its own.  Given increase in heart rate with standing and negative hints exam, feel that dizziness is likely peripheral in origin and will await vestibular evaluation with PT.  Routine EEG this admission reveals no seizures or epileptiform discharges.   Her dizziness has been present for many months to years with subacute worsening.  Keppra can cause some dizziness.  She was started on Keppra for the routine EEG a few days ago that showed intermittent left-sided slowing. The recommendation per the epileptologist was to do a prolonged EEG if concern remained for interictal activity but the patient  was started on Keppra and discharged home. Since she is back with symptoms, to best help the patient, and to get a proper diagnosis long-term EEG was done off of Keppra   Long-term EEG was within normal limits and negative for seizures or epileptiform discharges.  Will not restart Keppra given normal findings on LTM EEG.  As dizziness appears to be improving, this could be attributed to Keppra, especially considering that vestibular evaluation was negative.  Recommendations  - Discontinue Keppra - Discontinue long-term EEG -Continue Coumadin with INR goal 2-3 - Continue telemetry monitoring -Neurology will sign off, please call with any questions or concerns Outpatient follow up with established neurology at [facility] (Dr. Everlena Cooper) ______________________________________________________________________ Patient seen by NP and then by MD, MD to edit note as needed.  Signed, Cortney E Ernestina Columbia, NP [facility]   Attending Neurohospitalist Addendum Patient seen and examined with APP/Resident. Agree with the history and physical as documented above. Agree with the plan as documented, which I helped formulate. I  have independently reviewed the chart, obtained history, review of systems and examined the patient.I have personally reviewed pertinent head/neck/spine imaging (CT/MRI). Please feel free to call with any questions.  -- Milon Dikes, MD Neurologist [facility] Pager: [phone]

## 2023-12-24 NOTE — Plan of Care (Signed)
  Problem: Education: Goal: Knowledge of disease or condition will improve Outcome: Progressing   Problem: Ischemic Stroke/TIA Tissue Perfusion: Goal: Complications of ischemic stroke/TIA will be minimized Outcome: Progressing   Problem: Coping: Goal: Will verbalize positive feelings about self Outcome: Progressing   Problem: Coping: Goal: Will identify appropriate support needs Outcome: Progressing   Problem: Health Behavior/Discharge Planning: Goal: Ability to manage health-related needs will improve Outcome: Progressing   Problem: Health Behavior/Discharge Planning: Goal: Goals will be collaboratively established with patient/family Outcome: Progressing   Problem: Self-Care: Goal: Ability to participate in self-care as condition permits will improve Outcome: Progressing   Problem: Self-Care: Goal: Verbalization of feelings and concerns over difficulty with self-care will improve Outcome: Progressing   Problem: Self-Care: Goal: Ability to communicate needs accurately will improve Outcome: Progressing   Problem: Health Behavior/Discharge Planning: Goal: Ability to manage health-related needs will improve Outcome: Progressing   Problem: Clinical Measurements: Goal: Ability to maintain clinical measurements within normal limits will improve Outcome: Progressing   Problem: Skin Integrity: Goal: Risk for impaired skin integrity will decrease Outcome: Progressing

## 2023-12-29 ENCOUNTER — Ambulatory Visit (HOSPITAL_BASED_OUTPATIENT_CLINIC_OR_DEPARTMENT_OTHER): Payer: 59 | Admitting: Psychiatry

## 2023-12-29 VITALS — BP 119/68 | HR 73 | Resp 18 | Ht 68.0 in | Wt 156.8 lb

## 2023-12-29 DIAGNOSIS — R531 Weakness: Secondary | ICD-10-CM | POA: Diagnosis not present

## 2023-12-29 DIAGNOSIS — F329 Major depressive disorder, single episode, unspecified: Secondary | ICD-10-CM

## 2023-12-29 DIAGNOSIS — Z7901 Long term (current) use of anticoagulants: Secondary | ICD-10-CM | POA: Diagnosis not present

## 2023-12-29 DIAGNOSIS — K219 Gastro-esophageal reflux disease without esophagitis: Secondary | ICD-10-CM | POA: Diagnosis not present

## 2023-12-29 DIAGNOSIS — R42 Dizziness and giddiness: Secondary | ICD-10-CM | POA: Diagnosis not present

## 2023-12-29 DIAGNOSIS — F5101 Primary insomnia: Secondary | ICD-10-CM | POA: Diagnosis not present

## 2023-12-29 DIAGNOSIS — M797 Fibromyalgia: Secondary | ICD-10-CM | POA: Diagnosis not present

## 2023-12-29 DIAGNOSIS — M329 Systemic lupus erythematosus, unspecified: Secondary | ICD-10-CM | POA: Diagnosis not present

## 2023-12-29 DIAGNOSIS — Z09 Encounter for follow-up examination after completed treatment for conditions other than malignant neoplasm: Secondary | ICD-10-CM | POA: Diagnosis not present

## 2023-12-29 DIAGNOSIS — G9341 Metabolic encephalopathy: Secondary | ICD-10-CM | POA: Diagnosis not present

## 2023-12-29 DIAGNOSIS — R413 Other amnesia: Secondary | ICD-10-CM | POA: Diagnosis not present

## 2023-12-29 DIAGNOSIS — E876 Hypokalemia: Secondary | ICD-10-CM | POA: Diagnosis not present

## 2023-12-29 MED ORDER — ALPRAZOLAM 1 MG PO TABS: 1.0000 mg | ORAL_TABLET | Freq: Two times a day (BID) | ORAL | 3 refills | Status: DC

## 2023-12-29 MED ORDER — ZOLPIDEM TARTRATE 10 MG PO TABS: 10.0000 mg | ORAL_TABLET | Freq: Every evening | ORAL | 3 refills | Status: DC | PRN

## 2023-12-29 NOTE — Progress Notes (Signed)
 Past Psychiatric Initial Adult Assessment   Patient Identification: Lauren Chung MRN:  [id number] Date of Evaluation:  12/29/2023 Referral Source Lauren Chung Chief Complaint:    Visit Diagnosis:       Today the patient is seen in the office.  The patient is exhausted.  Since I have seen her she had an episode where is either a stroke and/or a seizure but she was hospitalized for over a week.  She just gotten out of the hospital just a day or 2 ago.  She is on a walker.  She has difficulty with balance.  She feels exhausted.  In the hospital they of course took her off her Wellbutrin as they suspected she had a seizure.  Her MRI read as no significant changes.  She still takes her Xanax 1 mg 1 in the morning and 2 at night.  She still takes Ambien 10 mg.  She feels sad.  Her appetite is reduced.  She just been hungry just a day or 2 so I like her to get adjusted a bit more.  Her family stepped up.  Lauren Chung her son is very worried about her.  The patient is not suicidal.  She feels traumatized by what happened.  She has been told that she has to take prednisone indefinitely.  She says she has a lot of pain. Virtual Visit via Telephone Note  I connected with Lauren Chung on 12/29/23 at  4:30 PM EST by telephone and verified that I am speaking with the correct person using two identifiers.  Location: Patient: home Provider: office   I discussed the limitations, risks, security and privacy concerns of performing an evaluation and management service by telephone and the availability of in person appointments. I also discussed with the patient that there may be a patient responsible charge related to this service. The patient expressed understanding and agreed to proceed.      I discussed the assessment and treatment plan with the patient. The patient was provided an opportunity to ask questions and all were answered. The patient agreed with the plan and demonstrated an understanding of  the instructions.   The patient was advised to call back or seek an in-person evaluation if the symptoms worsen or if the condition fails to improve as anticipated.  I provided 25 minutes of non-face-to-face time during this encounter.   Lauren Balsam, MD    Anxiety Symptoms:   Psychotic Symptoms:   PTSD Symptoms:   Past Psychiatric History: Past therapy, one psychiatric hospitalization  Previous Psychotropic Medications: Yes   Substance Abuse History in the last 12 months:  No.  Consequences of Substance Abuse: Negative  Past Medical History:  Past Medical History:  Diagnosis Date   Anemia    Anxiety    Arthritis    CKD (chronic kidney disease) stage 3, GFR 30-59 ml/min (HCC)    Depression    History of blood transfusion    in [place]   Hyperlipidemia    Hypertension    Lupus    PE (pulmonary embolism)    Pneumonia    x 3 - last time 2017   PTSD (post-traumatic stress disorder)    Renal disorder    Rheumatoid arthritis (HCC)    Seizures (HCC)    Stroke (HCC)    many years ago per patient, no deficits   SVD (spontaneous vaginal delivery)    x 3   TIA (transient ischemic attack)    many years ago per patient, no  deficits    Past Surgical History:  Procedure Laterality Date   ABDOMINAL SURGERY     due internal bleeding   ABLATION     gyn procedure for bleeding   CYST EXCISION     top of head   EXCISION MASS HEAD N/A 08/05/2019   Procedure: EXCISION OF SCALP CYST;  Surgeon: Lauren Colonel, MD;  Location: [facility];  Service: ENT;  Laterality: N/A;    Family Psychiatric History:   Family History:  Family History  Problem Relation Age of Onset   Drug abuse Mother    Alcohol abuse Mother    Leukemia Mother    Leukemia Father    Seizures Daughter     Social History:   Social History   Socioeconomic History   Marital status: Single    Spouse name: Not on file   Number of children: 3   Years of education: Not on file   Highest  education level: Not on file  Occupational History   Not on file  Tobacco Use   Smoking status: Never   Smokeless tobacco: Never  Vaping Use   Vaping status: Never Used  Substance and Sexual Activity   Alcohol use: Never   Drug use: Never   Sexual activity: Not Currently    Partners: Male    Birth control/protection: Condom, Post-menopausal  Other Topics Concern   Not on file  Social History Narrative   R handed   Live with son and 3 dogs   Two story   No Caffeine   Social Drivers of Corporate investment banker Strain: Not on file  Food Insecurity: Food Insecurity Present (12/22/2023)   Hunger Vital Sign    Worried About Running Out of Food in the Last Year: Sometimes true    Ran Out of Food in the Last Year: Sometimes true  Transportation Needs: No Transportation Needs (12/22/2023)   PRAPARE - Administrator, Civil Service (Medical): No    Lack of Transportation (Non-Medical): No  Physical Activity: Not on file  Stress: Not on file  Social Connections: Socially Isolated (12/22/2023)   Social Connection and Isolation Panel [NHANES]    Frequency of Communication with Friends and Family: Once a week    Frequency of Social Gatherings with Friends and Family: Once a week    Attends Religious Services: Never    Database administrator or Organizations: No    Attends Banker Meetings: Never    Marital Status: Divorced    Additional Social History:   Allergies:   Allergies  Allergen Reactions   Acetaminophen Nausea And Vomiting   Belsomra [Suvorexant] Shortness Of Breath and Rash   Codeine Shortness Of Breath   Iodinated Contrast Media Shortness Of Breath    Metabolic Disorder Labs: Lab Results  Component Value Date   HGBA1C 4.8 12/22/2023   MPG 91.06 12/22/2023   MPG 96.8 03/05/2022   No results found for: "PROLACTIN" Lab Results  Component Value Date   CHOL 127 12/22/2023   TRIG 90 12/22/2023   HDL 41 12/22/2023   CHOLHDL 3.1  12/22/2023   VLDL 18 12/22/2023   LDLCALC 68 12/22/2023   LDLCALC 52 03/06/2022     Current Medications: Current Outpatient Medications  Medication Sig Dispense Refill   alendronate (FOSAMAX) 70 MG tablet Take 70 mg by mouth once a week.     ALPRAZolam (XANAX) 1 MG tablet Take 1 tablet (1 mg total) by mouth 2 (two)  times daily. 90 tablet 3   docusate sodium (COLACE) 100 MG capsule Take 1 capsule (100 mg total) by mouth 2 (two) times daily. (Patient not taking: Reported on 12/22/2023) 10 capsule 0   hydroxychloroquine (PLAQUENIL) 200 MG tablet Take 200 mg by mouth daily.     meclizine (ANTIVERT) 25 MG tablet Take 1 tablet (25 mg total) by mouth 3 (three) times daily as needed for dizziness. 30 tablet 0   predniSONE (DELTASONE) 10 MG tablet Prednisone 40 mg po daily x 2 day then Prednisone 30 mg po daily x 2 day then Prednisone 20 mg po daily x 2 day then Prednisone 10 mg daily x 2 day then start taking prednisone 5 mg daily till you  see your rheumatologist (Patient taking differently: Take 5-10 mg by mouth See admin instructions. Prednisone 40 mg po daily x 2 day then Prednisone 30 mg po daily x 2 day then Prednisone 20 mg po daily x 2 day then Prednisone 10 mg daily x 2 day then start taking prednisone 5 mg daily till you  see your rheumatologist) 25 tablet 0   predniSONE (DELTASONE) 5 MG tablet Take 1 tablet (5 mg total) by mouth daily with breakfast. (Patient not taking: Reported on 12/22/2023) 30 tablet 3   rosuvastatin (CRESTOR) 10 MG tablet Take 10 mg by mouth in the morning.     warfarin (COUMADIN) 4 MG tablet Take 4 mg by mouth daily.     zolpidem (AMBIEN) 10 MG tablet Take 1 tablet (10 mg total) by mouth at bedtime as needed for sleep. 30 tablet 3   No current facility-administered medications for this visit.    Neurologic: Headache: No Seizure: No Paresthesias:No  Musculoskeletal: Strength & Muscle Tone: within normal limits Gait & Station: normal Patient leans:  N/A  Psychiatric Specialty Exam: ROS  Blood pressure 119/68, pulse 73, resp. rate 18, height 5\' 8"  (1.727 m), weight 156 lb 12.8 oz (71.1 kg).Body mass index is 23.84 kg/m.  General Appearance: Casual  Eye Contact:  Good  Speech:  Clear and Coherent  Volume:  Normal  Mood:  Dysphoric  Affect:  Appropriate  Thought Process:  Goal Directed  Orientation:  NA  Thought Content:  Logical  Suicidal Thoughts:  No  Homicidal Thoughts:  No  Memory:  NA  Judgement:  Good  Insight:  Good  Psychomotor Activity:  Normal  Concentration:    Recall:  Fair  Fund of Knowledge:Fair  Language: Good  Akathisia:  No  Handed:  Right  AIMS (if indicated):    Assets:  Desire for Improvement  ADL's:  Intact  Cognition: WNL  Sleep:      Treatment Plan Summary: 2/25/20254:52 PM     This patient's diagnosis is major depression.  She now is off of Wellbutrin because of the suspicion that she had a seizure but is not clear what the etiology was from.  They told her they thought she had a TIA as she had some neurological symptoms on her left facial area.  She has no obvious evidence of paralysis or paresis.  She has issues with balance.  For now she will continue the Xanax and the Ambien.  She will return to see me in 2 weeks.  At that time we will reassess her and look for an appropriate antidepressant for her.

## 2023-12-31 ENCOUNTER — Encounter: Payer: Self-pay | Admitting: Neurology

## 2023-12-31 ENCOUNTER — Ambulatory Visit (INDEPENDENT_AMBULATORY_CARE_PROVIDER_SITE_OTHER): Payer: 59 | Admitting: Neurology

## 2023-12-31 VITALS — BP 96/59 | HR 71 | Ht 68.0 in | Wt 169.8 lb

## 2023-12-31 DIAGNOSIS — M05741 Rheumatoid arthritis with rheumatoid factor of right hand without organ or systems involvement: Secondary | ICD-10-CM

## 2023-12-31 DIAGNOSIS — R2990 Unspecified symptoms and signs involving the nervous system: Secondary | ICD-10-CM | POA: Diagnosis not present

## 2023-12-31 DIAGNOSIS — M3219 Other organ or system involvement in systemic lupus erythematosus: Secondary | ICD-10-CM

## 2023-12-31 DIAGNOSIS — R400 Somnolence: Secondary | ICD-10-CM | POA: Diagnosis not present

## 2023-12-31 DIAGNOSIS — M05742 Rheumatoid arthritis with rheumatoid factor of left hand without organ or systems involvement: Secondary | ICD-10-CM | POA: Diagnosis not present

## 2023-12-31 DIAGNOSIS — R4182 Altered mental status, unspecified: Secondary | ICD-10-CM

## 2023-12-31 MED ORDER — FLUDROCORTISONE ACETATE 0.1 MG PO TABS: 0.0500 mg | ORAL_TABLET | Freq: Every day | ORAL | 1 refills | Status: DC

## 2023-12-31 NOTE — Addendum Note (Signed)
 Addended by: Melvyn Novas on: 12/31/2023 04:51 PM   Modules accepted: Orders

## 2023-12-31 NOTE — Progress Notes (Addendum)
 [facility]  Provider:  Dr Kanijah Groseclose Referring Provider: Meredeth Ide, MD  Primary Care Physician:  Tally Joe, MD  Chief Complaint  Patient presents with   Follow-up    Pt in room 1.son in room. Here for Internal referral for AMS/Seizure. Pt son said pt has good days and bad days. Pt is weak, uses walker, was at home in room when she passed out. Pt reports daily headache since fall.     HPI:  Lauren Chung is a 58 y.o. female of [place] descent and seen here upon referral from Dr. Sharl Ma for a Consultation/ Evaluation of a seizure..  Treated for Rheumatoid arthritis without rheumatoid factor, multiple sites (HCC)  and Lupus erythematodes, with lupus anticoagulant, having had PE.  CVa 2024, HTN affecting her renal function. She has been on Plaquenil and steroids , CKD stage 3.  she has seen Dr Odis Hollingshead  for palpitations, Lauren Chung is a 58 y.o. female whose past medical history and cardiovascular risk factors include: Hx of pulmonary embolism, hx of CVA /TIA, Lupus, HLD, HTN, CKD, iron deficiency anemia.      She presented on 12-13-2023 to the Ed in altered mental status,  extremely drowsy, barely speaking and there was  a low BP .  Seizure was not witnessed, she was actually found on the floor to her bedroom, blocking access. She had risen from bed and was already extremely drowsy.  Nobody saw a seizure !!! She had a tongue bite..  She came to ED and there an EEG was ordered.  "This 58 year old female with past medical history of lupus, seronegative rheumatoid arthritis, HTN, HLD, anxiety and depression, CKD stage IIIa, PTSD, history of CVA.  [name], Plaquenil, daily prednisone.  Patient has no memory of what occurred today.  [name].  Per report patient had severe fatigue the last few days.  Today family had difficulty waking her up.  She was brought to the ER.  It took over 6 hours for before patient  woke.  Per patient she has had fever on and off for the past 2-1/2 months, Tmax 104.  She reports intermittent headache for the last several months.  She endorses neck pain and occipital head pain.  Her headache can also be generalized.  She endorses cough, dizziness and loss of headaches.  She reports occasional confusion.  Patient states she has not discussed her fever with any physician.  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  He needs to be observed during his activities.  [name] which she reports taking only as prescribed.   MDO patient would like hypotensive with SBP>90, otherwise stable, afebrile.  pH 7.31, ammonia 34, WBCs 8, TSH 2.1, respiratory panel negative.  Urinalysis somewhat suggestive of infection..  UDS positive for benzos.  CT head no acute intracranial pathology.  CXR no acute disease.  Blood cultures x 2 collected.  IV Rocephin given.   This EEG study is suggestive of cortical dysfunction arising from left hemisphere. No seizures seen throughout the recording. Of note, the slowing in left hemisphere appears sharply contoured. If suspicion for interictal activity remains a concern, a prolonged study including sleep should be considered.  Lauren Chung    Returned to ED again on 12-21-2023; Lauren Chung is a 58 y.o. female with hx  of prior CVA with L sided weakness, Hx PE on warfarin, SLE, RA, HTN, HLD, CKD IIIa, Mood d/o, recent admission 2/9 - 14 for possible syncopal episode v mimic with L sided EEG abnormality and started on Keppra, who was referred by her PCP due to vertigo. She reports feeling well on the 14th, but on waking on 2/15 noted that she was unable to sit or stand due to feeling of "dizziness". Difficult for her to differentiate between lightheadedness or vertigo type symptoms. However, no additional syncopal  episodes. Has to "keep her head down" when walking or else feels like she is going to fall. She has a headache over past few days. No other falls or head injury (apart from fall prior to recent hospitalization). Denies subjective weakness, numbness (although noted on exam). No speech or vision changes. She does describe periods of amnesia and family members telling her she is saying "weird things". Apparently called her mother and was asking where the food was (mother lives in [place]) and she has no recollection of this. Due to persistence of symptoms saw her PCP who referred her back to the ED.    When sitting upright during interview had dizziness and headache. Unable to stand without falling.   Hospital Course: Patient is a 58 year old Hispanic female with a past medical history significant for Mann[place] to CVA with left-sided weakness, history of PE on anticoagulation with warfarin, SLE, rheumatoid arthritis, hypertension, hyperlipidemia, CKD stage IIIa, history of mood disorder as well as other comorbidities who had a recent admission on 12/13/2023 until 12/18/2023 for possible syncopal episode versus mimic with left-sided EEG abnormality and was started on Keppra who was referred to her PCP due to vertigo.  She reported feeling well on the 14th but upon waking on the 15th she noted that she was unable to sit or stand during a feeling of "dizziness."  It was difficult for her to differentiate between lightheadedness and vertigo type symptoms.  She had to "keep her head down" when walking around she feels like she will fall.  She also had a headache over the last few days and denied any subjective weakness or any vision changes.  Due to persistent symptoms she saw her PCP who referred her back to the ED and the admitting physician interviewed her and she had dizziness and a headache at that time and was unable to stand without falling.  Neurology was consulted and now planning for LTM EEG and have taken her off the  Keppra at the as they feel that this may be contributing.  She is medically stable for discharge at this time and neurology cleared the patient and she did well with therapy.   She will follow-up with PCP and neurology outpatient setting within 1 to 2 weeks.   Assessment and Plan:   Vertigo, Acute  Left sided ataxia, hypoesthesia ? Chronicity.  Left sided weakness, chronic  -LKWT 2/14 evening, awoke with symptoms of vertigo 2/15 AM. Since this time unable to be upright without vertigo symptoms. On exam there is L sided weakness (chronic), but also L sided hypoesthesia and L sided ataxia, + romberg with falling to the left. Imaging pending, plan for initial MRI as she is out of window for stroke treatments.  -Discussed with Neurology Dr.Kirkpatrick, who recommends for MRI as initial step  -MRI brain with and without contrast done and showed "No acute intracranial abnormality. Stable age advanced, nonspecific chronic cerebral white matter signal abnormality."  -Swallow screen then heart healthy diet -  Serial neurochecks -C/w Tele monitoring and neurology recommended discontinuing Keppra and LTM EEG which was done and she is stable for discharge from a neurological perspective and PT OT recommended home health -She will need to follow-up with [place] at neurology Dr. Everlena Cooper within 1 to 2 weeks and will continue meclizine at discharge   Recent admission with possible Syncope versus Mimic -Abnormal EEG with left hemispheric slowing and sharply contoured.  Started on Keppra 500 mg twice daily per neurology recommendation on last admission.  Concern considering absent prodrome presentation with somnolence on that admission that this prior episode may have represented a seizure -Keppra 500 mg twice daily held by Neurology and they recommended discontinuing it -Start IVF with NS at 75 mL/hr while hospitalized and this is now stopped at the time of discharge -Getting LTM EEG Monitoring and this showed a large  study that was within normal limits with no seizures or epileptiform discharges are seen throughout the recording and normal interictal EEG does not exclude the diagnosis of epilepsy -PT/OT to evaluate and Treat and recommending home health -Repeat Orthostatic VS and she is not orthostatic -Neurology cleared the patient and felt that her dizziness was attributed to Keppra, Kepra d/c , patient kept on xanax.   Referred to Dr Wyline Copas, DO.   But here now at [facility].         Review of Systems: Out of a complete 14 system review, the patient complains of only the following symptoms, and all other reviewed systems are negative.   Social History   Socioeconomic History   Marital status: Single    Spouse name: Not on file   Number of children: 3   Years of education: Not on file   Highest education level: Not on file  Occupational History   Not on file  Tobacco Use   Smoking status: Never   Smokeless tobacco: Never  Vaping Use   Vaping status: Never Used  Substance and Sexual Activity   Alcohol use: Never   Drug use: Never   Sexual activity: Not Currently    Partners: Male    Birth control/protection: Condom, Post-menopausal  Other Topics Concern   Not on file  Social History Narrative   R handed   Live with son and 3 dogs   Two story   No Caffeine   Social Drivers of Corporate investment banker Strain: Not on file  Food Insecurity: Food Insecurity Present (12/22/2023)   Hunger Vital Sign    Worried About Running Out of Food in the Last Year: Sometimes true    Ran Out of Food in the Last Year: Sometimes true  Transportation Needs: No Transportation Needs (12/22/2023)   PRAPARE - Administrator, Civil Service (Medical): No    Lack of Transportation (Non-Medical): No  Physical Activity: Not on file  Stress: Not on file  Social Connections: Socially Isolated (12/22/2023)   Social Connection and Isolation Panel [NHANES]    Frequency of Communication with  Friends and Family: Once a week    Frequency of Social Gatherings with Friends and Family: Once a week    Attends Religious Services: Never    Database administrator or Organizations: No    Attends Banker Meetings: Never    Marital Status: Divorced  Catering manager Violence: Not At Risk (12/22/2023)   Humiliation, Afraid, Rape, and Kick questionnaire    Fear of Current or Ex-Partner: No    Emotionally Abused: No  Physically Abused: No    Sexually Abused: No    Family History  Problem Relation Age of Onset   Drug abuse Mother    Alcohol abuse Mother    Leukemia Mother    Leukemia Father    Seizures Daughter     Past Medical History:  Diagnosis Date   Anemia    Anxiety    Arthritis    CKD (chronic kidney disease) stage 3, GFR 30-59 ml/min (HCC)    Depression    History of blood transfusion    in [place]   Hyperlipidemia    Hypertension    Lupus    PE (pulmonary embolism)    Pneumonia    x 3 - last time 2017   PTSD (post-traumatic stress disorder)    Renal disorder    Rheumatoid arthritis (HCC)    Seizures (HCC)    Stroke (HCC)    many years ago per patient, no deficits   SVD (spontaneous vaginal delivery)    x 3   TIA (transient ischemic attack)    many years ago per patient, no deficits    Past Surgical History:  Procedure Laterality Date   ABDOMINAL SURGERY     due internal bleeding   ABLATION     gyn procedure for bleeding   CYST EXCISION     top of head   EXCISION MASS HEAD N/A 08/05/2019   Procedure: EXCISION OF SCALP CYST;  Surgeon: Serena Colonel, MD;  Location: [facility];  Service: ENT;  Laterality: N/A;    Current Outpatient Medications  Medication Sig Dispense Refill   ALPRAZolam (XANAX) 1 MG tablet Take 1 tablet (1 mg total) by mouth 2 (two) times daily. 90 tablet 3   hydroxychloroquine (PLAQUENIL) 200 MG tablet Take 200 mg by mouth daily.     meclizine (ANTIVERT) 25 MG tablet Take 1 tablet (25 mg total) by mouth 3  (three) times daily as needed for dizziness. 30 tablet 0   predniSONE (DELTASONE) 5 MG tablet Take 1 tablet (5 mg total) by mouth daily with breakfast. 30 tablet 3   rosuvastatin (CRESTOR) 10 MG tablet Take 10 mg by mouth in the morning.     warfarin (COUMADIN) 4 MG tablet Take 4 mg by mouth daily.     zolpidem (AMBIEN) 10 MG tablet Take 1 tablet (10 mg total) by mouth at bedtime as needed for sleep. 30 tablet 3   alendronate (FOSAMAX) 70 MG tablet Take 70 mg by mouth once a week. (Patient not taking: Reported on 12/31/2023)     docusate sodium (COLACE) 100 MG capsule Take 1 capsule (100 mg total) by mouth 2 (two) times daily. (Patient not taking: Reported on 12/22/2023) 10 capsule 0   predniSONE (DELTASONE) 10 MG tablet Prednisone 40 mg po daily x 2 day then Prednisone 30 mg po daily x 2 day then Prednisone 20 mg po daily x 2 day then Prednisone 10 mg daily x 2 day then start taking prednisone 5 mg daily till you  see your rheumatologist (Patient not taking: Reported on 12/31/2023) 25 tablet 0   No current facility-administered medications for this visit.    Allergies as of 12/31/2023 - Review Complete 12/31/2023  Allergen Reaction Noted   Acetaminophen Nausea And Vomiting 12/07/2015   Belsomra [suvorexant] Shortness Of Breath and Rash 05/11/2023   Codeine Shortness Of Breath 12/07/2015   Iodinated contrast media Shortness Of Breath 12/07/2015    Vitals: BP (!) 96/59 (BP Location: Left Arm, Patient  Position: Sitting, Cuff Size: Normal)   Pulse 71   Ht 5\' 8"  (1.727 m)   Wt 169 lb 12.8 oz (77 kg)   LMP  (LMP Unknown)   BMI 25.82 kg/m  Last Weight:  Wt Readings from Last 1 Encounters:  12/31/23 169 lb 12.8 oz (77 kg)   Last Height:   Ht Readings from Last 1 Encounters:  12/31/23 5\' 8"  (1.727 m)   Last BMI: @LASTBMI  Physical exam:  General: The patient is awake, alert and appears not in acute distress.  The patient is well groomed. Head:  Cardiovascular:  Regular rate and palpable  peripheral pulse:  Respiratory: clear to auscultation.  Mallampati2,  Overbite  Skin:  With melasma,  evidence of ede Neurologic exam : The patient is awake and alert, oriented to place and time.  Memory subjective  described as intact-  but objectively  impaired - she is not aware of it, but her children report she has amnesia.  There is a normal attention span & concentration ability.  Speech is fluent without  dysarthria, dysphonia or aphasia.  Mood and affect are appropriate.  Cranial nerves: Pupils are equal and briskly reactive to light.  Funduscopic exam without  evidence of pallor or edema. Extraocular movements  in vertical and horizontal planes intact and without nystagmus. Visual fields by finger perimetry are intact. Hearing to finger rub intact.  Facial sensation intact to fine touch. Facial motor strength is symmetric and tongue and uvula move midline.  Motor exam:   Normal tone and normal muscle bulk and symmetric normal strength in all extremities. Grip Strength was reduced but symmetric  biceps left gave way during testing, functional.  Hip flexion weakness on the left could be functional, too.  Proximal strength of shoulder muscles and hip flexors was intact   Sensory:  Fine touch and vibration were tested . Proprioception was tested in the upper extremities only and was  normal.  Coordination: Rapid alternating movements in the fingers/hands were normal.  Finger-to-nose maneuver was tested and showed evidence of ataxia, dysmetria   Gait and station: Patient walked with assistive device - walker .   Core Strength abnormal, she has been able  to stand up without bracing but used momentum. She has been walking very slow and turned even slower, feeling dizzy the whole way.  She is looking at her feet.  Looking to the end of the hallways door causes her to feel dizzy.    Deep tendon reflexes: in the  upper and lower extremities are symmetric and  brisk  and cross  reflexia is seen. No Clonus. Babinski maneuver response is  downgoing.   Assessment: Total time for face to face interview and examination, for review of  images and laboratory testing, neurophysiology testing and pre-existing records, including out-of -network , was 45 minutes. Assessment is as follows here:  1)   seizure versus syncope-  remaining  dizzy and slowed.   Abnormal EEG in Hospital , will repeat here for a valid baseline.    Patient has a potential  for Lupus encephalitis,    MRI  reviewed shows  extensive white matter disease and  atrophy of the temporal lobes. NO SIGN OF CVA _   2)  She has  been told to stay indefinitely on steroids as her adrenal gland is no longer producing enough. Addison's disease.  5 mg daily.   Keep hydrating with water, and  we may have to add midodrine to keep BP up.  3) anxiety, depression followed by dr Donell Beers.     Plan:  Treatment plan and additional workup planned after today includes:   DRIVING and operating machinery is prohibited for 6 months.   1)  EEG repeat  2)  rheumatology follow up 3) hypotension  to be addressed by PCP  4) referral to PT gait was already done per hospital but patient has not received any calls.   RV 3-4 months.      Melvyn Novas, MD

## 2024-01-05 ENCOUNTER — Emergency Department (HOSPITAL_COMMUNITY)

## 2024-01-05 ENCOUNTER — Other Ambulatory Visit: Payer: Self-pay

## 2024-01-05 ENCOUNTER — Inpatient Hospital Stay (HOSPITAL_COMMUNITY)
Admission: EM | Admit: 2024-01-05 | Discharge: 2024-01-13 | DRG: 644 | Disposition: A | Discharge status: Home / Self Care | Attending: Internal Medicine | Admitting: Internal Medicine

## 2024-01-05 DIAGNOSIS — Z7952 Long term (current) use of systemic steroids: Secondary | ICD-10-CM

## 2024-01-05 DIAGNOSIS — E785 Hyperlipidemia, unspecified: Secondary | ICD-10-CM | POA: Diagnosis not present

## 2024-01-05 DIAGNOSIS — N179 Acute kidney failure, unspecified: Secondary | ICD-10-CM | POA: Diagnosis present

## 2024-01-05 DIAGNOSIS — I951 Orthostatic hypotension: Secondary | ICD-10-CM | POA: Diagnosis present

## 2024-01-05 DIAGNOSIS — E2740 Unspecified adrenocortical insufficiency: Principal | ICD-10-CM | POA: Diagnosis present

## 2024-01-05 DIAGNOSIS — Z885 Allergy status to narcotic agent status: Secondary | ICD-10-CM

## 2024-01-05 DIAGNOSIS — R296 Repeated falls: Secondary | ICD-10-CM | POA: Diagnosis present

## 2024-01-05 DIAGNOSIS — R55 Syncope and collapse: Secondary | ICD-10-CM | POA: Diagnosis not present

## 2024-01-05 DIAGNOSIS — Z743 Need for continuous supervision: Secondary | ICD-10-CM | POA: Diagnosis not present

## 2024-01-05 DIAGNOSIS — I69354 Hemiplegia and hemiparesis following cerebral infarction affecting left non-dominant side: Secondary | ICD-10-CM | POA: Diagnosis not present

## 2024-01-05 DIAGNOSIS — N1832 Chronic kidney disease, stage 3b: Secondary | ICD-10-CM | POA: Diagnosis not present

## 2024-01-05 DIAGNOSIS — Z7960 Long term (current) use of unspecified immunomodulators and immunosuppressants: Secondary | ICD-10-CM

## 2024-01-05 DIAGNOSIS — H547 Unspecified visual loss: Secondary | ICD-10-CM | POA: Diagnosis present

## 2024-01-05 DIAGNOSIS — Z813 Family history of other psychoactive substance abuse and dependence: Secondary | ICD-10-CM

## 2024-01-05 DIAGNOSIS — F32A Depression, unspecified: Secondary | ICD-10-CM | POA: Diagnosis present

## 2024-01-05 DIAGNOSIS — Z811 Family history of alcohol abuse and dependence: Secondary | ICD-10-CM

## 2024-01-05 DIAGNOSIS — Z888 Allergy status to other drugs, medicaments and biological substances status: Secondary | ICD-10-CM | POA: Diagnosis not present

## 2024-01-05 DIAGNOSIS — M329 Systemic lupus erythematosus, unspecified: Secondary | ICD-10-CM | POA: Diagnosis present

## 2024-01-05 DIAGNOSIS — Z86711 Personal history of pulmonary embolism: Secondary | ICD-10-CM | POA: Diagnosis not present

## 2024-01-05 DIAGNOSIS — I129 Hypertensive chronic kidney disease with stage 1 through stage 4 chronic kidney disease, or unspecified chronic kidney disease: Secondary | ICD-10-CM | POA: Diagnosis present

## 2024-01-05 DIAGNOSIS — R404 Transient alteration of awareness: Secondary | ICD-10-CM | POA: Diagnosis not present

## 2024-01-05 DIAGNOSIS — F419 Anxiety disorder, unspecified: Secondary | ICD-10-CM | POA: Diagnosis present

## 2024-01-05 DIAGNOSIS — N1831 Chronic kidney disease, stage 3a: Secondary | ICD-10-CM | POA: Diagnosis not present

## 2024-01-05 DIAGNOSIS — Z7901 Long term (current) use of anticoagulants: Secondary | ICD-10-CM

## 2024-01-05 DIAGNOSIS — M0600 Rheumatoid arthritis without rheumatoid factor, unspecified site: Secondary | ICD-10-CM | POA: Diagnosis present

## 2024-01-05 DIAGNOSIS — Z91041 Radiographic dye allergy status: Secondary | ICD-10-CM

## 2024-01-05 DIAGNOSIS — S0990XA Unspecified injury of head, initial encounter: Secondary | ICD-10-CM | POA: Diagnosis not present

## 2024-01-05 DIAGNOSIS — I959 Hypotension, unspecified: Secondary | ICD-10-CM | POA: Diagnosis not present

## 2024-01-05 DIAGNOSIS — R42 Dizziness and giddiness: Secondary | ICD-10-CM | POA: Diagnosis not present

## 2024-01-05 DIAGNOSIS — Z043 Encounter for examination and observation following other accident: Secondary | ICD-10-CM | POA: Diagnosis not present

## 2024-01-05 DIAGNOSIS — F4310 Post-traumatic stress disorder, unspecified: Secondary | ICD-10-CM | POA: Diagnosis present

## 2024-01-05 DIAGNOSIS — E876 Hypokalemia: Secondary | ICD-10-CM | POA: Diagnosis present

## 2024-01-05 DIAGNOSIS — W19XXXA Unspecified fall, initial encounter: Secondary | ICD-10-CM | POA: Diagnosis not present

## 2024-01-05 DIAGNOSIS — R6889 Other general symptoms and signs: Secondary | ICD-10-CM | POA: Diagnosis not present

## 2024-01-05 DIAGNOSIS — Z9181 History of falling: Secondary | ICD-10-CM

## 2024-01-05 DIAGNOSIS — G4700 Insomnia, unspecified: Secondary | ICD-10-CM | POA: Diagnosis not present

## 2024-01-05 DIAGNOSIS — M069 Rheumatoid arthritis, unspecified: Secondary | ICD-10-CM | POA: Diagnosis not present

## 2024-01-05 DIAGNOSIS — I6930 Unspecified sequelae of cerebral infarction: Secondary | ICD-10-CM | POA: Diagnosis not present

## 2024-01-05 DIAGNOSIS — D84821 Immunodeficiency due to drugs: Secondary | ICD-10-CM | POA: Diagnosis present

## 2024-01-05 DIAGNOSIS — R251 Tremor, unspecified: Secondary | ICD-10-CM | POA: Diagnosis not present

## 2024-01-05 DIAGNOSIS — G40909 Epilepsy, unspecified, not intractable, without status epilepticus: Secondary | ICD-10-CM | POA: Diagnosis present

## 2024-01-05 DIAGNOSIS — R569 Unspecified convulsions: Secondary | ICD-10-CM | POA: Diagnosis not present

## 2024-01-05 DIAGNOSIS — Z806 Family history of leukemia: Secondary | ICD-10-CM

## 2024-01-05 DIAGNOSIS — G43909 Migraine, unspecified, not intractable, without status migrainosus: Secondary | ICD-10-CM | POA: Diagnosis not present

## 2024-01-05 DIAGNOSIS — M328 Other forms of systemic lupus erythematosus: Secondary | ICD-10-CM | POA: Diagnosis not present

## 2024-01-05 DIAGNOSIS — M542 Cervicalgia: Secondary | ICD-10-CM | POA: Diagnosis not present

## 2024-01-05 DIAGNOSIS — Z79899 Other long term (current) drug therapy: Secondary | ICD-10-CM

## 2024-01-05 DIAGNOSIS — Z7983 Long term (current) use of bisphosphonates: Secondary | ICD-10-CM

## 2024-01-05 DIAGNOSIS — G44309 Post-traumatic headache, unspecified, not intractable: Secondary | ICD-10-CM | POA: Diagnosis not present

## 2024-01-05 LAB — COMPREHENSIVE METABOLIC PANEL
ALT: 31 U/L (ref 0–44)
AST: 28 U/L (ref 15–41)
Albumin: 3.2 g/dL — ABNORMAL LOW (ref 3.5–5.0)
Alkaline Phosphatase: 70 U/L (ref 38–126)
Anion gap: 5 (ref 5–15)
BUN: 12 mg/dL (ref 6–20)
CO2: 26 mmol/L (ref 22–32)
Calcium: 8.2 mg/dL — ABNORMAL LOW (ref 8.9–10.3)
Chloride: 105 mmol/L (ref 98–111)
Creatinine, Ser: 1.33 mg/dL — ABNORMAL HIGH (ref 0.44–1.00)
GFR, Estimated: 47 mL/min — ABNORMAL LOW (ref >60)
Glucose, Bld: 84 mg/dL (ref 70–99)
Potassium: 4.2 mmol/L (ref 3.5–5.1)
Sodium: 136 mmol/L (ref 135–145)
Total Bilirubin: 0.5 mg/dL (ref 0.0–1.2)
Total Protein: 6 g/dL — ABNORMAL LOW (ref 6.5–8.1)

## 2024-01-05 LAB — CBC
HCT: 40.8 % (ref 36.0–46.0)
Hemoglobin: 13.1 g/dL (ref 12.0–15.0)
MCH: 30.1 pg (ref 26.0–34.0)
MCHC: 32.1 g/dL (ref 30.0–36.0)
MCV: 93.8 fL (ref 80.0–100.0)
Platelets: 216 10*3/uL (ref 150–400)
RBC: 4.35 MIL/uL (ref 3.87–5.11)
RDW: 13.4 % (ref 11.5–15.5)
WBC: 6.5 10*3/uL (ref 4.0–10.5)
nRBC: 0 % (ref 0.0–0.2)

## 2024-01-05 LAB — I-STAT CG4 LACTIC ACID, ED: Lactic Acid, Venous: 1 mmol/L (ref 0.5–1.9)

## 2024-01-05 NOTE — Progress Notes (Signed)
 Orthopedic Tech Progress Note Patient Details:  Lauren Chung [birth year] [id number]  Patient ID: [name], female   DOB: 11-03-1966, 58 y.o.   MRN: [id number] Level II; not currently needed. Darleen Crocker 01/05/2024, 11:26 PM

## 2024-01-05 NOTE — ED Provider Notes (Signed)
 [facility] Emergency Department Provider Note MRN:  [id number]  Arrival date & time: 01/06/24     Chief Complaint   Fall History of Present Illness   Lauren Chung is a 58 y.o. year-old female with a history of PE on blood thinners presenting to the ED with chief complaint of fall.  2 falls this evening at home with head trauma, hit the back of her head against the carpeted floor.  [name] while trying to get from the bed to the bathroom.  Reportedly soft blood pressure with EMS.  Possible history of seizure disorder.  Review of Systems  A thorough review of systems was obtained and all systems are negative except as noted in the HPI and PMH.   Patient's Health History    Past Medical History:  Diagnosis Date   Anemia    Anxiety    Arthritis    CKD (chronic kidney disease) stage 3, GFR 30-59 ml/min (HCC)    Depression    History of blood transfusion    in [place]   Hyperlipidemia    Hypertension    Lupus    PE (pulmonary embolism)    Pneumonia    x 3 - last time 2017   PTSD (post-traumatic stress disorder)    Renal disorder    Rheumatoid arthritis (HCC)    Seizures (HCC)    Stroke (HCC)    many years ago per patient, no deficits   SVD (spontaneous vaginal delivery)    x 3   TIA (transient ischemic attack)    many years ago per patient, no deficits    Past Surgical History:  Procedure Laterality Date   ABDOMINAL SURGERY     due internal bleeding   ABLATION     gyn procedure for bleeding   CYST EXCISION     top of head   EXCISION MASS HEAD N/A 08/05/2019   Procedure: EXCISION OF SCALP CYST;  Surgeon: Serena Colonel, MD;  Location: [facility];  Service: ENT;  Laterality: N/A;    Family History  Problem Relation Age of Onset   Drug abuse Mother    Alcohol abuse Mother    Leukemia Mother    Leukemia Father    Seizures Daughter     Social History   Socioeconomic History   Marital status: Single    Spouse name: Not on file    Number of children: 3   Years of education: Not on file   Highest education level: Not on file  Occupational History   Not on file  Tobacco Use   Smoking status: Never   Smokeless tobacco: Never  Vaping Use   Vaping status: Never Used  Substance and Sexual Activity   Alcohol use: Never   Drug use: Never   Sexual activity: Not Currently    Partners: Male    Birth control/protection: Condom, Post-menopausal  Other Topics Concern   Not on file  Social History Narrative   R handed   Live with son and 3 dogs   Two story   No Caffeine   Social Drivers of Corporate investment banker Strain: Not on file  Food Insecurity: Food Insecurity Present (12/22/2023)   Hunger Vital Sign    Worried About Running Out of Food in the Last Year: Sometimes true    Ran Out of Food in the Last Year: Sometimes true  Transportation Needs: No Transportation Needs (12/22/2023)   PRAPARE - Transportation    Lack of Transportation (  Medical): No    Lack of Transportation (Non-Medical): No  Physical Activity: Not on file  Stress: Not on file  Social Connections: Socially Isolated (12/22/2023)   Social Connection and Isolation Panel [NHANES]    Frequency of Communication with Friends and Family: Once a week    Frequency of Social Gatherings with Friends and Family: Once a week    Attends Religious Services: Never    Database administrator or Organizations: No    Attends Banker Meetings: Never    Marital Status: Divorced  Catering manager Violence: Not At Risk (12/22/2023)   Humiliation, Afraid, Rape, and Kick questionnaire    Fear of Current or Ex-Partner: No    Emotionally Abused: No    Physically Abused: No    Sexually Abused: No     Physical Exam   Vitals:   01/06/24 0158 01/06/24 0245  BP: 106/73 (!) 101/56  Pulse: 64 (!) 58  Resp: 16 17  Temp:    SpO2: 96% 98%    CONSTITUTIONAL: Well-appearing, NAD NEURO/PSYCH: Mildly somnolent but answers questions, follows commands,  moves all extremities equally EYES:  eyes equal and reactive ENT/NECK:  no LAD, no JVD CARDIO: Regular rate, well-perfused, normal S1 and S2 PULM:  CTAB no wheezing or rhonchi GI/GU:  non-distended, non-tender MSK/SPINE:  No gross deformities, no edema, no midline spinal tenderness SKIN:  no rash, atraumatic   *Additional and/or pertinent findings included in MDM below  Diagnostic and Interventional Summary    EKG Interpretation Date/Time:  Tuesday January 05 2024 23:19:53 EST Ventricular Rate:  67 PR Interval:  174 QRS Duration:  116 QT Interval:  401 QTC Calculation: 424 R Axis:   64  Text Interpretation: Sinus rhythm Nonspecific intraventricular conduction delay Confirmed by Kennis Carina [phone]) on 01/05/2024 11:32:26 PM       Labs Reviewed  COMPREHENSIVE METABOLIC PANEL - Abnormal; Notable for the following components:      Result Value   Creatinine, Ser 1.33 (*)    Calcium 8.2 (*)    Total Protein 6.0 (*)    Albumin 3.2 (*)    GFR, Estimated 47 (*)    All other components within normal limits  CBC  ETHANOL  PROTIME-INR  I-STAT CG4 LACTIC ACID, ED  I-STAT CG4 LACTIC ACID, ED    CT HEAD WO CONTRAST ( )  Final Result    CT CERVICAL SPINE WO CONTRAST  Final Result    DG Chest Port 1 View  Final Result      Medications  meclizine (ANTIVERT) tablet 25 mg (has no administration in time range)  0.9 %  sodium chloride infusion (has no administration in time range)  sodium chloride 0.9 % bolus 1,000 mL (1,000 mLs Intravenous New Bag/Given 01/06/24 0156)     Procedures  /  Critical Care Procedures  ED Course and Medical Decision Making  Initial Impression and Ddx Patient seems a bit somnolent after head trauma, question concussion versus intracranial bleeding versus postictal state.  Per chart review patient was recently admitted for a few days after similar presentation of persistent dizziness causing falls.  Thorough evaluation with normal MRI brain, overall  reassuring EEG with neurology recommending discontinuation of antiepileptics.  General recommendation from neurology at that time was outpatient vestibular PT.  Other consideration includes drug or alcohol use, electrolyte disturbance.  Patient denies any chest pain or shortness of breath.  Past medical/surgical history that increases complexity of ED encounter: PE on Coumadin  Interpretation of Diagnostics I  personally reviewed the EKG and my interpretation is as follows: Sinus rhythm  No significant blood count or electrolyte disturbance.  Patient Reassessment and Ultimate Disposition/Management     Patient with continued gait instability and dizziness, fall risk, plan is for hospitalist admission.  Some soft blood pressure that improved with fluids.  Patient management required discussion with the following services or consulting groups:  Hospitalist Service  Complexity of Problems Addressed Acute illness or injury that poses threat of life of bodily function  Additional Data Reviewed and Analyzed Further history obtained from: Prior labs/imaging results  Additional Factors Impacting ED Encounter Risk Consideration of hospitalization  Elmer Sow. Pilar Plate, MD [facility] Emergency Medicine [facility] [email or web address]  Final Clinical Impressions(s) / ED Diagnoses     ICD-10-CM   1. Dizzy  R42       ED Discharge Orders     None        Discharge Instructions Discussed with and Provided to Patient:   Discharge Instructions   None      Sabas Sous, MD 01/06/24 [phone]

## 2024-01-05 NOTE — ED Triage Notes (Signed)
 Pt BIB [facility] from home. Per report pt had increased weakness recently, multiple falls recently, 2 tonight. +LOC, hit head on carpet and the hardwood floor. Pt does take coumadin. EMS states pt is ortho static.   Hx of seizures

## 2024-01-06 ENCOUNTER — Encounter (HOSPITAL_COMMUNITY): Payer: Self-pay | Admitting: Internal Medicine

## 2024-01-06 DIAGNOSIS — F419 Anxiety disorder, unspecified: Secondary | ICD-10-CM

## 2024-01-06 DIAGNOSIS — Z86711 Personal history of pulmonary embolism: Secondary | ICD-10-CM | POA: Diagnosis not present

## 2024-01-06 DIAGNOSIS — N1831 Chronic kidney disease, stage 3a: Secondary | ICD-10-CM

## 2024-01-06 DIAGNOSIS — F32A Depression, unspecified: Secondary | ICD-10-CM

## 2024-01-06 DIAGNOSIS — R55 Syncope and collapse: Secondary | ICD-10-CM | POA: Diagnosis not present

## 2024-01-06 DIAGNOSIS — R42 Dizziness and giddiness: Secondary | ICD-10-CM

## 2024-01-06 DIAGNOSIS — I6930 Unspecified sequelae of cerebral infarction: Secondary | ICD-10-CM

## 2024-01-06 DIAGNOSIS — M0600 Rheumatoid arthritis without rheumatoid factor, unspecified site: Secondary | ICD-10-CM

## 2024-01-06 DIAGNOSIS — I959 Hypotension, unspecified: Secondary | ICD-10-CM

## 2024-01-06 DIAGNOSIS — M328 Other forms of systemic lupus erythematosus: Secondary | ICD-10-CM

## 2024-01-06 DIAGNOSIS — R296 Repeated falls: Secondary | ICD-10-CM

## 2024-01-06 LAB — RESPIRATORY PANEL BY PCR

## 2024-01-06 LAB — URINALYSIS, ROUTINE W REFLEX MICROSCOPIC
Bilirubin Urine: NEGATIVE
Glucose, UA: NEGATIVE mg/dL
Hgb urine dipstick: NEGATIVE
Ketones, ur: NEGATIVE mg/dL
Nitrite: NEGATIVE
Protein, ur: NEGATIVE mg/dL
Specific Gravity, Urine: 1.009 (ref 1.005–1.030)
pH: 7 (ref 5.0–8.0)

## 2024-01-06 LAB — SARS CORONAVIRUS 2 BY RT PCR: SARS Coronavirus 2 by RT PCR: NEGATIVE

## 2024-01-06 LAB — PROTIME-INR
INR: 1.6 — ABNORMAL HIGH (ref 0.8–1.2)
Prothrombin Time: 19.2 s — ABNORMAL HIGH (ref 11.4–15.2)

## 2024-01-06 LAB — C-REACTIVE PROTEIN: CRP: 0.6 mg/dL (ref <1.0)

## 2024-01-06 LAB — ETHANOL: Alcohol, Ethyl (B): <10 mg/dL (ref <10)

## 2024-01-06 LAB — SEDIMENTATION RATE: Sed Rate: 7 mm/h (ref 0–22)

## 2024-01-06 MED ORDER — ALPRAZOLAM 0.5 MG PO TABS
0.5000 mg | ORAL_TABLET | Freq: Two times a day (BID) | ORAL | Status: DC | PRN
  Administered 2024-01-11 – 2024-01-13 (×4): 0.5 mg via ORAL
  Filled 2024-01-06 (×4): qty 1

## 2024-01-06 MED ORDER — ONDANSETRON HCL 4 MG/2ML IJ SOLN
4.0000 mg | Freq: Four times a day (QID) | INTRAMUSCULAR | Status: DC | PRN
Start: 2024-01-06 — End: 2024-01-13

## 2024-01-06 MED ORDER — MECLIZINE HCL 25 MG PO TABS
25.0000 mg | ORAL_TABLET | Freq: Three times a day (TID) | ORAL | Status: DC | PRN
  Administered 2024-01-11: 25 mg via ORAL
  Filled 2024-01-06: qty 1

## 2024-01-06 MED ORDER — SODIUM CHLORIDE 0.9 % IV BOLUS
500.0000 mL | Freq: Once | INTRAVENOUS | Status: AC
  Administered 2024-01-06: 500 mL via INTRAVENOUS

## 2024-01-06 MED ORDER — ALBUTEROL SULFATE (2.5 MG/3ML) 0.083% IN NEBU: 2.5000 mg | INHALATION_SOLUTION | Freq: Four times a day (QID) | RESPIRATORY_TRACT | Status: DC | PRN

## 2024-01-06 MED ORDER — PREDNISONE 5 MG PO TABS
5.0000 mg | ORAL_TABLET | Freq: Every day | ORAL | Status: DC
  Administered 2024-01-07 – 2024-01-09 (×3): 5 mg via ORAL
  Filled 2024-01-06 (×3): qty 1

## 2024-01-06 MED ORDER — HYDROCORTISONE SOD SUC (PF) 100 MG IJ SOLR
100.0000 mg | Freq: Once | INTRAMUSCULAR | Status: AC
  Administered 2024-01-06: 100 mg via INTRAVENOUS
  Filled 2024-01-06: qty 2

## 2024-01-06 MED ORDER — ROSUVASTATIN CALCIUM 5 MG PO TABS
10.0000 mg | ORAL_TABLET | Freq: Every morning | ORAL | Status: DC
  Administered 2024-01-07 – 2024-01-13 (×7): 10 mg via ORAL
  Filled 2024-01-06 (×7): qty 2

## 2024-01-06 MED ORDER — ONDANSETRON HCL 4 MG PO TABS
4.0000 mg | ORAL_TABLET | Freq: Four times a day (QID) | ORAL | Status: DC | PRN
Start: 2024-01-06 — End: 2024-01-13

## 2024-01-06 MED ORDER — MECLIZINE HCL 25 MG PO TABS
25.0000 mg | ORAL_TABLET | Freq: Once | ORAL | Status: AC
  Administered 2024-01-06: 25 mg via ORAL
  Filled 2024-01-06: qty 1

## 2024-01-06 MED ORDER — ENSURE ENLIVE PO LIQD: 237.0000 mL | Freq: Two times a day (BID) | ORAL | Status: DC

## 2024-01-06 MED ORDER — HEPARIN (PORCINE) 25000 UT/250ML-% IV SOLN
900.0000 [IU]/h | INTRAVENOUS | Status: DC
Start: 2024-01-06 — End: 2024-01-08
  Administered 2024-01-06: 1200 [IU]/h via INTRAVENOUS
  Administered 2024-01-08: 1050 [IU]/h via INTRAVENOUS
  Filled 2024-01-06 (×2): qty 250

## 2024-01-06 MED ORDER — SODIUM CHLORIDE 0.9 % IV BOLUS
1000.0000 mL | Freq: Once | INTRAVENOUS | Status: AC
  Administered 2024-01-06: 1000 mL via INTRAVENOUS

## 2024-01-06 MED ORDER — FLUDROCORTISONE ACETATE 0.1 MG PO TABS
0.0500 mg | ORAL_TABLET | Freq: Every day | ORAL | Status: DC
  Administered 2024-01-06 – 2024-01-08 (×3): 0.05 mg via ORAL
  Filled 2024-01-06 (×3): qty 1

## 2024-01-06 MED ORDER — SODIUM CHLORIDE 0.9 % IV SOLN: INTRAVENOUS | Status: AC

## 2024-01-06 MED ORDER — SODIUM CHLORIDE 0.9% FLUSH
3.0000 mL | Freq: Two times a day (BID) | INTRAVENOUS | Status: DC
  Administered 2024-01-07 – 2024-01-13 (×8): 3 mL via INTRAVENOUS

## 2024-01-06 MED ORDER — HYDROXYCHLOROQUINE SULFATE 200 MG PO TABS
200.0000 mg | ORAL_TABLET | Freq: Every day | ORAL | Status: DC
  Administered 2024-01-06 – 2024-01-13 (×8): 200 mg via ORAL
  Filled 2024-01-06 (×8): qty 1

## 2024-01-06 NOTE — Progress Notes (Signed)
 Transition of Care [facility]) - CAGE-AID Screening   Patient Details  Name: Lauren Chung MRN: [id number] Date of Birth: August 11, 1966  Hewitt Shorts, RN Trauma response Nurse Phone Number: [phone] 01/06/2024, 4:21 PM   CAGE-AID Screening:    Have You Ever Felt You Ought to Cut Down on Your Drinking or Drug Use?: No Have People Annoyed You By Critizing Your Drinking Or Drug Use?: No Have You Felt Bad Or Guilty About Your Drinking Or Drug Use?: No Have You Ever Had a Drink or Used Drugs First Thing In The Morning to Steady Your Nerves or to Get Rid of a Hangover?: No CAGE-AID Score: 0  Substance Abuse Education Offered: (S) No (No services needed)

## 2024-01-06 NOTE — Progress Notes (Signed)
 PHARMACY - ANTICOAGULATION CONSULT NOTE  Pharmacy Consult for heparin Indication: hx pulmonary embolus  Allergies  Allergen Reactions   Acetaminophen Nausea And Vomiting   Belsomra [Suvorexant] Shortness Of Breath and Rash   Codeine Shortness Of Breath   Iodinated Contrast Media Shortness Of Breath    Patient Measurements: Height: 5\' 8"  (172.7 cm) Weight: 77 kg (169 lb 12.1 oz) IBW/kg (Calculated) : 63.9 Heparin Dosing Weight: TBW  Vital Signs: Temp: 98.7 F (37.1 C) (03/05 1520) Temp Source: Oral (03/05 1520) BP: 91/69 (03/05 1520) Pulse Rate: 63 (03/05 1520)  Labs: Recent Labs    01/05/24 2313  HGB 13.1  HCT 40.8  PLT 216  CREATININE 1.33*    Estimated Creatinine Clearance: 51.5 mL/min (A) (by C-G formula based on SCr of 1.33 mg/dL (H)).   Medical History: Past Medical History:  Diagnosis Date   Anemia    Anxiety    Arthritis    CKD (chronic kidney disease) stage 3, GFR 30-59 ml/min (HCC)    Depression    History of blood transfusion    in [place]   Hyperlipidemia    Hypertension    Lupus    PE (pulmonary embolism)    Pneumonia    x 3 - last time 2017   PTSD (post-traumatic stress disorder)    Renal disorder    Rheumatoid arthritis (HCC)    Seizures (HCC)    Stroke (HCC)    many years ago per patient, no deficits   SVD (spontaneous vaginal delivery)    x 3   TIA (transient ischemic attack)    many years ago per patient, no deficits    Medications:  Medications Prior to Admission  Medication Sig Dispense Refill Last Dose/Taking   ALPRAZolam (XANAX) 1 MG tablet Take 1 tablet (1 mg total) by mouth 2 (two) times daily. 90 tablet 3 01/04/2024   fludrocortisone (FLORINEF) 0.1 MG tablet Take 0.5 tablets (0.05 mg total) by mouth daily. 30 tablet 1 01/05/2024 Morning   hydroxychloroquine (PLAQUENIL) 200 MG tablet Take 200 mg by mouth daily.   01/05/2024 Morning   meclizine (ANTIVERT) 25 MG tablet Take 1 tablet (25 mg total) by mouth 3 (three) times daily as  needed for dizziness. 30 tablet 0 01/05/2024 Evening   metoprolol succinate (TOPROL-XL) 25 MG 24 hr tablet Take 25 mg by mouth every morning.   01/05/2024   predniSONE (DELTASONE) 5 MG tablet Take 1 tablet (5 mg total) by mouth daily with breakfast. 30 tablet 3 01/05/2024 Morning   rosuvastatin (CRESTOR) 10 MG tablet Take 10 mg by mouth in the morning.   01/05/2024 Morning   warfarin (COUMADIN) 4 MG tablet Take 4 mg by mouth daily.   01/05/2024 at  7:00 PM   zolpidem (AMBIEN) 10 MG tablet Take 1 tablet (10 mg total) by mouth at bedtime as needed for sleep. 30 tablet 3 Past Week   alendronate (FOSAMAX) 70 MG tablet Take 70 mg by mouth once a week. (Patient not taking: Reported on 12/31/2023)   Not Taking   docusate sodium (COLACE) 100 MG capsule Take 1 capsule (100 mg total) by mouth 2 (two) times daily. (Patient not taking: Reported on 12/22/2023) 10 capsule 0     Assessment: 47 yof who is admitted s/p falls at home with LOC; diplopia also noted. PMH includes SLE, RA, HTN, HLD, CVA / TIA, hx PE on warfarin PTA, anxiety / depression, CKD3a, PTSD, migraines, and vision impairment. Pharmacy consulted to begin IV heparin while warfarin is  on hold for possible procedure.  INR is subtherapeutic at 1.6 therefore heparin infusion can be started. No bleeding noted, CBC was normal on 3/4. CT head neg for bleed.  Warfarin PTA: 4 mg daily, last dose 3/4  Goal of Therapy:  Heparin level 0.3-0.7 units/ml Monitor platelets by anticoagulation protocol: Yes   Plan:  Begin heparin infusion at 1200 units/hr 6 hr heparin level Daily heparin level and CBC Monitor for signs/symptoms of bleeding  Thank you for involving pharmacy in this patient's care.  Loura Back, PharmD, BCPS Clinical Pharmacist Clinical phone for 01/06/2024 is [phone] 01/06/2024 3:38 PM

## 2024-01-06 NOTE — Plan of Care (Signed)
  Problem: Clinical Measurements: Goal: Will remain free from infection Outcome: Progressing Goal: Respiratory complications will improve Outcome: Progressing Goal: Cardiovascular complication will be avoided Outcome: Progressing   Problem: Coping: Goal: Level of anxiety will decrease Outcome: Progressing   Problem: Pain Managment: Goal: General experience of comfort will improve and/or be controlled Outcome: Progressing   Problem: Safety: Goal: Ability to remain free from injury will improve Outcome: Progressing   Problem: Skin Integrity: Goal: Risk for impaired skin integrity will decrease Outcome: Progressing

## 2024-01-06 NOTE — ED Notes (Signed)
 Around 8:30 I took pt to bathroom in a wheelchair to void and brush her teeth.  She bore weight but was very unsteady.  She was groggy and seemed to have trouble with coordination and balance. She seems to be improving a little now.  She managed to eat her breakfast and is sitting upright in the bed with feet dangling without looking off balance at this time. She is more alert at this time as well.

## 2024-01-06 NOTE — Consult Note (Signed)
 NEUROLOGY CONSULT NOTE   Date of service: January 06, 2024 Patient Name: Lauren Chung MRN:  [id number] DOB:  08/06/1966 Chief Complaint: "Dizziness" Requesting Provider: Clydie Braun, MD  History of Present Illness  Lauren Chung is a 58 y.o. female with hx of lupus (on Plaquenil, prednisone and flourinef), seronegative RA, HTN, HLD, CVA approximately 20 years ago, hx of PE, on coumadin at home, anxiety and depression, CKD3a, PTSD, vision impairment, mood disorder , migraines, on xanax and ambien at home who presented 3/4 s/p 2 falls at home with head trauma and positive LOC, c/o worsening diplopia x 3 days. Labile Bps present, positive orthostatics with PT today.she received 1L bolus and her BP improved from as low as 76/44 to 109/68.   CT Head and C-spine negative. UDS positive for benzos, patient does have xanax on her home medication list.  On exam, patient has generalized weakness, diplopia and 7/10 headache that she states does not feel like her usual headache.  In chart review, she has a long history of dizzy spells, vertigo and migraines with dates as far back as 2017 listed with these types of symptoms. Was started on meclizine in 2017, but she has stated that it does not help with her dizziness, but does help to calm her down.She was also started on nortriptyline for migraines in 2017, this is not listed on her current home medication list.In previous lab work, B12, Folate, TSH, Ammonia have all been WNL, WBCs have been slightly elevated but not further signs of infections seen. No LPS are noted in previous workups. Patient states she had a LP "years ago in [place]".   Previous admissions: 12/22/23 for dizziness and fatigue  MRI with/wo negative  Negative LTM EEG. Keppra discontinued.   Negative vestibular evaluation by PT  Outpatient follow-up with her established neurologist recommend (Dr. Everlena Cooper) 12/13/23-12/18/23 syncopal episode [facility])  Started on Keppra by attending Routine  EEG showed suspicion of interictal activity, LTM was recommended, not completed.   MRI wit/wo negative  CTH negative ESR, CRP, Ammonia, TSH WNL UA + Possible polypharmacy noted, Xanax and Ambien at home.  05/2022 transient loss of vision left eye  MRI/MRA negative 03/2022 dizziness, blurry vision, slurred speech--resolved.   Diagnosed with TIA. MRI/MRA negative.   INR was subtherapeutic  Rheumatology follow-up recommended  ROS  Comprehensive ROS performed and pertinent positives documented in HPI   Past History   Past Medical History:  Diagnosis Date   Anemia    Anxiety    Arthritis    CKD (chronic kidney disease) stage 3, GFR 30-59 ml/min (HCC)    Depression    History of blood transfusion    in [place]   Hyperlipidemia    Hypertension    Lupus    PE (pulmonary embolism)    Pneumonia    x 3 - last time 2017   PTSD (post-traumatic stress disorder)    Renal disorder    Rheumatoid arthritis (HCC)    Seizures (HCC)    Stroke (HCC)    many years ago per patient, no deficits   SVD (spontaneous vaginal delivery)    x 3   TIA (transient ischemic attack)    many years ago per patient, no deficits    Past Surgical History:  Procedure Laterality Date   ABDOMINAL SURGERY     due internal bleeding   ABLATION     gyn procedure for bleeding   CYST EXCISION     top of head   EXCISION  MASS HEAD N/A 08/05/2019   Procedure: EXCISION OF SCALP CYST;  Surgeon: Serena Colonel, MD;  Location: [facility];  Service: ENT;  Laterality: N/A;    Family History: Family History  Problem Relation Age of Onset   Drug abuse Mother    Alcohol abuse Mother    Leukemia Mother    Leukemia Father    Seizures Daughter     Social History  reports that she has never smoked. She has never used smokeless tobacco. She reports that she does not drink alcohol and does not use drugs.  Allergies  Allergen Reactions   Acetaminophen Nausea And Vomiting   Belsomra [Suvorexant] Shortness Of  Breath and Rash   Codeine Shortness Of Breath   Iodinated Contrast Media Shortness Of Breath    Medications   Current Facility-Administered Medications:    0.9 %  sodium chloride infusion, , Intravenous, Continuous, Sundil, Subrina, MD, Last Rate: 100 mL/hr at 01/06/24 0420, New Bag at 01/06/24 0420   albuterol (PROVENTIL) (2.5 MG/3ML) 0.083% nebulizer solution 2.5 mg, 2.5 mg, Nebulization, Q6H PRN, Katrinka Blazing, Rondell A, MD   fludrocortisone (FLORINEF) tablet 0.05 mg, 0.05 mg, Oral, Daily, Smith, Rondell A, MD   hydrocortisone sodium succinate (SOLU-CORTEF) 100 MG injection 100 mg, 100 mg, Intravenous, Once, Smith, Rondell A, MD   hydroxychloroquine (PLAQUENIL) tablet 200 mg, 200 mg, Oral, Daily, Smith, Rondell A, MD   meclizine (ANTIVERT) tablet 25 mg, 25 mg, Oral, TID PRN, Katrinka Blazing, Rondell A, MD   ondansetron (ZOFRAN) tablet 4 mg, 4 mg, Oral, Q6H PRN **OR** ondansetron (ZOFRAN) injection 4 mg, 4 mg, Intravenous, Q6H PRN, Clydie Braun, MD   [START ON 01/07/2024] predniSONE (DELTASONE) tablet 5 mg, 5 mg, Oral, Q breakfast, Smith, Rondell A, MD   [START ON 01/07/2024] rosuvastatin (CRESTOR) tablet 10 mg, 10 mg, Oral, q AM, Smith, Rondell A, MD   sodium chloride flush (NS) 0.9 % injection 3 mL, 3 mL, Intravenous, Q12H, Smith, Rondell A, MD  Current Outpatient Medications:    ALPRAZolam (XANAX) 1 MG tablet, Take 1 tablet (1 mg total) by mouth 2 (two) times daily., Disp: 90 tablet, Rfl: 3   fludrocortisone (FLORINEF) 0.1 MG tablet, Take 0.5 tablets (0.05 mg total) by mouth daily., Disp: 30 tablet, Rfl: 1   hydroxychloroquine (PLAQUENIL) 200 MG tablet, Take 200 mg by mouth daily., Disp: , Rfl:    meclizine (ANTIVERT) 25 MG tablet, Take 1 tablet (25 mg total) by mouth 3 (three) times daily as needed for dizziness., Disp: 30 tablet, Rfl: 0   metoprolol succinate (TOPROL-XL) 25 MG 24 hr tablet, Take 25 mg by mouth every morning., Disp: , Rfl:    predniSONE (DELTASONE) 5 MG tablet, Take 1 tablet (5 mg  total) by mouth daily with breakfast., Disp: 30 tablet, Rfl: 3   rosuvastatin (CRESTOR) 10 MG tablet, Take 10 mg by mouth in the morning., Disp: , Rfl:    warfarin (COUMADIN) 4 MG tablet, Take 4 mg by mouth daily., Disp: , Rfl:    zolpidem (AMBIEN) 10 MG tablet, Take 1 tablet (10 mg total) by mouth at bedtime as needed for sleep., Disp: 30 tablet, Rfl: 3   alendronate (FOSAMAX) 70 MG tablet, Take 70 mg by mouth once a week. (Patient not taking: Reported on 12/31/2023), Disp: , Rfl:    docusate sodium (COLACE) 100 MG capsule, Take 1 capsule (100 mg total) by mouth 2 (two) times daily. (Patient not taking: Reported on 12/22/2023), Disp: 10 capsule, Rfl: 0  Vitals  Vitals:   01/06/24 0403 01/06/24 0700 01/06/24 1137 01/06/24 1210  BP:  109/68 (!) 83/59 111/73  Pulse:  65 66 68  Resp:  16 20 16   Temp: 97.8 F (36.6 C) 98 F (36.7 C) 98.2 F (36.8 C)   TempSrc:   Oral   SpO2:  97% 100% 100%  Weight:      Height:        Body mass index is 25.81 kg/m.  Physical Exam   Constitutional: Appears chronically weak. Psych: Affect appropriate to situation.  Eyes: No scleral injection.  HENT: No OP obstruction.  Head: Normocephalic.  Cardiovascular: Normal rate and regular rhythm.  Respiratory: Effort normal, non-labored breathing.  GI: Soft.  No distension. There is no tenderness.  Skin: WDI.   Neurologic Examination   Neuro: Mental Status: Patient is awake, alert, oriented to person, place, month, year, and situation. Patient is able to give a clear and coherent history.Follows all commands.  Naming and repetition intact.  She is soft-spoken. No signs of dysarthria, aphasia or neglect Cranial Nerves: II: Visual Fields are full. Pupils are equal, round, and reactive to light.   III,IV, VI: EOMI without ptosis. Shje does endorse diplopia  V: Facial sensation is symmetric to light touch VII: Facial movement is symmetric.  VIII: hearing is intact to voice X: Uvula elevates  symmetrically. Soft-spoken, but no hoarseness.  XI: Shoulder shrug is symmetric. XII: tongue is midline without atrophy or fasciculations.  Motor: Tone is normal. Bulk seems reduced.  Generalized weakness BUE: 4+/5 throughout with 4+/5 grips, no drift LLE: 4/5 knee and hips, 4+/5 dorsal/plantar, no drift RLE: 4+/5 throughout, no drift Sensory: Sensation is symmetric to light touch in the arms and legs. Cerebellar: FNF slow but intact bilaterally   Labs/Imaging/Neurodiagnostic studies   CBC:  Recent Labs  Lab 01/11/2024 2313  WBC 6.5  HGB 13.1  HCT 40.8  MCV 93.8  PLT 216   Basic Metabolic Panel:  Lab Results  Component Value Date   NA 136 2024-01-11   K 4.2 11-Jan-2024   CO2 26 01/11/2024   GLUCOSE 84 Jan 11, 2024   BUN 12 Jan 11, 2024   CREATININE 1.33 (H) 01-11-2024   CALCIUM 8.2 (L) 01/11/2024   GFRNONAA 47 (L) 2024-01-11   GFRAA 46 (L) 06/13/2020   Lipid Panel:  Lab Results  Component Value Date   LDLCALC 68 12/22/2023   HgbA1c:  Lab Results  Component Value Date   HGBA1C 4.8 12/22/2023   Urine Drug Screen:     Component Value Date/Time   LABOPIA NONE DETECTED 12/13/2023 1701   COCAINSCRNUR NONE DETECTED 12/13/2023 1701   LABBENZ POSITIVE (A) 12/13/2023 1701   AMPHETMU NONE DETECTED 12/13/2023 1701   THCU NONE DETECTED 12/13/2023 1701   LABBARB NONE DETECTED 12/13/2023 1701    Alcohol Level     Component Value Date/Time   ETH <10 03/05/2022 1554   INR  Lab Results  Component Value Date   INR 2.1 (H) 12/24/2023   APTT  Lab Results  Component Value Date   APTT 26 03/05/2022   AED levels: No results found for: "PHENYTOIN", "ZONISAMIDE", "LAMOTRIGINE", "LEVETIRACETA"  CT Head without contrast (Personally reviewed): No acute intracranial abnormality noted.   CT C-spine (Personally reviewed):  Mild degenerative change without acute abnormality.   12/22/2023 MRI Brain with and without (Personally reviewed): 1. No acute intracranial  abnormality. 2. Stable age advanced, nonspecific chronic cerebral white matter signal abnormality. 12/15/2023 MRI Brain with and without: 1. No evidence of acute  intracranial abnormality. 2. Moderate T2/FLAIR hyperintensity white matter, which are nonspecific but most commonly secondary to chronic microvascular ischemic change. Chronic demyelination is a differential consideration.  ASSESSMENT   Daziah Waskey is a 58 y.o. female with hx of lupus (on Plaquenil, prednisone and flourinef), seronegative RA, HTN, HLD, CVA approximately 20 years ago, hx of PE, on coumadin at home, anxiety and depression, CKD3a, PTSD, vision impairment, mood disorder , migraines, on xanax and ambien at home who presented 3/4 s/p 2 falls at home with head trauma and positive LOC, c/o worsening diplopia x 3 days.Labile BPS today with improvement after 1L bolus.  Generalized weakness on exam, no confusion.  Multiple recent workups for dizziness have been negative. Polypharmacy noted as possibility previously. No LP has been done in recent workups.   RECOMMENDATIONS   - Avoid sedation medications - Wean down Xanax instead of stopping abruptly to avoid withdrawal symptoms ______________________________________________________________________  Pt seen by Neuro NP/APP and later by MD. Note/plan to be edited by MD as needed.    Lauren January, DNP, AGACNP-BC [facility] Please use AMION for contact information & EPIC for messaging.  I have seen the patient and reviewed the above notes.  On my exam, she does endorse diplopia with a single eye covered, stating that when her right eye is covered the diplopia is worse and when the left eye is covered the diplopia is better but still present.  Bilateral monocular diplopia is very rarely organic in etiology.  She was very soft-spoken.  She does have a history of multiple rheumatological diseases, and therefore there could be consideration of CNS lupus  involvement, though the duration of the symptoms I think would be unlikely without lesions on MRI.  I do not feel strongly about an LP at this time, but could be a consideration.  I would favor initially working with PT and ensuring that she does not become orthostatic and seeing if her symptoms improve.  Neurology will follow.  Ritta Slot, MD [facility]   If 7pm- 7am, please page neurology on call as listed in AMION.

## 2024-01-06 NOTE — Hospital Course (Signed)
 Lauren Chung is a 58 y.o. 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  [name] while trying to get from bed to bathroom.  Patient was seen by neurology on 12/23/1993 while admitted EEG showed no evidence of seizure recommended DC the Keppra and as well as dizziness attributed to Keppra.  At presentation to ED patient found to be hypotensive blood pressure was 85/52 which improved to 101/56 after giving 1 L of LR bolus.  Otherwise hemodynamically stable. Normal lactic acid level. CMP showing evidence of AKI creatinine 1.3 and GFR 47. CBC unremarkable. Pending blood alcohol level, pro time INR. EKG showing sinus rhythm heart rate 87.  CT head no acute intracranial abnormality.  CT cervical spine mild degenerative change. Chest x-ray no active disease process.  Patient has history of recurrent fall in the setting of hypotension and vertigo as well as right-sided adduction weakness. Not exactly clinically relevant why patient has chronic hypotension.  On previous admission patient has cortisol checked which was normal. Discussed with Dr. Pilar Plate plan to continue NS 100 cc/h to avoid hypotension.  Hospitalist has been consulted for further management of syncopal episode in the context of hypotension, recurrent fall and AKI.

## 2024-01-06 NOTE — ED Notes (Signed)
 Pt is generally A&O but there is something off I can't put my finger on.  She can be off balance and uncoordinated at times seeming very lethargic and weak.. Other times she is moving around In the bed, messing wither her belongings.  She hasn't tried to get out of bed per say but she has been found with her legs between the rails, sometimes an arm and/or leg draped over the rail. She has been close to the end of the bed at least once.   Sometimes she seems to have good judgment, other times it is questionable.   Per PT she endorses some double vision x 3 days.  Her BP's have been labile, sometimes soft in the high 80's SBP but MAP has remained above 65.  Admitting doc is aware.  bolus didn't seem to make a big difference in labile BP.  She is very unstable when standing.

## 2024-01-06 NOTE — Evaluation (Signed)
 Physical Therapy Evaluation Patient Details Name: Lauren Chung MRN: [id number] DOB: 18-Oct-1966 Today's Date: 01/06/2024  History of Present Illness  Pt is 58 yo female presented on 3/4 after 2 falls at home with head trauma, hit the back of her head against the carpeted floor.  [name] while trying to get from the bed to the bathroom.  Reportedly soft blood pressure with EMS.  Admit on 12/22/23 with dizziness and fatigue with ADLs after recent admission 2/9-2/14/25 following syncopal episode. Pt noted epsiodes of significant dizziness and activity intolerance after returning home.PMH:  lupus, seronegative rheumatoid arthritis, HTN, HLD, anxiety and depression, CKD stage IIIa, PTSD, history of CVA, seizure disorder.  Clinical Impression  Pt admitted with above diagnosis. Pt with significant functional limitations with endurance, decr balance and lethargy affecting pt's ability to work with PT.  Could not assess vestibular system as pt with visual impairment and she couldn't perform tasks for vestibular assessment. OT to be consulted for visual assessment. Pt also with orthostatic BPs with ending BP 84/43 with HR 66 bpm. Notified MD of limited evaluation due to BP and visual issues.  Pt needing mod assist for bed mobility today.  Has some family support but also cares for autistic son.  Pt may benefit from post acute rehab < 3 hours day prior to d/c home if functional issues persist.  Pt currently with functional limitations due to the deficits listed below (see PT Problem List). Pt will benefit from acute skilled PT to increase their independence and safety with mobility to allow discharge.           If plan is discharge home, recommend the following: A little help with walking and/or transfers;A little help with bathing/dressing/bathroom;Assistance with cooking/housework;Help with stairs or ramp for entrance;Assist for transportation   Can travel by private vehicle   Yes    Equipment Recommendations  None recommended by PT  Recommendations for Other Services       Functional Status Assessment Patient has had a recent decline in their functional status and demonstrates the ability to make significant improvements in function in a reasonable and predictable amount of time.     Precautions / Restrictions Precautions Precautions: Fall Precaution/Restrictions Comments: syncope, dizziness Restrictions Weight Bearing Restrictions Per Provider Order: No      Mobility  Bed Mobility Overal bed mobility: Needs Assistance Bed Mobility: Supine to Sit, Sit to Supine     Supine to sit: Mod assist Sit to supine: Mod assist   General bed mobility comments: Needed mod assist as pt losing balance all directions but mostly posteriorly and to right    Transfers                   General transfer comment: Unable to stand as she couldnt sit without leaning to right and posteriorly as well as stretcher too far off ground to get pt to standing safely with only 1 assist.Pt also orthostatic.    Ambulation/Gait                  Stairs            Wheelchair Mobility     Tilt Bed    Modified Rankin (Stroke Patients Only) Modified Rankin (Stroke Patients Only) Pre-Morbid Rankin Score: Moderate disability Modified Rankin: Severe disability     Balance Overall balance assessment: Needs assistance Sitting-balance support: Feet supported, Single extremity supported, Bilateral upper extremity supported Sitting balance-Leahy Scale: Poor Sitting balance - Comments: Pt losing balance right and  posterior and needing her bil UEs and assist by PT at times.                                     Pertinent Vitals/Pain Pain Assessment Pain Assessment: Faces Faces Pain Scale: Hurts even more Pain Location: headache Pain Descriptors / Indicators: Tiring, Discomfort Pain Intervention(s): Limited activity within patient's tolerance, Monitored during session,  Repositioned    Home Living Family/patient expects to be discharged to:: Private residence Living Arrangements: Children;Other relatives Available Help at Discharge: Family;Available PRN/intermittently (daughter is in law school per pt and son is autistic and needs assist at times.) Type of Home: House Home Access: Stairs to enter   Secretary/administrator of Steps: 2 Alternate Level Stairs-Number of Steps: flight with landing Home Layout: Two level Home Equipment: Agricultural consultant (2 wheels) Additional Comments: pt has autistic  son for whom she  cares for, also 2 grandchildren and another daughter in the home    Prior Function Prior Level of Function : History of Falls (last six months);Needs assist             Mobility Comments: Has been using RW and struggling per pt with family assisting her.       Extremity/Trunk Assessment   Upper Extremity Assessment Upper Extremity Assessment: Defer to OT evaluation RUE Deficits / Details: h/o dislocation with fear of picking up over 90 degrees.    Lower Extremity Assessment Lower Extremity Assessment: Generalized weakness    Cervical / Trunk Assessment Cervical / Trunk Assessment: Kyphotic  Communication   Communication Communication: No apparent difficulties    Cognition Arousal: Lethargic Behavior During Therapy: WFL for tasks assessed/performed   PT - Cognitive impairments: Difficult to assess, Awareness, Attention, Sequencing, Problem solving, Safety/Judgement                       PT - Cognition Comments: Pt lethargic. Following commands: Impaired Following commands impaired: Follows one step commands inconsistently, Follows one step commands with increased time     Cueing Cueing Techniques: Verbal cues, Gestural cues, Tactile cues, Visual cues     General Comments General comments (skin integrity, edema, etc.): Supine BP 91/51, 67 bpm; sitting 103/62 with HR 68 bpm.  BP taken in 3 min after sitting,  84/43 with HR 66 bpm. Vestibular assessment attempted however pt would not follow PT's finger with her eyes.  When asked, "why aren't you following my finger," pt stated that she was following the finger even though her eyes stayed looking straight ahead.  Will ask for OT consult to assess vision.     Exercises General Exercises - Lower Extremity Ankle Circles/Pumps: AROM, Both, 10 reps, Supine Long Arc Quad: AROM, Both, 10 reps, Seated   Assessment/Plan    PT Assessment Patient needs continued PT services  PT Problem List Decreased strength;Decreased cognition;Decreased knowledge of precautions;Decreased mobility;Decreased activity tolerance;Decreased knowledge of use of DME;Pain;Decreased safety awareness       PT Treatment Interventions DME instruction;Functional mobility training;Balance training;Patient/family education;Therapeutic activities;Gait training;Stair training;Therapeutic exercise;Cognitive remediation    PT Goals (Current goals can be found in the Care Plan section)  Acute Rehab PT Goals Patient Stated Goal: improve activity tolerance PT Goal Formulation: With patient Time For Goal Achievement: 01/20/24 Potential to Achieve Goals: Good    Frequency Min 2X/week     Co-evaluation  AM-PAC PT "6 Clicks" Mobility  Outcome Measure Help needed turning from your back to your side while in a flat bed without using bedrails?: A Lot Help needed moving from lying on your back to sitting on the side of a flat bed without using bedrails?: A Lot Help needed moving to and from a bed to a chair (including a wheelchair)?: Total Help needed standing up from a chair using your arms (e.g., wheelchair or bedside chair)?: Total Help needed to walk in hospital room?: Total Help needed climbing 3-5 steps with a railing? : Total 6 Click Score: 8    End of Session Equipment Utilized During Treatment: Gait belt Activity Tolerance: Patient limited by fatigue;Patient  limited by lethargy Patient left: with call bell/phone within reach (on stretcher) Nurse Communication: Mobility status PT Visit Diagnosis: Unsteadiness on feet (R26.81);Difficulty in walking, not elsewhere classified (R26.2);Dizziness and giddiness (R42)    Time: 8295-6213 PT Time Calculation (min) (ACUTE ONLY): 31 min   Charges:   PT Evaluation $PT Eval Moderate Complexity: 1 Mod PT Treatments $Therapeutic Activity: 8-22 mins PT General Charges $$ ACUTE PT VISIT: 1 Visit         Lorrene Graef M,PT Acute Rehab Services [phone]   Bevelyn Buckles 01/06/2024, 1:45 PM

## 2024-01-06 NOTE — H&P (Signed)
 History and Physical    Patient: Lauren Chung YQM:[id number] DOB: 12-15-1965 DOA: 01/05/2024 DOS: the patient was seen and examined on 01/06/2024 PCP: Tally Joe, MD  Patient coming from: Home  Chief Complaint:  Chief Complaint  Patient presents with   fall on thinners   HPI: Lauren Chung is a 58 y.o. female with medical history significant of hypertension, hyperlipidemia, CVA with left-sided weakness, history of PE on anticoagulation with warfarin, SLE, rheumatoid arthritis, hypertension, hyperlipidemia, CKD stage IIIa, history of mood disorder who presents after having a multiple falls.  Recently admitted on 2/9- 2/14 for possible syncopal episode versus mimic with left-sided EEG abnormality and was started on Keppra who was referred to her PCP due to vertigo.  Patient readmitted into the hospital 2/17-2/20 again with complaints of vertigo with left-sided weakness.  MRI of the brain did not note any acute abnormality at that time.  Long-term monitoring EEG was performed and Keppra was discontinued.  Patient was recommended to continue meclizine and follow-up with Dr. Bobby Rumpf in 1 to 2 weeks.  After getting home patient reportedly declined per her daughter.  She has fallen several times and hit her head at least 3 times last night within an hour.  The patient's daughter next note that she is unsure if she has been taking her medications as she should.  Patient reportedly had been refusing to eat intermittently saying nonsensical things like acid in her grandson if he is in school although she knows he is 3 and not in school, and daughter reports poor memory.   On admission send emergency department patient was noted to be afebrile with blood pressures as low as 76/44 with improvement after 1 L normal saline IV fluids up to 109/68, and all other vital signs maintained.  Labs significant for creatinine 1.33, BUN 12, calcium 8.2, and INR 1.8.  CT scan of the head noted no acute abnormality with mild  to moderate chronic small vessel disease unchanged from prior.  Urinalysis noted trace leukocytes, negative nitrites, rare bacteria, 0-5 squamous epithelial cells, and 0-5 WBCs.  UDS positive for benzodiazepines.  Urine pregnancy screen was negative.  MRI brain with MRA head and neck noted no acute intercranial abnormality with moderate events scattered T2 flair hyperintensities in the white matter thought to be secondary to chronic microvascular disease.  Patient was given 1 L of normal saline IV fluids and Antivert 25 mg p.o.   Review of Systems: As mentioned in the history of present illness. All other systems reviewed and are negative. Past Medical History:  Diagnosis Date   Anemia    Anxiety    Arthritis    CKD (chronic kidney disease) stage 3, GFR 30-59 ml/min (HCC)    Depression    History of blood transfusion    in [place]   Hyperlipidemia    Hypertension    Lupus    PE (pulmonary embolism)    Pneumonia    x 3 - last time 2017   PTSD (post-traumatic stress disorder)    Renal disorder    Rheumatoid arthritis (HCC)    Seizures (HCC)    Stroke (HCC)    many years ago per patient, no deficits   SVD (spontaneous vaginal delivery)    x 3   TIA (transient ischemic attack)    many years ago per patient, no deficits   Past Surgical History:  Procedure Laterality Date   ABDOMINAL SURGERY     due internal bleeding   ABLATION  gyn procedure for bleeding   CYST EXCISION     top of head   EXCISION MASS HEAD N/A 08/05/2019   Procedure: EXCISION OF SCALP CYST;  Surgeon: Serena Colonel, MD;  Location:  [facility];  Service: ENT;  Laterality: N/A;   Social History:  reports that she has never smoked. She has never used smokeless tobacco. She reports that she does not drink alcohol and does not use drugs.  Allergies  Allergen Reactions   Acetaminophen Nausea And Vomiting   Belsomra [Suvorexant] Shortness Of Breath and Rash   Codeine Shortness Of Breath   Iodinated  Contrast Media Shortness Of Breath    Family History  Problem Relation Age of Onset   Drug abuse Mother    Alcohol abuse Mother    Leukemia Mother    Leukemia Father    Seizures Daughter     Prior to Admission medications   Medication Sig Start Date End Date Taking? Authorizing Provider  ALPRAZolam Prudy Feeler) 1 MG tablet Take 1 tablet (1 mg total) by mouth 2 (two) times daily. 12/29/23  Yes Plovsky, Earvin Hansen, MD  fludrocortisone (FLORINEF) 0.1 MG tablet Take 0.5 tablets (0.05 mg total) by mouth daily. 12/31/23  Yes Dohmeier, Porfirio Mylar, MD  hydroxychloroquine (PLAQUENIL) 200 MG tablet Take 200 mg by mouth daily. 02/21/22  Yes [provider]  meclizine (ANTIVERT) 25 MG tablet Take 1 tablet (25 mg total) by mouth 3 (three) times daily as needed for dizziness. 12/24/23  Yes Sheikh, Omair Latif, DO  metoprolol succinate (TOPROL-XL) 25 MG 24 hr tablet Take 25 mg by mouth every morning. 12/21/23  Yes [provider]  predniSONE (DELTASONE) 5 MG tablet Take 1 tablet (5 mg total) by mouth daily with breakfast. 12/18/23  Yes Sharl Ma, Sarina Ill, MD  rosuvastatin (CRESTOR) 10 MG tablet Take 10 mg by mouth in the morning.   Yes [provider]  warfarin (COUMADIN) 4 MG tablet Take 4 mg by mouth daily. 07/22/23  Yes [provider]  zolpidem (AMBIEN) 10 MG tablet Take 1 tablet (10 mg total) by mouth at bedtime as needed for sleep. 12/29/23 01/28/24 Yes Plovsky, Earvin Hansen, MD  alendronate (FOSAMAX) 70 MG tablet Take 70 mg by mouth once a week. Patient not taking: Reported on 12/31/2023 07/15/23   [provider]  docusate sodium (COLACE) 100 MG capsule Take 1 capsule (100 mg total) by mouth 2 (two) times daily. Patient not taking: Reported on 12/22/2023 12/18/23   Meredeth Ide, MD    Physical Exam: Vitals:   01/06/24 0158 01/06/24 0245 01/06/24 0403 01/06/24 0700  BP: 106/73 (!) 101/56  109/68  Pulse: 64 (!) 58  65  Resp: 16 17  16   Temp:   97.8 F (36.6 C) 98 F (36.7 C)   TempSrc:      SpO2: 96% 98%  97%  Weight:      Height:       Constitutional: NAD, calm, comfortable Eyes: PERRL, lids and conjunctivae normal ENMT: Mucous membranes are moist. Posterior pharynx clear of any exudate or lesions.Normal dentition.  Neck: normal, supple, no masses, no thyromegaly Respiratory: clear to auscultation bilaterally, no wheezing, no crackles. Normal respiratory effort. No accessory muscle use.  Cardiovascular: Regular rate and rhythm, no murmurs / rubs / gallops. No extremity edema. 2+ pedal pulses. No carotid bruits.  Abdomen: no tenderness, no masses palpated. No hepatosplenomegaly. Bowel sounds positive.  Musculoskeletal: no clubbing / cyanosis. No joint deformity upper and lower extremities. Good ROM, no contractures.  Normal muscle tone.  Skin: no rashes, lesions, ulcers. No induration Neurologic: CN 2-12 grossly intact.  Strength 4+/5 in the bilateral upper extremities and Psychiatric: Normal judgment and insight. Alert and oriented x 3. Normal mood.   Data Reviewed:  EKG revealed sinus rhythm at 67 bpm.  . Reviewed labs, imaging, and pertinent records as documented.  Assessment and Plan:  Recurrent falls Syncope Hypotension Vertigo Acute on chronic.  Patient presents with continued complaints of dizziness, weakness, and loss of consciousness with falls. Blood pressure initially noted to be as low as 76/45.  Patient had been given fluid bolus with temporary improvement in blood pressures.   TSH noted to be within normal limits at 1.58 checked on 12/22/2023.  Unclear if patient had been taking medications.  -Admit to a medical telemetry bed -Up with assistance -Check vitamin B12, ESR, CRP -Goal maps greater than 65 -Hydrocortisone 100 mg IV.  Resume fludrocortisone and prednisone once able -Continue IV fluids -Continue meclizine as needed -Physical therapy to evaluate -Neurology consulted, will follow-up for any further recommendations  History of  prior CVA with residual deficit Patient with prior history of CVA with mild left leg weakness.  History of pulmonary embolism on chronic anticoagulation -Heparin per pharmacy in case of need for possible LP  Rheumatoid arthritis SLE Patient on rituximab in outpatient setting -Continue Plaquenil and prednisone in a.m.  Anxiety -Decrease Xanax to 0.5 mg twice daily as needed  Chronic kidney disease stage IIIa Creatinine noted to be 1.33 with BUN 12.  Baseline creatinine appears to range from 1.06-1.3. -IV fluids -Continue to monitor kidney function     DVT prophylaxis: Heparin Advance Care Planning:   Code Status: Full Code   Consults: Neurology  Family Communication: Daughter updated over the phone  Severity of Illness: The appropriate patient status for this patient is OBSERVATION. Observation status is judged to be reasonable and necessary in order to provide the required intensity of service to ensure the patient's safety. The patient's presenting symptoms, physical exam findings, and initial radiographic and laboratory data in the context of their medical condition is felt to place them at decreased risk for further clinical deterioration. Furthermore, it is anticipated that the patient will be medically stable for discharge from the hospital within 2 midnights of admission.   Author: Clydie Braun, MD 01/06/2024 8:58 AM  For on call review [email or web address].

## 2024-01-07 DIAGNOSIS — I951 Orthostatic hypotension: Secondary | ICD-10-CM | POA: Diagnosis present

## 2024-01-07 DIAGNOSIS — W19XXXA Unspecified fall, initial encounter: Secondary | ICD-10-CM | POA: Diagnosis present

## 2024-01-07 DIAGNOSIS — D84821 Immunodeficiency due to drugs: Secondary | ICD-10-CM | POA: Diagnosis present

## 2024-01-07 DIAGNOSIS — E785 Hyperlipidemia, unspecified: Secondary | ICD-10-CM | POA: Diagnosis present

## 2024-01-07 DIAGNOSIS — F32A Depression, unspecified: Secondary | ICD-10-CM | POA: Diagnosis present

## 2024-01-07 DIAGNOSIS — G3184 Mild cognitive impairment, so stated: Secondary | ICD-10-CM | POA: Diagnosis not present

## 2024-01-07 DIAGNOSIS — I129 Hypertensive chronic kidney disease with stage 1 through stage 4 chronic kidney disease, or unspecified chronic kidney disease: Secondary | ICD-10-CM | POA: Diagnosis present

## 2024-01-07 DIAGNOSIS — E876 Hypokalemia: Secondary | ICD-10-CM | POA: Diagnosis present

## 2024-01-07 DIAGNOSIS — I69354 Hemiplegia and hemiparesis following cerebral infarction affecting left non-dominant side: Secondary | ICD-10-CM | POA: Diagnosis not present

## 2024-01-07 DIAGNOSIS — Z888 Allergy status to other drugs, medicaments and biological substances status: Secondary | ICD-10-CM | POA: Diagnosis not present

## 2024-01-07 DIAGNOSIS — E2740 Unspecified adrenocortical insufficiency: Secondary | ICD-10-CM | POA: Diagnosis not present

## 2024-01-07 DIAGNOSIS — G40909 Epilepsy, unspecified, not intractable, without status epilepticus: Secondary | ICD-10-CM | POA: Diagnosis present

## 2024-01-07 DIAGNOSIS — F4310 Post-traumatic stress disorder, unspecified: Secondary | ICD-10-CM | POA: Diagnosis present

## 2024-01-07 DIAGNOSIS — Z7983 Long term (current) use of bisphosphonates: Secondary | ICD-10-CM | POA: Diagnosis not present

## 2024-01-07 DIAGNOSIS — R42 Dizziness and giddiness: Secondary | ICD-10-CM | POA: Diagnosis not present

## 2024-01-07 DIAGNOSIS — N1832 Chronic kidney disease, stage 3b: Secondary | ICD-10-CM | POA: Diagnosis present

## 2024-01-07 DIAGNOSIS — Z91041 Radiographic dye allergy status: Secondary | ICD-10-CM | POA: Diagnosis not present

## 2024-01-07 DIAGNOSIS — M069 Rheumatoid arthritis, unspecified: Secondary | ICD-10-CM | POA: Diagnosis present

## 2024-01-07 DIAGNOSIS — G4700 Insomnia, unspecified: Secondary | ICD-10-CM | POA: Diagnosis present

## 2024-01-07 DIAGNOSIS — H547 Unspecified visual loss: Secondary | ICD-10-CM | POA: Diagnosis present

## 2024-01-07 DIAGNOSIS — Z7901 Long term (current) use of anticoagulants: Secondary | ICD-10-CM | POA: Diagnosis not present

## 2024-01-07 DIAGNOSIS — S0990XA Unspecified injury of head, initial encounter: Secondary | ICD-10-CM | POA: Diagnosis present

## 2024-01-07 DIAGNOSIS — N179 Acute kidney failure, unspecified: Secondary | ICD-10-CM | POA: Diagnosis present

## 2024-01-07 DIAGNOSIS — F419 Anxiety disorder, unspecified: Secondary | ICD-10-CM | POA: Diagnosis present

## 2024-01-07 DIAGNOSIS — G43909 Migraine, unspecified, not intractable, without status migrainosus: Secondary | ICD-10-CM | POA: Diagnosis present

## 2024-01-07 DIAGNOSIS — Z7960 Long term (current) use of unspecified immunomodulators and immunosuppressants: Secondary | ICD-10-CM | POA: Diagnosis not present

## 2024-01-07 DIAGNOSIS — Z86711 Personal history of pulmonary embolism: Secondary | ICD-10-CM | POA: Diagnosis not present

## 2024-01-07 DIAGNOSIS — R296 Repeated falls: Secondary | ICD-10-CM | POA: Diagnosis present

## 2024-01-07 LAB — CBC
HCT: 40.5 % (ref 36.0–46.0)
Hemoglobin: 13.2 g/dL (ref 12.0–15.0)
MCH: 29.7 pg (ref 26.0–34.0)
MCHC: 32.6 g/dL (ref 30.0–36.0)
MCV: 91.2 fL (ref 80.0–100.0)
Platelets: 223 10*3/uL (ref 150–400)
RBC: 4.44 MIL/uL (ref 3.87–5.11)
RDW: 13.2 % (ref 11.5–15.5)
WBC: 8.8 10*3/uL (ref 4.0–10.5)
nRBC: 0 % (ref 0.0–0.2)

## 2024-01-07 LAB — BASIC METABOLIC PANEL
Anion gap: 8 (ref 5–15)
BUN: 13 mg/dL (ref 6–20)
CO2: 22 mmol/L (ref 22–32)
Calcium: 8.2 mg/dL — ABNORMAL LOW (ref 8.9–10.3)
Chloride: 109 mmol/L (ref 98–111)
Creatinine, Ser: 1.18 mg/dL — ABNORMAL HIGH (ref 0.44–1.00)
GFR, Estimated: 54 mL/min — ABNORMAL LOW (ref >60)
Glucose, Bld: 77 mg/dL (ref 70–99)
Potassium: 4.1 mmol/L (ref 3.5–5.1)
Sodium: 139 mmol/L (ref 135–145)

## 2024-01-07 LAB — VITAMIN B12: Vitamin B-12: 882 pg/mL (ref 180–914)

## 2024-01-07 LAB — PROTIME-INR
INR: 1.5 — ABNORMAL HIGH (ref 0.8–1.2)
Prothrombin Time: 18.7 s — ABNORMAL HIGH (ref 11.4–15.2)

## 2024-01-07 LAB — HEPARIN LEVEL (UNFRACTIONATED)
Heparin Unfractionated: 0.92 [IU]/mL — ABNORMAL HIGH (ref 0.30–0.70)
Heparin Unfractionated: 0.66 [IU]/mL (ref 0.30–0.70)

## 2024-01-07 MED ORDER — WARFARIN - PHARMACIST DOSING INPATIENT: Freq: Every day | Status: DC

## 2024-01-07 MED ORDER — SODIUM CHLORIDE 0.9 % IV SOLN: INTRAVENOUS | Status: DC

## 2024-01-07 MED ORDER — WARFARIN SODIUM 6 MG PO TABS
6.0000 mg | ORAL_TABLET | Freq: Once | ORAL | Status: DC
  Filled 2024-01-07: qty 1

## 2024-01-07 NOTE — Progress Notes (Signed)
 NEUROLOGY CONSULT FOLLOW UP NOTE   Date of service: January 07, 2024 Patient Name: Lauren Chung MRN:  [id number] DOB:  1966-07-25  Interval Hx/subjective   She feels better than she did yesterday.  I got ancillary history from the daughter today, she has a history of vertigo in 2016, but none since that time.  At baseline, she is completely independent, cognitively intact, and does not have difficulty walking.  Over the past month, she has had a significant decline.  She was admitted with dizziness and was admitted, she had some improvement while in the hospital but then declined again after discharge.  Over the past few weeks, her cognitive status has been very impaired, forgetting things frequently throughout the day, unable to remember events from 1 day to the next.  She has been unstable, and does not remember that she needs to call for help to get up, and therefore has had difficulties with falls. Vitals   Vitals:   01/06/24 2039 01/07/24 0529 01/07/24 0737 01/07/24 1652  BP: 101/62 115/72 (!) 108/55 102/71  Pulse: 66 70 64 68  Resp: 18 18    Temp: 97.6 F (36.4 C) 97.6 F (36.4 C) 98 F (36.7 C)   TempSrc: Oral Oral    SpO2: 99% 100% 96%   Weight:      Height:         Body mass index is 25.81 kg/m.  Physical Exam   General: In bed, NAD  Neurologic Examination    MS: She is awake, alert, interactive and appropriate. CN: Extraocular movements are intact, face is symmetric.  She has blurred vision when covering the right eye, but when covering left eye her vision is clear today, no diplopia Motor: She gives less effort in the left arm than right, but she has no drift, at least 4+/5 Sensory: Intact to light touch  Medications  Current Facility-Administered Medications:    0.9 %  sodium chloride infusion, , Intravenous, Continuous, Vann, Jessica U, DO, Last Rate: 100 mL/hr at 01/07/24 1028, New Bag at 01/07/24 1028   albuterol (PROVENTIL) (2.5 MG/3ML) 0.083% nebulizer  solution 2.5 mg, 2.5 mg, Nebulization, Q6H PRN, Katrinka Blazing, Rondell A, MD   ALPRAZolam Prudy Feeler) tablet 0.5 mg, 0.5 mg, Oral, BID PRN, Katrinka Blazing, Rondell A, MD   feeding supplement (ENSURE ENLIVE / ENSURE PLUS) liquid 237 mL, 237 mL, Oral, BID BM, Smith, Rondell A, MD   fludrocortisone (FLORINEF) tablet 0.05 mg, 0.05 mg, Oral, Daily, Smith, Rondell A, MD, 0.05 mg at 01/07/24 0839   heparin ADULT infusion 100 units/mL (25000 units/243mL), 1,050 Units/hr, Intravenous, Continuous, Tamera Reason, RPH, Last Rate: 10.5 mL/hr at 01/07/24 1024, 1,050 Units/hr at 01/07/24 1024   hydroxychloroquine (PLAQUENIL) tablet 200 mg, 200 mg, Oral, Daily, Katrinka Blazing, Rondell A, MD, 200 mg at 01/07/24 9563   meclizine (ANTIVERT) tablet 25 mg, 25 mg, Oral, TID PRN, Katrinka Blazing, Rondell A, MD   ondansetron (ZOFRAN) tablet 4 mg, 4 mg, Oral, Q6H PRN **OR** ondansetron (ZOFRAN) injection 4 mg, 4 mg, Intravenous, Q6H PRN, Smith, Rondell A, MD   predniSONE (DELTASONE) tablet 5 mg, 5 mg, Oral, Q breakfast, Smith, Rondell A, MD, 5 mg at 01/07/24 0840   rosuvastatin (CRESTOR) tablet 10 mg, 10 mg, Oral, q AM, Smith, Rondell A, MD, 10 mg at 01/07/24 0840   sodium chloride flush (NS) 0.9 % injection 3 mL, 3 mL, Intravenous, Q12H, Smith, Rondell A, MD, 3 mL at 01/07/24 0844  Labs and Diagnostic Imaging    Imaging(Personally reviewed):  CT is negative  Assessment   Lauren Chung is a 58 y.o. female with a history of lupus and rheumatoid arthritis on chronic immunosuppression who presents with a 1 month history of worsening cognition, and unsteadiness.  She was found to have a low cortisol with her last hospitalization and adrenal insufficiency could conceivably present with dizziness (she was hypotensive) as well as cognitive dysfunction.  With her immune suppression, would also have to consider indolent infections and therefore I do think lumbar puncture needs to continue to be considered.  Her INR is still 1.5 today, and I would favor waiting for it  to be lower.  Recommendations  LP for protein, cells, glucose, IgG index, oligoclonal bands testing for adrenal insufficiency per internal medicine If no other etiology is identified, may consider trial of IVIG ______________________________________________________________________   Signed, Ritta Slot, MD [facility]

## 2024-01-07 NOTE — Evaluation (Signed)
 Occupational Therapy Evaluation Patient Details Name: Lauren Chung MRN: [id number] DOB: 10-May-1966 Today's Date: 01/07/2024   History of Present Illness   Pt is 58 yo female presented on 3/4 after 2 falls at home with head trauma, hit the back of her head against the carpeted floor.  [name] while trying to get from the bed to the bathroom.  Reportedly soft blood pressure with EMS.  Admit on 12/22/23 with dizziness and fatigue with ADLs after recent admission 2/9-2/14/25 following syncopal episode. Pt noted epsiodes of significant dizziness and activity intolerance after returning home.PMH:  lupus, seronegative rheumatoid arthritis, HTN, HLD, anxiety and depression, CKD stage IIIa, PTSD, history of CVA, seizure disorder.     Clinical Impressions Pt admitted for above, PTA pt noted to need assist from family wmin A to maintain static sitting balance +RW for mobility and reports being Mod I for ADLs. Pt cognition a bit scattered today, noted to have trouble with attention as well. Pt currently presents with end range nystagmus with R tracking, blurry vision and dizziness persists despite the use of lens taping and gaze stabilization exercises. She has poor sitting balance, swaying in all directions and needs Max A to Min A for ADLs. OT to continue following pt acutely to address listed deficits and help transition to next level of care. Patient would benefit from post acute skilled rehab facility with <3 hours of therapy and 24/7 support      If plan is discharge home, recommend the following:   Assistance with cooking/housework;Assist for transportation;A lot of help with walking and/or transfers;A lot of help with bathing/dressing/bathroom;Help with stairs or ramp for entrance     Functional Status Assessment   Patient has had a recent decline in their functional status and demonstrates the ability to make significant improvements in function in a reasonable and predictable amount of time.      Equipment Recommendations   Tub/shower bench     Recommendations for Other Services         Precautions/Restrictions   Precautions Precautions: Fall Precaution/Restrictions Comments: syncope. balance loss when standing, dizziness Restrictions Weight Bearing Restrictions Per Provider Order: No     Mobility Bed Mobility Overal bed mobility: Needs Assistance Bed Mobility: Supine to Sit, Sit to Supine     Supine to sit: Mod assist Sit to supine: Mod assist   General bed mobility comments: min A static sitting balance with multi directional swaying and LOB    Transfers                   General transfer comment: Pt with mutli directional swaying in sitting and poor control of balance, not fully safe for transfer attempt without +2 assist      Balance Overall balance assessment: Needs assistance Sitting-balance support: Feet supported, Single extremity supported, Bilateral upper extremity supported Sitting balance-Leahy Scale: Poor Sitting balance - Comments: multi directional swaying and LOBs, better with LUE support on rail but pt letting go of rails at times. Min A to correct Postural control: Other (comment)                                 ADL either performed or assessed with clinical judgement   ADL Overall ADL's : Needs assistance/impaired Eating/Feeding: Bed level;Set up   Grooming: Sitting;Minimal assistance Grooming Details (indicate cue type and reason): min A to maintain static sitting balance Upper Body Bathing: Sitting;Minimal assistance Upper Body Bathing  Details (indicate cue type and reason): min A to maintain static sitting balance Lower Body Bathing: Maximal assistance   Upper Body Dressing : Sitting;Moderate assistance   Lower Body Dressing: Sitting/lateral leans;Maximal assistance;Sit to/from Market researcher Details (indicate cue type and reason): deferred           General ADL Comments: Educated pt  on the use of habituation exercises to stabilize gaze on targets.     Vision Baseline Vision/History: 0 No visual deficits Patient Visual Report: Diplopia;Blurring of vision;Eye fatigue/eye pain/headache Vision Assessment?: Yes Eye Alignment: Within Functional Limits Ocular Range of Motion: Impaired-to be further tested in functional context;Other (comment) Alignment/Gaze Preference: Within Defined Limits Tracking/Visual Pursuits: Other (comment) Saccades: Decreased speed of saccadic movement Convergence: Within functional limits Diplopia Assessment: Present in near gaze;Present in far gaze Additional Comments: Upon visual assessment pt noted to have to have nystagmus at end range of L eye during R tracking. L eye also not fully tracking horizontally to the L. Pt with c/o diplopia which did not improve with the use of taping.     Perception Perception:  (to be further tested)       Praxis         Pertinent Vitals/Pain Pain Assessment Pain Assessment: Faces Faces Pain Scale: Hurts little more Pain Location: headache Pain Descriptors / Indicators: Tiring, Discomfort Pain Intervention(s): Monitored during session, Repositioned, Limited activity within patient's tolerance     Extremity/Trunk Assessment Upper Extremity Assessment Upper Extremity Assessment: Generalized weakness RUE Deficits / Details: h/o dislocation with fear of picking up over 90 degrees.   Lower Extremity Assessment Lower Extremity Assessment: Generalized weakness   Cervical / Trunk Assessment Cervical / Trunk Assessment: Kyphotic   Communication Communication Communication: No apparent difficulties   Cognition Arousal: Lethargic Behavior During Therapy: WFL for tasks assessed/performed Cognition: Cognition impaired   Orientation impairments: Time Awareness: Intellectual awareness impaired, Online awareness impaired   Attention impairment (select first level of impairment): Sustained  attention Executive functioning impairment (select all impairments): Reasoning, Problem solving                   Following commands: Impaired Following commands impaired: Follows one step commands inconsistently, Follows one step commands with increased time     Cueing  General Comments   Cueing Techniques: Verbal cues;Gestural cues;Tactile cues;Visual cues      Exercises     Shoulder Instructions      Home Living Family/patient expects to be discharged to:: Private residence Living Arrangements: Children;Other relatives Available Help at Discharge: Family;Available PRN/intermittently (daughter is in law school per pt and son is autistic and needs assist at times.) Type of Home: House Home Access: Stairs to enter Secretary/administrator of Steps: 2   Home Layout: Two level Alternate Level Stairs-Number of Steps: flight with landing Alternate Level Stairs-Rails: Right Bathroom Shower/Tub: Chief Strategy Officer: Standard     Home Equipment: Agricultural consultant (2 wheels)   Additional Comments: pt has autistic  son for whom she  cares for, also 2 grandchildren and another daughter in the home      Prior Functioning/Environment Prior Level of Function : History of Falls (last six months);Needs assist             Mobility Comments: Has been using RW and struggling per pt with family assisting her. ADLs Comments: mod I    OT Problem List: Decreased strength;Decreased activity tolerance;Impaired balance (sitting and/or standing);Decreased cognition;Impaired vision/perception   OT  Treatment/Interventions: Self-care/ADL training;Therapeutic activities;DME and/or AE instruction;Balance training;Patient/family education;Visual/perceptual remediation/compensation      OT Goals(Current goals can be found in the care plan section)   Acute Rehab OT Goals Patient Stated Goal: To get better OT Goal Formulation: With patient Time For Goal Achievement:  01/21/24 Potential to Achieve Goals: Good   OT Frequency:  Min 2X/week    Co-evaluation              AM-PAC OT "6 Clicks" Daily Activity     Outcome Measure Help from another person eating meals?: A Little Help from another person taking care of personal grooming?: A Little Help from another person toileting, which includes using toliet, bedpan, or urinal?: A Lot Help from another person bathing (including washing, rinsing, drying)?: A Lot Help from another person to put on and taking off regular upper body clothing?: A Lot Help from another person to put on and taking off regular lower body clothing?: A Lot 6 Click Score: 14   End of Session Nurse Communication: Mobility status;Precautions (orthostatic)  Activity Tolerance: Patient tolerated treatment well Patient left: in bed;with call bell/phone within reach;with bed alarm set;with family/visitor present  OT Visit Diagnosis: Unsteadiness on feet (R26.81);History of falling (Z91.81);Low vision, both eyes (H54.2)                Time: 6962-9528 OT Time Calculation (min): 34 min Charges:  OT General Charges $OT Visit: 1 Visit OT Evaluation $OT Eval Moderate Complexity: 1 Mod OT Treatments $Therapeutic Activity: 8-22 mins  01/07/2024  AB, OTR/L  Acute Rehabilitation Services  Office: [phone]   Tristan Schroeder 01/07/2024, 5:21 PM

## 2024-01-07 NOTE — [place] FL2 (Signed)
 [place] MEDICAID FL2 LEVEL OF CARE FORM     IDENTIFICATION  Patient Name: Lauren Chung Birthdate: [birth year] Sex: female Admission Date (Current Location): 01/05/2024  [facility] and [place] Number:  Producer, television/film/video and Address:  [facility] [place], [address], [place], [place] [address]      Provider Number: [id number]  Attending Physician Name and Address:  Joseph Art, DO  Relative Name and Phone Number:       Current Level of Care:   Recommended Level of Care: Skilled Nursing Facility Prior Approval Number:    Date Approved/Denied:   PASRR Number: [id number]  Discharge Plan: SNF    Current Diagnoses: Patient Active Problem List   Diagnosis Date Noted   Orthostatic hypotension 01/07/2024   Recurrent falls 01/06/2024   Syncope 01/06/2024   Hypotension 01/06/2024   Vertigo 12/22/2023   Stroke-like symptoms 12/22/2023   Hypersomnolence 12/13/2023   Abnormal urinalysis 12/13/2023   Seronegative rheumatoid arthritis (HCC) 12/13/2023   HLD (hyperlipidemia) 12/13/2023   Chronic kidney disease, stage 3a (HCC) 12/13/2023   Iron deficiency anemia 05/26/2022   Left-sided weakness 03/05/2022   Dysarthria 03/05/2022   Normocytic anemia 09/02/2018   Fever 09/01/2018   Sepsis (HCC) 11/16/2017   HTN (hypertension) 11/16/2017   History of CVA with residual deficit    History of pulmonary embolism    SLE (systemic lupus erythematosus) (HCC)    Anxiety and depression    AKI (acute kidney injury) (HCC)    Severe recurrent major depression without psychotic features (HCC) 09/30/2016    Orientation RESPIRATION BLADDER Height & Weight     Self, Place, Situation  Normal Continent Weight: 169 lb 12.1 oz (77 kg) Height:  5\' 8"  (172.7 cm)  BEHAVIORAL SYMPTOMS/MOOD NEUROLOGICAL BOWEL NUTRITION STATUS      Continent    AMBULATORY STATUS COMMUNICATION OF NEEDS Skin   Extensive Assist   Normal                       Personal Care  Assistance Level of Assistance  Bathing, Feeding, Dressing Bathing Assistance: Maximum assistance Feeding assistance: Limited assistance Dressing Assistance: Maximum assistance     Functional Limitations Info  Sight, Hearing, Speech Sight Info: Adequate Hearing Info: Adequate Speech Info: Adequate    SPECIAL CARE FACTORS FREQUENCY  PT (By licensed PT), OT (By licensed OT)                    Contractures Contractures Info: Not present    Additional Factors Info                  Current Medications (01/07/2024):  This is the current hospital active medication list Current Facility-Administered Medications  Medication Dose Route Frequency Provider Last Rate Last Admin   0.9 %  sodium chloride infusion   Intravenous Continuous Marlin Canary U, DO 100 mL/hr at 01/07/24 1028 New Bag at 01/07/24 1028   albuterol (PROVENTIL) (2.5 MG/3ML) 0.083% nebulizer solution 2.5 mg  2.5 mg Nebulization Q6H PRN Clydie Braun, MD       ALPRAZolam Prudy Feeler) tablet 0.5 mg  0.5 mg Oral BID PRN Madelyn Flavors A, MD       feeding supplement (ENSURE ENLIVE / ENSURE PLUS) liquid 237 mL  237 mL Oral BID BM Smith, Rondell A, MD       fludrocortisone (FLORINEF) tablet 0.05 mg  0.05 mg Oral Daily Smith, Rondell A, MD   0.05 mg  at 01/07/24 0839   heparin ADULT infusion 100 units/mL (25000 units/261mL)  1,050 Units/hr Intravenous Continuous Tamera Reason, RPH 10.5 mL/hr at 01/07/24 1024 1,050 Units/hr at 01/07/24 1024   hydroxychloroquine (PLAQUENIL) tablet 200 mg  200 mg Oral Daily Madelyn Flavors A, MD   200 mg at 01/07/24 1610   meclizine (ANTIVERT) tablet 25 mg  25 mg Oral TID PRN Clydie Braun, MD       ondansetron (ZOFRAN) tablet 4 mg  4 mg Oral Q6H PRN Madelyn Flavors A, MD       Or   ondansetron (ZOFRAN) injection 4 mg  4 mg Intravenous Q6H PRN Smith, Rondell A, MD       predniSONE (DELTASONE) tablet 5 mg  5 mg Oral Q breakfast Katrinka Blazing, Rondell A, MD   5 mg at 01/07/24 0840   rosuvastatin  (CRESTOR) tablet 10 mg  10 mg Oral q AM Madelyn Flavors A, MD   10 mg at 01/07/24 0840   sodium chloride flush (NS) 0.9 % injection 3 mL  3 mL Intravenous Q12H Madelyn Flavors A, MD   3 mL at 01/07/24 0844   Warfarin - Pharmacist Dosing Inpatient   Does not apply q1600 Tamera Reason, [facility]         Discharge Medications: Please see discharge summary for a list of discharge medications.  Relevant Imaging Results:  Relevant Lab Results:   Additional Information SS# [id number]  Deatra Robinson, [place]

## 2024-01-07 NOTE — Plan of Care (Signed)
   Problem: Education: Goal: Knowledge of General Education information will improve Description: Including pain rating scale, medication(s)/side effects and non-pharmacologic comfort measures Outcome: Completed/Met

## 2024-01-07 NOTE — Progress Notes (Addendum)
 PHARMACY - ANTICOAGULATION CONSULT NOTE  Pharmacy Consult for Heparin and restart Warfarin Indication: hx pulmonary embolus  Allergies  Allergen Reactions   Acetaminophen Nausea And Vomiting   Belsomra [Suvorexant] Shortness Of Breath and Rash   Codeine Shortness Of Breath   Iodinated Contrast Media Shortness Of Breath    Patient Measurements: Height: 5\' 8"  (172.7 cm) Weight: 77 kg (169 lb 12.1 oz) IBW/kg (Calculated) : 63.9 Heparin Dosing Weight: TBW  Vital Signs: Temp: 98 F (36.7 C) (03/06 0737) Temp Source: Oral (03/06 0529) BP: 108/55 (03/06 0737) Pulse Rate: 64 (03/06 0737)  Labs: Recent Labs    01/05/24 2313 01/06/24 1604 01/07/24 0833  HGB 13.1  --  13.2  HCT 40.8  --  40.5  PLT 216  --  223  LABPROT  --  19.2*  --   INR  --  1.6*  --   HEPARINUNFRC  --   --  0.92*  CREATININE 1.33*  --   --     Estimated Creatinine Clearance: 51.5 mL/min (A) (by C-G formula based on SCr of 1.33 mg/dL (H)).   Medical History: Past Medical History:  Diagnosis Date   Anemia    Anxiety    Arthritis    CKD (chronic kidney disease) stage 3, GFR 30-59 ml/min (HCC)    Depression    History of blood transfusion    in [place]   Hyperlipidemia    Hypertension    Lupus    PE (pulmonary embolism)    Pneumonia    x 3 - last time 2017   PTSD (post-traumatic stress disorder)    Renal disorder    Rheumatoid arthritis (HCC)    Seizures (HCC)    Stroke (HCC)    many years ago per patient, no deficits   SVD (spontaneous vaginal delivery)    x 3   TIA (transient ischemic attack)    many years ago per patient, no deficits    Medications:  Medications Prior to Admission  Medication Sig Dispense Refill Last Dose/Taking   ALPRAZolam (XANAX) 1 MG tablet Take 1 tablet (1 mg total) by mouth 2 (two) times daily. 90 tablet 3 01/04/2024   fludrocortisone (FLORINEF) 0.1 MG tablet Take 0.5 tablets (0.05 mg total) by mouth daily. 30 tablet 1 01/05/2024 Morning   hydroxychloroquine  (PLAQUENIL) 200 MG tablet Take 200 mg by mouth daily.   01/05/2024 Morning   meclizine (ANTIVERT) 25 MG tablet Take 1 tablet (25 mg total) by mouth 3 (three) times daily as needed for dizziness. 30 tablet 0 01/05/2024 Evening   metoprolol succinate (TOPROL-XL) 25 MG 24 hr tablet Take 25 mg by mouth every morning.   01/05/2024   predniSONE (DELTASONE) 5 MG tablet Take 1 tablet (5 mg total) by mouth daily with breakfast. 30 tablet 3 01/05/2024 Morning   rosuvastatin (CRESTOR) 10 MG tablet Take 10 mg by mouth in the morning.   01/05/2024 Morning   warfarin (COUMADIN) 4 MG tablet Take 4 mg by mouth daily.   01/05/2024 at  7:00 PM   zolpidem (AMBIEN) 10 MG tablet Take 1 tablet (10 mg total) by mouth at bedtime as needed for sleep. 30 tablet 3 Past Week   alendronate (FOSAMAX) 70 MG tablet Take 70 mg by mouth once a week. (Patient not taking: Reported on 12/31/2023)   Not Taking   docusate sodium (COLACE) 100 MG capsule Take 1 capsule (100 mg total) by mouth 2 (two) times daily. (Patient not taking: Reported on 12/22/2023) 10 capsule  0     Assessment: 63 yof who is admitted s/p falls at home with LOC; diplopia also noted. PMH includes SLE, RA, HTN, HLD, CVA / TIA, hx PE on warfarin PTA, anxiety / depression, CKD3a, PTSD, migraines, and vision impairment. Pharmacy consulted to begin IV heparin while warfarin is on hold for possible procedure.  CT head neg for bleed.  Warfarin PTA: 4 mg daily, last dose 3/4  INR was subtherapeutic at 1.6   therefore heparin infusion was started 3/5 PM.    On 3/6 AM , the heparin level is 0.92, supratherapeutic on heparin 1200 units/hr. CBC is WNL. No bleeding noted.   Will reduce heparin rate.   Dr. Benjamine Mola ordered to restart Warfarin today.     Goal of Therapy:  Heparin level 0.3-0.7 units/ml Monitor platelets by anticoagulation protocol: Yes   Plan:  Decrease heparin infusion to 1050 units/hr Check 6 hr heparin level Warfarin resuming today per Dr. Burna Sis orders PT/INR  today , lab running off of previous lab draw this AM.  Then will order a warfarin dose later today. Daily heparin level , PT/ INR and CBC Monitor for signs/symptoms of bleeding   Thank you for involving pharmacy in this patient's care. Noah Delaine, RPh Clinical Pharmacist 01/07/2024 9:58 AM  ADDENDUM:  INR = 1.5 , subtherapeutic  Last taken 01/05/24 prior to admission.  PTA dose 4mg  daily.  INR was 1.6, subtherapeutic on admission 3/4 PM.   PLAN:  Give Warfarin 6mg  today x1 F/u 6 hr HL tonight  F/u INR, HL, CBC in AM and daily  Noah Delaine, RPh Clinical Pharmacist 01/07/2024 11:08 AM

## 2024-01-07 NOTE — Progress Notes (Signed)
 PHARMACY - ANTICOAGULATION CONSULT NOTE  Pharmacy Consult for Heparin and restart Warfarin Indication: hx pulmonary embolus  Allergies  Allergen Reactions   Acetaminophen Nausea And Vomiting   Belsomra [Suvorexant] Shortness Of Breath and Rash   Codeine Shortness Of Breath   Iodinated Contrast Media Shortness Of Breath    Patient Measurements: Height: 5\' 8"  (172.7 cm) Weight: 77 kg (169 lb 12.1 oz) IBW/kg (Calculated) : 63.9 Heparin Dosing Weight: TBW  Vital Signs: BP: 102/71 (03/06 1652) Pulse Rate: 68 (03/06 1652)  Labs: Recent Labs    01/05/24 2313 01/06/24 1604 01/07/24 0833 01/07/24 1828  HGB 13.1  --  13.2  --   HCT 40.8  --  40.5  --   PLT 216  --  223  --   LABPROT  --  19.2* 18.7*  --   INR  --  1.6* 1.5*  --   HEPARINUNFRC  --   --  0.92* 0.66  CREATININE 1.33*  --  1.18*  --     Estimated Creatinine Clearance: 58.1 mL/min (A) (by C-G formula based on SCr of 1.18 mg/dL (H)).   Medical History: Past Medical History:  Diagnosis Date   Anemia    Anxiety    Arthritis    CKD (chronic kidney disease) stage 3, GFR 30-59 ml/min (HCC)    Depression    History of blood transfusion    in [place]   Hyperlipidemia    Hypertension    Lupus    PE (pulmonary embolism)    Pneumonia    x 3 - last time 2017   PTSD (post-traumatic stress disorder)    Renal disorder    Rheumatoid arthritis (HCC)    Seizures (HCC)    Stroke (HCC)    many years ago per patient, no deficits   SVD (spontaneous vaginal delivery)    x 3   TIA (transient ischemic attack)    many years ago per patient, no deficits    Medications:  Medications Prior to Admission  Medication Sig Dispense Refill Last Dose/Taking   ALPRAZolam (XANAX) 1 MG tablet Take 1 tablet (1 mg total) by mouth 2 (two) times daily. 90 tablet 3 01/04/2024   fludrocortisone (FLORINEF) 0.1 MG tablet Take 0.5 tablets (0.05 mg total) by mouth daily. 30 tablet 1 01/05/2024 Morning   hydroxychloroquine (PLAQUENIL) 200 MG tablet  Take 200 mg by mouth daily.   01/05/2024 Morning   meclizine (ANTIVERT) 25 MG tablet Take 1 tablet (25 mg total) by mouth 3 (three) times daily as needed for dizziness. 30 tablet 0 01/05/2024 Evening   metoprolol succinate (TOPROL-XL) 25 MG 24 hr tablet Take 25 mg by mouth every morning.   01/05/2024   predniSONE (DELTASONE) 5 MG tablet Take 1 tablet (5 mg total) by mouth daily with breakfast. 30 tablet 3 01/05/2024 Morning   rosuvastatin (CRESTOR) 10 MG tablet Take 10 mg by mouth in the morning.   01/05/2024 Morning   warfarin (COUMADIN) 4 MG tablet Take 4 mg by mouth daily.   01/05/2024 at  7:00 PM   zolpidem (AMBIEN) 10 MG tablet Take 1 tablet (10 mg total) by mouth at bedtime as needed for sleep. 30 tablet 3 Past Week   alendronate (FOSAMAX) 70 MG tablet Take 70 mg by mouth once a week. (Patient not taking: Reported on 12/31/2023)   Not Taking   docusate sodium (COLACE) 100 MG capsule Take 1 capsule (100 mg total) by mouth 2 (two) times daily. (Patient not taking: Reported on  12/22/2023) 10 capsule 0     Assessment: 82 yof who is admitted s/p falls at home with LOC; diplopia also noted. PMH includes SLE, RA, HTN, HLD, CVA / TIA, hx PE on warfarin PTA, anxiety / depression, CKD3a, PTSD, migraines, and vision impairment. Pharmacy consulted to begin IV heparin while warfarin is on hold for possible procedure.  CT head neg for bleed.  Warfarin PTA: 4 mg daily, last dose 3/4  INR was subtherapeutic at 1.6   therefore heparin infusion was started 3/5 PM.    Heparin level this evening is 0.66, within goal range.  No overt bleeding or complications noted.  Goal of Therapy:  Heparin level 0.3-0.7 units/ml Monitor platelets by anticoagulation protocol: Yes   Plan:  Continue IV heparin infusion at 1050 units/hr Daily heparin level , PT/ INR and CBC Monitor for signs/symptoms of bleeding  Jenetta Downer, BCCP Clinical Pharmacist  01/07/2024 7:46 PM   [facility] pharmacy phone numbers are listed on  [email or web address]

## 2024-01-07 NOTE — Progress Notes (Addendum)
 PROGRESS NOTE    Lauren Chung  WUJ:[id number] DOB: 03-10-1966 DOA: 01/05/2024 PCP: Tally Joe, MD    Brief Narrative:  Lauren Chung is a 58 y.o. 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  [name] while trying to get from bed to bathroom.  Hospitalist has been consulted for further management of syncopal episode in the context of hypotension, recurrent fall and AKI.        Assessment and Plan:  Recurrent falls Syncope Hypotension Vertigo Acute on chronic.  Patient presents with continued complaints of dizziness, weakness, and loss of consciousness with falls. Blood pressure initially noted to be as low as 76/45.  Patient had been given fluid bolus with temporary improvement in blood pressures.   TSH noted to be within normal limits at 1.58 checked on 12/22/2023.  Unclear if patient had been taking medications.  -Hydrocortisone 100 mg IV.  Resume fludrocortisone and prednisone once able -Continue IV fluids -Continue meclizine as needed -Physical therapy to evaluate -Neurology consulted -check AM cortisol   History of prior CVA with residual deficit Patient with prior history of CVA with mild left leg weakness.   History of pulmonary embolism on chronic anticoagulation -change back to coumadin   Rheumatoid arthritis SLE Patient on rituximab in outpatient setting -Continue Plaquenil and prednisone   Anxiety -Decrease Xanax to 0.5 mg twice daily as needed   Chronic kidney disease stage IIIa Creatinine noted to be 1.33 with BUN 12.  Baseline creatinine appears to range from 1.06-1.3. -IV fluids      DVT prophylaxis:     Code Status: Full Code Family  Communication:   Disposition Plan:  Level of care: Telemetry Medical Status is: Inpatient Remains inpatient appropriate    Consultants:  neurology   Subjective: No SOB, no CP  Objective: Vitals:   01/06/24 1520 01/06/24 2039 01/07/24 0529 01/07/24 0737  BP: 91/69 101/62 115/72 (!) 108/55  Pulse: 63 66 70 64  Resp: 18 18 18    Temp: 98.7 F (37.1 C) 97.6 F (36.4 C) 97.6 F (36.4 C) 98 F (36.7 C)  TempSrc: Oral Oral Oral   SpO2: 100% 99% 100% 96%  Weight:      Height:        Intake/Output Summary (Last 24 hours) at 01/07/2024 1142 Last data filed at 01/07/2024 0200 Gross per 24 hour  Intake 1667.97 ml  Output 400 ml  Net 1267.97 ml   Filed Weights   01/05/24 2313  Weight: 77 kg    Examination:   General: Appearance:     Overweight female in no acute distress     Lungs:     respirations unlabored  Heart:    Normal heart rate.    MS:   All extremities are intact.    Neurologic:   Awake, alert- confused to time       Data Reviewed: I have personally reviewed following labs and imaging studies  CBC: Recent Labs  Lab 01/05/24 2313 01/07/24 0833  WBC 6.5 8.8  HGB 13.1 13.2  HCT 40.8 40.5  MCV 93.8 91.2  PLT 216 223   Basic Metabolic Panel: Recent Labs  Lab 01/05/24 2313 01/07/24 0833  NA 136 139  K 4.2 4.1  CL 105 109  CO2 26 22  GLUCOSE 84 77  BUN 12 13  CREATININE 1.33* 1.18*  CALCIUM 8.2* 8.2*   GFR: Estimated Creatinine Clearance: 58.1 mL/min (A) (by C-G formula based on SCr of 1.18 mg/dL (H)). Liver Function Tests: Recent Labs  Lab 01/05/24 2313  AST 28  ALT 31  ALKPHOS 70  BILITOT 0.5  PROT 6.0*  ALBUMIN 3.2*   No results for input(s): "LIPASE", "AMYLASE" in the last 168 hours. No results for input(s): "AMMONIA" in the last 168 hours. Coagulation Profile: Recent Labs  Lab 01/06/24 1604 01/07/24 0833  INR 1.6* 1.5*   Cardiac Enzymes: No results for input(s): "CKTOTAL", "CKMB", "CKMBINDEX", "TROPONINI" in the last  168 hours. BNP (last 3 results) No results for input(s): "PROBNP" in the last 8760 hours. HbA1C: No results for input(s): "HGBA1C" in the last 72 hours. CBG: No results for input(s): "GLUCAP" in the last 168 hours. Lipid Profile: No results for input(s): "CHOL", "HDL", "LDLCALC", "TRIG", "CHOLHDL", "LDLDIRECT" in the last 72 hours. Thyroid Function Tests: No results for input(s): "TSH", "T4TOTAL", "FREET4", "T3FREE", "THYROIDAB" in the last 72 hours. Anemia Panel: No results for input(s): "VITAMINB12", "FOLATE", "FERRITIN", "TIBC", "IRON", "RETICCTPCT" in the last 72 hours. Sepsis Labs: Recent Labs  Lab 01/05/24 2346  LATICACIDVEN 1.0    Recent Results (from the past 240 hours)  Respiratory (~20 pathogens) panel by PCR     Status: None   Collection Time: 01/06/24  3:24 PM   Specimen: Nasopharyngeal Swab; Respiratory  Result Value Ref Range Status   Adenovirus NOT DETECTED NOT DETECTED Final   Coronavirus 229E NOT DETECTED NOT DETECTED Final    Comment: (NOTE) The Coronavirus on the Respiratory Panel, DOES NOT test for the novel  Coronavirus (2019 nCoV)    Coronavirus HKU1 NOT DETECTED NOT DETECTED Final   Coronavirus NL63 NOT DETECTED NOT DETECTED Final   Coronavirus OC43 NOT DETECTED NOT DETECTED Final   Metapneumovirus NOT DETECTED NOT DETECTED Final   Rhinovirus / Enterovirus NOT DETECTED NOT DETECTED Final   Influenza A NOT DETECTED NOT DETECTED Final   Influenza B NOT DETECTED NOT DETECTED Final   Parainfluenza Virus 1 NOT DETECTED NOT DETECTED Final   Parainfluenza Virus 2 NOT DETECTED NOT DETECTED Final   Parainfluenza Virus 3 NOT DETECTED NOT DETECTED Final   Parainfluenza Virus 4 NOT DETECTED NOT DETECTED Final   Respiratory Syncytial Virus NOT DETECTED NOT DETECTED Final   Bordetella pertussis NOT DETECTED NOT DETECTED Final   Bordetella Parapertussis NOT DETECTED NOT DETECTED Final   Chlamydophila pneumoniae NOT DETECTED NOT DETECTED Final   Mycoplasma  pneumoniae NOT DETECTED NOT DETECTED Final    Comment: Performed at [facility], [address], [place], [place] [address]  SARS Coronavirus 2 by RT PCR (hospital order, performed in [facility] hospital lab) *cepheid single result test* Nasopharyngeal Swab     Status: None   Collection Time: 01/06/24  3:48 PM   Specimen: Nasopharyngeal Swab; Nasal Swab  Result Value Ref Range Status   SARS Coronavirus 2 by RT PCR NEGATIVE NEGATIVE Final    Comment: Performed at [facility], [address], [place], [place] [address]         Radiology Studies: CT HEAD WO CONTRAST ( ) Result Date: 01/06/2024 CLINICAL DATA:  Recent fall with headaches and neck pain, initial encounter EXAM: CT HEAD WITHOUT CONTRAST CT CERVICAL SPINE WITHOUT CONTRAST TECHNIQUE: Multidetector CT imaging of the head and cervical spine was performed following the standard protocol without  intravenous contrast. Multiplanar CT image reconstructions of the cervical spine were also generated. RADIATION DOSE REDUCTION: This exam was performed according to the departmental dose-optimization program which includes automated exposure control, adjustment of the mA and/or kV according to patient size and/or use of iterative reconstruction technique. COMPARISON:  12/13/2023 FINDINGS: CT HEAD FINDINGS Brain: No evidence of acute infarction, hemorrhage, hydrocephalus, extra-axial collection or mass lesion/mass effect. Vascular: No hyperdense vessel or unexpected calcification. Skull: Normal. Negative for fracture or focal lesion. Sinuses/Orbits: No acute finding. Other: None. CT CERVICAL SPINE FINDINGS Alignment: Within normal limits. Skull base and vertebrae: Disc space narrowing is noted at C5-6 with mild osteophytic changes. Mild facet hypertrophic changes are noted. No acute fracture or acute facet abnormality is seen. The odontoid is within normal limits. Soft tissues and spinal canal: Surrounding soft tissue structures are within  normal limits. Upper chest: Visualized lung apices are unremarkable. Other: None IMPRESSION: CT of the head: No acute intracranial abnormality noted. CT of the cervical spine: Mild degenerative change without acute abnormality. Electronically Signed   By: Alcide Clever M.D.   On: 01/06/2024 01:16   CT CERVICAL SPINE WO CONTRAST Result Date: 01/06/2024 CLINICAL DATA:  Recent fall with headaches and neck pain, initial encounter EXAM: CT HEAD WITHOUT CONTRAST CT CERVICAL SPINE WITHOUT CONTRAST TECHNIQUE: Multidetector CT imaging of the head and cervical spine was performed following the standard protocol without intravenous contrast. Multiplanar CT image reconstructions of the cervical spine were also generated. RADIATION DOSE REDUCTION: This exam was performed according to the departmental dose-optimization program which includes automated exposure control, adjustment of the mA and/or kV according to patient size and/or use of iterative reconstruction technique. COMPARISON:  12/13/2023 FINDINGS: CT HEAD FINDINGS Brain: No evidence of acute infarction, hemorrhage, hydrocephalus, extra-axial collection or mass lesion/mass effect. Vascular: No hyperdense vessel or unexpected calcification. Skull: Normal. Negative for fracture or focal lesion. Sinuses/Orbits: No acute finding. Other: None. CT CERVICAL SPINE FINDINGS Alignment: Within normal limits. Skull base and vertebrae: Disc space narrowing is noted at C5-6 with mild osteophytic changes. Mild facet hypertrophic changes are noted. No acute fracture or acute facet abnormality is seen. The odontoid is within normal limits. Soft tissues and spinal canal: Surrounding soft tissue structures are within normal limits. Upper chest: Visualized lung apices are unremarkable. Other: None IMPRESSION: CT of the head: No acute intracranial abnormality noted. CT of the cervical spine: Mild degenerative change without acute abnormality. Electronically Signed   By: Alcide Clever M.D.    On: 01/06/2024 01:16   DG Chest Port 1 View Result Date: 01/06/2024 CLINICAL DATA:  Recent fall EXAM: PORTABLE CHEST 1 VIEW COMPARISON:  None Available. FINDINGS: The heart size and mediastinal contours are within normal limits. Both lungs are clear. The visualized skeletal structures are unremarkable. IMPRESSION: No active disease. Electronically Signed   By: Alcide Clever M.D.   On: 01/06/2024 01:02        Scheduled Meds:  feeding supplement  237 mL Oral BID BM   fludrocortisone  0.05 mg Oral Daily   hydroxychloroquine  200 mg Oral Daily   predniSONE  5 mg Oral Q breakfast   rosuvastatin  10 mg Oral q AM   sodium chloride flush  3 mL Intravenous Q12H   Warfarin - Pharmacist Dosing Inpatient   Does not apply q1600   Continuous Infusions:  sodium chloride 100 mL/hr at 01/07/24 1028   heparin 1,050 Units/hr (01/07/24 1024)     LOS: 0 days    Time spent:  45 minutes spent on chart review, discussion with nursing staff, consultants, updating family and interview/physical exam; more than 50% of that time was spent in counseling and/or coordination of care.    Joseph Art, DO [facility] Available via Epic secure chat 7am-7pm After these hours, please refer to coverage provider listed on [email or web address] 01/07/2024, 11:42 AM

## 2024-01-07 NOTE — TOC Initial Note (Signed)
 Transition of Care [facility]) - Initial/Assessment Note    Patient Details  Name: Lauren Chung MRN: [id number] Date of Birth: 12-13-1965  Transition of Care [facility]) CM/SW Contact:    Deatra Robinson, [place] Phone Number: 01/07/2024, 4:01 PM  Clinical Narrative:  Met with pt re PT recommendation for SNF. Pt from home with her dtr [name]. Explained SNF placement process and answered questions. Pt agreeable to consider SNF placement depending on available offers. Will begin SNF search and f/u with offers as available. Updated pt's dtr [name] at pt's request.   Dellie Burns, MSW, LCSW [phone] (coverage)                   Expected Discharge Plan: Skilled Nursing Facility Barriers to Discharge: Continued Medical Work up, SNF Pending bed offer, Insurance Authorization   Patient Goals and CMS Choice   CMS Medicare.gov Compare Post Acute Care list provided to:: Patient Choice offered to / list presented to : Patient [employer] ownership interest in [facility].provided to:: Patient    Expected Discharge Plan and Services     Post Acute Care Choice: Skilled Nursing Facility Living arrangements for the past 2 months: Single Family Home                                      Prior Living Arrangements/Services Living arrangements for the past 2 months: Single Family Home Lives with:: Adult Children Patient language and need for interpreter reviewed:: Yes        Need for Family Participation in Patient Care: Yes (Comment) Care giver support system in place?: Yes (comment) Current home services: DME (RW) Criminal Activity/Legal Involvement Pertinent to Current Situation/Hospitalization: No - Comment as needed  Activities of Daily Living   ADL Screening (condition at time of admission) Independently performs ADLs?: Yes (appropriate for developmental age) Is the patient deaf or have difficulty hearing?: No Does the patient have difficulty seeing, even when  wearing glasses/contacts?: No Does the patient have difficulty concentrating, remembering, or making decisions?: No  Permission Sought/Granted Permission sought to share information with : Facility Industrial/product designer granted to share information with : Yes, Verbal Permission Granted              Emotional Assessment       Orientation: : Oriented to Self, Oriented to Place, Fluctuating Orientation (Suspected and/or reported Sundowners), Oriented to Situation, Oriented to  Time Alcohol / Substance Use: Not Applicable Psych Involvement: No (comment)  Admission diagnosis:  Vertigo [R42] Dizzy [R42] Orthostatic hypotension [I95.1] Patient Active Problem List   Diagnosis Date Noted   Orthostatic hypotension 01/07/2024   Recurrent falls 01/06/2024   Syncope 01/06/2024   Hypotension 01/06/2024   Vertigo 12/22/2023   Stroke-like symptoms 12/22/2023   Hypersomnolence 12/13/2023   Abnormal urinalysis 12/13/2023   Seronegative rheumatoid arthritis (HCC) 12/13/2023   HLD (hyperlipidemia) 12/13/2023   Chronic kidney disease, stage 3a (HCC) 12/13/2023   Iron deficiency anemia 05/26/2022   Left-sided weakness 03/05/2022   Dysarthria 03/05/2022   Normocytic anemia 09/02/2018   Fever 09/01/2018   Sepsis (HCC) 11/16/2017   HTN (hypertension) 11/16/2017   History of CVA with residual deficit    History of pulmonary embolism    SLE (systemic lupus erythematosus) (HCC)    Anxiety and depression    AKI (acute kidney injury) (HCC)    Severe recurrent major depression without psychotic features (HCC) 09/30/2016  Class: Chronic   PCP:  Tally Joe, MD Pharmacy:   [facility] [address], [place] - [address] [place] [address] Phone: [phone] Fax: [phone]     Social Drivers of Health (SDOH) Social History: SDOH Screenings   Food Insecurity: Food Insecurity Present (01/06/2024)  Housing: Low  Risk  (01/06/2024)  Transportation Needs: No Transportation Needs (01/06/2024)  Utilities: At Risk (01/06/2024)  Social Connections: Socially Isolated (01/06/2024)  Tobacco Use: Low Risk  (01/06/2024)   SDOH Interventions:     Readmission Risk Interventions     No data to display

## 2024-01-08 ENCOUNTER — Inpatient Hospital Stay (HOSPITAL_COMMUNITY)

## 2024-01-08 DIAGNOSIS — Z7960 Long term (current) use of unspecified immunomodulators and immunosuppressants: Secondary | ICD-10-CM | POA: Diagnosis not present

## 2024-01-08 DIAGNOSIS — R42 Dizziness and giddiness: Secondary | ICD-10-CM | POA: Diagnosis not present

## 2024-01-08 LAB — CSF CELL COUNT WITH DIFFERENTIAL
RBC Count, CSF: 1 /mm3 — ABNORMAL HIGH
Tube #: 1
WBC, CSF: 2 /mm3 (ref 0–5)

## 2024-01-08 LAB — PROTIME-INR
INR: 1.4 — ABNORMAL HIGH (ref 0.8–1.2)
Prothrombin Time: 17.8 s — ABNORMAL HIGH (ref 11.4–15.2)

## 2024-01-08 LAB — CBC
HCT: 38.5 % (ref 36.0–46.0)
Hemoglobin: 12.7 g/dL (ref 12.0–15.0)
MCH: 30.2 pg (ref 26.0–34.0)
MCHC: 33 g/dL (ref 30.0–36.0)
MCV: 91.4 fL (ref 80.0–100.0)
Platelets: 220 10*3/uL (ref 150–400)
RBC: 4.21 MIL/uL (ref 3.87–5.11)
RDW: 13.3 % (ref 11.5–15.5)
WBC: 7.1 10*3/uL (ref 4.0–10.5)
nRBC: 0 % (ref 0.0–0.2)

## 2024-01-08 LAB — CORTISOL: Cortisol, Plasma: 1.7 ug/dL

## 2024-01-08 LAB — HEPARIN LEVEL (UNFRACTIONATED): Heparin Unfractionated: 0.84 [IU]/mL — ABNORMAL HIGH (ref 0.30–0.70)

## 2024-01-08 LAB — PROTEIN AND GLUCOSE, CSF
Glucose, CSF: 46 mg/dL (ref 40–70)
Total  Protein, CSF: 23 mg/dL (ref 15–45)

## 2024-01-08 MED ORDER — HEPARIN (PORCINE) 25000 UT/250ML-% IV SOLN
850.0000 [IU]/h | INTRAVENOUS | Status: DC
  Administered 2024-01-08: 900 [IU]/h via INTRAVENOUS
  Administered 2024-01-09: 1100 [IU]/h via INTRAVENOUS
  Administered 2024-01-10 – 2024-01-11 (×2): 850 [IU]/h via INTRAVENOUS
  Filled 2024-01-08 (×4): qty 250

## 2024-01-08 MED ORDER — LIDOCAINE 1 % OPTIME INJ - NO CHARGE
5.0000 mL | Freq: Once | INTRAMUSCULAR | Status: AC
  Administered 2024-01-08: 2.5 mL via INTRADERMAL

## 2024-01-08 MED ORDER — WARFARIN - PHARMACIST DOSING INPATIENT: Freq: Every day | Status: DC

## 2024-01-08 MED ORDER — WARFARIN SODIUM 5 MG PO TABS
5.0000 mg | ORAL_TABLET | Freq: Once | ORAL | Status: AC
  Administered 2024-01-08: 5 mg via ORAL
  Filled 2024-01-08: qty 1

## 2024-01-08 MED ORDER — COSYNTROPIN 0.25 MG IJ SOLR
0.2500 mg | Freq: Once | INTRAMUSCULAR | Status: DC
  Filled 2024-01-08: qty 0.25

## 2024-01-08 NOTE — Procedures (Signed)
 PROCEDURE SUMMARY:  Successful image-guided lumbar puncture at level of L2-L3.  Opening pressure was too lowe to be measured. Yielded 10 mL of clear, colorless CSF.  No immediate complications.  EBL = trace. Patient tolerated well.   Specimen was sent for labs.  Please see imaging section of Epic for full dictation.   Post procedure orders placed.  - Bedrest with bathroom privileges for 2 hours  - Remain flat in the bed for rest of the day   Mollie Rossano H Eulalah Rupert PA-C 01/08/2024 2:21 PM

## 2024-01-08 NOTE — Progress Notes (Addendum)
 PHARMACY - ANTICOAGULATION CONSULT NOTE  Pharmacy Consult for Heparin and restart Warfarin Indication: hx pulmonary embolus  Allergies  Allergen Reactions   Acetaminophen Nausea And Vomiting   Belsomra [Suvorexant] Shortness Of Breath and Rash   Codeine Shortness Of Breath   Iodinated Contrast Media Shortness Of Breath    Patient Measurements: Height: 5\' 8"  (172.7 cm) Weight: 77 kg (169 lb 12.1 oz) IBW/kg (Calculated) : 63.9 Heparin Dosing Weight: TBW  Vital Signs: Temp: 98.3 F (36.8 C) (03/07 0555) Temp Source: Oral (03/07 0555) BP: 112/65 (03/07 0740) Pulse Rate: 65 (03/07 0740)  Labs: Recent Labs    01/05/24 2313 01/06/24 1604 01/07/24 0833 01/07/24 1828 01/08/24 0615  HGB 13.1  --  13.2  --  12.7  HCT 40.8  --  40.5  --  38.5  PLT 216  --  223  --  220  LABPROT  --  19.2* 18.7*  --  17.8*  INR  --  1.6* 1.5*  --  1.4*  HEPARINUNFRC  --   --  0.92* 0.66 0.84*  CREATININE 1.33*  --  1.18*  --   --     Estimated Creatinine Clearance: 58.1 mL/min (A) (by C-G formula based on SCr of 1.18 mg/dL (H)).   Medical History: Past Medical History:  Diagnosis Date   Anemia    Anxiety    Arthritis    CKD (chronic kidney disease) stage 3, GFR 30-59 ml/min (HCC)    Depression    History of blood transfusion    in [place]   Hyperlipidemia    Hypertension    Lupus    PE (pulmonary embolism)    Pneumonia    x 3 - last time 2017   PTSD (post-traumatic stress disorder)    Renal disorder    Rheumatoid arthritis (HCC)    Seizures (HCC)    Stroke (HCC)    many years ago per patient, no deficits   SVD (spontaneous vaginal delivery)    x 3   TIA (transient ischemic attack)    many years ago per patient, no deficits    Medications:  Medications Prior to Admission  Medication Sig Dispense Refill Last Dose/Taking   ALPRAZolam (XANAX) 1 MG tablet Take 1 tablet (1 mg total) by mouth 2 (two) times daily. 90 tablet 3 01/04/2024   fludrocortisone (FLORINEF) 0.1 MG tablet Take  0.5 tablets (0.05 mg total) by mouth daily. 30 tablet 1 01/05/2024 Morning   hydroxychloroquine (PLAQUENIL) 200 MG tablet Take 200 mg by mouth daily.   01/05/2024 Morning   meclizine (ANTIVERT) 25 MG tablet Take 1 tablet (25 mg total) by mouth 3 (three) times daily as needed for dizziness. 30 tablet 0 01/05/2024 Evening   metoprolol succinate (TOPROL-XL) 25 MG 24 hr tablet Take 25 mg by mouth every morning.   01/05/2024   predniSONE (DELTASONE) 5 MG tablet Take 1 tablet (5 mg total) by mouth daily with breakfast. 30 tablet 3 01/05/2024 Morning   rosuvastatin (CRESTOR) 10 MG tablet Take 10 mg by mouth in the morning.   01/05/2024 Morning   warfarin (COUMADIN) 4 MG tablet Take 4 mg by mouth daily.   01/05/2024 at  7:00 PM   zolpidem (AMBIEN) 10 MG tablet Take 1 tablet (10 mg total) by mouth at bedtime as needed for sleep. 30 tablet 3 Past Week   alendronate (FOSAMAX) 70 MG tablet Take 70 mg by mouth once a week. (Patient not taking: Reported on 12/31/2023)   Not Taking  docusate sodium (COLACE) 100 MG capsule Take 1 capsule (100 mg total) by mouth 2 (two) times daily. (Patient not taking: Reported on 12/22/2023) 10 capsule 0     Assessment: 75 yof who is admitted s/p falls at home with LOC; diplopia also noted. PMH includes SLE, RA, HTN, HLD, CVA / TIA, hx PE on warfarin PTA, anxiety / depression, CKD3a, PTSD, migraines, and vision impairment. Pharmacy consulted to begin IV heparin while warfarin is on hold for possible procedure.  CT head neg for bleed.  Warfarin PTA: 4 mg daily, last dose 3/4  INR was subtherapeutic at 1.6   therefore heparin infusion was started 3/5 PM. INR down to 1.4 today, Coumadin on hold for LP.   Heparin level this morning is 0.84.  Per overnight RN report, patient had disconnected heparin IV tubing from the alaris pump and the line was clamped when she entered the room early this AM.  No overt bleeding or complications noted.  Goal of Therapy:  Heparin level 0.3-0.7 units/ml Monitor  platelets by anticoagulation protocol: Yes   Plan:  Reduce IV heparin to 900 units/hr. Check heparin level in 8 hrs. Daily heparin level , PT/ INR and CBC Monitor for signs/symptoms of bleeding  Reece Leader, Colon Flattery, [facility] Clinical Pharmacist  01/08/2024 8:37 AM   [facility] pharmacy phone numbers are listed on [email or web address]  Addendum:  Pharmacy asked by neurology PA to hold heparin in anticipation of LP this afternoon.  Will plan to resume IV heparin at 7 pm after procedure.  Check heparin level 8 hrs after gtt resumes.  Reece Leader, Colon Flattery, BCCP Clinical Pharmacist  01/08/2024 12:12 PM   [facility] pharmacy phone numbers are listed on [email or web address]

## 2024-01-08 NOTE — Progress Notes (Signed)
 PT Cancellation Note  Patient Details Name: Merlene Dante MRN: [id number] DOB: 1966-03-20   Cancelled Treatment:    Reason Eval/Treat Not Completed: Medical issues which prohibited therapy; patient just back from LP and now for bedrest remainder of the day.  Will follow up another day.   Elray Mcgregor 01/08/2024, 3:09 PM Sheran Lawless, PT Acute Rehabilitation Services Office:[phone] 01/08/2024

## 2024-01-08 NOTE — Progress Notes (Signed)
 PHARMACY - ANTICOAGULATION CONSULT NOTE  Pharmacy Consult for Heparin and restart Warfarin Indication: hx pulmonary embolus  Allergies  Allergen Reactions   Acetaminophen Nausea And Vomiting   Belsomra [Suvorexant] Shortness Of Breath and Rash   Codeine Shortness Of Breath   Iodinated Contrast Media Shortness Of Breath    Patient Measurements: Height: 5\' 8"  (172.7 cm) Weight: 77 kg (169 lb 12.1 oz) IBW/kg (Calculated) : 63.9 Heparin Dosing Weight: TBW  Vital Signs: Temp: 98.3 F (36.8 C) (03/07 0555) Temp Source: Oral (03/07 0555) BP: 112/65 (03/07 0740) Pulse Rate: 65 (03/07 0740)  Labs: Recent Labs    01/05/24 2313 01/06/24 1604 01/07/24 0833 01/07/24 1828 01/08/24 0615  HGB 13.1  --  13.2  --  12.7  HCT 40.8  --  40.5  --  38.5  PLT 216  --  223  --  220  LABPROT  --  19.2* 18.7*  --  17.8*  INR  --  1.6* 1.5*  --  1.4*  HEPARINUNFRC  --   --  0.92* 0.66 0.84*  CREATININE 1.33*  --  1.18*  --   --     Estimated Creatinine Clearance: 58.1 mL/min (A) (by C-G formula based on SCr of 1.18 mg/dL (H)).   Medical History: Past Medical History:  Diagnosis Date   Anemia    Anxiety    Arthritis    CKD (chronic kidney disease) stage 3, GFR 30-59 ml/min (HCC)    Depression    History of blood transfusion    in [place]   Hyperlipidemia    Hypertension    Lupus    PE (pulmonary embolism)    Pneumonia    x 3 - last time 2017   PTSD (post-traumatic stress disorder)    Renal disorder    Rheumatoid arthritis (HCC)    Seizures (HCC)    Stroke (HCC)    many years ago per patient, no deficits   SVD (spontaneous vaginal delivery)    x 3   TIA (transient ischemic attack)    many years ago per patient, no deficits    Medications:  Medications Prior to Admission  Medication Sig Dispense Refill Last Dose/Taking   ALPRAZolam (XANAX) 1 MG tablet Take 1 tablet (1 mg total) by mouth 2 (two) times daily. 90 tablet 3 01/04/2024   fludrocortisone (FLORINEF) 0.1 MG tablet Take  0.5 tablets (0.05 mg total) by mouth daily. 30 tablet 1 01/05/2024 Morning   hydroxychloroquine (PLAQUENIL) 200 MG tablet Take 200 mg by mouth daily.   01/05/2024 Morning   meclizine (ANTIVERT) 25 MG tablet Take 1 tablet (25 mg total) by mouth 3 (three) times daily as needed for dizziness. 30 tablet 0 01/05/2024 Evening   metoprolol succinate (TOPROL-XL) 25 MG 24 hr tablet Take 25 mg by mouth every morning.   01/05/2024   predniSONE (DELTASONE) 5 MG tablet Take 1 tablet (5 mg total) by mouth daily with breakfast. 30 tablet 3 01/05/2024 Morning   rosuvastatin (CRESTOR) 10 MG tablet Take 10 mg by mouth in the morning.   01/05/2024 Morning   warfarin (COUMADIN) 4 MG tablet Take 4 mg by mouth daily.   01/05/2024 at  7:00 PM   zolpidem (AMBIEN) 10 MG tablet Take 1 tablet (10 mg total) by mouth at bedtime as needed for sleep. 30 tablet 3 Past Week   alendronate (FOSAMAX) 70 MG tablet Take 70 mg by mouth once a week. (Patient not taking: Reported on 12/31/2023)   Not Taking  docusate sodium (COLACE) 100 MG capsule Take 1 capsule (100 mg total) by mouth 2 (two) times daily. (Patient not taking: Reported on 12/22/2023) 10 capsule 0     Assessment: 16 yof who is admitted s/p falls at home with LOC; diplopia also noted. PMH includes SLE, RA, HTN, HLD, CVA / TIA, hx PE on warfarin PTA, anxiety / depression, CKD3a, PTSD, migraines, and vision impairment. Pharmacy consulted to begin IV heparin while warfarin is on hold for possible procedure.  CT head neg for bleed.  Heparin level this morning is 0.84.  Per overnight RN report, patient had disconnected heparin IV tubing from the alaris pump and the line was clamped when she entered the room early this AM.  No overt bleeding or complications noted. Around noon, Pharmacy was asked by neurology PA to hold heparin in anticipation of LP this afternoon.  Will plan to resume IV heparin at 7 pm after procedure.  Check heparin level 8 hrs after gtt resumes.  Warfarin PTA: 4 mg daily,  last dose 3/4  INR 1.4, ok to restart warfarin per team.   Goal of Therapy:  Heparin level 0.3-0.7 units/ml Monitor platelets by anticoagulation protocol: Yes   Plan:  Restart IV heparin 900 units/hr at 19:00 F/u heparin level in 8 hrs Warfarin 5 mg x1 Daily heparin level , PT/ INR and CBC Monitor for signs/symptoms of bleeding  Alphia Moh, PharmD, BCPS, BCCP Clinical Pharmacist  Please check AMION for all [facility] Pharmacy phone numbers After 10:00 PM, call Main Pharmacy [phone]

## 2024-01-08 NOTE — Progress Notes (Signed)
 PROGRESS NOTE    Lauren Chung  ZHY:[id number] DOB: 1966/04/26 DOA: 01/05/2024 PCP: Tally Joe, MD    Brief Narrative:  Lauren Chung is a 58 y.o. 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  [name] while trying to get from bed to bathroom.  Hospitalist has been consulted for further management of syncopal episode in the context of hypotension, recurrent fall and AKI.  Of note she was prescribed florinef on 2/27 by neurologist but she never picked up       Assessment and Plan:  Recurrent falls/Syncope Hypotension/Vertigo Acute on chronic.  Patient presents with continued complaints of dizziness, weakness, and loss of consciousness with falls. Blood pressure initially noted to be as low as 76/45.  Patient had been given fluid bolus with temporary improvement in blood pressures.   TSH noted to be within normal limits at 1.58 checked on 12/22/2023.  Unclear if patient had been taking medications.  -Hydrocortisone 100 mg IV in ER-- check stim test (never picked up florinef) -Continue IV fluids -Continue meclizine as needed -Physical therapy to evaluate -Neurology consulted -LP pending   History of prior CVA with residual deficit Patient with prior history of CVA with mild left leg weakness.   History of pulmonary embolism on chronic anticoagulation -change back to coumadin once LP done   Rheumatoid arthritis SLE Patient on rituximab in outpatient setting -Continue Plaquenil and prednisone   Anxiety -Decrease Xanax to 0.5 mg twice daily as needed   Chronic kidney disease stage IIIa Creatinine noted to be 1.33 with BUN 12.  Baseline creatinine appears to range  from 1.06-1.3. -IV fluids      DVT prophylaxis:     Code Status: Full Code   Disposition Plan:  Level of care: Telemetry Medical Status is: Inpatient Remains inpatient appropriate    Consultants:  neurology   Subjective: confused  Objective: Vitals:   01/07/24 1652 01/07/24 2031 01/08/24 0555 01/08/24 0740  BP: 102/71 117/75 121/72 112/65  Pulse: 68 75 64 65  Resp:  18 18   Temp:  98.4 F (36.9 C) 98.3 F (36.8 C)   TempSrc:   Oral   SpO2:  99% 98% 100%  Weight:      Height:        Intake/Output Summary (Last 24 hours) at 01/08/2024 1325 Last data filed at 01/08/2024 0643 Gross per 24 hour  Intake 2378.59 ml  Output 702 ml  Net 1676.59 ml   Filed Weights   01/05/24 2313  Weight: 77 kg    Examination:    General: Appearance:     Overweight female in no acute distress     Lungs:      respirations unlabored  Heart:    Normal heart rate. .   MS:   All extremities are intact.   Neurologic:   Awake, alert- -confused-- ripped up her menu      Data Reviewed: I have personally reviewed following labs and imaging studies  CBC: Recent Labs  Lab 01/05/24 2313 01/07/24 0833 01/08/24 0615  WBC 6.5 8.8 7.1  HGB 13.1 13.2 12.7  HCT 40.8 40.5 38.5  MCV 93.8 91.2 91.4  PLT 216 223 220   Basic Metabolic Panel: Recent Labs  Lab 01/05/24 2313 01/07/24  0833  NA 136 139  K 4.2 4.1  CL 105 109  CO2 26 22  GLUCOSE 84 77  BUN 12 13  CREATININE 1.33* 1.18*  CALCIUM 8.2* 8.2*   GFR: Estimated Creatinine Clearance: 58.1 mL/min (A) (by C-G formula based on SCr of 1.18 mg/dL (H)). Liver Function Tests: Recent Labs  Lab 01/05/24 2313  AST 28  ALT 31  ALKPHOS 70  BILITOT 0.5  PROT 6.0*  ALBUMIN 3.2*   No results for input(s): "LIPASE", "AMYLASE" in the last 168 hours. No results for input(s): "AMMONIA" in the last 168 hours. Coagulation Profile: Recent Labs  Lab 01/06/24 1604 01/07/24 0833 01/08/24 0615  INR 1.6* 1.5* 1.4*   Cardiac  Enzymes: No results for input(s): "CKTOTAL", "CKMB", "CKMBINDEX", "TROPONINI" in the last 168 hours. BNP (last 3 results) No results for input(s): "PROBNP" in the last 8760 hours. HbA1C: No results for input(s): "HGBA1C" in the last 72 hours. CBG: No results for input(s): "GLUCAP" in the last 168 hours. Lipid Profile: No results for input(s): "CHOL", "HDL", "LDLCALC", "TRIG", "CHOLHDL", "LDLDIRECT" in the last 72 hours. Thyroid Function Tests: No results for input(s): "TSH", "T4TOTAL", "FREET4", "T3FREE", "THYROIDAB" in the last 72 hours. Anemia Panel: Recent Labs    01/07/24 1828  VITAMINB12 882   Sepsis Labs: Recent Labs  Lab 01/05/24 2346  LATICACIDVEN 1.0    Recent Results (from the past 240 hours)  Respiratory (~20 pathogens) panel by PCR     Status: None   Collection Time: 01/06/24  3:24 PM   Specimen: Nasopharyngeal Swab; Respiratory  Result Value Ref Range Status   Adenovirus NOT DETECTED NOT DETECTED Final   Coronavirus 229E NOT DETECTED NOT DETECTED Final    Comment: (NOTE) The Coronavirus on the Respiratory Panel, DOES NOT test for the novel  Coronavirus (2019 nCoV)    Coronavirus HKU1 NOT DETECTED NOT DETECTED Final   Coronavirus NL63 NOT DETECTED NOT DETECTED Final   Coronavirus OC43 NOT DETECTED NOT DETECTED Final   Metapneumovirus NOT DETECTED NOT DETECTED Final   Rhinovirus / Enterovirus NOT DETECTED NOT DETECTED Final   Influenza A NOT DETECTED NOT DETECTED Final   Influenza B NOT DETECTED NOT DETECTED Final   Parainfluenza Virus 1 NOT DETECTED NOT DETECTED Final   Parainfluenza Virus 2 NOT DETECTED NOT DETECTED Final   Parainfluenza Virus 3 NOT DETECTED NOT DETECTED Final   Parainfluenza Virus 4 NOT DETECTED NOT DETECTED Final   Respiratory Syncytial Virus NOT DETECTED NOT DETECTED Final   Bordetella pertussis NOT DETECTED NOT DETECTED Final   Bordetella Parapertussis NOT DETECTED NOT DETECTED Final   Chlamydophila pneumoniae NOT DETECTED NOT  DETECTED Final   Mycoplasma pneumoniae NOT DETECTED NOT DETECTED Final    Comment: Performed at [facility], [address], [place], [place] [address]  SARS Coronavirus 2 by RT PCR (hospital order, performed in [facility] hospital lab) *cepheid single result test* Nasopharyngeal Swab     Status: None   Collection Time: 01/06/24  3:48 PM   Specimen: Nasopharyngeal Swab; Nasal Swab  Result Value Ref Range Status   SARS Coronavirus 2 by RT PCR NEGATIVE NEGATIVE Final    Comment: Performed at [facility], [address] [place], [place] [address]         Radiology Studies: No results found.       Scheduled Meds:  [START ON 01/09/2024] cosyntropin  0.25 mg Intravenous Once   feeding supplement  237 mL Oral BID BM  hydroxychloroquine  200 mg Oral Daily   predniSONE  5 mg Oral Q breakfast   rosuvastatin  10 mg Oral q AM   sodium chloride flush  3 mL Intravenous Q12H   Continuous Infusions:  heparin       LOS: 1 day    Time spent: 45 minutes spent on chart review, discussion with nursing staff, consultants, updating family and interview/physical exam; more than 50% of that time was spent in counseling and/or coordination of care.    Joseph Art, DO [facility] Available via Epic secure chat 7am-7pm After these hours, please refer to coverage provider listed on [email or web address] 01/08/2024, 1:25 PM

## 2024-01-08 NOTE — Progress Notes (Signed)
 NEUROLOGY CONSULT FOLLOW UP NOTE   Date of service: January 08, 2024 Patient Name: Lauren Chung MRN:  [id number] DOB:  [birth year]  Interval Hx/subjective   No family at the bedside. Patient states she feels weak today. Vision remains the same. She states she is not really sleeping well nor eating due to having no appetite.  Neurological exam is unchanged today.   Vitals   Vitals:   01/07/24 1652 01/07/24 2031 01/08/24 0555 01/08/24 0740  BP: 102/71 117/75 121/72 112/65  Pulse: 68 75 64 65  Resp:  18 18   Temp:  98.4 F (36.9 C) 98.3 F (36.8 C)   TempSrc:   Oral   SpO2:  99% 98% 100%  Weight:      Height:         Body mass index is 25.81 kg/m.  Physical Exam   Constitutional: Appears well-developed and well-nourished.  Psych: Affect appropriate to situation.  Eyes: No scleral injection.  HENT: No OP obstrucion.  Head: Normocephalic.  Cardiovascular: Normal rate and regular rhythm.  Respiratory: Effort normal, non-labored breathing.  GI: Soft.  No distension. There is no tenderness.  Skin: WDI.   Neurologic Examination  Mental Status -  Level of arousal and orientation to time, place, and person were intact. Language including expression, naming, repetition, comprehension was assessed and found intact.  Cranial Nerves II - XII - II - Visual field intact OU. She has blurred vision in both eyes, no double vision  III, IV, VI - Extraocular movements intact. V - Facial sensation intact bilaterally. VII - Facial movement intact bilaterally. VIII - Hearing & vestibular intact bilaterally. X - Palate elevates symmetrically. XI - Chin turning & shoulder shrug intact bilaterally. XII - Tongue protrusion intact.  Motor Strength - The patient's strength was normal in all extremities and pronator drift was absent.  Bulk was normal and fasciculations were absent.   Motor Tone - Muscle tone was assessed at the neck and appendages and was normal. Sensory - Light touch,  temperature/pinprick were assessed and were symmetrical.   Coordination - The patient had normal movements in the hands and feet with no ataxia or dysmetria.  Tremor was absent. Gait and Station - deferred.  Medications  Current Facility-Administered Medications:    0.9 %  sodium chloride infusion, , Intravenous, Continuous, Joseph Art, DO, Last Rate: 100 mL/hr at 01/08/24 0749, New Bag at 01/08/24 0749   albuterol (PROVENTIL) (2.5 MG/3ML) 0.083% nebulizer solution 2.5 mg, 2.5 mg, Nebulization, Q6H PRN, Katrinka Blazing, Rondell A, MD   ALPRAZolam Prudy Feeler) tablet 0.5 mg, 0.5 mg, Oral, BID PRN, Clydie Braun, MD   [START ON 01/09/2024] cosyntropin (CORTROSYN) injection 0.25 mg, 0.25 mg, Intravenous, Once, Vann, Jessica U, DO   feeding supplement (ENSURE ENLIVE / ENSURE PLUS) liquid 237 mL, 237 mL, Oral, BID BM, Smith, Rondell A, MD   heparin ADULT infusion 100 units/mL (25000 units/22mL), 900 Units/hr, Intravenous, Continuous, Carney, Gwenlyn Found, RPH, Last Rate: 9 mL/hr at 01/08/24 0852, 900 Units/hr at 01/08/24 0865   hydroxychloroquine (PLAQUENIL) tablet 200 mg, 200 mg, Oral, Daily, Katrinka Blazing, Rondell A, MD, 200 mg at 01/08/24 7846   meclizine (ANTIVERT) tablet 25 mg, 25 mg, Oral, TID PRN, Katrinka Blazing, Rondell A, MD   ondansetron (ZOFRAN) tablet 4 mg, 4 mg, Oral, Q6H PRN **OR** ondansetron (ZOFRAN) injection 4 mg, 4 mg, Intravenous, Q6H PRN, Smith, Rondell A, MD   predniSONE (DELTASONE) tablet 5 mg, 5 mg, Oral, Q breakfast, Smith, Rondell A, MD, 5 mg  at 01/08/24 0852   rosuvastatin (CRESTOR) tablet 10 mg, 10 mg, Oral, q AM, Katrinka Blazing, Rondell A, MD, 10 mg at 01/08/24 1610   sodium chloride flush (NS) 0.9 % injection 3 mL, 3 mL, Intravenous, Q12H, Madelyn Flavors A, MD, 3 mL at 01/08/24 0853  Labs and Diagnostic Imaging   CBC:  Recent Labs  Lab 01/07/24 0833 01/08/24 0615  WBC 8.8 7.1  HGB 13.2 12.7  HCT 40.5 38.5  MCV 91.2 91.4  PLT 223 220    Basic Metabolic Panel:  Lab Results  Component Value Date    NA 139 01/07/2024   K 4.1 01/07/2024   CO2 22 01/07/2024   GLUCOSE 77 01/07/2024   BUN 13 01/07/2024   CREATININE 1.18 (H) 01/07/2024   CALCIUM 8.2 (L) 01/07/2024   GFRNONAA 54 (L) 01/07/2024   GFRAA 46 (L) 06/13/2020   Lipid Panel:  Lab Results  Component Value Date   LDLCALC 68 12/22/2023   HgbA1c:  Lab Results  Component Value Date   HGBA1C 4.8 12/22/2023   Urine Drug Screen:     Component Value Date/Time   LABOPIA NONE DETECTED 12/13/2023 1701   COCAINSCRNUR NONE DETECTED 12/13/2023 1701   LABBENZ POSITIVE (A) 12/13/2023 1701   AMPHETMU NONE DETECTED 12/13/2023 1701   THCU NONE DETECTED 12/13/2023 1701   LABBARB NONE DETECTED 12/13/2023 1701    Alcohol Level     Component Value Date/Time   ETH <10 01/06/2024 1818   INR  Lab Results  Component Value Date   INR 1.4 (H) 01/08/2024   APTT  Lab Results  Component Value Date   APTT 26 03/05/2022   AED levels: No results found for: "PHENYTOIN", "ZONISAMIDE", "LAMOTRIGINE", "LEVETIRACETA"  CT Head without contrast(Personally reviewed): No acute process     Assessment   [name] is a 58 y.o. female with a history of lupus and rheumatoid arthritis on chronic immunosuppression who presents with a 1 month history of worsening cognition, and unsteadiness. She was found to have a low cortisol with her last hospitalization and adrenal insufficiency could conceivably present with dizziness (she was hypotensive) as well as cognitive dysfunction. With her immune suppression, would also have to consider indolent infections and therefore lumbar puncture needs to continue to be considered.  INR 1.4 today   Recommendations  -LP under Fluoro. Spoke with radiology and will get done this afternoon. - CSF for protein, glucose, cells, IgG index and oligoclonal bands  - Hold heparin gtt for LP. And restart this evening  - neurology will continue to follow   ____________________________________________________________________  Gevena Mart DNP, ACNPC-AG  [facility]  I have seen the patient reviewed the above note.  We will plan on LP today, appreciate IR assistance.  Ritta Slot, MD [facility]   If 7pm- 7am, please page neurology on call as listed in AMION.

## 2024-01-08 NOTE — TOC Progression Note (Addendum)
 Transition of Care [facility]) - Progression Note    Patient Details  Name: Yuka Lallier MRN: [id number] Date of Birth: 1965-11-17  [facility] CM/SW Contact  Dellie Burns [place], [place] Phone Number: 01/08/2024, 1:44 PM  Clinical Narrative: Provided current SNF offers to pt and pt's dtr [name] who will review options and update SW on choice. Will follow.   UPDATE 1415: Pt's dtr has accepted [facility]. Confirmed bed with [facility] in [facility] admissions. Per MD, not medically ready for dc until potentially next week. Will start auth closer to dc.   Dellie Burns, MSW, LCSW [phone] (coverage)        Expected Discharge Plan: Skilled Nursing Facility Barriers to Discharge: Continued Medical Work up, SNF Pending bed offer, Insurance Authorization  Expected Discharge Plan and Services     Post Acute Care Choice: Skilled Nursing Facility Living arrangements for the past 2 months: Single Family Home                                       Social Determinants of Health (SDOH) Interventions SDOH Screenings   Food Insecurity: Food Insecurity Present (01/06/2024)  Housing: Low Risk  (01/06/2024)  Transportation Needs: No Transportation Needs (01/06/2024)  Utilities: At Risk (01/06/2024)  Social Connections: Socially Isolated (01/06/2024)  Tobacco Use: Low Risk  (01/06/2024)    Readmission Risk Interventions     No data to display

## 2024-01-09 DIAGNOSIS — R42 Dizziness and giddiness: Secondary | ICD-10-CM | POA: Diagnosis not present

## 2024-01-09 DIAGNOSIS — E2740 Unspecified adrenocortical insufficiency: Secondary | ICD-10-CM

## 2024-01-09 DIAGNOSIS — Z7960 Long term (current) use of unspecified immunomodulators and immunosuppressants: Secondary | ICD-10-CM | POA: Diagnosis not present

## 2024-01-09 LAB — PROTIME-INR
INR: 1.3 — ABNORMAL HIGH (ref 0.8–1.2)
Prothrombin Time: 16.5 s — ABNORMAL HIGH (ref 11.4–15.2)

## 2024-01-09 LAB — CBC
HCT: 37.6 % (ref 36.0–46.0)
Hemoglobin: 12.5 g/dL (ref 12.0–15.0)
MCH: 29.8 pg (ref 26.0–34.0)
MCHC: 33.2 g/dL (ref 30.0–36.0)
MCV: 89.7 fL (ref 80.0–100.0)
Platelets: 213 10*3/uL (ref 150–400)
RBC: 4.19 MIL/uL (ref 3.87–5.11)
RDW: 13.2 % (ref 11.5–15.5)
WBC: 7.4 10*3/uL (ref 4.0–10.5)
nRBC: 0 % (ref 0.0–0.2)

## 2024-01-09 LAB — BASIC METABOLIC PANEL
Anion gap: 7 (ref 5–15)
BUN: 12 mg/dL (ref 6–20)
CO2: 25 mmol/L (ref 22–32)
Calcium: 8.3 mg/dL — ABNORMAL LOW (ref 8.9–10.3)
Chloride: 107 mmol/L (ref 98–111)
Creatinine, Ser: 1.14 mg/dL — ABNORMAL HIGH (ref 0.44–1.00)
GFR, Estimated: 56 mL/min — ABNORMAL LOW (ref >60)
Glucose, Bld: 90 mg/dL (ref 70–99)
Potassium: 3.3 mmol/L — ABNORMAL LOW (ref 3.5–5.1)
Sodium: 139 mmol/L (ref 135–145)

## 2024-01-09 LAB — HEPARIN LEVEL (UNFRACTIONATED)
Heparin Unfractionated: 0.16 [IU]/mL — ABNORMAL LOW (ref 0.30–0.70)
Heparin Unfractionated: 0.39 [IU]/mL (ref 0.30–0.70)
Heparin Unfractionated: 0.3 [IU]/mL (ref 0.30–0.70)

## 2024-01-09 MED ORDER — POTASSIUM CHLORIDE CRYS ER 20 MEQ PO TBCR
40.0000 meq | EXTENDED_RELEASE_TABLET | Freq: Once | ORAL | Status: AC
  Administered 2024-01-09: 40 meq via ORAL
  Filled 2024-01-09: qty 2

## 2024-01-09 MED ORDER — HYDROCORTISONE SOD SUC (PF) 100 MG IJ SOLR: 100.0000 mg | Freq: Two times a day (BID) | INTRAMUSCULAR | Status: DC

## 2024-01-09 MED ORDER — WARFARIN SODIUM 6 MG PO TABS
6.0000 mg | ORAL_TABLET | Freq: Once | ORAL | Status: AC
  Administered 2024-01-09: 6 mg via ORAL
  Filled 2024-01-09: qty 1

## 2024-01-09 MED ORDER — PREDNISONE 50 MG PO TABS
50.0000 mg | ORAL_TABLET | Freq: Every day | ORAL | Status: DC
  Administered 2024-01-09 – 2024-01-11 (×3): 50 mg via ORAL
  Filled 2024-01-09 (×3): qty 1

## 2024-01-09 NOTE — Progress Notes (Signed)
 PHARMACY - ANTICOAGULATION CONSULT NOTE  Pharmacy Consult for Heparin and Warfarin  Indication: hx pulmonary embolus  Allergies  Allergen Reactions   Acetaminophen Nausea And Vomiting   Belsomra [Suvorexant] Shortness Of Breath and Rash   Codeine Shortness Of Breath   Iodinated Contrast Media Shortness Of Breath    Patient Measurements: Height: 5\' 8"  (172.7 cm) Weight: 77 kg (169 lb 12.1 oz) IBW/kg (Calculated) : 63.9 Heparin Dosing Weight: TBW  Vital Signs: Temp: 97.7 F (36.5 C) (03/08 0935) Temp Source: Oral (03/08 0935) BP: 108/79 (03/08 0935) Pulse Rate: 76 (03/08 0935)  Labs: Recent Labs    01/07/24 1610 01/07/24 1828 01/08/24 0615 01/09/24 0107 01/09/24 0341 01/09/24 1048  HGB 13.2  --  12.7  --  12.5  --   HCT 40.5  --  38.5  --  37.6  --   PLT 223  --  220  --  213  --   LABPROT 18.7*  --  17.8*  --   --  16.5*  INR 1.5*  --  1.4*  --   --  1.3*  HEPARINUNFRC 0.92*   < > 0.84* 0.16*  --  0.39  CREATININE 1.18*  --   --   --  1.14*  --    < > = values in this interval not displayed.    Estimated Creatinine Clearance: 60.1 mL/min (A) (by C-G formula based on SCr of 1.14 mg/dL (H)).   Medical History: Past Medical History:  Diagnosis Date   Anemia    Anxiety    Arthritis    CKD (chronic kidney disease) stage 3, GFR 30-59 ml/min (HCC)    Depression    History of blood transfusion    in [place]   Hyperlipidemia    Hypertension    Lupus    PE (pulmonary embolism)    Pneumonia    x 3 - last time 2017   PTSD (post-traumatic stress disorder)    Renal disorder    Rheumatoid arthritis (HCC)    Seizures (HCC)    Stroke (HCC)    many years ago per patient, no deficits   SVD (spontaneous vaginal delivery)    x 3   TIA (transient ischemic attack)    many years ago per patient, no deficits    Medications:  Medications Prior to Admission  Medication Sig Dispense Refill Last Dose/Taking   ALPRAZolam (XANAX) 1 MG tablet Take 1 tablet (1 mg total) by  mouth 2 (two) times daily. 90 tablet 3 01/04/2024   fludrocortisone (FLORINEF) 0.1 MG tablet Take 0.5 tablets (0.05 mg total) by mouth daily. 30 tablet 1 01/05/2024 Morning   hydroxychloroquine (PLAQUENIL) 200 MG tablet Take 200 mg by mouth daily.   01/05/2024 Morning   meclizine (ANTIVERT) 25 MG tablet Take 1 tablet (25 mg total) by mouth 3 (three) times daily as needed for dizziness. 30 tablet 0 01/05/2024 Evening   metoprolol succinate (TOPROL-XL) 25 MG 24 hr tablet Take 25 mg by mouth every morning.   01/05/2024   predniSONE (DELTASONE) 5 MG tablet Take 1 tablet (5 mg total) by mouth daily with breakfast. 30 tablet 3 01/05/2024 Morning   rosuvastatin (CRESTOR) 10 MG tablet Take 10 mg by mouth in the morning.   01/05/2024 Morning   warfarin (COUMADIN) 4 MG tablet Take 4 mg by mouth daily.   01/05/2024 at  7:00 PM   zolpidem (AMBIEN) 10 MG tablet Take 1 tablet (10 mg total) by mouth at bedtime as needed for  sleep. 30 tablet 3 Past Week   alendronate (FOSAMAX) 70 MG tablet Take 70 mg by mouth once a week. (Patient not taking: Reported on 12/31/2023)   Not Taking   docusate sodium (COLACE) 100 MG capsule Take 1 capsule (100 mg total) by mouth 2 (two) times daily. (Patient not taking: Reported on 12/22/2023) 10 capsule 0     Assessment: 15 yof who is admitted s/p falls at home with LOC; diplopia also noted. PMH includes SLE, RA, HTN, HLD, CVA / TIA, hx PE on warfarin PTA, anxiety / depression, CKD3a, PTSD, migraines, and vision impairment. Pharmacy consulted to begin IV heparin while warfarin is on hold for possible procedure.  CT head neg for bleed.  Heparin level this morning is 0.84.  Per overnight RN report, patient had disconnected heparin IV tubing from the alaris pump and the line was clamped when she entered the room early this AM.  No overt bleeding or complications noted. Around noon, Pharmacy was asked by neurology PA to hold heparin in anticipation of LP this afternoon.  Will plan to resume IV heparin at 7  pm after procedure.  Check heparin level 8 hrs after gtt resumes.   01/09/24 update:  Warfarin restarted on 3/7.   Continues on IV heparin bridge for h/o PE, on Warfarin prior to admission.  HL therapeutic on 1000 units/hr.   INR 1.3 , subtherapeutic CBC wnl . No bleeding noted.  SCr 1.14 down from 1.18   Goal of Therapy:  INR 2-3 Heparin level 0.3-0.7 units/ml Monitor platelets by anticoagulation protocol: Yes   Plan:  Continue  heparin 1000 units/hr  Recheck HL in 6 hours to confirm remains therapeutic on current rate Give Warfarin 6 mg today x1 Daily HL, INR , CBC  Thank you for allowing pharmacy to be part of this patients care team.  Noah Delaine, RPh Clinical Pharmacist Please check AMION for all [facility] Pharmacy phone numbers After 10:00 PM, call Main Pharmacy [phone]

## 2024-01-09 NOTE — Plan of Care (Signed)
   Problem: Health Behavior/Discharge Planning: Goal: Ability to manage health-related needs will improve Outcome: Progressing

## 2024-01-09 NOTE — Progress Notes (Signed)
 PHARMACY - ANTICOAGULATION CONSULT NOTE  Pharmacy Consult for Heparin Indication: hx pulmonary embolus  Allergies  Allergen Reactions   Acetaminophen Nausea And Vomiting   Belsomra [Suvorexant] Shortness Of Breath and Rash   Codeine Shortness Of Breath   Iodinated Contrast Media Shortness Of Breath    Patient Measurements: Height: 5\' 8"  (172.7 cm) Weight: 77 kg (169 lb 12.1 oz) IBW/kg (Calculated) : 63.9 Heparin Dosing Weight: TBW  Vital Signs: Temp: 98.3 F (36.8 C) (03/07 2025) BP: 97/52 (03/07 2025) Pulse Rate: 73 (03/07 2025)  Labs: Recent Labs    01/06/24 1604 01/07/24 0833 01/07/24 0833 01/07/24 1828 01/08/24 0615 01/09/24 0107  HGB  --  13.2  --   --  12.7  --   HCT  --  40.5  --   --  38.5  --   PLT  --  223  --   --  220  --   LABPROT 19.2* 18.7*  --   --  17.8*  --   INR 1.6* 1.5*  --   --  1.4*  --   HEPARINUNFRC  --  0.92*   < > 0.66 0.84* 0.16*  CREATININE  --  1.18*  --   --   --   --    < > = values in this interval not displayed.    Estimated Creatinine Clearance: 58.1 mL/min (A) (by C-G formula based on SCr of 1.18 mg/dL (H)).   Medical History: Past Medical History:  Diagnosis Date   Anemia    Anxiety    Arthritis    CKD (chronic kidney disease) stage 3, GFR 30-59 ml/min (HCC)    Depression    History of blood transfusion    in [place]   Hyperlipidemia    Hypertension    Lupus    PE (pulmonary embolism)    Pneumonia    x 3 - last time 2017   PTSD (post-traumatic stress disorder)    Renal disorder    Rheumatoid arthritis (HCC)    Seizures (HCC)    Stroke (HCC)    many years ago per patient, no deficits   SVD (spontaneous vaginal delivery)    x 3   TIA (transient ischemic attack)    many years ago per patient, no deficits    Medications:  Medications Prior to Admission  Medication Sig Dispense Refill Last Dose/Taking   ALPRAZolam (XANAX) 1 MG tablet Take 1 tablet (1 mg total) by mouth 2 (two) times daily. 90 tablet 3 01/04/2024    fludrocortisone (FLORINEF) 0.1 MG tablet Take 0.5 tablets (0.05 mg total) by mouth daily. 30 tablet 1 01/05/2024 Morning   hydroxychloroquine (PLAQUENIL) 200 MG tablet Take 200 mg by mouth daily.   01/05/2024 Morning   meclizine (ANTIVERT) 25 MG tablet Take 1 tablet (25 mg total) by mouth 3 (three) times daily as needed for dizziness. 30 tablet 0 01/05/2024 Evening   metoprolol succinate (TOPROL-XL) 25 MG 24 hr tablet Take 25 mg by mouth every morning.   01/05/2024   predniSONE (DELTASONE) 5 MG tablet Take 1 tablet (5 mg total) by mouth daily with breakfast. 30 tablet 3 01/05/2024 Morning   rosuvastatin (CRESTOR) 10 MG tablet Take 10 mg by mouth in the morning.   01/05/2024 Morning   warfarin (COUMADIN) 4 MG tablet Take 4 mg by mouth daily.   01/05/2024 at  7:00 PM   zolpidem (AMBIEN) 10 MG tablet Take 1 tablet (10 mg total) by mouth at bedtime as needed for  sleep. 30 tablet 3 Past Week   alendronate (FOSAMAX) 70 MG tablet Take 70 mg by mouth once a week. (Patient not taking: Reported on 12/31/2023)   Not Taking   docusate sodium (COLACE) 100 MG capsule Take 1 capsule (100 mg total) by mouth 2 (two) times daily. (Patient not taking: Reported on 12/22/2023) 10 capsule 0     Assessment: 70 yof who is admitted s/p falls at home with LOC; diplopia also noted. PMH includes SLE, RA, HTN, HLD, CVA / TIA, hx PE on warfarin PTA, anxiety / depression, CKD3a, PTSD, migraines, and vision impairment. Pharmacy consulted to begin IV heparin while warfarin is on hold for possible procedure.  CT head neg for bleed.  Heparin level this morning is 0.84.  Per overnight RN report, patient had disconnected heparin IV tubing from the alaris pump and the line was clamped when she entered the room early this AM.  No overt bleeding or complications noted. Around noon, Pharmacy was asked by neurology PA to hold heparin in anticipation of LP this afternoon.  Will plan to resume IV heparin at 7 pm after procedure.  Check heparin level 8 hrs  after gtt resumes.  Warfarin PTA: 4 mg daily, last dose 3/4  INR 1.4, ok to restart warfarin per team.   3/8 AM update:  Heparin level sub-therapeutic   Goal of Therapy:  Heparin level 0.3-0.7 units/ml Monitor platelets by anticoagulation protocol: Yes   Plan:  Inc heparin to 1000 units/hr Heparin level in 6-8 hours  Abran Duke, PharmD, BCPS Clinical Pharmacist Phone: [phone]

## 2024-01-09 NOTE — Plan of Care (Signed)
  Problem: Clinical Measurements: Goal: Will remain free from infection Outcome: Progressing   Problem: Clinical Measurements: Goal: Diagnostic test results will improve Outcome: Progressing   Problem: Activity: Goal: Risk for activity intolerance will decrease Outcome: Progressing   Problem: Nutrition: Goal: Adequate nutrition will be maintained Outcome: Progressing   Problem: Safety: Goal: Ability to remain free from injury will improve Outcome: Progressing   Problem: Skin Integrity: Goal: Risk for impaired skin integrity will decrease Outcome: Progressing

## 2024-01-09 NOTE — Progress Notes (Signed)
 PHARMACY - ANTICOAGULATION CONSULT NOTE  Pharmacy Consult for Heparin  Indication: hx pulmonary embolus (on warfarin PTA)  Allergies  Allergen Reactions   Acetaminophen Nausea And Vomiting   Belsomra [Suvorexant] Shortness Of Breath and Rash   Codeine Shortness Of Breath   Iodinated Contrast Media Shortness Of Breath    Patient Measurements: Height: 5\' 8"  (172.7 cm) Weight: 77 kg (169 lb 12.1 oz) IBW/kg (Calculated) : 63.9 Heparin Dosing Weight: TBW  Vital Signs: Temp: 98.2 F (36.8 C) (03/08 1627) Temp Source: Oral (03/08 1627) BP: 119/70 (03/08 1627) Pulse Rate: 73 (03/08 1627)  Labs: Recent Labs    01/07/24 0833 01/07/24 1828 01/08/24 0615 01/09/24 0107 01/09/24 0341 01/09/24 1048 01/09/24 1658  HGB 13.2  --  12.7  --  12.5  --   --   HCT 40.5  --  38.5  --  37.6  --   --   PLT 223  --  220  --  213  --   --   LABPROT 18.7*  --  17.8*  --   --  16.5*  --   INR 1.5*  --  1.4*  --   --  1.3*  --   HEPARINUNFRC 0.92*   < > 0.84* 0.16*  --  0.39 0.30  CREATININE 1.18*  --   --   --  1.14*  --   --    < > = values in this interval not displayed.    Estimated Creatinine Clearance: 60.1 mL/min (A) (by C-G formula based on SCr of 1.14 mg/dL (H)).   Medical History: Past Medical History:  Diagnosis Date   Anemia    Anxiety    Arthritis    CKD (chronic kidney disease) stage 3, GFR 30-59 ml/min (HCC)    Depression    History of blood transfusion    in [place]   Hyperlipidemia    Hypertension    Lupus    PE (pulmonary embolism)    Pneumonia    x 3 - last time 2017   PTSD (post-traumatic stress disorder)    Renal disorder    Rheumatoid arthritis (HCC)    Seizures (HCC)    Stroke (HCC)    many years ago per patient, no deficits   SVD (spontaneous vaginal delivery)    x 3   TIA (transient ischemic attack)    many years ago per patient, no deficits    Medications:  Medications Prior to Admission  Medication Sig Dispense Refill Last Dose/Taking    ALPRAZolam (XANAX) 1 MG tablet Take 1 tablet (1 mg total) by mouth 2 (two) times daily. 90 tablet 3 01/04/2024   fludrocortisone (FLORINEF) 0.1 MG tablet Take 0.5 tablets (0.05 mg total) by mouth daily. 30 tablet 1 01/05/2024 Morning   hydroxychloroquine (PLAQUENIL) 200 MG tablet Take 200 mg by mouth daily.   01/05/2024 Morning   meclizine (ANTIVERT) 25 MG tablet Take 1 tablet (25 mg total) by mouth 3 (three) times daily as needed for dizziness. 30 tablet 0 01/05/2024 Evening   metoprolol succinate (TOPROL-XL) 25 MG 24 hr tablet Take 25 mg by mouth every morning.   01/05/2024   predniSONE (DELTASONE) 5 MG tablet Take 1 tablet (5 mg total) by mouth daily with breakfast. 30 tablet 3 01/05/2024 Morning   rosuvastatin (CRESTOR) 10 MG tablet Take 10 mg by mouth in the morning.   01/05/2024 Morning   warfarin (COUMADIN) 4 MG tablet Take 4 mg by mouth daily.   01/05/2024 at  7:00 PM   zolpidem (AMBIEN) 10 MG tablet Take 1 tablet (10 mg total) by mouth at bedtime as needed for sleep. 30 tablet 3 Past Week   alendronate (FOSAMAX) 70 MG tablet Take 70 mg by mouth once a week. (Patient not taking: Reported on 12/31/2023)   Not Taking   docusate sodium (COLACE) 100 MG capsule Take 1 capsule (100 mg total) by mouth 2 (two) times daily. (Patient not taking: Reported on 12/22/2023) 10 capsule 0     Assessment: 58 yo F who is admitted s/p falls at home with LOC; diplopia also noted. PMH includes SLE, RA, HTN, HLD, CVA / TIA, hx PE on warfarin PTA, anxiety / depression, CKD3a, PTSD, migraines, and vision impairment. Pharmacy consulted to begin IV heparin while warfarin is on hold for possible procedure.   CT head neg for bleed.  Heparin level remains therapeutic on 1000 units/hr, however trending down.  Will increase rate to maintain goal level.   Warfarin restarted this PM as well.  Goal of Therapy:  INR 2-3 Heparin level 0.3-0.7 units/ml Monitor platelets by anticoagulation protocol: Yes   Plan:  Increase heparin 1100  units/hr  Daily heparin level, INR, and CBC  Thank you for allowing pharmacy to be part of this patients care team.   Dixie Dials, Pharm.D., BCPS Clinical Pharmacist  **Pharmacist phone directory can be found on [email or web address] listed under [facility].  01/09/2024 8:03 PM

## 2024-01-09 NOTE — Progress Notes (Signed)
 PROGRESS NOTE    Lauren Chung  ZOX:[id number] DOB: [birth year] DOA: 01/05/2024 PCP: Tally Joe, MD    Brief Narrative:  Lauren Chung is a 58 y.o. 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  [name] while trying to get from bed to bathroom.  Hospitalist has been consulted for further management of syncopal episode in the context of hypotension, recurrent fall and AKI.  Of note she was prescribed florinef on 2/27 by neurologist but she never picked up       Assessment and Plan:  Recurrent falls/Syncope Hypotension/Vertigo due to adrenal insuffiencey  -Hydrocortisone 100 mg IV in ER-- unable to get stim test done in the hospital but 2 different cortisol tests were low in the AM -check orthos daily -start stress dose steroids -will need outpatient endocrine follow up -Continue meclizine as needed -Physical therapy to evaluate -Neurology consulted -LP done and unremarkable   History of prior CVA with residual deficit Patient with prior history of CVA with mild left leg weakness.   History of pulmonary embolism on chronic anticoagulation -change back to coumadin    Rheumatoid arthritis SLE Patient on rituximab in outpatient setting -Continue Plaquenil and prednisone (currently on stress dose   Anxiety -Decrease Xanax to 0.5 mg twice daily as needed   Chronic kidney disease stage IIIa -stable   hypokalemia -replete   DVT prophylaxis:     Code Status: Full Code   Disposition Plan:  Level of care: Telemetry Medical Status is: Inpatient Remains inpatient appropriate    Consultants:  neurology   Subjective: Much improved  3/8  Objective: Vitals:   01/08/24 1700 01/08/24 2025 01/09/24 0605 01/09/24 0935  BP: 101/69 (!) 97/52 109/70 108/79  Pulse: 73 73 60 76  Resp: 18 18 18 16   Temp:  98.3 F (36.8 C) 98.4 F (36.9 C) 97.7 F (36.5 C)  TempSrc:    Oral  SpO2: 100% 97% 100% 100%  Weight:      Height:        Intake/Output Summary (Last 24 hours) at 01/09/2024 1154 Last data filed at 01/09/2024 0654 Gross per 24 hour  Intake 480 ml  Output 1050 ml  Net -570 ml   Filed Weights   01/05/24 2313  Weight: 77 kg    Examination:    General: Appearance:     Overweight female in no acute distress     Lungs:      respirations unlabored  Heart:    Normal heart rate. .   MS:   All extremities are intact.   Neurologic:  A+Ox3      Data Reviewed: I have personally reviewed following labs and imaging studies  CBC: Recent Labs  Lab 01/05/24 2313 01/07/24 0833 01/08/24 0615 01/09/24 0341  WBC 6.5 8.8 7.1 7.4  HGB 13.1 13.2 12.7 12.5  HCT 40.8 40.5 38.5 37.6  MCV 93.8 91.2 91.4 89.7  PLT 216 223 220 213   Basic Metabolic Panel: Recent Labs  Lab 01/05/24 2313 01/07/24 0833 01/09/24 0341  NA 136 139 139  K 4.2 4.1 3.3*  CL 105 109 107  CO2 26 22 25   GLUCOSE 84 77 90  BUN 12 13 12   CREATININE 1.33* 1.18* 1.14*  CALCIUM 8.2* 8.2* 8.3*  GFR: Estimated Creatinine Clearance: 60.1 mL/min (A) (by C-G formula based on SCr of 1.14 mg/dL (H)). Liver Function Tests: Recent Labs  Lab 01/05/24 2313  AST 28  ALT 31  ALKPHOS 70  BILITOT 0.5  PROT 6.0*  ALBUMIN 3.2*   No results for input(s): "LIPASE", "AMYLASE" in the last 168 hours. No results for input(s): "AMMONIA" in the last 168 hours. Coagulation Profile: Recent Labs  Lab 01/06/24 1604 01/07/24 0833 01/08/24 0615  INR 1.6* 1.5* 1.4*   Cardiac Enzymes: No results for input(s): "CKTOTAL", "CKMB", "CKMBINDEX", "TROPONINI" in the last 168 hours. BNP (last 3 results) No results for input(s): "PROBNP" in the last 8760  hours. HbA1C: No results for input(s): "HGBA1C" in the last 72 hours. CBG: No results for input(s): "GLUCAP" in the last 168 hours. Lipid Profile: No results for input(s): "CHOL", "HDL", "LDLCALC", "TRIG", "CHOLHDL", "LDLDIRECT" in the last 72 hours. Thyroid Function Tests: No results for input(s): "TSH", "T4TOTAL", "FREET4", "T3FREE", "THYROIDAB" in the last 72 hours. Anemia Panel: Recent Labs    01/07/24 1828  VITAMINB12 882   Sepsis Labs: Recent Labs  Lab 01/05/24 2346  LATICACIDVEN 1.0    Recent Results (from the past 240 hours)  Respiratory (~20 pathogens) panel by PCR     Status: None   Collection Time: 01/06/24  3:24 PM   Specimen: Nasopharyngeal Swab; Respiratory  Result Value Ref Range Status   Adenovirus NOT DETECTED NOT DETECTED Final   Coronavirus 229E NOT DETECTED NOT DETECTED Final    Comment: (NOTE) The Coronavirus on the Respiratory Panel, DOES NOT test for the novel  Coronavirus (2019 nCoV)    Coronavirus HKU1 NOT DETECTED NOT DETECTED Final   Coronavirus NL63 NOT DETECTED NOT DETECTED Final   Coronavirus OC43 NOT DETECTED NOT DETECTED Final   Metapneumovirus NOT DETECTED NOT DETECTED Final   Rhinovirus / Enterovirus NOT DETECTED NOT DETECTED Final   Influenza A NOT DETECTED NOT DETECTED Final   Influenza B NOT DETECTED NOT DETECTED Final   Parainfluenza Virus 1 NOT DETECTED NOT DETECTED Final   Parainfluenza Virus 2 NOT DETECTED NOT DETECTED Final   Parainfluenza Virus 3 NOT DETECTED NOT DETECTED Final   Parainfluenza Virus 4 NOT DETECTED NOT DETECTED Final   Respiratory Syncytial Virus NOT DETECTED NOT DETECTED Final   Bordetella pertussis NOT DETECTED NOT DETECTED Final   Bordetella Parapertussis NOT DETECTED NOT DETECTED Final   Chlamydophila pneumoniae NOT DETECTED NOT DETECTED Final   Mycoplasma pneumoniae NOT DETECTED NOT DETECTED Final    Comment: Performed at [facility], [address] [place], [place] [address]  SARS Coronavirus 2  by RT PCR (hospital order, performed in [facility] hospital lab) *cepheid single result test* Nasopharyngeal Swab     Status: None   Collection Time: 01/06/24  3:48 PM   Specimen: Nasopharyngeal Swab; Nasal Swab  Result Value Ref Range Status   SARS Coronavirus 2 by RT PCR NEGATIVE NEGATIVE Final    Comment: Performed at [facility], [address], [place], [place] [address]  CSF culture w Gram Stain     Status: None (Preliminary result)   Collection Time: 01/08/24  2:10 PM   Specimen: PATH Cytology CSF; Cerebrospinal Fluid  Result Value Ref Range Status   Specimen Description CSF  Final   Special Requests NONE  Final   Gram Stain NO WBC SEEN NO ORGANISMS SEEN CYTOSPIN SMEAR   Final   Culture   Final    NO GROWTH < 24  HOURS Performed at [facility], [address] [place], [place] [address]    Report Status PENDING  Incomplete  Culture, Fungus without Smear     Status: None (Preliminary result)   Collection Time: 01/08/24  2:10 PM   Specimen: PATH Cytology CSF; Cerebrospinal Fluid  Result Value Ref Range Status   Specimen Description CSF  Final   Special Requests NONE  Final   Culture   Final    NO GROWTH < 24 HOURS Performed at [facility], [address] [place], [place] [address]    Report Status PENDING  Incomplete         Radiology Studies: DG FL GUIDED LUMBAR PUNCTURE Result Date: 01/08/2024 CLINICAL DATA:  59 year old female with history of lupus currently on immunosuppressant presents with cognitive dysfunction. Request for fluoro guided lumbar puncture. EXAM: DIAGNOSTIC LUMBAR PUNCTURE UNDER FLUOROSCOPIC GUIDANCE COMPARISON:  CT head and C-spine, 01/05/2024. FLUOROSCOPY: Radiation Exposure Index (as provided by the fluoroscopic device): 3.3 mGy Kerma PROCEDURE: Informed consent was obtained from the patient's daughter prior to the procedure, including potential complications and risks of headache, allergy, bleeding, infection, damage to  adjacent structures, internal bleeding into spinal canal, CSF leak which may need additional procedure, and low yield. Time-out was performed to verify correct patient and correct procedure. Allergies were reviewed. With the patient prone, the lower back was prepped with Betadine. 1% Lidocaine was used for local anesthesia. Lumbar puncture was performed at the L2-L3 level using a 3.5 inch 20 gauge needle with return of clear, colorless CSF. Opening pressure was too low to be measured. 10.5 ml of CSF were obtained for laboratory studies. The spinal needle was removed, sterile dressing was placed. The patient tolerated the procedure well and there were no apparent complications. IMPRESSION: Successful fluoroscopic-guided lumbar puncture. This procedure was performed by Lawernce Ion, PA-C under supervision of Roanna Banning, MD. Electronically Signed   By: Roanna Banning M.D.   On: 01/08/2024 14:37         Scheduled Meds:  feeding supplement  237 mL Oral BID BM   hydroxychloroquine  200 mg Oral Daily   predniSONE  50 mg Oral Q breakfast   rosuvastatin  10 mg Oral q AM   sodium chloride flush  3 mL Intravenous Q12H   Warfarin - Pharmacist Dosing Inpatient   Does not apply q1600   Continuous Infusions:  heparin 1,000 Units/hr (01/09/24 0239)     LOS: 2 days    Time spent: 45 minutes spent on chart review, discussion with nursing staff, consultants, updating family and interview/physical exam; more than 50% of that time was spent in counseling and/or coordination of care.    Joseph Art, DO [facility] Available via Epic secure chat 7am-7pm After these hours, please refer to coverage provider listed on [email or web address] 01/09/2024, 11:54 AM

## 2024-01-09 NOTE — Progress Notes (Signed)
 NEUROLOGY CONSULT FOLLOW UP NOTE   Date of service: January 09, 2024 Patient Name: Lauren Chung MRN:  [id number] DOB:  01-Feb-1966  Interval Hx/subjective  She feels much better today Vitals   Vitals:   01/08/24 2025 01/09/24 0605 01/09/24 0935 01/09/24 1627  BP: (!) 97/52 109/70 108/79 119/70  Pulse: 73 60 76 73  Resp: 18 18 16 18   Temp: 98.3 F (36.8 C) 98.4 F (36.9 C) 97.7 F (36.5 C) 98.2 F (36.8 C)  TempSrc:   Oral Oral  SpO2: 97% 100% 100% 94%  Weight:      Height:         Body mass index is 25.81 kg/m.  Physical Exam   Constitutional: Appears well-developed and well-nourished.  Psych: Affect appropriate to situation.  Eyes: No scleral injection.  HENT: No OP obstrucion.  Head: Normocephalic.  Cardiovascular: Normal rate and regular rhythm.  Respiratory: Effort normal, non-labored breathing.  GI: Soft.  No distension. There is no tenderness.  Skin: WDI.   Neurologic Examination  Mental Status -  Level of arousal and orientation to time, place, and person were intact. Language including expression, naming, repetition, comprehension was assessed and found intact.  Cranial Nerves II - XII - II - Visual field intact OU.  III, IV, VI - Extraocular movements intact. V - Facial sensation intact bilaterally. VII - Facial movement intact bilaterally. VIII - Hearing & vestibular intact bilaterally. X - Palate elevates symmetrically. XI - Chin turning & shoulder shrug intact bilaterally. XII - Tongue protrusion intact.  Motor Strength - The patient's strength was normal in all extremities and pronator drift was absent.  Bulk was normal and fasciculations were absent.   Motor Tone - Muscle tone was assessed at the neck and appendages and was normal. Sensory - Light touch, temperature/pinprick were assessed and were symmetrical.   Coordination - The patient had normal movements in the hands and feet with no ataxia or dysmetria.  Tremor was absent. Gait and Station  - deferred.  Medications  Current Facility-Administered Medications:    albuterol (PROVENTIL) (2.5 MG/3ML) 0.083% nebulizer solution 2.5 mg, 2.5 mg, Nebulization, Q6H PRN, Katrinka Blazing, Rondell A, MD   ALPRAZolam Prudy Feeler) tablet 0.5 mg, 0.5 mg, Oral, BID PRN, Katrinka Blazing, Rondell A, MD   feeding supplement (ENSURE ENLIVE / ENSURE PLUS) liquid 237 mL, 237 mL, Oral, BID BM, Smith, Rondell A, MD   heparin ADULT infusion 100 units/mL (25000 units/281mL), 1,000 Units/hr, Intravenous, Continuous, Stevphen Rochester, RPH, Last Rate: 10 mL/hr at 01/09/24 0239, 1,000 Units/hr at 01/09/24 1914   hydroxychloroquine (PLAQUENIL) tablet 200 mg, 200 mg, Oral, Daily, Katrinka Blazing, Rondell A, MD, 200 mg at 01/09/24 7829   meclizine (ANTIVERT) tablet 25 mg, 25 mg, Oral, TID PRN, Katrinka Blazing, Rondell A, MD   ondansetron (ZOFRAN) tablet 4 mg, 4 mg, Oral, Q6H PRN **OR** ondansetron (ZOFRAN) injection 4 mg, 4 mg, Intravenous, Q6H PRN, Katrinka Blazing, Rondell A, MD   predniSONE (DELTASONE) tablet 50 mg, 50 mg, Oral, Q breakfast, Vann, Jessica U, DO, 50 mg at 01/09/24 1125   rosuvastatin (CRESTOR) tablet 10 mg, 10 mg, Oral, q AM, Smith, Rondell A, MD, 10 mg at 01/09/24 0816   sodium chloride flush (NS) 0.9 % injection 3 mL, 3 mL, Intravenous, Q12H, Smith, Rondell A, MD, 3 mL at 01/09/24 1125   warfarin (COUMADIN) tablet 6 mg, 6 mg, Oral, ONCE-1600, Tamera Reason, RPH   Warfarin - Pharmacist Dosing Inpatient, , Does not apply, q1600, Leander Rams, [facility]  Labs and  Diagnostic Imaging   CBC:  Recent Labs  Lab 01/08/24 0615 01/09/24 0341  WBC 7.1 7.4  HGB 12.7 12.5  HCT 38.5 37.6  MCV 91.4 89.7  PLT 220 213    Basic Metabolic Panel:  Lab Results  Component Value Date   NA 139 01/09/2024   K 3.3 (L) 01/09/2024   CO2 25 01/09/2024   GLUCOSE 90 01/09/2024   BUN 12 01/09/2024   CREATININE 1.14 (H) 01/09/2024   CALCIUM 8.3 (L) 01/09/2024   GFRNONAA 56 (L) 01/09/2024   GFRAA 46 (L) 06/13/2020   Lipid Panel:  Lab Results  Component Value Date    LDLCALC 68 12/22/2023   HgbA1c:  Lab Results  Component Value Date   HGBA1C 4.8 12/22/2023   Urine Drug Screen:     Component Value Date/Time   LABOPIA NONE DETECTED 12/13/2023 1701   COCAINSCRNUR NONE DETECTED 12/13/2023 1701   LABBENZ POSITIVE (A) 12/13/2023 1701   AMPHETMU NONE DETECTED 12/13/2023 1701   THCU NONE DETECTED 12/13/2023 1701   LABBARB NONE DETECTED 12/13/2023 1701    Alcohol Level     Component Value Date/Time   ETH <10 01/06/2024 1818   INR  Lab Results  Component Value Date   INR 1.3 (H) 01/09/2024   APTT  Lab Results  Component Value Date   APTT 26 03/05/2022   AED levels: No results found for: "PHENYTOIN", "ZONISAMIDE", "LAMOTRIGINE", "LEVETIRACETA"  CT Head without contrast(Personally reviewed): No acute process     Assessment   Lauren Chung is a 58 y.o. female with a history of lupus and rheumatoid arthritis on chronic immunosuppression who presents with a 1 month history of worsening cognition, and unsteadiness. She was found to have a low cortisol with her last hospitalization and adrenal insufficiency could conceivably present with dizziness (she was hypotensive) as well as cognitive dysfunction. With her immune suppression as well as history of autoimmune disease, I did feel that an LP to rule out any type of inflammatory or infectious process was prudent and this is negative.  At this point, I think that adrenal insufficiency is most likely.  She seems to be much better, and I doubt that any further neurological testing is likely to be of benefit to her at this time.  If her symptoms continue after treatment, then we can readdress at that time.  Recommendations  -Adrenal insufficiency treatment per internal medicine - neurology will be available on an as-needed basis, please call if further questions or concerns. ____________________________________________________________________

## 2024-01-10 DIAGNOSIS — R42 Dizziness and giddiness: Secondary | ICD-10-CM | POA: Diagnosis not present

## 2024-01-10 LAB — CBC
HCT: 37.2 % (ref 36.0–46.0)
Hemoglobin: 12.5 g/dL (ref 12.0–15.0)
MCH: 30.2 pg (ref 26.0–34.0)
MCHC: 33.6 g/dL (ref 30.0–36.0)
MCV: 89.9 fL (ref 80.0–100.0)
Platelets: 222 10*3/uL (ref 150–400)
RBC: 4.14 MIL/uL (ref 3.87–5.11)
RDW: 13.2 % (ref 11.5–15.5)
WBC: 11.7 10*3/uL — ABNORMAL HIGH (ref 4.0–10.5)
nRBC: 0 % (ref 0.0–0.2)

## 2024-01-10 LAB — BASIC METABOLIC PANEL
Anion gap: 7 (ref 5–15)
BUN: 16 mg/dL (ref 6–20)
CO2: 24 mmol/L (ref 22–32)
Calcium: 8.8 mg/dL — ABNORMAL LOW (ref 8.9–10.3)
Chloride: 107 mmol/L (ref 98–111)
Creatinine, Ser: 1.07 mg/dL — ABNORMAL HIGH (ref 0.44–1.00)
GFR, Estimated: >60 mL/min (ref >60)
Glucose, Bld: 98 mg/dL (ref 70–99)
Potassium: 3.4 mmol/L — ABNORMAL LOW (ref 3.5–5.1)
Sodium: 138 mmol/L (ref 135–145)

## 2024-01-10 LAB — PROTIME-INR
INR: 1.4 — ABNORMAL HIGH (ref 0.8–1.2)
Prothrombin Time: 17.3 s — ABNORMAL HIGH (ref 11.4–15.2)

## 2024-01-10 LAB — HEPARIN LEVEL (UNFRACTIONATED)
Heparin Unfractionated: 0.84 [IU]/mL — ABNORMAL HIGH (ref 0.30–0.70)
Heparin Unfractionated: 0.92 [IU]/mL — ABNORMAL HIGH (ref 0.30–0.70)

## 2024-01-10 MED ORDER — IBUPROFEN 600 MG PO TABS
600.0000 mg | ORAL_TABLET | Freq: Once | ORAL | Status: AC
  Administered 2024-01-10: 600 mg via ORAL
  Filled 2024-01-10: qty 1

## 2024-01-10 MED ORDER — WARFARIN SODIUM 6 MG PO TABS
6.0000 mg | ORAL_TABLET | Freq: Once | ORAL | Status: AC
  Administered 2024-01-10: 6 mg via ORAL
  Filled 2024-01-10: qty 1

## 2024-01-10 MED ORDER — MELATONIN 3 MG PO TABS
3.0000 mg | ORAL_TABLET | Freq: Every day | ORAL | Status: DC
  Administered 2024-01-10 – 2024-01-12 (×2): 3 mg via ORAL
  Filled 2024-01-10 (×2): qty 1

## 2024-01-10 MED ORDER — IBUPROFEN 600 MG PO TABS
600.0000 mg | ORAL_TABLET | Freq: Four times a day (QID) | ORAL | Status: DC | PRN
  Administered 2024-01-10 – 2024-01-13 (×4): 600 mg via ORAL
  Filled 2024-01-10 (×4): qty 1

## 2024-01-10 MED ORDER — ORAL CARE MOUTH RINSE: 15.0000 mL | OROMUCOSAL | Status: DC | PRN

## 2024-01-10 NOTE — Plan of Care (Signed)
  Problem: Health Behavior/Discharge Planning: Goal: Ability to manage health-related needs will improve Outcome: Progressing   Problem: Clinical Measurements: Goal: Will remain free from infection Outcome: Progressing   Problem: Clinical Measurements: Goal: Diagnostic test results will improve Outcome: Progressing   Problem: Nutrition: Goal: Adequate nutrition will be maintained Outcome: Progressing   Problem: Activity: Goal: Risk for activity intolerance will decrease Outcome: Progressing   Problem: Coping: Goal: Level of anxiety will decrease Outcome: Progressing   Problem: Safety: Goal: Ability to remain free from injury will improve Outcome: Progressing

## 2024-01-10 NOTE — Progress Notes (Signed)
 PHARMACY - ANTICOAGULATION CONSULT NOTE  Pharmacy Consult for Heparin  and Warfarin Indication: hx pulmonary embolus (on warfarin PTA)  Allergies  Allergen Reactions   Acetaminophen Nausea And Vomiting   Belsomra [Suvorexant] Shortness Of Breath and Rash   Codeine Shortness Of Breath   Iodinated Contrast Media Shortness Of Breath    Patient Measurements: Height: 5\' 8"  (172.7 cm) Weight: 77 kg (169 lb 12.1 oz) IBW/kg (Calculated) : 63.9 Heparin Dosing Weight: TBW  Vital Signs: Temp: 98.4 F (36.9 C) (03/09 0502) Temp Source: Oral (03/09 0502) BP: 133/81 (03/09 0502) Pulse Rate: 63 (03/09 0502)  Labs: Recent Labs    01/07/24 4098 01/07/24 1828 01/08/24 0615 01/09/24 0107 01/09/24 0341 01/09/24 1048 01/09/24 1658 01/10/24 0546  HGB 13.2  --  12.7  --  12.5  --   --  12.5  HCT 40.5  --  38.5  --  37.6  --   --  37.2  PLT 223  --  220  --  213  --   --  222  LABPROT 18.7*  --  17.8*  --   --  16.5*  --  17.3*  INR 1.5*  --  1.4*  --   --  1.3*  --  1.4*  HEPARINUNFRC 0.92*   < > 0.84*   < >  --  0.39 0.30 0.84*  CREATININE 1.18*  --   --   --  1.14*  --   --  1.07*   < > = values in this interval not displayed.    Estimated Creatinine Clearance: 64 mL/min (A) (by C-G formula based on SCr of 1.07 mg/dL (H)).   Medical History: Past Medical History:  Diagnosis Date   Anemia    Anxiety    Arthritis    CKD (chronic kidney disease) stage 3, GFR 30-59 ml/min (HCC)    Depression    History of blood transfusion    in [place]   Hyperlipidemia    Hypertension    Lupus    PE (pulmonary embolism)    Pneumonia    x 3 - last time 2017   PTSD (post-traumatic stress disorder)    Renal disorder    Rheumatoid arthritis (HCC)    Seizures (HCC)    Stroke (HCC)    many years ago per patient, no deficits   SVD (spontaneous vaginal delivery)    x 3   TIA (transient ischemic attack)    many years ago per patient, no deficits    Medications:  Medications Prior to Admission   Medication Sig Dispense Refill Last Dose/Taking   ALPRAZolam (XANAX) 1 MG tablet Take 1 tablet (1 mg total) by mouth 2 (two) times daily. 90 tablet 3 01/04/2024   fludrocortisone (FLORINEF) 0.1 MG tablet Take 0.5 tablets (0.05 mg total) by mouth daily. 30 tablet 1 01/05/2024 Morning   hydroxychloroquine (PLAQUENIL) 200 MG tablet Take 200 mg by mouth daily.   01/05/2024 Morning   meclizine (ANTIVERT) 25 MG tablet Take 1 tablet (25 mg total) by mouth 3 (three) times daily as needed for dizziness. 30 tablet 0 01/05/2024 Evening   metoprolol succinate (TOPROL-XL) 25 MG 24 hr tablet Take 25 mg by mouth every morning.   01/05/2024   predniSONE (DELTASONE) 5 MG tablet Take 1 tablet (5 mg total) by mouth daily with breakfast. 30 tablet 3 01/05/2024 Morning   rosuvastatin (CRESTOR) 10 MG tablet Take 10 mg by mouth in the morning.   01/05/2024 Morning   warfarin (COUMADIN)  4 MG tablet Take 4 mg by mouth daily.   01/05/2024 at  7:00 PM   zolpidem (AMBIEN) 10 MG tablet Take 1 tablet (10 mg total) by mouth at bedtime as needed for sleep. 30 tablet 3 Past Week   alendronate (FOSAMAX) 70 MG tablet Take 70 mg by mouth once a week. (Patient not taking: Reported on 12/31/2023)   Not Taking   docusate sodium (COLACE) 100 MG capsule Take 1 capsule (100 mg total) by mouth 2 (two) times daily. (Patient not taking: Reported on 12/22/2023) 10 capsule 0     Assessment: 58 yo F who is admitted s/p falls at home with LOC; diplopia also noted. PMH includes SLE, RA, HTN, HLD, CVA / TIA, hx PE on warfarin PTA, anxiety / depression, CKD3a, PTSD, migraines, and vision impairment. Pharmacy consulted to begin IV heparin while warfarin is on hold for possible procedure.   CT head neg for bleed.   HL= 0.84,  supra- therapeutic on 1100 ut/hr.  HL increased significantly from 0.3 to 0.84 after heparin increased appropriately to 1100 units/hr last night.   No report of heparin infusion issues and no active bleeding.  RN confirmed AM HL drawn from  opposite upper extremity of where heparin is infusing, thus  should be accurate level. Will reduce heparin rate.  INR 1.3>1.4, subtherapeutic,  Warfarin resumed 3/7 -Hgb 12s stable, pltc  wnl stable  CT head negative for bleed.  Goal of Therapy:  INR 2-3 Heparin level 0.3-0.7 units/ml Monitor platelets by anticoagulation protocol: Yes   Plan:  Decrease Heparin to 1000 units/hr  Check 6 hr HL Give Warfarin 6 mg today x1 Daily HL, INR , CBC   Thank you for allowing pharmacy to be part of this patients care team.  Noah Delaine, RPh Clinical Pharmacist  **Pharmacist phone directory can be found on [email or web address] listed under [facility].  01/10/2024 9:18 AM

## 2024-01-10 NOTE — Progress Notes (Signed)
 PHARMACY - ANTICOAGULATION CONSULT NOTE  Pharmacy Consult for Heparin  Indication: hx pulmonary embolus (on warfarin PTA)  Allergies  Allergen Reactions   Acetaminophen Nausea And Vomiting   Belsomra [Suvorexant] Shortness Of Breath and Rash   Codeine Shortness Of Breath   Iodinated Contrast Media Shortness Of Breath    Patient Measurements: Height: 5\' 8"  (172.7 cm) Weight: 77 kg (169 lb 12.1 oz) IBW/kg (Calculated) : 63.9 Heparin Dosing Weight: TBW  Vital Signs: Temp: 98 F (36.7 C) (03/09 1614) Temp Source: Oral (03/09 1614) BP: 119/80 (03/09 1614) Pulse Rate: 77 (03/09 0921)  Labs: Recent Labs    01/08/24 0615 01/09/24 0107 01/09/24 0341 01/09/24 1048 01/09/24 1658 01/10/24 0546 01/10/24 1847  HGB 12.7  --  12.5  --   --  12.5  --   HCT 38.5  --  37.6  --   --  37.2  --   PLT 220  --  213  --   --  222  --   LABPROT 17.8*  --   --  16.5*  --  17.3*  --   INR 1.4*  --   --  1.3*  --  1.4*  --   HEPARINUNFRC 0.84*   < >  --  0.39 0.30 0.84* 0.92*  CREATININE  --   --  1.14*  --   --  1.07*  --    < > = values in this interval not displayed.    Estimated Creatinine Clearance: 64 mL/min (A) (by C-G formula based on SCr of 1.07 mg/dL (H)).   Medical History: Past Medical History:  Diagnosis Date   Anemia    Anxiety    Arthritis    CKD (chronic kidney disease) stage 3, GFR 30-59 ml/min (HCC)    Depression    History of blood transfusion    in [place]   Hyperlipidemia    Hypertension    Lupus    PE (pulmonary embolism)    Pneumonia    x 3 - last time 2017   PTSD (post-traumatic stress disorder)    Renal disorder    Rheumatoid arthritis (HCC)    Seizures (HCC)    Stroke (HCC)    many years ago per patient, no deficits   SVD (spontaneous vaginal delivery)    x 3   TIA (transient ischemic attack)    many years ago per patient, no deficits    Medications:  Medications Prior to Admission  Medication Sig Dispense Refill Last Dose/Taking   ALPRAZolam  (XANAX) 1 MG tablet Take 1 tablet (1 mg total) by mouth 2 (two) times daily. 90 tablet 3 01/04/2024   fludrocortisone (FLORINEF) 0.1 MG tablet Take 0.5 tablets (0.05 mg total) by mouth daily. 30 tablet 1 01/05/2024 Morning   hydroxychloroquine (PLAQUENIL) 200 MG tablet Take 200 mg by mouth daily.   01/05/2024 Morning   meclizine (ANTIVERT) 25 MG tablet Take 1 tablet (25 mg total) by mouth 3 (three) times daily as needed for dizziness. 30 tablet 0 01/05/2024 Evening   metoprolol succinate (TOPROL-XL) 25 MG 24 hr tablet Take 25 mg by mouth every morning.   01/05/2024   predniSONE (DELTASONE) 5 MG tablet Take 1 tablet (5 mg total) by mouth daily with breakfast. 30 tablet 3 01/05/2024 Morning   rosuvastatin (CRESTOR) 10 MG tablet Take 10 mg by mouth in the morning.   01/05/2024 Morning   warfarin (COUMADIN) 4 MG tablet Take 4 mg by mouth daily.   01/05/2024 at  7:00 PM   zolpidem (AMBIEN) 10 MG tablet Take 1 tablet (10 mg total) by mouth at bedtime as needed for sleep. 30 tablet 3 Past Week   alendronate (FOSAMAX) 70 MG tablet Take 70 mg by mouth once a week. (Patient not taking: Reported on 12/31/2023)   Not Taking   docusate sodium (COLACE) 100 MG capsule Take 1 capsule (100 mg total) by mouth 2 (two) times daily. (Patient not taking: Reported on 12/22/2023) 10 capsule 0     Assessment: 58 yo F who is admitted s/p falls at home with LOC; diplopia also noted. PMH includes SLE, RA, HTN, HLD, CVA / TIA, hx PE on warfarin PTA, anxiety / depression, CKD3a, PTSD, migraines, and vision impairment. Pharmacy consulted to begin IV heparin while warfarin is on hold for possible procedure.   CT head neg for bleed.  Warfarin resumed 3/8.  Continue heparin until INR >2.  Heparin level now supratherapeutic on 1000 units/hr.  No bleeding noted.  Goal of Therapy:  INR 2-3 Heparin level 0.3-0.7 units/ml Monitor platelets by anticoagulation protocol: Yes   Plan:  Decrease heparin to 850 units/hr  Daily heparin level, INR, and  CBC  Thank you for allowing pharmacy to be part of this patients care team.   Dixie Dials, Pharm.D., BCPS Clinical Pharmacist  **Pharmacist phone directory can be found on [email or web address] listed under [facility].  01/10/2024 7:47 PM

## 2024-01-10 NOTE — Progress Notes (Signed)
 PROGRESS NOTE    Lauren Chung  BMW:[id number] DOB: 1965-12-07 DOA: 01/05/2024 PCP: Tally Joe, MD    Brief Narrative:  Lauren Chung is a 58 y.o. 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  [name] while trying to get from bed to bathroom.  Hospitalist has been consulted for further management of syncopal episode in the context of hypotension, recurrent fall and AKI.  Of note she was prescribed florinef on 2/27 by neurologist but she never picked up  Will ask PT to re-eval     Assessment and Plan:  Recurrent falls/Syncope Hypotension/Vertigo due to adrenal insuffiencey  -Hydrocortisone 100 mg IV in ER-- unable to get stim test done in the hospital but 2 different cortisol tests were low in the AM -check orthos daily-- improved on 3/9 -start stress dose steroids -will need outpatient endocrine follow up -Continue meclizine as needed -Physical therapy to -re evaluate-- not sure she needs SNF -Neurology consulted -LP done and unremarkable   History of prior CVA with residual deficit Patient with prior history of CVA with mild left leg weakness.   History of pulmonary embolism on chronic anticoagulation -change back to coumadin    Rheumatoid arthritis SLE Patient on rituximab in outpatient setting -Continue Plaquenil and prednisone (currently on stress dose   Anxiety -Decrease Xanax to 0.5 mg twice daily as needed   Chronic kidney disease stage IIIa -stable   hypokalemia -replete   DVT prophylaxis:  warfarin (COUMADIN) tablet 6 mg    Code Status: Full Code   Disposition Plan:  Level of care: Telemetry Medical Status is: Inpatient Remains  inpatient appropriate    Consultants:  neurology   Subjective: C/o anxiety this AM with some tremors  Objective: Vitals:   01/09/24 1627 01/09/24 2035 01/10/24 0502 01/10/24 0921  BP: 119/70 116/64 133/81 124/68  Pulse: 73 82 63 77  Resp: 18 18 18 19   Temp: 98.2 F (36.8 C) 98.3 F (36.8 C) 98.4 F (36.9 C)   TempSrc: Oral  Oral   SpO2: 94% 97% 98% 97%  Weight:      Height:        Intake/Output Summary (Last 24 hours) at 01/10/2024 1146 Last data filed at 01/10/2024 0900 Gross per 24 hour  Intake 1334.9 ml  Output 0 ml  Net 1334.9 ml   Filed Weights   01/05/24 2313  Weight: 77 kg    Examination:    General: Appearance:     Overweight female in no acute distress     Lungs:     respirations unlabored  Heart:    Normal heart rate  MS:   All extremities are intact.   Neurologic:   Awake, alert, and oriented      Data Reviewed: I have personally reviewed following labs and imaging studies  CBC: Recent Labs  Lab 01/05/24 2313 01/07/24 0833 01/08/24 0615 01/09/24 0341 01/10/24 0546  WBC 6.5 8.8 7.1 7.4 11.7*  HGB 13.1 13.2 12.7 12.5 12.5  HCT 40.8 40.5 38.5 37.6 37.2  MCV 93.8 91.2 91.4 89.7 89.9  PLT 216 223 220 213 222   Basic Metabolic Panel: Recent Labs  Lab 01/05/24 2313 01/07/24 0833 01/09/24 0341 01/10/24 0546  NA 136 139 139 138  K 4.2  4.1 3.3* 3.4*  CL 105 109 107 107  CO2 26 22 25 24   GLUCOSE 84 77 90 98  BUN 12 13 12 16   CREATININE 1.33* 1.18* 1.14* 1.07*  CALCIUM 8.2* 8.2* 8.3* 8.8*   GFR: Estimated Creatinine Clearance: 64 mL/min (A) (by C-G formula based on SCr of 1.07 mg/dL (H)). Liver Function Tests: Recent Labs  Lab 01/05/24 2313  AST 28  ALT 31  ALKPHOS 70  BILITOT 0.5  PROT 6.0*  ALBUMIN 3.2*   No results for input(s): "LIPASE", "AMYLASE" in the last 168 hours. No results for input(s): "AMMONIA" in the last 168 hours. Coagulation Profile: Recent Labs  Lab 01/06/24 1604 01/07/24 0833 01/08/24 0615  01/09/24 1048 01/10/24 0546  INR 1.6* 1.5* 1.4* 1.3* 1.4*   Cardiac Enzymes: No results for input(s): "CKTOTAL", "CKMB", "CKMBINDEX", "TROPONINI" in the last 168 hours. BNP (last 3 results) No results for input(s): "PROBNP" in the last 8760 hours. HbA1C: No results for input(s): "HGBA1C" in the last 72 hours. CBG: No results for input(s): "GLUCAP" in the last 168 hours. Lipid Profile: No results for input(s): "CHOL", "HDL", "LDLCALC", "TRIG", "CHOLHDL", "LDLDIRECT" in the last 72 hours. Thyroid Function Tests: No results for input(s): "TSH", "T4TOTAL", "FREET4", "T3FREE", "THYROIDAB" in the last 72 hours. Anemia Panel: Recent Labs    01/07/24 1828  VITAMINB12 882   Sepsis Labs: Recent Labs  Lab 01/05/24 2346  LATICACIDVEN 1.0    Recent Results (from the past 240 hours)  Respiratory (~20 pathogens) panel by PCR     Status: None   Collection Time: 01/06/24  3:24 PM   Specimen: Nasopharyngeal Swab; Respiratory  Result Value Ref Range Status   Adenovirus NOT DETECTED NOT DETECTED Final   Coronavirus 229E NOT DETECTED NOT DETECTED Final    Comment: (NOTE) The Coronavirus on the Respiratory Panel, DOES NOT test for the novel  Coronavirus (2019 nCoV)    Coronavirus HKU1 NOT DETECTED NOT DETECTED Final   Coronavirus NL63 NOT DETECTED NOT DETECTED Final   Coronavirus OC43 NOT DETECTED NOT DETECTED Final   Metapneumovirus NOT DETECTED NOT DETECTED Final   Rhinovirus / Enterovirus NOT DETECTED NOT DETECTED Final   Influenza A NOT DETECTED NOT DETECTED Final   Influenza B NOT DETECTED NOT DETECTED Final   Parainfluenza Virus 1 NOT DETECTED NOT DETECTED Final   Parainfluenza Virus 2 NOT DETECTED NOT DETECTED Final   Parainfluenza Virus 3 NOT DETECTED NOT DETECTED Final   Parainfluenza Virus 4 NOT DETECTED NOT DETECTED Final   Respiratory Syncytial Virus NOT DETECTED NOT DETECTED Final   Bordetella pertussis NOT DETECTED NOT DETECTED Final   Bordetella Parapertussis NOT  DETECTED NOT DETECTED Final   Chlamydophila pneumoniae NOT DETECTED NOT DETECTED Final   Mycoplasma pneumoniae NOT DETECTED NOT DETECTED Final    Comment: Performed at [facility], [address], [place], [place] [address]  SARS Coronavirus 2 by RT PCR (hospital order, performed in [facility] hospital lab) *cepheid single result test* Nasopharyngeal Swab     Status: None   Collection Time: 01/06/24  3:48 PM   Specimen: Nasopharyngeal Swab; Nasal Swab  Result Value Ref Range Status   SARS Coronavirus 2 by RT PCR NEGATIVE NEGATIVE Final    Comment: Performed at [facility], [address] [place], [place] [address]  CSF culture w Gram Stain     Status: None (Preliminary result)   Collection Time: 01/08/24  2:10 PM   Specimen: PATH Cytology CSF; Cerebrospinal Fluid  Result Value Ref Range Status   Specimen Description CSF  Final   Special Requests NONE  Final   Gram Stain NO WBC SEEN NO ORGANISMS SEEN CYTOSPIN SMEAR   Final   Culture   Final    NO GROWTH < 24 HOURS Performed at [facility], [address] [place], [place] [address]    Report Status PENDING  Incomplete  Culture, Fungus without Smear     Status: None (Preliminary result)   Collection Time: 01/08/24  2:10 PM   Specimen: PATH Cytology CSF; Cerebrospinal Fluid  Result Value Ref Range Status   Specimen Description CSF  Final   Special Requests NONE  Final   Culture   Final    NO GROWTH < 24 HOURS Performed at [facility], [address], [place], [place] [address]    Report Status PENDING  Incomplete         Radiology Studies: DG FL GUIDED LUMBAR PUNCTURE Result Date: 01/08/2024 CLINICAL DATA:  58 year old female with history of lupus currently on immunosuppressant presents with cognitive dysfunction. Request for fluoro guided lumbar puncture. EXAM: DIAGNOSTIC LUMBAR PUNCTURE UNDER FLUOROSCOPIC GUIDANCE COMPARISON:  CT head and C-spine, 01/05/2024. FLUOROSCOPY: Radiation Exposure  Index (as provided by the fluoroscopic device): 3.3 mGy Kerma PROCEDURE: Informed consent was obtained from the patient's daughter prior to the procedure, including potential complications and risks of headache, allergy, bleeding, infection, damage to adjacent structures, internal bleeding into spinal canal, CSF leak which may need additional procedure, and low yield. Time-out was performed to verify correct patient and correct procedure. Allergies were reviewed. With the patient prone, the lower back was prepped with Betadine. 1% Lidocaine was used for local anesthesia. Lumbar puncture was performed at the L2-L3 level using a 3.5 inch 20 gauge needle with return of clear, colorless CSF. Opening pressure was too low to be measured. 10.5 ml of CSF were obtained for laboratory studies. The spinal needle was removed, sterile dressing was placed. The patient tolerated the procedure well and there were no apparent complications. IMPRESSION: Successful fluoroscopic-guided lumbar puncture. This procedure was performed by Lawernce Ion, PA-C under supervision of Roanna Banning, MD. Electronically Signed   By: Roanna Banning M.D.   On: 01/08/2024 14:37         Scheduled Meds:  feeding supplement  237 mL Oral BID BM   hydroxychloroquine  200 mg Oral Daily   predniSONE  50 mg Oral Q breakfast   rosuvastatin  10 mg Oral q AM   sodium chloride flush  3 mL Intravenous Q12H   warfarin  6 mg Oral ONCE-1600   Warfarin - Pharmacist Dosing Inpatient   Does not apply q1600   Continuous Infusions:  heparin 1,000 Units/hr (01/10/24 1053)     LOS: 3 days    Time spent: 45 minutes spent on chart review, discussion with nursing staff, consultants, updating family and interview/physical exam; more than 50% of that time was spent in counseling and/or coordination of care.    Joseph Art, DO [facility] Available via Epic secure chat 7am-7pm After these hours, please refer to coverage provider listed on  [email or web address] 01/10/2024, 11:46 AM

## 2024-01-10 NOTE — Plan of Care (Signed)
   Problem: Health Behavior/Discharge Planning: Goal: Ability to manage health-related needs will improve Outcome: Completed/Met

## 2024-01-11 DIAGNOSIS — R42 Dizziness and giddiness: Secondary | ICD-10-CM | POA: Diagnosis not present

## 2024-01-11 LAB — CBC
HCT: 40.1 % (ref 36.0–46.0)
Hemoglobin: 13.1 g/dL (ref 12.0–15.0)
MCH: 29.6 pg (ref 26.0–34.0)
MCHC: 32.7 g/dL (ref 30.0–36.0)
MCV: 90.5 fL (ref 80.0–100.0)
Platelets: 231 10*3/uL (ref 150–400)
RBC: 4.43 MIL/uL (ref 3.87–5.11)
RDW: 13.5 % (ref 11.5–15.5)
WBC: 15.3 10*3/uL — ABNORMAL HIGH (ref 4.0–10.5)
nRBC: 0 % (ref 0.0–0.2)

## 2024-01-11 LAB — CSF CULTURE W GRAM STAIN
Culture: NO GROWTH
Gram Stain: NONE SEEN

## 2024-01-11 LAB — GLUCOSE, CAPILLARY: Glucose-Capillary: 151 mg/dL — ABNORMAL HIGH (ref 70–99)

## 2024-01-11 LAB — BASIC METABOLIC PANEL
Anion gap: 7 (ref 5–15)
BUN: 18 mg/dL (ref 6–20)
CO2: 23 mmol/L (ref 22–32)
Calcium: 8.7 mg/dL — ABNORMAL LOW (ref 8.9–10.3)
Chloride: 111 mmol/L (ref 98–111)
Creatinine, Ser: 1.11 mg/dL — ABNORMAL HIGH (ref 0.44–1.00)
GFR, Estimated: 58 mL/min — ABNORMAL LOW (ref >60)
Glucose, Bld: 110 mg/dL — ABNORMAL HIGH (ref 70–99)
Potassium: 3.4 mmol/L — ABNORMAL LOW (ref 3.5–5.1)
Sodium: 141 mmol/L (ref 135–145)

## 2024-01-11 LAB — HEPARIN LEVEL (UNFRACTIONATED)
Heparin Unfractionated: 0.66 [IU]/mL (ref 0.30–0.70)
Heparin Unfractionated: 0.53 [IU]/mL (ref 0.30–0.70)

## 2024-01-11 LAB — IGG CSF INDEX
Albumin CSF-mCnc: 15 mg/dL (ref 8–37)
Albumin: 3.6 g/dL — ABNORMAL LOW (ref 3.8–4.9)
CSF IgG Index: 0.6 (ref 0.0–0.7)
IgG (Immunoglobin G), Serum: 1121 mg/dL (ref 586–1602)
IgG, CSF: 2.6 mg/dL (ref 0.0–6.7)
IgG/Alb Ratio, CSF: 0.17 (ref 0.00–0.25)

## 2024-01-11 LAB — PROTIME-INR
INR: 1.7 — ABNORMAL HIGH (ref 0.8–1.2)
Prothrombin Time: 20.1 s — ABNORMAL HIGH (ref 11.4–15.2)

## 2024-01-11 MED ORDER — WARFARIN SODIUM 6 MG PO TABS
6.0000 mg | ORAL_TABLET | Freq: Once | ORAL | Status: AC
  Administered 2024-01-11: 6 mg via ORAL
  Filled 2024-01-11: qty 1

## 2024-01-11 MED ORDER — PREDNISONE 20 MG PO TABS
40.0000 mg | ORAL_TABLET | Freq: Every day | ORAL | Status: DC
  Administered 2024-01-12 – 2024-01-13 (×2): 40 mg via ORAL
  Filled 2024-01-11 (×2): qty 2

## 2024-01-11 MED ORDER — POTASSIUM CHLORIDE CRYS ER 20 MEQ PO TBCR
40.0000 meq | EXTENDED_RELEASE_TABLET | Freq: Once | ORAL | Status: AC
  Administered 2024-01-11: 40 meq via ORAL
  Filled 2024-01-11: qty 2

## 2024-01-11 MED ORDER — ZOLPIDEM TARTRATE 5 MG PO TABS
5.0000 mg | ORAL_TABLET | Freq: Every evening | ORAL | Status: DC | PRN
  Administered 2024-01-11: 5 mg via ORAL
  Filled 2024-01-11: qty 1

## 2024-01-11 NOTE — Progress Notes (Signed)
 Patient was complaining of dizziness and felling of passing out. Patient is shaking at the time. Her skin is warm and dry. " Am I having a seizure?"  Told her no. Stayed with the patient to kept  her calm and made MD on call aware. MD ordered to check CBG. CBG is 151. Xanax and Meclizine given.

## 2024-01-11 NOTE — Plan of Care (Signed)
  Problem: Clinical Measurements: Goal: Ability to maintain clinical measurements within normal limits will improve Outcome: Completed/Met

## 2024-01-11 NOTE — Care Management Important Message (Signed)
 Important Message  Patient Details  Name: Lauren Chung MRN: [id number] Date of Birth: 1966/08/31   Important Message Given:  Yes - Medicare IM     Dorena Bodo 01/11/2024, 4:03 PM

## 2024-01-11 NOTE — Progress Notes (Signed)
 Shaking has subsided. Patient talking to her children on the phone. Reassured patient and told her to keep me informed if it happens again.

## 2024-01-11 NOTE — Progress Notes (Signed)
 PHARMACY - ANTICOAGULATION CONSULT NOTE  Pharmacy Consult for Heparin  Indication: hx pulmonary embolus (on warfarin PTA)  Allergies  Allergen Reactions   Acetaminophen Nausea And Vomiting   Belsomra [Suvorexant] Shortness Of Breath and Rash   Codeine Shortness Of Breath   Iodinated Contrast Media Shortness Of Breath    Patient Measurements: Height: 5\' 8"  (172.7 cm) Weight: 77 kg (169 lb 12.1 oz) IBW/kg (Calculated) : 63.9 Heparin Dosing Weight: TBW  Vital Signs: Temp: 98.7 F (37.1 C) (03/09 2038) Temp Source: Oral (03/09 2038) BP: 141/81 (03/09 2038) Pulse Rate: 78 (03/09 2038)  Labs: Recent Labs    01/09/24 0341 01/09/24 1048 01/09/24 1658 01/10/24 0546 01/10/24 1847 01/11/24 0325  HGB 12.5  --   --  12.5  --  13.1  HCT 37.6  --   --  37.2  --  40.1  PLT 213  --   --  222  --  231  LABPROT  --  16.5*  --  17.3*  --  20.1*  INR  --  1.3*  --  1.4*  --  1.7*  HEPARINUNFRC  --  0.39   < > 0.84* 0.92* 0.66  CREATININE 1.14*  --   --  1.07*  --  1.11*   < > = values in this interval not displayed.    Estimated Creatinine Clearance: 61.7 mL/min (A) (by C-G formula based on SCr of 1.11 mg/dL (H)).   Medical History: Past Medical History:  Diagnosis Date   Anemia    Anxiety    Arthritis    CKD (chronic kidney disease) stage 3, GFR 30-59 ml/min (HCC)    Depression    History of blood transfusion    in [place]   Hyperlipidemia    Hypertension    Lupus    PE (pulmonary embolism)    Pneumonia    x 3 - last time 2017   PTSD (post-traumatic stress disorder)    Renal disorder    Rheumatoid arthritis (HCC)    Seizures (HCC)    Stroke (HCC)    many years ago per patient, no deficits   SVD (spontaneous vaginal delivery)    x 3   TIA (transient ischemic attack)    many years ago per patient, no deficits    Medications:  Medications Prior to Admission  Medication Sig Dispense Refill Last Dose/Taking   ALPRAZolam (XANAX) 1 MG tablet Take 1 tablet (1 mg total)  by mouth 2 (two) times daily. 90 tablet 3 01/04/2024   fludrocortisone (FLORINEF) 0.1 MG tablet Take 0.5 tablets (0.05 mg total) by mouth daily. 30 tablet 1 01/05/2024 Morning   hydroxychloroquine (PLAQUENIL) 200 MG tablet Take 200 mg by mouth daily.   01/05/2024 Morning   meclizine (ANTIVERT) 25 MG tablet Take 1 tablet (25 mg total) by mouth 3 (three) times daily as needed for dizziness. 30 tablet 0 01/05/2024 Evening   metoprolol succinate (TOPROL-XL) 25 MG 24 hr tablet Take 25 mg by mouth every morning.   01/05/2024   predniSONE (DELTASONE) 5 MG tablet Take 1 tablet (5 mg total) by mouth daily with breakfast. 30 tablet 3 01/05/2024 Morning   rosuvastatin (CRESTOR) 10 MG tablet Take 10 mg by mouth in the morning.   01/05/2024 Morning   warfarin (COUMADIN) 4 MG tablet Take 4 mg by mouth daily.   01/05/2024 at  7:00 PM   zolpidem (AMBIEN) 10 MG tablet Take 1 tablet (10 mg total) by mouth at bedtime as needed for sleep.  30 tablet 3 Past Week   alendronate (FOSAMAX) 70 MG tablet Take 70 mg by mouth once a week. (Patient not taking: Reported on 12/31/2023)   Not Taking   docusate sodium (COLACE) 100 MG capsule Take 1 capsule (100 mg total) by mouth 2 (two) times daily. (Patient not taking: Reported on 12/22/2023) 10 capsule 0     Assessment: 58 yo F who is admitted s/p falls at home with LOC; diplopia also noted. PMH includes SLE, RA, HTN, HLD, CVA / TIA, hx PE on warfarin PTA, anxiety / depression, CKD3a, PTSD, migraines, and vision impairment. Pharmacy consulted to begin IV heparin while warfarin is on hold for possible procedure.   CT head neg for bleed.  Warfarin resumed 3/8.  Continue heparin until INR >2.  Heparin level now supratherapeutic on 1000 units/hr.  No bleeding noted.  3/10 AM update:  Heparin level therapeutic   Goal of Therapy:  INR 2-3 Heparin level 0.3-0.7 units/ml Monitor platelets by anticoagulation protocol: Yes   Plan:  Cont heparin 850 units/hr Heparin level in 8 hours  Abran Duke, PharmD, BCPS Clinical Pharmacist Phone: [phone]

## 2024-01-11 NOTE — Progress Notes (Signed)
 Patient awake most of the night. Needs attended.

## 2024-01-11 NOTE — Progress Notes (Signed)
 PROGRESS NOTE    Lauren Chung  ZOX:[id number] DOB: 09/26/1966 DOA: 01/05/2024 PCP: Tally Joe, MD    Brief Narrative:  Lauren Chung is a 58 y.o. 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  [name] while trying to get from bed to bathroom.  Hospitalist has been consulted for further management of syncopal episode in the context of hypotension, recurrent fall and AKI.  Of note she was prescribed florinef on 2/27 by neurologist but she never picked up  Will ask PT to re-eval     Assessment and Plan:  Recurrent falls/Syncope Hypotension/Vertigo due to adrenal insuffiencey  -Hydrocortisone 100 mg IV in ER-- unable to get stim test done in the hospital but 2 different cortisol tests were low in the AM -check orthos daily-- improved on 3/9 -start stress dose steroids- wean as able slowly -will need outpatient endocrine follow up -Continue meclizine as needed -Physical therapy to -re evaluate-- not sure she needs SNF -Neurology consulted -LP done and unremarkable   History of prior CVA with residual deficit Patient with prior history of CVA with mild left leg weakness.   History of pulmonary embolism on chronic anticoagulation -change back to coumadin    Rheumatoid arthritis SLE Patient on rituximab in outpatient setting -Continue Plaquenil and prednisone (currently on stress dose)   Anxiety -Decrease Xanax to 0.5 mg twice daily as needed   Chronic kidney disease stage IIIa -stable   hypokalemia -replete  Insomnia -add Remus Loffler (she takes at home)  DVT prophylaxis:     Code Status: Full Code   Disposition Plan:  Level of care: Telemetry  Medical Status is: Inpatient Remains inpatient appropriate    Consultants:  neurology   Subjective: Did not sleep well last PM  Objective: Vitals:   01/11/24 0600 01/11/24 0741 01/11/24 1025 01/11/24 1037  BP: 125/72 (!) 181/75 136/75 130/81  Pulse: 79 71 68 71  Resp: 18 19    Temp: 98 F (36.7 C)     TempSrc: Oral     SpO2: 99% 95% 96% 100%  Weight:      Height:        Intake/Output Summary (Last 24 hours) at 01/11/2024 1228 Last data filed at 01/11/2024 0600 Gross per 24 hour  Intake 1107.4 ml  Output 0 ml  Net 1107.4 ml   Filed Weights   01/05/24 2313  Weight: 77 kg    Examination:    General: Appearance:     Overweight female in no acute distress     Lungs:     respirations unlabored  Heart:    Normal heart rate  MS:   All extremities are intact.   Neurologic:   Awake, alert, and oriented      Data Reviewed: I have personally reviewed following labs and imaging studies  CBC: Recent Labs  Lab 01/07/24 0833 01/08/24 0615 01/09/24 0341 01/10/24 0546 01/11/24 0325  WBC 8.8 7.1 7.4 11.7* 15.3*  HGB 13.2 12.7 12.5 12.5 13.1  HCT 40.5 38.5 37.6 37.2 40.1  MCV 91.2 91.4 89.7 89.9 90.5  PLT 223 220 213 222 231   Basic Metabolic Panel: Recent Labs  Lab 01/05/24 2313 01/07/24 0833 01/09/24 0341 01/10/24 0546 01/11/24 0325  NA 136 139 139 138 141  K 4.2 4.1 3.3* 3.4* 3.4*  CL 105 109 107 107 111  CO2 26 22 25 24 23   GLUCOSE 84 77 90 98 110*  BUN 12 13 12 16 18   CREATININE 1.33* 1.18* 1.14* 1.07* 1.11*  CALCIUM 8.2* 8.2* 8.3* 8.8* 8.7*   GFR: Estimated Creatinine Clearance: 61.7 mL/min (A) (by C-G formula based on SCr of 1.11 mg/dL (H)). Liver Function Tests: Recent Labs  Lab 01/05/24 2313  AST 28  ALT 31  ALKPHOS 70  BILITOT 0.5  PROT 6.0*  ALBUMIN 3.2*   No results for input(s): "LIPASE", "AMYLASE" in the last 168 hours. No results for input(s): "AMMONIA" in the last 168 hours. Coagulation Profile: Recent Labs  Lab  01/07/24 0833 01/08/24 0615 01/09/24 1048 01/10/24 0546 01/11/24 0325  INR 1.5* 1.4* 1.3* 1.4* 1.7*   Cardiac Enzymes: No results for input(s): "CKTOTAL", "CKMB", "CKMBINDEX", "TROPONINI" in the last 168 hours. BNP (last 3 results) No results for input(s): "PROBNP" in the last 8760 hours. HbA1C: No results for input(s): "HGBA1C" in the last 72 hours. CBG: No results for input(s): "GLUCAP" in the last 168 hours. Lipid Profile: No results for input(s): "CHOL", "HDL", "LDLCALC", "TRIG", "CHOLHDL", "LDLDIRECT" in the last 72 hours. Thyroid Function Tests: No results for input(s): "TSH", "T4TOTAL", "FREET4", "T3FREE", "THYROIDAB" in the last 72 hours. Anemia Panel: No results for input(s): "VITAMINB12", "FOLATE", "FERRITIN", "TIBC", "IRON", "RETICCTPCT" in the last 72 hours.  Sepsis Labs: Recent Labs  Lab 01/05/24 2346  LATICACIDVEN 1.0    Recent Results (from the past 240 hours)  Respiratory (~20 pathogens) panel by PCR     Status: None   Collection Time: 01/06/24  3:24 PM   Specimen: Nasopharyngeal Swab; Respiratory  Result Value Ref Range Status   Adenovirus NOT DETECTED NOT DETECTED Final   Coronavirus 229E NOT DETECTED NOT DETECTED Final    Comment: (NOTE) The Coronavirus on the Respiratory Panel, DOES NOT test for the novel  Coronavirus (2019 nCoV)    Coronavirus HKU1 NOT DETECTED NOT DETECTED Final   Coronavirus NL63 NOT DETECTED NOT DETECTED Final   Coronavirus OC43 NOT DETECTED NOT DETECTED Final   Metapneumovirus NOT DETECTED NOT DETECTED Final   Rhinovirus / Enterovirus NOT DETECTED NOT DETECTED Final   Influenza A NOT DETECTED NOT DETECTED Final   Influenza B NOT DETECTED NOT DETECTED Final   Parainfluenza Virus 1 NOT DETECTED NOT DETECTED Final   Parainfluenza Virus 2 NOT DETECTED NOT DETECTED Final   Parainfluenza Virus 3 NOT DETECTED NOT DETECTED Final   Parainfluenza Virus 4 NOT DETECTED NOT DETECTED Final   Respiratory Syncytial Virus NOT DETECTED NOT  DETECTED Final   Bordetella pertussis NOT DETECTED NOT DETECTED Final   Bordetella Parapertussis NOT DETECTED NOT DETECTED Final   Chlamydophila pneumoniae NOT DETECTED NOT DETECTED Final   Mycoplasma pneumoniae NOT DETECTED NOT DETECTED Final    Comment: Performed at [facility], [address] [place], [place] [address]  SARS Coronavirus 2 by RT PCR (hospital order, performed in [facility] hospital lab) *cepheid single result test* Nasopharyngeal Swab     Status: None   Collection Time: 01/06/24  3:48 PM   Specimen: Nasopharyngeal Swab; Nasal Swab  Result Value Ref Range Status   SARS Coronavirus 2 by RT PCR NEGATIVE NEGATIVE Final    Comment: Performed at [facility], [address], [place], [place] [address]  CSF culture w Gram Stain     Status: None   Collection Time: 01/08/24  2:10 PM   Specimen: PATH Cytology CSF; Cerebrospinal Fluid  Result Value Ref Range Status   Specimen Description CSF  Final   Special Requests NONE  Final   Gram Stain NO WBC SEEN NO ORGANISMS SEEN CYTOSPIN SMEAR   Final   Culture   Final    NO GROWTH 3 DAYS Performed at [facility], [address], [place], [place] [address]    Report Status 01/11/2024 FINAL  Final  Culture, Fungus without Smear     Status: None (Preliminary result)   Collection Time: 01/08/24  2:10 PM   Specimen: PATH Cytology CSF; Cerebrospinal Fluid  Result Value Ref Range Status   Specimen Description CSF  Final   Special Requests NONE  Final   Culture   Final    NO FUNGUS ISOLATED AFTER 15 DAYS Performed at [facility], [address] [place], [place] [address]    Report Status PENDING  Incomplete         Radiology Studies: No results found.        Scheduled Meds:  feeding supplement  237 mL Oral BID BM   hydroxychloroquine  200 mg Oral Daily   melatonin  3 mg Oral QHS   predniSONE  50 mg Oral Q breakfast   rosuvastatin  10 mg Oral q AM   sodium chloride flush  3 mL  Intravenous Q12H   Warfarin - Pharmacist Dosing Inpatient   Does not apply q1600   Continuous Infusions:  heparin 850 Units/hr (01/10/24 2141)     LOS: 4 days    Time spent: 45 minutes spent on chart review, discussion with nursing staff, consultants, updating family and interview/physical exam; more than 50% of that time was spent in counseling and/or coordination of care.    Joseph Art, DO [facility] Available via Epic secure chat 7am-7pm After these hours, please refer to coverage provider listed on [email or web address] 01/11/2024, 12:28 PM

## 2024-01-11 NOTE — Progress Notes (Signed)
 Physical Therapy Treatment Patient Details Name: Lauren Chung MRN: [id number] DOB: Sep 09, 1966 Today's Date: 01/11/2024   History of Present Illness Pt is 58 yo female presented on 3/4 after 2 falls at home with head trauma, hit the back of her head against the carpeted floor.  [name] while trying to get from the bed to the bathroom.  Reportedly soft blood pressure with EMS.  Admit on 12/22/23 with dizziness and fatigue with ADLs after recent admission 2/9-2/14/25 following syncopal episode. Pt noted epsiodes of significant dizziness and activity intolerance after returning home.PMH:  lupus, seronegative rheumatoid arthritis, HTN, HLD, anxiety and depression, CKD stage IIIa, PTSD, history of CVA, seizure disorder.    PT Comments  Patient dramatically better! Able to progress to ambulation in hallway despite drop in BP from sitting to standing (see vitals flowsheet for orthostatics). Patient denied symptoms of dizziness or lightheadedness throughout session, however reports sometimes at home it is so bad that she can barely walk with a RW. She feels for her worst days a wheelchair would be beneficial to reduce her risk of falling. Will continue to follow acutely, however no longer needs post-acute PT services.     If plan is discharge home, recommend the following: A little help with walking and/or transfers;A little help with bathing/dressing/bathroom;Assistance with cooking/housework;Help with stairs or ramp for entrance;Assist for transportation   Can travel by private vehicle     Yes  Equipment Recommendations  Wheelchair (measurements PT) (for "bad days" to help reduce falls)    Recommendations for Other Services       Precautions / Restrictions Precautions Precautions: Fall Precaution/Restrictions Comments: syncope. dizziness Restrictions Weight Bearing Restrictions Per Provider Order: No     Mobility  Bed Mobility               General bed mobility comments: sitting EOB  independently on arrival; able to don socks without difficulty    Transfers Overall transfer level: Needs assistance Equipment used: None Transfers: Sit to/from Stand Sit to Stand: Supervision           General transfer comment: supervision for safety due to h/o falls and vertigo; pt asymptomatic    Ambulation/Gait Ambulation/Gait assistance: Contact guard assist Gait Distance (Feet): 180 Feet Assistive device: None Gait Pattern/deviations: Decreased stride length, Step-through pattern Gait velocity: dec     General Gait Details: decreased cadence   Stairs             Wheelchair Mobility     Tilt Bed    Modified Rankin (Stroke Patients Only) Modified Rankin (Stroke Patients Only) Pre-Morbid Rankin Score: Moderate disability Modified Rankin: Moderate disability     Balance Overall balance assessment: Needs assistance Sitting-balance support: Feet supported, Single extremity supported, Bilateral upper extremity supported Sitting balance-Leahy Scale: Good Sitting balance - Comments: no sway; able to reach down to feet to don socks   Standing balance support: No upper extremity supported Standing balance-Leahy Scale: Fair                              Hotel manager: No apparent difficulties  Cognition Arousal: Alert Behavior During Therapy: WFL for tasks assessed/performed   PT - Cognitive impairments: No apparent impairments                         Following commands: Intact      Cueing Cueing Techniques: Verbal cues, Gestural cues, Tactile cues,  Visual cues  Exercises      General Comments General comments (skin integrity, edema, etc.): see vitals flowsheet for orthostatic vitals (pt asymptomatic throughout). Patient inquiring about a wheelchair for "bad days" when RW does not offer enough support.      Pertinent Vitals/Pain Pain Assessment Pain Assessment: Faces Faces Pain Scale: Hurts  little more Pain Location: headache Pain Descriptors / Indicators: Tiring, Discomfort Pain Intervention(s): Limited activity within patient's tolerance, Monitored during session, Premedicated before session    Home Living                          Prior Function            PT Goals (current goals can now be found in the care plan section) Acute Rehab PT Goals Patient Stated Goal: improve activity tolerance PT Goal Formulation: With patient Time For Goal Achievement: 01/25/24 Potential to Achieve Goals: Good Progress towards PT goals: Goals met and updated - see care plan    Frequency    Min 2X/week      PT Plan      Co-evaluation              AM-PAC PT "6 Clicks" Mobility   Outcome Measure  Help needed turning from your back to your side while in a flat bed without using bedrails?: None Help needed moving from lying on your back to sitting on the side of a flat bed without using bedrails?: None Help needed moving to and from a bed to a chair (including a wheelchair)?: A Little Help needed standing up from a chair using your arms (e.g., wheelchair or bedside chair)?: A Little Help needed to walk in hospital room?: A Little Help needed climbing 3-5 steps with a railing? : A Little 6 Click Score: 20    End of Session Equipment Utilized During Treatment: Gait belt Activity Tolerance: Patient tolerated treatment well Patient left: with call bell/phone within reach;in bed (on stretcher) Nurse Communication: Mobility status PT Visit Diagnosis: Unsteadiness on feet (R26.81);Difficulty in walking, not elsewhere classified (R26.2);Dizziness and giddiness (R42)     Time: 1610-9604 PT Time Calculation (min) (ACUTE ONLY): 15 min  Charges:    $Gait Training: 8-22 mins PT General Charges $$ ACUTE PT VISIT: 1 Visit                      Jerolyn Center, PT Acute Rehabilitation Services  Office [phone]    Zena Amos 01/11/2024, 1:49 PM

## 2024-01-11 NOTE — Progress Notes (Signed)
 PHARMACY - ANTICOAGULATION CONSULT NOTE  Pharmacy Consult for Heparin and Warfarin Indication: hx pulmonary embolus  Allergies  Allergen Reactions   Acetaminophen Nausea And Vomiting   Belsomra [Suvorexant] Shortness Of Breath and Rash   Codeine Shortness Of Breath   Iodinated Contrast Media Shortness Of Breath    Patient Measurements: Height: 5\' 8"  (172.7 cm) Weight: 77 kg (169 lb 12.1 oz) IBW/kg (Calculated) : 63.9 Heparin Dosing Weight: 77 kg  Vital Signs: Temp: 98 F (36.7 C) (03/10 0600) Temp Source: Oral (03/10 0600) BP: 130/81 (03/10 1037) Pulse Rate: 71 (03/10 1037)  Labs: Recent Labs    01/09/24 0341 01/09/24 1048 01/09/24 1658 01/10/24 0546 01/10/24 1847 01/11/24 0325 01/11/24 1235  HGB 12.5  --   --  12.5  --  13.1  --   HCT 37.6  --   --  37.2  --  40.1  --   PLT 213  --   --  222  --  231  --   LABPROT  --  16.5*  --  17.3*  --  20.1*  --   INR  --  1.3*  --  1.4*  --  1.7*  --   HEPARINUNFRC  --  0.39   < > 0.84* 0.92* 0.66 0.53  CREATININE 1.14*  --   --  1.07*  --  1.11*  --    < > = values in this interval not displayed.    Estimated Creatinine Clearance: 61.7 mL/min (A) (by C-G formula based on SCr of 1.11 mg/dL (H)).  Assessment: 58 yo F who is admitted s/p falls at home with LOC; diplopia also noted. PMH includes SLE, RA, HTN, HLD, CVA / TIA, hx PE on warfarin PTA, anxiety / depression, CKD3a, PTSD, migraines, and vision impairment. CT head negative for bleed. Pharmacy consulted to begin IV heparin on 01/06/24 while warfarin was on hold for LP, performed on 01/08/24. Warfarin resumed 3/8. Continue heparin until INR >2.   Heparin level remains therapeutic (0.53) on 850 units/hr. INR trended up to 1.7 after Warfarin 5 mg x 1 day, then 6 mg x 2 days.  PTA warfarin: 4 mg daily  Goal of Therapy:  INR 2-3 Heparin level 0.3-0.7 units/ml Monitor platelets by anticoagulation protocol: Yes   Plan:  Continue heparin drip at 850 units/hr. Warfarin 6  mg x 1 again today. Daily heparin level, PT/INR and CBC. Stop heparin when INR >2.  Dennie Fetters, RPh 01/11/2024,1:22 PM

## 2024-01-12 DIAGNOSIS — R42 Dizziness and giddiness: Secondary | ICD-10-CM | POA: Diagnosis not present

## 2024-01-12 LAB — CBC
HCT: 35.4 % — ABNORMAL LOW (ref 36.0–46.0)
Hemoglobin: 12 g/dL (ref 12.0–15.0)
MCH: 29.9 pg (ref 26.0–34.0)
MCHC: 33.9 g/dL (ref 30.0–36.0)
MCV: 88.3 fL (ref 80.0–100.0)
Platelets: 239 10*3/uL (ref 150–400)
RBC: 4.01 MIL/uL (ref 3.87–5.11)
RDW: 13.7 % (ref 11.5–15.5)
WBC: 13.8 10*3/uL — ABNORMAL HIGH (ref 4.0–10.5)
nRBC: 0 % (ref 0.0–0.2)

## 2024-01-12 LAB — HEPARIN LEVEL (UNFRACTIONATED): Heparin Unfractionated: 0.4 [IU]/mL (ref 0.30–0.70)

## 2024-01-12 LAB — PROTIME-INR
INR: 2.2 — ABNORMAL HIGH (ref 0.8–1.2)
Prothrombin Time: 24.6 s — ABNORMAL HIGH (ref 11.4–15.2)

## 2024-01-12 MED ORDER — WARFARIN SODIUM 4 MG PO TABS
4.0000 mg | ORAL_TABLET | Freq: Every day | ORAL | Status: DC
  Administered 2024-01-12: 4 mg via ORAL
  Filled 2024-01-12 (×2): qty 1

## 2024-01-12 MED ORDER — ZOLPIDEM TARTRATE 5 MG PO TABS
10.0000 mg | ORAL_TABLET | Freq: Every evening | ORAL | Status: DC | PRN
  Administered 2024-01-12: 10 mg via ORAL
  Filled 2024-01-12: qty 2

## 2024-01-12 NOTE — Progress Notes (Signed)
 Mobility Specialist Progress Note:    01/12/24 0900  Orthostatic Lying   BP- Lying 122/80  Orthostatic Sitting  BP- Sitting 124/73  Orthostatic Standing at 0 minutes  BP- Standing at 0 minutes 112/77  Mobility  Activity Ambulated with assistance in hallway  Level of Assistance Standby assist, set-up cues, supervision of patient - no hands on  Assistive Device Other (Comment) (IV Pole)  Distance Ambulated (ft) 180 ft  Activity Response Tolerated well  Mobility Referral Yes  Mobility visit 1 Mobility  Mobility Specialist Start Time (ACUTE ONLY) 0907  Mobility Specialist Stop Time (ACUTE ONLY) 0920  Mobility Specialist Time Calculation (min) (ACUTE ONLY) 13 min   Pt received in bed and agreeable. Orthostatic vitals taken, see above. Asymptomatic w/ no complaints throughout. Pt left on EOB with call bell and all needs met. Bed alarm on.  D'Vante Earlene Plater Mobility Specialist Please contact via Special educational needs teacher or Rehab office at [phone]

## 2024-01-12 NOTE — TOC Progression Note (Signed)
 Transition of Care [facility]) - Progression Note    Patient Details  Name: Jamillia Closson MRN: [id number] Date of Birth: [birth year]  Transition of Care [facility]) CM/SW Contact  Tom-Johnson, Hershal Coria, RN Phone Number: 01/12/2024, 3:10 PM  Clinical Narrative:     PT updated there recommendation with no f/u needed. CM spoke with patient t bedside and she states she felt she is almost back to baseline and does not need home health at this time. States her children will assist if needed.   CM will continue to follow as patient progresses with care towards discharge.         Expected Discharge Plan: Skilled Nursing Facility Barriers to Discharge: Continued Medical Work up, SNF Pending bed offer, English as a second language teacher  Expected Discharge Plan and Services     Post Acute Care Choice: Skilled Nursing Facility Living arrangements for the past 2 months: Single Family Home                                       Social Determinants of Health (SDOH) Interventions SDOH Screenings   Food Insecurity: Food Insecurity Present (01/06/2024)  Housing: Low Risk  (01/06/2024)  Transportation Needs: No Transportation Needs (01/06/2024)  Utilities: At Risk (01/06/2024)  Social Connections: Socially Isolated (01/06/2024)  Tobacco Use: Low Risk  (01/06/2024)    Readmission Risk Interventions     No data to display

## 2024-01-12 NOTE — Plan of Care (Signed)
  Problem: Clinical Measurements: Goal: Will remain free from infection Outcome: Completed/Met Goal: Diagnostic test results will improve Outcome: Completed/Met Goal: Respiratory complications will improve Outcome: Completed/Met Goal: Cardiovascular complication will be avoided Outcome: Completed/Met

## 2024-01-12 NOTE — Progress Notes (Signed)
 PHARMACY - ANTICOAGULATION CONSULT NOTE  Pharmacy Consult for Heparin and Warfarin Indication: hx pulmonary embolus  Allergies  Allergen Reactions   Acetaminophen Nausea And Vomiting   Belsomra [Suvorexant] Shortness Of Breath and Rash   Codeine Shortness Of Breath   Iodinated Contrast Media Shortness Of Breath    Patient Measurements: Height: 5\' 8"  (172.7 cm) Weight: 77 kg (169 lb 12.1 oz) IBW/kg (Calculated) : 63.9 Heparin Dosing Weight: 77 kg  Vital Signs: Temp: 98.3 F (36.8 C) (03/11 0845) Temp Source: Oral (03/11 0845) BP: 116/82 (03/11 0845) Pulse Rate: 85 (03/11 0845)  Labs: Recent Labs    01/10/24 0546 01/10/24 1847 01/11/24 0325 01/11/24 1235 01/12/24 0347  HGB 12.5  --  13.1  --  12.0  HCT 37.2  --  40.1  --  35.4*  PLT 222  --  231  --  239  LABPROT 17.3*  --  20.1*  --  24.6*  INR 1.4*  --  1.7*  --  2.2*  HEPARINUNFRC 0.84*   < > 0.66 0.53 0.40  CREATININE 1.07*  --  1.11*  --   --    < > = values in this interval not displayed.    Estimated Creatinine Clearance: 61.7 mL/min (A) (by C-G formula based on SCr of 1.11 mg/dL (H)).  Assessment: 58 yo F who is admitted s/p falls at home with LOC; diplopia also noted. PMH includes SLE, RA, HTN, HLD, CVA / TIA, hx PE on warfarin PTA, anxiety / depression, CKD3a, PTSD, migraines, and vision impairment. CT head negative for bleed. Pharmacy consulted to begin IV heparin on 01/06/24 while warfarin was on hold for LP, performed on 01/08/24. Warfarin resumed 3/8. Continue heparin until INR >2.   Heparin level remains therapeutic (0.40) on 850 units/hr. INR trended up to 2.2 after Warfarin 5 mg x 1 day, then 6 mg x 3 days. CBC stable.  PTA warfarin: 4 mg daily  Goal of Therapy:  INR 2-3 Heparin level 0.3-0.7 units/ml Monitor platelets by anticoagulation protocol: Yes   Plan:  Discontinue heparin drip. Resume Warfarin 4 mg daily. Daily PT/INR and CBC.  Dennie Fetters, RPh 01/12/2024,8:48 AM

## 2024-01-12 NOTE — Progress Notes (Signed)
 PROGRESS NOTE    Lauren Chung  RUE:[id number] DOB: 11/25/1965 DOA: 01/05/2024 PCP: Tally Joe, MD    Brief Narrative:  Lauren Chung is a 58 y.o. 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  [name] while trying to get from bed to bathroom.  Hospitalist has been consulted for further management of syncopal episode in the context of hypotension, recurrent fall and AKI.  Of note she was prescribed florinef on 2/27 by neurologist but she never picked up  Home in next 24 hours if no issues on tele/with orthostatics     Assessment and Plan:  Recurrent falls/Syncope Hypotension/Vertigo due to adrenal insuffiencey  -Hydrocortisone 100 mg IV in ER-- unable to get stim test done in the hospital but 2 different cortisol tests were low  -check orthos daily-- improved on 3/9 -start stress dose steroids- wean as able slowly -will need outpatient endocrine follow up -Continue meclizine as needed -Neurology consulted- suspected adrenal insuff, doubt seizures etc -LP done and unremarkable   History of prior CVA with residual deficit Patient with prior history of CVA with mild left leg weakness.   History of pulmonary embolism on chronic anticoagulation -change back to coumadin  -INR >2   Rheumatoid arthritis SLE Patient on rituximab in outpatient setting -Continue Plaquenil and prednisone (currently on stress dose)   Anxiety -Decrease Xanax to 0.5 mg twice daily as needed   Chronic kidney disease stage IIIa -stable   hypokalemia -replete  Insomnia -add Remus Loffler (she takes at home)  DVT prophylaxis:  warfarin (COUMADIN) tablet 4 mg    Code Status: Full  Code   Disposition Plan:  Level of care: Telemetry Medical Status is: Inpatient Remains inpatient appropriate    Consultants:  neurology   Subjective: Last PM had episode of heart racing and feeling dizzy  Objective: Vitals:   01/11/24 1520 01/11/24 1933 01/12/24 0459 01/12/24 0845  BP: (!) 147/87 (!) 149/78 (!) 115/90 116/82  Pulse: 80 (!) 103 70 85  Resp: 18 20 18 17   Temp: 98.1 F (36.7 C) 98 F (36.7 C) 98 F (36.7 C) 98.3 F (36.8 C)  TempSrc: Oral Oral Oral Oral  SpO2: 100% 100% 100% 97%  Weight:      Height:        Intake/Output Summary (Last 24 hours) at 01/12/2024 1225 Last data filed at 01/12/2024 0600 Gross per 24 hour  Intake 676.47 ml  Output 0 ml  Net 676.47 ml   Filed Weights   01/05/24 2313  Weight: 77 kg    Examination:    General: Appearance:     Overweight female in no acute distress     Lungs:     respirations unlabored  Heart:    Normal heart rate  MS:   All extremities are intact.   Neurologic:   Awake, alert, and oriented      Data Reviewed: I have personally reviewed following labs and imaging studies  CBC: Recent Labs  Lab 01/08/24 0615 01/09/24 0341 01/10/24 0546 01/11/24 0325 01/12/24 0347  WBC 7.1 7.4 11.7* 15.3* 13.8*  HGB 12.7 12.5 12.5 13.1 12.0  HCT 38.5 37.6 37.2 40.1 35.4*  MCV 91.4 89.7 89.9 90.5 88.3  PLT 220 213 222 231 239   Basic  Metabolic Panel: Recent Labs  Lab 01/05/24 2313 01/07/24 0833 01/09/24 0341 01/10/24 0546 01/11/24 0325  NA 136 139 139 138 141  K 4.2 4.1 3.3* 3.4* 3.4*  CL 105 109 107 107 111  CO2 26 22 25 24 23   GLUCOSE 84 77 90 98 110*  BUN 12 13 12 16 18   CREATININE 1.33* 1.18* 1.14* 1.07* 1.11*  CALCIUM 8.2* 8.2* 8.3* 8.8* 8.7*   GFR: Estimated Creatinine Clearance: 61.7 mL/min (A) (by C-G formula based on SCr of 1.11 mg/dL (H)). Liver Function Tests: Recent Labs  Lab 01/05/24 2313 01/08/24 1410  AST 28  --   ALT 31  --   ALKPHOS 70  --   BILITOT 0.5  --   PROT  6.0*  --   ALBUMIN 3.2* 3.6*   No results for input(s): "LIPASE", "AMYLASE" in the last 168 hours. No results for input(s): "AMMONIA" in the last 168 hours. Coagulation Profile: Recent Labs  Lab 01/08/24 0615 01/09/24 1048 01/10/24 0546 01/11/24 0325 01/12/24 0347  INR 1.4* 1.3* 1.4* 1.7* 2.2*   Cardiac Enzymes: No results for input(s): "CKTOTAL", "CKMB", "CKMBINDEX", "TROPONINI" in the last 168 hours. BNP (last 3 results) No results for input(s): "PROBNP" in the last 8760 hours. HbA1C: No results for input(s): "HGBA1C" in the last 72 hours. CBG: Recent Labs  Lab 01/11/24 2000  GLUCAP 151*   Lipid Profile: No results for input(s): "CHOL", "HDL", "LDLCALC", "TRIG", "CHOLHDL", "LDLDIRECT" in the last 72 hours. Thyroid Function Tests: No results for input(s): "TSH", "T4TOTAL", "FREET4", "T3FREE", "THYROIDAB" in the last 72 hours. Anemia Panel: No results for input(s): "VITAMINB12", "FOLATE", "FERRITIN", "TIBC", "IRON", "RETICCTPCT" in the last 72 hours.  Sepsis Labs: Recent Labs  Lab 01/05/24 2346  LATICACIDVEN 1.0    Recent Results (from the past 240 hours)  Respiratory (~20 pathogens) panel by PCR     Status: None   Collection Time: 01/06/24  3:24 PM   Specimen: Nasopharyngeal Swab; Respiratory  Result Value Ref Range Status   Adenovirus NOT DETECTED NOT DETECTED Final   Coronavirus 229E NOT DETECTED NOT DETECTED Final    Comment: (NOTE) The Coronavirus on the Respiratory Panel, DOES NOT test for the novel  Coronavirus (2019 nCoV)    Coronavirus HKU1 NOT DETECTED NOT DETECTED Final   Coronavirus NL63 NOT DETECTED NOT DETECTED Final   Coronavirus OC43 NOT DETECTED NOT DETECTED Final   Metapneumovirus NOT DETECTED NOT DETECTED Final   Rhinovirus / Enterovirus NOT DETECTED NOT DETECTED Final   Influenza A NOT DETECTED NOT DETECTED Final   Influenza B NOT DETECTED NOT DETECTED Final   Parainfluenza Virus 1 NOT DETECTED NOT DETECTED Final   Parainfluenza Virus 2  NOT DETECTED NOT DETECTED Final   Parainfluenza Virus 3 NOT DETECTED NOT DETECTED Final   Parainfluenza Virus 4 NOT DETECTED NOT DETECTED Final   Respiratory Syncytial Virus NOT DETECTED NOT DETECTED Final   Bordetella pertussis NOT DETECTED NOT DETECTED Final   Bordetella Parapertussis NOT DETECTED NOT DETECTED Final   Chlamydophila pneumoniae NOT DETECTED NOT DETECTED Final   Mycoplasma pneumoniae NOT DETECTED NOT DETECTED Final    Comment: Performed at [facility], [address] [place], [place] [address]  SARS Coronavirus 2 by RT PCR (hospital order, performed in [facility] hospital lab) *cepheid single result test* Nasopharyngeal Swab     Status: None   Collection Time: 01/06/24  3:48 PM   Specimen: Nasopharyngeal Swab; Nasal Swab  Result Value Ref Range Status   SARS  Coronavirus 2 by RT PCR NEGATIVE NEGATIVE Final    Comment: Performed at [facility], [address], [place], [place] [address]  CSF culture w Gram Stain     Status: None   Collection Time: 01/08/24  2:10 PM   Specimen: PATH Cytology CSF; Cerebrospinal Fluid  Result Value Ref Range Status   Specimen Description CSF  Final   Special Requests NONE  Final   Gram Stain NO WBC SEEN NO ORGANISMS SEEN CYTOSPIN SMEAR   Final   Culture   Final    NO GROWTH 3 DAYS Performed at [facility], [address], [place], [place] [address]    Report Status 01/11/2024 FINAL  Final  Culture, Fungus without Smear     Status: None (Preliminary result)   Collection Time: 01/08/24  2:10 PM   Specimen: PATH Cytology CSF; Cerebrospinal Fluid  Result Value Ref Range Status   Specimen Description CSF  Final   Special Requests NONE  Final   Culture   Final    NO GROWTH 4 DAYS Performed at [facility], [address], [place], [place] [address]    Report Status PENDING  Incomplete         Radiology Studies: No results found.        Scheduled Meds:  feeding supplement  237 mL Oral BID BM    hydroxychloroquine  200 mg Oral Daily   melatonin  3 mg Oral QHS   predniSONE  40 mg Oral Q breakfast   rosuvastatin  10 mg Oral q AM   sodium chloride flush  3 mL Intravenous Q12H   warfarin  4 mg Oral q1600   Warfarin - Pharmacist Dosing Inpatient   Does not apply q1600   Continuous Infusions:     LOS: 5 days    Time spent: 45 minutes spent on chart review, discussion with nursing staff, consultants, updating family and interview/physical exam; more than 50% of that time was spent in counseling and/or coordination of care.    Joseph Art, DO [facility] Available via Epic secure chat 7am-7pm After these hours, please refer to coverage provider listed on [email or web address] 01/12/2024, 12:25 PM

## 2024-01-12 NOTE — Progress Notes (Signed)
 Occupational Therapy Treatment/Discharge Patient Details Name: Lauren Chung MRN: [id number] DOB: 14-Jul-1966 Today's Date: 01/12/2024   History of present illness Pt is 57 yo female presented on 3/4 after 2 falls at home with head trauma, hit the back of her head against the carpeted floor.  [name] while trying to get from the bed to the bathroom.  Reportedly soft blood pressure with EMS.  Admit on 12/22/23 with dizziness and fatigue with ADLs after recent admission 2/9-2/14/25 following syncopal episode. Pt noted epsiodes of significant dizziness and activity intolerance after returning home.PMH:  lupus, seronegative rheumatoid arthritis, HTN, HLD, anxiety and depression, CKD stage IIIa, PTSD, history of CVA, seizure disorder.   OT comments  Pt states she feels much better than prior days. Pt able to complete all ADLs and mobility, ambulation 200 feet all mod I, used RW for hallway and gait belt for safety due to history of vertigo. Pt eager to return home, states she has RW and daughter is getting a shower chair. Pt has no further acute OT or follow up needs.       If plan is discharge home, recommend the following:  Assist for transportation;Help with stairs or ramp for entrance   Equipment Recommendations  Tub/shower bench    Recommendations for Other Services      Precautions / Restrictions Precautions Precautions: Fall Recall of Precautions/Restrictions: Intact Restrictions Weight Bearing Restrictions Per Provider Order: No       Mobility Bed Mobility Overal bed mobility: Modified Independent                  Transfers Overall transfer level: Modified independent Equipment used: None               General transfer comment: no LOB, supervision for safety but standing/ambulating independently with/without RW     Balance Overall balance assessment: Modified Independent                                         ADL either performed or assessed  with clinical judgement   ADL Overall ADL's : Modified independent                                       General ADL Comments: mod I with RW    Extremity/Trunk Assessment Upper Extremity Assessment Upper Extremity Assessment: Overall WFL for tasks assessed RUE Deficits / Details: h/o dislocation with fear of picking up over 90 degrees.            Vision       Perception     Praxis     Communication Communication Communication: No apparent difficulties   Cognition Arousal: Alert Behavior During Therapy: WFL for tasks assessed/performed Cognition: No apparent impairments                               Following commands: Intact        Cueing      Exercises      Shoulder Instructions       General Comments      Pertinent Vitals/ Pain       Pain Assessment Pain Assessment: No/denies pain  Home Living  Prior Functioning/Environment              Frequency  Min 2X/week        Progress Toward Goals  OT Goals(current goals can now be found in the care plan section)  Progress towards OT goals: Goals met/education completed, patient discharged from OT  Acute Rehab OT Goals Patient Stated Goal: to return home OT Goal Formulation: With patient Time For Goal Achievement: 01/21/24 Potential to Achieve Goals: Good ADL Goals Pt Will Perform Grooming: sitting;with set-up Pt Will Perform Upper Body Bathing: with set-up;with modified independence Pt Will Perform Lower Body Bathing: with contact guard assist;with supervision Pt Will Perform Upper Body Dressing: with set-up;with modified independence Pt Will Perform Lower Body Dressing: sit to/from stand;with contact guard assist Pt Will Transfer to Toilet: with supervision;squat pivot transfer Pt Will Perform Toileting - Clothing Manipulation and hygiene: with supervision;with modified independence;sit to/from  stand Pt Will Perform Tub/Shower Transfer: with min assist;with contact guard assist;ambulating;shower seat Additional ADL Goal #1: Pt will Indepenedently complete gaze stabilization exercies to reduce onset of dizziness Additional ADL Goal #4: Pt will sit EOB >5 mins with Supervision in preparation for seated ADLs.  Plan      Co-evaluation                 AM-PAC OT "6 Clicks" Daily Activity     Outcome Measure   Help from another person eating meals?: None Help from another person taking care of personal grooming?: None Help from another person toileting, which includes using toliet, bedpan, or urinal?: None Help from another person bathing (including washing, rinsing, drying)?: None Help from another person to put on and taking off regular upper body clothing?: None Help from another person to put on and taking off regular lower body clothing?: None 6 Click Score: 24    End of Session Equipment Utilized During Treatment: Gait belt;Rolling walker (2 wheels)  OT Visit Diagnosis: Unsteadiness on feet (R26.81);History of falling (Z91.81);Low vision, both eyes (H54.2)   Activity Tolerance Patient tolerated treatment well   Patient Left in bed;with call bell/phone within reach   Nurse Communication Mobility status        Time: 9147-8295 OT Time Calculation (min): 10 min  Charges: OT General Charges $OT Visit: 1 Visit OT Treatments $Self Care/Home Management : 8-22 mins  [place], OTR/L   Alexis Goodell 01/12/2024, 12:42 PM

## 2024-01-13 ENCOUNTER — Other Ambulatory Visit (HOSPITAL_COMMUNITY): Payer: Self-pay

## 2024-01-13 ENCOUNTER — Ambulatory Visit (HOSPITAL_COMMUNITY): Payer: 59 | Admitting: Psychiatry

## 2024-01-13 DIAGNOSIS — R42 Dizziness and giddiness: Secondary | ICD-10-CM | POA: Diagnosis not present

## 2024-01-13 LAB — CBC
HCT: 36.1 % (ref 36.0–46.0)
Hemoglobin: 12.2 g/dL (ref 12.0–15.0)
MCH: 29.8 pg (ref 26.0–34.0)
MCHC: 33.8 g/dL (ref 30.0–36.0)
MCV: 88 fL (ref 80.0–100.0)
Platelets: 248 10*3/uL (ref 150–400)
RBC: 4.1 MIL/uL (ref 3.87–5.11)
RDW: 14.2 % (ref 11.5–15.5)
WBC: 12.9 10*3/uL — ABNORMAL HIGH (ref 4.0–10.5)
nRBC: 0 % (ref 0.0–0.2)

## 2024-01-13 LAB — PROTIME-INR
INR: 2.5 — ABNORMAL HIGH (ref 0.8–1.2)
Prothrombin Time: 27.1 s — ABNORMAL HIGH (ref 11.4–15.2)

## 2024-01-13 LAB — BASIC METABOLIC PANEL
Anion gap: 6 (ref 5–15)
BUN: 19 mg/dL (ref 6–20)
CO2: 25 mmol/L (ref 22–32)
Calcium: 8.8 mg/dL — ABNORMAL LOW (ref 8.9–10.3)
Chloride: 109 mmol/L (ref 98–111)
Creatinine, Ser: 1.12 mg/dL — ABNORMAL HIGH (ref 0.44–1.00)
GFR, Estimated: 58 mL/min — ABNORMAL LOW (ref >60)
Glucose, Bld: 86 mg/dL (ref 70–99)
Potassium: 3.1 mmol/L — ABNORMAL LOW (ref 3.5–5.1)
Sodium: 140 mmol/L (ref 135–145)

## 2024-01-13 MED ORDER — POTASSIUM CHLORIDE CRYS ER 20 MEQ PO TBCR
40.0000 meq | EXTENDED_RELEASE_TABLET | Freq: Three times a day (TID) | ORAL | Status: DC
  Administered 2024-01-13: 40 meq via ORAL
  Filled 2024-01-13: qty 2

## 2024-01-13 MED ORDER — ALPRAZOLAM 0.5 MG PO TABS
0.5000 mg | ORAL_TABLET | Freq: Two times a day (BID) | ORAL | 0 refills | Status: DC | PRN
  Filled 2024-01-13: qty 12, 6d supply, fill #0

## 2024-01-13 MED ORDER — PREDNISONE 10 MG PO TABS
ORAL_TABLET | ORAL | 0 refills | Status: AC
  Filled 2024-01-13: qty 50, 32d supply, fill #0

## 2024-01-13 NOTE — Progress Notes (Signed)
 PHARMACY - ANTICOAGULATION CONSULT NOTE  Pharmacy Consult for Warfarin Indication: hx pulmonary embolus  Allergies  Allergen Reactions   Acetaminophen Nausea And Vomiting   Belsomra [Suvorexant] Shortness Of Breath and Rash   Codeine Shortness Of Breath   Iodinated Contrast Media Shortness Of Breath    Patient Measurements: Height: 5\' 8"  (172.7 cm) Weight: 77 kg (169 lb 12.1 oz) IBW/kg (Calculated) : 63.9  Vital Signs: Temp: 98.3 F (36.8 C) (03/12 0743) Temp Source: Oral (03/12 0743) BP: 126/76 (03/12 0743) Pulse Rate: 74 (03/12 0743)  Labs: Recent Labs    01/11/24 0325 01/11/24 1235 01/12/24 0347 01/13/24 0341  HGB 13.1  --  12.0 12.2  HCT 40.1  --  35.4* 36.1  PLT 231  --  239 248  LABPROT 20.1*  --  24.6* 27.1*  INR 1.7*  --  2.2* 2.5*  HEPARINUNFRC 0.66 0.53 0.40  --   CREATININE 1.11*  --   --  1.12*    Estimated Creatinine Clearance: 61.2 mL/min (A) (by C-G formula based on SCr of 1.12 mg/dL (H)).  Assessment: 58 yo F who is admitted s/p falls at home with LOC; diplopia also noted. PMH includes SLE, RA, HTN, HLD, CVA / TIA, hx PE on warfarin PTA, anxiety / depression, CKD3a, PTSD, migraines, and vision impairment. CT head negative for bleed. Pharmacy consulted to begin IV heparin on 01/06/24 while warfarin was on hold for LP, performed on 01/08/24. Warfarin resumed 3/8. Heparin stopped on 3/11 when INR >2.   INR trended up to therapeutic (2.2) on 3/11 and remains therapeutic (2.5). CBC stable. Received boosted dose x 4 days, and home regimen resumed 3/11.  PTA warfarin: 4 mg daily  Goal of Therapy:  INR 2-3 Monitor platelets by anticoagulation protocol: Yes   Plan:  Continue Warfarin 4 mg daily. Daily PT/INR for now. Decrease frequency if INR remains at goal.  Dennie Fetters, RPh 01/13/2024,10:35 AM

## 2024-01-13 NOTE — Consult Note (Signed)
 [facility] Liaison Consult Note   01/13/2024  Lauren Chung 1966-05-12 [id number]  Insurance: [employer] Dual Complete  Primary Care Provider: Tally Joe, MD is an [place] Physician,  this provider is listed for the transition of care follow up appointments  and [facility] Transition team calls   [facility] Liaison met patient at bedside at [facility].  Patient resting up in bed.  Explained reason for visit.  Patient endorses PCP and post hospital follow up appointments.    The patient was screened for less than  30 day readmission hospitalization with noted medium risk score for unplanned readmission risk 3 hospital admissions in 6 months.  The patient was assessed for potential [facility] Coordination service needs for post hospital transition for care coordination. Spoke with patient about noted medication management needs.  Patient states she is concerned of how she sometimes go about her day with the intention of taking her meds.  She states she is "now on new meds that concerns [her] even more.   Plan: [facility] Liaison will continue to follow progress and disposition to asess for post hospital community care coordination/management needs.  Referral request for community care coordination: Will reach out to [facility] team and request for possible care coordination follow up.   [facility]BCI Community Care, Population Health does not replace or interfere with any arrangements made by the Inpatient Transition of Care team.   For questions contact:   Charlesetta Shanks, RN, BSN, CCM [place]  [facility] Liaison Direct Dial: [phone] or secure chat Email: [email or web address]

## 2024-01-13 NOTE — TOC Transition Note (Addendum)
 Transition of Care [facility]) - Discharge Note   Patient Details  Name: Lauren Chung MRN: [id number] Date of Birth: 04/28/1966  Transition of Care [facility]) CM/SW Contact:  Tom-Johnson, Hershal Coria, RN Phone Number: 01/13/2024, 1:20 PM   Clinical Narrative:     Patient is scheduled for discharge today.  Readmission Risk Assessment done. Hospital f/u and discharge instructions on AVS. Prescriptions sent to [facility] pharmacy and patient will receive meds prior discharge. Manual wheelchair with cushion ordered from [employer] and [employer] to deliver to patient prior discharge.  Daughter, [name] to transport at discharge.  No further TOC needs noted.        Final next level of care: Home/Self Care Barriers to Discharge: Barriers Resolved   Patient Goals and CMS Choice Patient states their goals for this hospitalization and ongoing recovery are:: To return home CMS Medicare.gov Compare Post Acute Care list provided to:: Patient Choice offered to / list presented to : Patient [place] ownership interest in [facility].provided to:: Patient    Discharge Placement                Patient to be transferred to facility by: Daughter Name of family member notified: [name]    Discharge Plan and Services Additional resources added to the After Visit Summary for       Post Acute Care Choice: Skilled Nursing Facility          DME Arranged: Wheelchair manual   Date DME Agency Contacted: 01/13/24 Time DME Agency Contacted: 1308 Representative spoke with at DME Agency: Vaughan Basta HH Arranged: NA (No PT/OT f/u at discharge.)          Social Drivers of Health (SDOH) Interventions SDOH Screenings   Food Insecurity: Food Insecurity Present (01/06/2024)  Housing: Low Risk  (01/06/2024)  Transportation Needs: No Transportation Needs (01/06/2024)  Utilities: At Risk (01/06/2024)  Social Connections: Socially Isolated (01/06/2024)  Tobacco Use: Low Risk  (01/06/2024)     Readmission  Risk Interventions    01/13/2024    1:08 PM  Readmission Risk Prevention Plan  Transportation Screening Complete  PCP or Specialist Appt within 5-7 Days Complete  Home Care Screening Complete  Medication Review (RN CM) Referral to Pharmacy

## 2024-01-13 NOTE — Progress Notes (Signed)
 Mobility Specialist Progress Note:    01/13/24 0900  Mobility  Activity Ambulated independently in hallway  Level of Assistance Standby assist, set-up cues, supervision of patient - no hands on  Assistive Device None  Distance Ambulated (ft) 180 ft  Activity Response Tolerated well  Mobility Referral Yes  Mobility visit 1 Mobility  Mobility Specialist Start Time (ACUTE ONLY) [id number]  Mobility Specialist Stop Time (ACUTE ONLY) 0829  Mobility Specialist Time Calculation (min) (ACUTE ONLY) 5 min   Received pt on EOB having no complaints and agreeable to mobility. Pt was asymptomatic throughout ambulation and returned to room w/o fault. Left on EOB w/ call bell in reach and all needs met.   [place] Earlene Plater Mobility Specialist Please contact via Special educational needs teacher or Rehab office at [phone]

## 2024-01-13 NOTE — Progress Notes (Signed)
 Physical Therapy Treatment  Patient Details Name: Lauren Chung MRN: [id number] DOB: [birth year] Today's Date: 01/13/2024   History of Present Illness Pt is 58 yo female presented on 3/4 after 2 falls at home with head trauma, hit the back of her head against the carpeted floor.  [name] while trying to get from the bed to the bathroom.  Reportedly soft blood pressure with EMS.  Admit on 12/22/23 with dizziness and fatigue with ADLs after recent admission 2/9-2/14/25 following syncopal episode. Pt noted epsiodes of significant dizziness and activity intolerance after returning home.PMH:  lupus, seronegative rheumatoid arthritis, HTN, HLD, anxiety and depression, CKD stage IIIa, PTSD, history of CVA, seizure disorder.    PT Comments  Focus of session was education and mobility training with rollator and wheelchair. Pt reports feeling safer and more comfortable using the wheelchair. When sitting on the rollator pt reports feeling as if she would fall off of it if she was having a "dizzy spell" at home. Pt was able to demonstrate ~200' distance in w/c with modified independence. Verbally reviewed education with stair negotiation. Will continue to follow and progress as able per POC.    If plan is discharge home, recommend the following: A little help with walking and/or transfers;A little help with bathing/dressing/bathroom;Assistance with cooking/housework;Help with stairs or ramp for entrance;Assist for transportation   Can travel by private vehicle     Yes  Equipment Recommendations  Wheelchair (measurements PT);Wheelchair cushion (measurements PT)    Recommendations for Other Services       Precautions / Restrictions Precautions Precautions: Fall Recall of Precautions/Restrictions: Intact Precaution/Restrictions Comments: syncope. dizziness Restrictions Weight Bearing Restrictions Per Provider Order: No     Mobility  Bed Mobility Overal bed mobility: Modified Independent Bed Mobility:  Supine to Sit           General bed mobility comments: No assist required for transition to EOB. Pt sitting independently at EOB throughout session.    Transfers Overall transfer level: Modified independent Equipment used: None Transfers: Sit to/from Stand Sit to Stand: Supervision           General transfer comment: no LOB, supervision for safety but standing/ambulating independently with/without RW    Ambulation/Gait Ambulation/Gait assistance: Supervision Gait Distance (Feet): 150 Feet Assistive device: Rollator (4 wheels) Gait Pattern/deviations: Decreased stride length, Step-through pattern Gait velocity: Decreased Gait velocity interpretation: 1.31 - 2.62 ft/sec, indicative of limited community ambulator   General Gait Details: Slow but generally steady with rollator for support. Looking up and down very rhythmic throughout ambulation and complaining of difficulty walking because she is looking down. Pt cued for upright posture and gaze stabilization.   Stairs             [employer]chant [employer] officer mobility: Yes Wheelchair propulsion: Both upper extremities Wheelchair parts: Independent Distance: 200 Wheelchair Assistance Details (indicate cue type and reason): Cues initially for steering, however by end of w/c training pt at a mod I level.   Tilt Bed    Modified Rankin (Stroke Patients Only) Modified Rankin (Stroke Patients Only) Pre-Morbid Rankin Score: Moderate disability Modified Rankin: Moderate disability     Balance Overall balance assessment: Modified Independent Sitting-balance support: Feet supported, Single extremity supported, Bilateral upper extremity supported Sitting balance-Leahy Scale: Good Sitting balance - Comments: no sway; able to reach down to feet to don socks Postural control: Other (comment) Standing balance support: No upper extremity supported Standing balance-Leahy Scale: Fair  Communication Communication Communication: No apparent difficulties  Cognition Arousal: Alert Behavior During Therapy: WFL for tasks assessed/performed   PT - Cognitive impairments: No apparent impairments                         Following commands: Intact      Cueing Cueing Techniques: Verbal cues, Gestural cues  Exercises      General Comments        Pertinent Vitals/Pain Pain Assessment Pain Assessment: No/denies pain Pain Intervention(s): Monitored during session    Home Living                          Prior Function            PT Goals (current goals can now be found in the care plan section) Acute Rehab PT Goals Patient Stated Goal: improve activity tolerance PT Goal Formulation: With patient Time For Goal Achievement: 01/25/24 Potential to Achieve Goals: Good Progress towards PT goals: Progressing toward goals    Frequency    Min 2X/week      PT Plan      Co-evaluation              AM-PAC PT "6 Clicks" Mobility   Outcome Measure  Help needed turning from your back to your side while in a flat bed without using bedrails?: None Help needed moving from lying on your back to sitting on the side of a flat bed without using bedrails?: None Help needed moving to and from a bed to a chair (including a wheelchair)?: None Help needed standing up from a chair using your arms (e.g., wheelchair or bedside chair)?: None Help needed to walk in hospital room?: A Little Help needed climbing 3-5 steps with a railing? : A Little 6 Click Score: 22    End of Session Equipment Utilized During Treatment: Gait belt Activity Tolerance: Patient tolerated treatment well Patient left: with call bell/phone within reach;in bed Nurse Communication: Mobility status PT Visit Diagnosis: Unsteadiness on feet (R26.81);Difficulty in walking, not elsewhere classified (R26.2);Dizziness and giddiness (R42)     Time:  4010-2725 PT Time Calculation (min) (ACUTE ONLY): 25 min  Charges:    $Gait Training: 23-37 mins PT General Charges $$ ACUTE PT VISIT: 1 Visit                     Conni Slipper, PT, DPT Acute Rehabilitation Services Secure Chat Preferred Office: [phone]    Marylynn Pearson 01/13/2024, 11:24 AM

## 2024-01-13 NOTE — Discharge Summary (Signed)
 DISCHARGE SUMMARY  Lauren Chung  MR#: [id number]  DOB:April 30, 1966  Date of Admission: 01/05/2024 Date of Discharge: 01/13/2024  Attending Physician:Natanael Saladin Silvestre Gunner, MD  Patient's WUJ:[id number], Onalee Hua, MD  Disposition: D/C home   Follow-up Appts:  Contact information for follow-up providers     Tally Joe, MD Follow up in 10 day(s).   Specialty: Family Medicine Contact information: [phone] [address] [place] [address] [phone]              Contact information for after-discharge care     Destination     [facility] Preferred SNF .   Service: Skilled Nursing Contact information: [address] 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 Her history was most consistent with a syncopal spell. At presentation she was also found to be suffering with acute kidney injury. It was noted at presentation that she had been prescribed Florinef by her Neurologist on 12/31/2023, but had never picked up the prescription.   Hospital Course:  Recurrent falls - syncope - chronic vertigo - chronic adrenal insufficiency - hypotension ACTH  stim test unable to be completed in the hospital as the patient was dosed with stress dose steroids at the time of her presentation - 2 different spot cortisol levels have been low during the last 3 weeks - slowly weaning stress dose steroids - continue as needed meclizine - Neurology evaluated patient and felt symptoms were most related to adrenal insufficiency with no evidence of seizure - LP completed and was unremarkable  - will need to consider outpatient Endocrine follow-up -discharged home on slow weaning dose of prednisone which will plateau at 10 mg a day until further workup as outpatient can be completed   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 Mild left leg weakness stable during this admission   History of pulmonary embolism on chronic anticoagulation Continue warfarin -INR at goal   RA + SLE On rituximab in outpatient setting -continue Plaquenil and steroid dosing   Anxiety disorder Continue Xanax as needed -patient informed that ongoing prescriptions of this medication will have to come from her primary care physician   CKD stage IIIb Creatinine stable during this admission   Hypokalemia Supplemented -recheck in outpatient setting indicated  Allergies as of 01/13/2024       Reactions   Acetaminophen Nausea And Vomiting   Belsomra [suvorexant] Shortness Of Breath, Rash   Codeine Shortness Of Breath   Iodinated Contrast Media Shortness Of Breath        Medication List     STOP taking these medications    fludrocortisone 0.1 MG tablet Commonly known as: FLORINEF   metoprolol succinate 25 MG 24 hr tablet Commonly known as:  TOPROL-XL       TAKE these medications    ALPRAZolam 0.5 MG tablet Commonly known as: XANAX Take 1 tablet (0.5 mg total) by mouth 2 (two) times daily as needed for anxiety. What changed:  medication strength how much to take when to take this reasons to take this   hydroxychloroquine 200 MG tablet Commonly  known as: PLAQUENIL Take 200 mg by mouth daily.   meclizine 25 MG tablet Commonly known as: ANTIVERT Take 1 tablet (25 mg total) by mouth 3 (three) times daily as needed for dizziness.   predniSONE 10 MG tablet Commonly known as: DELTASONE 4 tablets a day for 3 days, then 3 tablets a day for 3 days, then 2 tablets a day for 3 days, then 1 tablet a day thereafter until you see your primary care doctor Start taking on: January 14, 2024 What changed:  medication strength how much to take how to take this when to take this additional instructions   rosuvastatin 10 MG tablet Commonly known as: CRESTOR Take 10 mg by mouth in the morning.   warfarin 4 MG tablet Commonly known as: COUMADIN Take 4 mg by mouth daily.   zolpidem 10 MG tablet Commonly known as: AMBIEN Take 1 tablet (10 mg total) by mouth at bedtime as needed for sleep.               Durable Medical Equipment  (From admission, onward)           Start     Ordered   01/11/24 1357  For home use only DME lightweight manual wheelchair with seat cushion  Once       Comments: Patient suffers from weakness which impairs their ability to perform daily activities like feeding in the home.  A crutch will not resolve  issue with performing activities of daily living. A wheelchair will allow patient to safely perform daily activities. Patient is not able to propel themselves in the home using a standard weight wheelchair due to general weakness. Patient can self propel in the lightweight wheelchair. Length of need Lifetime. Accessories: elevating leg rests (ELRs), wheel locks, extensions and anti-tippers.   01/11/24 1356            Day of Discharge BP 126/76 (BP Location: Right Arm)   Pulse 74   Temp 98.3 F (36.8 C) (Oral)   Resp 19   Ht 5\' 8"  (1.727 m)   Wt 77 kg   LMP  (LMP Unknown)   SpO2 98%   BMI 25.81 kg/m   Physical Exam: General: No acute respiratory distress Lungs: Clear to auscultation  bilaterally without wheezes or crackles Cardiovascular: Regular rate and rhythm without murmur gallop or rub normal S1 and S2 Abdomen: Nontender, nondistended, soft, bowel sounds positive, no rebound, no ascites, no appreciable mass Extremities: No significant cyanosis, clubbing, or edema bilateral lower extremities  Basic Metabolic Panel: Recent Labs  Lab 01/07/24 0833 01/09/24 0341 01/10/24 0546 01/11/24 0325 01/13/24 0341  NA 139 139 138 141 140  K 4.1 3.3* 3.4* 3.4* 3.1*  CL 109 107 107 111 109  CO2 22 25 24 23 25   GLUCOSE 77 90 98 110* 86  BUN 13 12 16 18 19   CREATININE 1.18* 1.14* 1.07* 1.11* 1.12*  CALCIUM 8.2* 8.3* 8.8* 8.7* 8.8*    CBC: Recent Labs  Lab 01/09/24 0341 01/10/24 0546 01/11/24 0325 01/12/24 0347 01/13/24 0341  WBC 7.4 11.7* 15.3* 13.8* 12.9*  HGB 12.5 12.5 13.1 12.0 12.2  HCT 37.6 37.2 40.1 35.4* 36.1  MCV 89.7 89.9 90.5 88.3 88.0  PLT 213 222 231 239 248    Time spent in discharge (includes decision making & examination of pt): 35 minutes  01/13/2024, 12:26 PM   Lonia Blood, MD [facility] Office  [phone]

## 2024-01-14 ENCOUNTER — Other Ambulatory Visit: Payer: 59 | Admitting: *Deleted

## 2024-01-14 ENCOUNTER — Encounter (HOSPITAL_COMMUNITY): Payer: Self-pay

## 2024-01-18 ENCOUNTER — Telehealth: Payer: Self-pay

## 2024-01-18 DIAGNOSIS — Z7901 Long term (current) use of anticoagulants: Secondary | ICD-10-CM | POA: Diagnosis not present

## 2024-01-18 DIAGNOSIS — Z86711 Personal history of pulmonary embolism: Secondary | ICD-10-CM

## 2024-01-18 DIAGNOSIS — D849 Immunodeficiency, unspecified: Secondary | ICD-10-CM | POA: Diagnosis not present

## 2024-01-18 DIAGNOSIS — M329 Systemic lupus erythematosus, unspecified: Secondary | ICD-10-CM | POA: Diagnosis not present

## 2024-01-18 DIAGNOSIS — E876 Hypokalemia: Secondary | ICD-10-CM | POA: Diagnosis not present

## 2024-01-18 DIAGNOSIS — N179 Acute kidney failure, unspecified: Secondary | ICD-10-CM | POA: Diagnosis not present

## 2024-01-18 DIAGNOSIS — I129 Hypertensive chronic kidney disease with stage 1 through stage 4 chronic kidney disease, or unspecified chronic kidney disease: Secondary | ICD-10-CM | POA: Diagnosis not present

## 2024-01-18 DIAGNOSIS — Z8673 Personal history of transient ischemic attack (TIA), and cerebral infarction without residual deficits: Secondary | ICD-10-CM | POA: Diagnosis not present

## 2024-01-18 DIAGNOSIS — Z9181 History of falling: Secondary | ICD-10-CM | POA: Diagnosis not present

## 2024-01-18 DIAGNOSIS — E785 Hyperlipidemia, unspecified: Secondary | ICD-10-CM | POA: Diagnosis not present

## 2024-01-18 DIAGNOSIS — N1831 Chronic kidney disease, stage 3a: Secondary | ICD-10-CM | POA: Diagnosis not present

## 2024-01-18 DIAGNOSIS — M0600 Rheumatoid arthritis without rheumatoid factor, unspecified site: Secondary | ICD-10-CM | POA: Diagnosis not present

## 2024-01-18 DIAGNOSIS — M0680 Other specified rheumatoid arthritis, unspecified site: Secondary | ICD-10-CM | POA: Diagnosis not present

## 2024-01-18 DIAGNOSIS — Z7952 Long term (current) use of systemic steroids: Secondary | ICD-10-CM | POA: Diagnosis not present

## 2024-01-18 NOTE — Patient Outreach (Signed)
 Submitted faxed referral.  Vanice Sarah [facility] Care Management Assistant  Direct Dial: [phone]  Fax: [phone] Website: [email or web address]

## 2024-01-19 ENCOUNTER — Telehealth: Payer: Self-pay | Admitting: *Deleted

## 2024-01-19 NOTE — Progress Notes (Signed)
 Complex Care Management Note Care Guide Note  01/19/2024 Name: Lauren Chung MRN: [id number] DOB: 03-30-1966   Complex Care Management Outreach Attempts: An unsuccessful telephone outreach was attempted today to offer the patient information about available complex care management services.  Follow Up Plan:  Additional outreach attempts will be made to offer the patient complex care management information and services.   Encounter Outcome:  No Answer  Gwenevere Ghazi  [facility]  [facility] Guide  Direct Dial: [phone]  Fax [phone]

## 2024-01-21 DIAGNOSIS — E2740 Unspecified adrenocortical insufficiency: Secondary | ICD-10-CM | POA: Diagnosis not present

## 2024-01-21 DIAGNOSIS — R5381 Other malaise: Secondary | ICD-10-CM | POA: Diagnosis not present

## 2024-01-21 DIAGNOSIS — I951 Orthostatic hypotension: Secondary | ICD-10-CM | POA: Diagnosis not present

## 2024-01-21 LAB — OLIGOCLONAL BANDS, CSF + SERM

## 2024-01-21 NOTE — Progress Notes (Signed)
 Complex Care Management Note Care Guide Note  01/21/2024 Name: Lauren Chung MRN: [id number] DOB: [birth year]   Complex Care Management Outreach Attempts: A second unsuccessful outreach was attempted today to offer the patient with information about available complex care management services.  Follow Up Plan:  Additional outreach attempts will be made to offer the patient complex care management information and services.   Encounter Outcome:  No Answer  Gwenevere Ghazi  [name]  [facility], [facility] Guide  Direct Dial: [phone]  Fax [phone]

## 2024-01-22 DIAGNOSIS — N179 Acute kidney failure, unspecified: Secondary | ICD-10-CM | POA: Diagnosis not present

## 2024-01-22 DIAGNOSIS — M0600 Rheumatoid arthritis without rheumatoid factor, unspecified site: Secondary | ICD-10-CM | POA: Diagnosis not present

## 2024-01-22 DIAGNOSIS — M329 Systemic lupus erythematosus, unspecified: Secondary | ICD-10-CM | POA: Diagnosis not present

## 2024-01-22 DIAGNOSIS — Z7901 Long term (current) use of anticoagulants: Secondary | ICD-10-CM | POA: Diagnosis not present

## 2024-01-22 DIAGNOSIS — I129 Hypertensive chronic kidney disease with stage 1 through stage 4 chronic kidney disease, or unspecified chronic kidney disease: Secondary | ICD-10-CM | POA: Diagnosis not present

## 2024-01-22 DIAGNOSIS — Z9181 History of falling: Secondary | ICD-10-CM | POA: Diagnosis not present

## 2024-01-22 DIAGNOSIS — Z7952 Long term (current) use of systemic steroids: Secondary | ICD-10-CM | POA: Diagnosis not present

## 2024-01-22 DIAGNOSIS — Z8673 Personal history of transient ischemic attack (TIA), and cerebral infarction without residual deficits: Secondary | ICD-10-CM | POA: Diagnosis not present

## 2024-01-22 DIAGNOSIS — E785 Hyperlipidemia, unspecified: Secondary | ICD-10-CM | POA: Diagnosis not present

## 2024-01-22 DIAGNOSIS — N1831 Chronic kidney disease, stage 3a: Secondary | ICD-10-CM | POA: Diagnosis not present

## 2024-01-22 DIAGNOSIS — D849 Immunodeficiency, unspecified: Secondary | ICD-10-CM | POA: Diagnosis not present

## 2024-01-22 DIAGNOSIS — Z86711 Personal history of pulmonary embolism: Secondary | ICD-10-CM | POA: Diagnosis not present

## 2024-01-22 DIAGNOSIS — E876 Hypokalemia: Secondary | ICD-10-CM | POA: Diagnosis not present

## 2024-01-22 NOTE — Progress Notes (Signed)
 Complex Care Management Note Care Guide Note  01/22/2024 Name: Lauren Chung MRN: [id number] DOB: 12-31-1965   Complex Care Management Outreach Attempts: A third unsuccessful outreach was attempted today to offer the patient with information about available complex care management services.  Follow Up Plan:  Additional outreach attempts will be made to offer the patient complex care management information and services.   Encounter Outcome:  Patient Request to Call Back  Gwenevere Ghazi  [facility]  [facility], [facility] Guide  Direct Dial: [phone]  Fax [phone]

## 2024-01-25 NOTE — Progress Notes (Signed)
 Complex Care Management Note Care Guide Note  01/25/2024 Name: Lauren Chung MRN: [id number] DOB: [birth year]   Complex Care Management Outreach Attempts: A third unsuccessful outreach was attempted today to offer the patient with information about available complex care management services.  Follow Up Plan:  No further outreach attempts will be made at this time. We have been unable to contact the patient to offer or enroll patient in complex care management services.  Encounter Outcome:  No Answer  Gwenevere Ghazi  [facility]  [facility], [facility] Guide  Direct Dial: [phone]  Fax [phone]

## 2024-01-27 ENCOUNTER — Ambulatory Visit (HOSPITAL_COMMUNITY): Admitting: Psychiatry

## 2024-01-27 ENCOUNTER — Ambulatory Visit (INDEPENDENT_AMBULATORY_CARE_PROVIDER_SITE_OTHER): Admitting: Neurology

## 2024-01-27 ENCOUNTER — Encounter: Payer: Self-pay | Admitting: Neurology

## 2024-01-27 DIAGNOSIS — R2990 Unspecified symptoms and signs involving the nervous system: Secondary | ICD-10-CM

## 2024-01-27 DIAGNOSIS — R400 Somnolence: Secondary | ICD-10-CM

## 2024-01-27 DIAGNOSIS — M3219 Other organ or system involvement in systemic lupus erythematosus: Secondary | ICD-10-CM

## 2024-01-27 DIAGNOSIS — M05741 Rheumatoid arthritis with rheumatoid factor of right hand without organ or systems involvement: Secondary | ICD-10-CM

## 2024-01-27 DIAGNOSIS — R4182 Altered mental status, unspecified: Secondary | ICD-10-CM

## 2024-01-27 NOTE — Procedures (Addendum)
    History:  58 year old man with AMS, EEG to rule out seizure   EEG classification: Awake and drowsy  Duration: 25 minutes   Technical aspects: This EEG study was done with scalp electrodes positioned according to the 10-20 International system of electrode placement. Electrical activity was reviewed with band pass filter of 1-70Hz , sensitivity of 7 uV/mm, display speed of 60mm/sec with a 60Hz  notched filter applied as appropriate. EEG data were recorded continuously and digitally stored.   Description of the recording: The background rhythms of this recording consists of a fairly well modulated medium amplitude alpha rhythm of 10 Hz that is reactive to eye opening and closure. Present in the anterior head region is a 15-20 Hz beta activity. Photic stimulation was performed, did not show any abnormalities. Hyperventilation was also performed, did not show any abnormalities. Drowsiness was manifested by background fragmentation. No abnormal epileptiform discharges seen during this recording. There was no focal slowing. There were no electrographic seizure identified.   Abnormality: None   Impression: This is a normal awake and drowsy EEG. No evidence of interictal epileptiform discharges. Normal EEGs, however, do not rule out epilepsy.    Lauren Norfolk, MD [facility]

## 2024-01-29 LAB — CULTURE, FUNGUS WITHOUT SMEAR

## 2024-02-08 DIAGNOSIS — T380X5A Adverse effect of glucocorticoids and synthetic analogues, initial encounter: Secondary | ICD-10-CM | POA: Diagnosis not present

## 2024-02-08 DIAGNOSIS — M0609 Rheumatoid arthritis without rheumatoid factor, multiple sites: Secondary | ICD-10-CM | POA: Diagnosis not present

## 2024-02-08 DIAGNOSIS — E2740 Unspecified adrenocortical insufficiency: Secondary | ICD-10-CM | POA: Diagnosis not present

## 2024-02-08 DIAGNOSIS — M810 Age-related osteoporosis without current pathological fracture: Secondary | ICD-10-CM | POA: Diagnosis not present

## 2024-02-08 DIAGNOSIS — M329 Systemic lupus erythematosus, unspecified: Secondary | ICD-10-CM | POA: Diagnosis not present

## 2024-02-16 ENCOUNTER — Ambulatory Visit (HOSPITAL_BASED_OUTPATIENT_CLINIC_OR_DEPARTMENT_OTHER): Admitting: Psychiatry

## 2024-02-16 ENCOUNTER — Other Ambulatory Visit: Payer: Self-pay

## 2024-02-16 ENCOUNTER — Other Ambulatory Visit (HOSPITAL_COMMUNITY): Payer: Self-pay

## 2024-02-16 ENCOUNTER — Encounter (HOSPITAL_COMMUNITY): Payer: Self-pay | Admitting: Psychiatry

## 2024-02-16 VITALS — BP 127/88 | HR 96 | Ht 68.0 in | Wt 177.0 lb

## 2024-02-16 DIAGNOSIS — F329 Major depressive disorder, single episode, unspecified: Secondary | ICD-10-CM

## 2024-02-16 MED ORDER — ZOLPIDEM TARTRATE 10 MG PO TABS: 10.0000 mg | ORAL_TABLET | Freq: Every evening | ORAL | 3 refills | Status: DC | PRN

## 2024-02-16 MED ORDER — ALPRAZOLAM 0.5 MG PO TABS: 0.5000 mg | ORAL_TABLET | Freq: Two times a day (BID) | ORAL | 3 refills | Status: DC

## 2024-02-16 MED ORDER — DESIPRAMINE HCL 25 MG PO TABS: ORAL_TABLET | ORAL | 4 refills | Status: DC

## 2024-02-16 NOTE — Progress Notes (Signed)
 Past Psychiatric Initial Adult Assessment   Patient Identification: Lauren Chung MRN:  [id number] Date of Evaluation:  12/29/2023 Referral Source Fern Hover Chief Complaint:    Visit Diagnosis:      Today the patient is doing poorly.  After she saw me she was hospitalized again.  She was so impaired that she had to be in a wheelchair.  Eventually she started to walk again.  Unfortunately her small dog died relatively unexpectedly.  She still has 2 other dogs.  Her daughter has moved in with her which is a good thing.  The patient still takes care of her.  Her son [name].  The patient describes daily depression but also a lot of anger and irritability.  Her sleep is mildly impaired.  Her energy level is very low.  She feels exhausted.  She is not suicidal.  She is gaining weight but is probably because she is on prednisone for her new diagnosis of Addison's disease.  The patient is very withdrawn.  She is showing significant anhedonia.  She does not drink alcohol or use drugs and she has no evidence of psychosis.  In the hospital they appropriately took her off Wellbutrin as they suspected that she might have had a seizure.  Presently the only thing she takes is Ambien and some Xanax. Virtual Visit via Telephone Note  I connected with Itha Nolden on 12/29/23 at  4:30 PM EST by telephone and verified that I am speaking with the correct person using two identifiers.  Location: Patient: home Provider: office   I discussed the limitations, risks, security and privacy concerns of performing an evaluation and management service by telephone and the availability of in person appointments. I also discussed with the patient that there may be a patient responsible charge related to this service. The patient expressed understanding and agreed to proceed.      I discussed the assessment and treatment plan with the patient. The patient was provided an opportunity to ask questions and all were  answered. The patient agreed with the plan and demonstrated an understanding of the instructions.   The patient was advised to call back or seek an in-person evaluation if the symptoms worsen or if the condition fails to improve as anticipated.  I provided 25 minutes of non-face-to-face time during this encounter.   Delorse Fey, MD    Anxiety Symptoms:   Psychotic Symptoms:   PTSD Symptoms:   Past Psychiatric History: Past therapy, one psychiatric hospitalization  Previous Psychotropic Medications: Yes   Substance Abuse History in the last 12 months:  No.  Consequences of Substance Abuse: Negative  Past Medical History:  Past Medical History:  Diagnosis Date   Anemia    Anxiety    Arthritis    CKD (chronic kidney disease) stage 3, GFR 30-59 ml/min (HCC)    Depression    History of blood transfusion    in [place]   Hyperlipidemia    Hypertension    Lupus    PE (pulmonary embolism)    Pneumonia    x 3 - last time 2017   PTSD (post-traumatic stress disorder)    Renal disorder    Rheumatoid arthritis (HCC)    Seizures (HCC)    Stroke (HCC)    many years ago per patient, no deficits   SVD (spontaneous vaginal delivery)    x 3   TIA (transient ischemic attack)    many years ago per patient, no deficits    Past Surgical  History:  Procedure Laterality Date   ABDOMINAL SURGERY     due internal bleeding   ABLATION     gyn procedure for bleeding   CYST EXCISION     top of head   EXCISION MASS HEAD N/A 08/05/2019   Procedure: EXCISION OF SCALP CYST;  Surgeon: Serena Colonel, MD;  Location: [facility];  Service: ENT;  Laterality: N/A;    Family Psychiatric History:   Family History:  Family History  Problem Relation Age of Onset   Drug abuse Mother    Alcohol abuse Mother    Leukemia Mother    Leukemia Father    Seizures Daughter     Social History:   Social History   Socioeconomic History   Marital status: Single    Spouse name: Not on  file   Number of children: 3   Years of education: Not on file   Highest education level: Not on file  Occupational History   Not on file  Tobacco Use   Smoking status: Never   Smokeless tobacco: Never  Vaping Use   Vaping status: Never Used  Substance and Sexual Activity   Alcohol use: Never   Drug use: Never   Sexual activity: Not Currently    Partners: Male    Birth control/protection: Condom, Post-menopausal  Other Topics Concern   Not on file  Social History Narrative   R handed   Live with son and 3 dogs   Two story   No Caffeine   Social Drivers of Corporate investment banker Strain: Not on file  Food Insecurity: Food Insecurity Present (12/22/2023)   Hunger Vital Sign    Worried About Running Out of Food in the Last Year: Sometimes true    Ran Out of Food in the Last Year: Sometimes true  Transportation Needs: No Transportation Needs (12/22/2023)   PRAPARE - Administrator, Civil Service (Medical): No    Lack of Transportation (Non-Medical): No  Physical Activity: Not on file  Stress: Not on file  Social Connections: Socially Isolated (12/22/2023)   Social Connection and Isolation Panel [NHANES]    Frequency of Communication with Friends and Family: Once a week    Frequency of Social Gatherings with Friends and Family: Once a week    Attends Religious Services: Never    Database administrator or Organizations: No    Attends Banker Meetings: Never    Marital Status: Divorced    Additional Social History:   Allergies:   Allergies  Allergen Reactions   Acetaminophen Nausea And Vomiting   Belsomra [Suvorexant] Shortness Of Breath and Rash   Codeine Shortness Of Breath   Iodinated Contrast Media Shortness Of Breath    Metabolic Disorder Labs: Lab Results  Component Value Date   HGBA1C 4.8 12/22/2023   MPG 91.06 12/22/2023   MPG 96.8 03/05/2022   No results found for: "PROLACTIN" Lab Results  Component Value Date   CHOL 127  12/22/2023   TRIG 90 12/22/2023   HDL 41 12/22/2023   CHOLHDL 3.1 12/22/2023   VLDL 18 12/22/2023   LDLCALC 68 12/22/2023   LDLCALC 52 03/06/2022     Current Medications: Current Outpatient Medications  Medication Sig Dispense Refill   alendronate (FOSAMAX) 70 MG tablet Take 70 mg by mouth once a week.     ALPRAZolam (XANAX) 1 MG tablet Take 1 tablet (1 mg total) by mouth 2 (two) times daily. 90 tablet 3  docusate sodium (COLACE) 100 MG capsule Take 1 capsule (100 mg total) by mouth 2 (two) times daily. (Patient not taking: Reported on 12/22/2023) 10 capsule 0   hydroxychloroquine (PLAQUENIL) 200 MG tablet Take 200 mg by mouth daily.     meclizine (ANTIVERT) 25 MG tablet Take 1 tablet (25 mg total) by mouth 3 (three) times daily as needed for dizziness. 30 tablet 0   predniSONE (DELTASONE) 10 MG tablet Prednisone 40 mg po daily x 2 day then Prednisone 30 mg po daily x 2 day then Prednisone 20 mg po daily x 2 day then Prednisone 10 mg daily x 2 day then start taking prednisone 5 mg daily till you  see your rheumatologist (Patient taking differently: Take 5-10 mg by mouth See admin instructions. Prednisone 40 mg po daily x 2 day then Prednisone 30 mg po daily x 2 day then Prednisone 20 mg po daily x 2 day then Prednisone 10 mg daily x 2 day then start taking prednisone 5 mg daily till you  see your rheumatologist) 25 tablet 0   predniSONE (DELTASONE) 5 MG tablet Take 1 tablet (5 mg total) by mouth daily with breakfast. (Patient not taking: Reported on 12/22/2023) 30 tablet 3   rosuvastatin (CRESTOR) 10 MG tablet Take 10 mg by mouth in the morning.     warfarin (COUMADIN) 4 MG tablet Take 4 mg by mouth daily.     zolpidem (AMBIEN) 10 MG tablet Take 1 tablet (10 mg total) by mouth at bedtime as needed for sleep. 30 tablet 3   No current facility-administered medications for this visit.    Neurologic: Headache: No Seizure: No Paresthesias:No  Musculoskeletal: Strength & Muscle Tone: within  normal limits Gait & Station: normal Patient leans: N/A  Psychiatric Specialty Exam: ROS  Blood pressure 119/68, pulse 73, resp. rate 18, height 5\' 8"  (1.727 m), weight 156 lb 12.8 oz (71.1 kg).Body mass index is 23.84 kg/m.  General Appearance: Casual  Eye Contact:  Good  Speech:  Clear and Coherent  Volume:  Normal  Mood:  Dysphoric  Affect:  Appropriate  Thought Process:  Goal Directed  Orientation:  NA  Thought Content:  Logical  Suicidal Thoughts:  No  Homicidal Thoughts:  No  Memory:  NA  Judgement:  Good  Insight:  Good  Psychomotor Activity:  Normal  Concentration:    Recall:  Fair  Fund of Knowledge:Fair  Language: Good  Akathisia:  No  Handed:  Right  AIMS (if indicated):    Assets:  Desire for Improvement  ADL's:  Intact  Cognition: WNL  Sleep:      Treatment Plan Summary: 2/25/20254:52 PM     This patient's diagnosis is major depression recurrent.  Today we will begin her on desipramine 25 mg 1 in the morning for a week and then increasing it to 2.  The patient will continue taking Ambien for sleep and continue her Xanax prescription.  This patient to return to see me in 6 weeks.  She is functioning only fairly well.  She is not suicidal.  She has a very supportive daughter who is with her.  The patient is committed to her son [name].

## 2024-02-17 ENCOUNTER — Other Ambulatory Visit (HOSPITAL_COMMUNITY): Payer: Self-pay | Admitting: Psychiatry

## 2024-02-17 MED ORDER — LORAZEPAM 1 MG PO TABS: ORAL_TABLET | ORAL | 4 refills | Status: DC

## 2024-02-18 DIAGNOSIS — Z7901 Long term (current) use of anticoagulants: Secondary | ICD-10-CM | POA: Diagnosis not present

## 2024-02-26 ENCOUNTER — Other Ambulatory Visit (HOSPITAL_COMMUNITY): Payer: Self-pay | Admitting: Psychiatry

## 2024-02-26 DIAGNOSIS — E2740 Unspecified adrenocortical insufficiency: Secondary | ICD-10-CM | POA: Diagnosis not present

## 2024-02-29 DIAGNOSIS — E559 Vitamin D deficiency, unspecified: Secondary | ICD-10-CM | POA: Diagnosis not present

## 2024-03-01 DIAGNOSIS — M0609 Rheumatoid arthritis without rheumatoid factor, multiple sites: Secondary | ICD-10-CM | POA: Diagnosis not present

## 2024-03-03 DIAGNOSIS — E2740 Unspecified adrenocortical insufficiency: Secondary | ICD-10-CM | POA: Diagnosis not present

## 2024-03-03 DIAGNOSIS — T380X5A Adverse effect of glucocorticoids and synthetic analogues, initial encounter: Secondary | ICD-10-CM | POA: Diagnosis not present

## 2024-03-03 DIAGNOSIS — M810 Age-related osteoporosis without current pathological fracture: Secondary | ICD-10-CM | POA: Diagnosis not present

## 2024-03-03 DIAGNOSIS — M329 Systemic lupus erythematosus, unspecified: Secondary | ICD-10-CM | POA: Diagnosis not present

## 2024-03-10 ENCOUNTER — Other Ambulatory Visit (HOSPITAL_COMMUNITY): Payer: Self-pay | Admitting: Psychiatry

## 2024-03-11 ENCOUNTER — Other Ambulatory Visit (HOSPITAL_COMMUNITY): Payer: Self-pay | Admitting: *Deleted

## 2024-03-11 DIAGNOSIS — E2740 Unspecified adrenocortical insufficiency: Secondary | ICD-10-CM

## 2024-03-14 ENCOUNTER — Ambulatory Visit (HOSPITAL_COMMUNITY)
Admission: RE | Admit: 2024-03-14 | Discharge: 2024-03-14 | Disposition: A | Discharge status: Home / Self Care | Source: Ambulatory Visit | Attending: Endocrinology | Admitting: Endocrinology

## 2024-03-14 DIAGNOSIS — E2740 Unspecified adrenocortical insufficiency: Secondary | ICD-10-CM | POA: Insufficient documentation

## 2024-03-14 LAB — ACTH STIMULATION, 3 TIME POINTS
Cortisol, 30 Min: 19.6 ug/dL
Cortisol, 60 Min: 20.5 ug/dL
Cortisol, Base: 8.4 ug/dL

## 2024-03-14 MED ORDER — COSYNTROPIN 0.25 MG IJ SOLR
INTRAMUSCULAR | Status: AC
Start: 2024-03-14 — End: 2024-03-14
  Administered 2024-03-14: 0.25 mg via INTRAVENOUS
  Filled 2024-03-14: qty 0.25

## 2024-03-14 MED ORDER — COSYNTROPIN 0.25 MG IJ SOLR: 0.2500 mg | Freq: Once | INTRAMUSCULAR | Status: AC

## 2024-03-15 ENCOUNTER — Encounter (HOSPITAL_COMMUNITY): Payer: Self-pay

## 2024-03-15 DIAGNOSIS — Z86711 Personal history of pulmonary embolism: Secondary | ICD-10-CM | POA: Diagnosis not present

## 2024-03-15 DIAGNOSIS — Z7901 Long term (current) use of anticoagulants: Secondary | ICD-10-CM | POA: Diagnosis not present

## 2024-03-15 LAB — ACTH: C206 ACTH: <1.5 pg/mL — ABNORMAL LOW (ref 7.2–63.3)

## 2024-03-17 DIAGNOSIS — M0609 Rheumatoid arthritis without rheumatoid factor, multiple sites: Secondary | ICD-10-CM | POA: Diagnosis not present

## 2024-03-30 ENCOUNTER — Encounter (HOSPITAL_COMMUNITY): Payer: Self-pay | Admitting: Psychiatry

## 2024-03-30 ENCOUNTER — Ambulatory Visit (HOSPITAL_BASED_OUTPATIENT_CLINIC_OR_DEPARTMENT_OTHER): Admitting: Psychiatry

## 2024-03-30 ENCOUNTER — Other Ambulatory Visit: Payer: Self-pay

## 2024-03-30 VITALS — BP 140/81 | HR 120 | Ht 68.0 in | Wt 181.0 lb

## 2024-03-30 DIAGNOSIS — F324 Major depressive disorder, single episode, in partial remission: Secondary | ICD-10-CM

## 2024-03-30 MED ORDER — ALPRAZOLAM 1 MG PO TABS: ORAL_TABLET | ORAL | 4 refills | Status: DC

## 2024-03-30 NOTE — Progress Notes (Signed)
 Past Psychiatric Initial Adult Assessment   Patient Identification: Lauren Chung MRN:  [id number] Date of Evaluation:  12/29/2023 Referral Source Fern Hover Chief Complaint:    Visit Diagnosis:     Today the patient is seen in the office alone.  Today the patient says she is much better.  Her mood is improved taking 50 mg of desipramine .  However she had to seemingly panic attacks since I have seen her.  I believe there was a misunderstanding about her benzodiazepine.  She is supposed to be on Xanax  1 mg twice daily but she was given Ativan  at a relatively higher dose of 1 mg in the morning and 2 at night.  We will go ahead and correct that now.  She is sleeping well taking Ambien .  She notes that her mood is much improved and that she is no longer angry or irritable.  She is much calmer much more even.  Her daughter is very involved in her care who is living with her now.  [name] also looks after her.  Patient is diagnosed with Addison's disease.  She has a sleep problem and takes Ambien  which works well.  The patient is functioning better she feels much better. Virtual Visit via Telephone Note  I connected with Vergie Pellerito on 12/29/23 at  4:30 PM EST by telephone and verified that I am speaking with the correct person using two identifiers.  Location: Patient: home Provider: office   I discussed the limitations, risks, security and privacy concerns of performing an evaluation and management service by telephone and the availability of in person appointments. I also discussed with the patient that there may be a patient responsible charge related to this service. The patient expressed understanding and agreed to proceed.      I discussed the assessment and treatment plan with the patient. The patient was provided an opportunity to ask questions and all were answered. The patient agreed with the plan and demonstrated an understanding of the instructions.   The patient was advised  to call back or seek an in-person evaluation if the symptoms worsen or if the condition fails to improve as anticipated.  I provided 25 minutes of non-face-to-face time during this encounter.   Delorse Fey, MD    Anxiety Symptoms:   Psychotic Symptoms:   PTSD Symptoms:   Past Psychiatric History: Past therapy, one psychiatric hospitalization  Previous Psychotropic Medications: Yes   Substance Abuse History in the last 12 months:  No.  Consequences of Substance Abuse: Negative  Past Medical History:  Past Medical History:  Diagnosis Date   Anemia    Anxiety    Arthritis    CKD (chronic kidney disease) stage 3, GFR 30-59 ml/min (HCC)    Depression    History of blood transfusion    in [place]   Hyperlipidemia    Hypertension    Lupus    PE (pulmonary embolism)    Pneumonia    x 3 - last time 2017   PTSD (post-traumatic stress disorder)    Renal disorder    Rheumatoid arthritis (HCC)    Seizures (HCC)    Stroke (HCC)    many years ago per patient, no deficits   SVD (spontaneous vaginal delivery)    x 3   TIA (transient ischemic attack)    many years ago per patient, no deficits    Past Surgical History:  Procedure Laterality Date   ABDOMINAL SURGERY     due internal bleeding  ABLATION     gyn procedure for bleeding   CYST EXCISION     top of head   EXCISION MASS HEAD N/A 08/05/2019   Procedure: EXCISION OF SCALP CYST;  Surgeon: Janita Mellow, MD;  Location: [facility];  Service: ENT;  Laterality: N/A;    Family Psychiatric History:   Family History:  Family History  Problem Relation Age of Onset   Drug abuse Mother    Alcohol abuse Mother    Leukemia Mother    Leukemia Father    Seizures Daughter     Social History:   Social History   Socioeconomic History   Marital status: Single    Spouse name: Not on file   Number of children: 3   Years of education: Not on file   Highest education level: Not on file  Occupational  History   Not on file  Tobacco Use   Smoking status: Never   Smokeless tobacco: Never  Vaping Use   Vaping status: Never Used  Substance and Sexual Activity   Alcohol use: Never   Drug use: Never   Sexual activity: Not Currently    Partners: Male    Birth control/protection: Condom, Post-menopausal  Other Topics Concern   Not on file  Social History Narrative   R handed   Live with son and 3 dogs   Two story   No Caffeine   Social Drivers of Corporate investment banker Strain: Not on file  Food Insecurity: Food Insecurity Present (12/22/2023)   Hunger Vital Sign    Worried About Running Out of Food in the Last Year: Sometimes true    Ran Out of Food in the Last Year: Sometimes true  Transportation Needs: No Transportation Needs (12/22/2023)   PRAPARE - Administrator, Civil Service (Medical): No    Lack of Transportation (Non-Medical): No  Physical Activity: Not on file  Stress: Not on file  Social Connections: Socially Isolated (12/22/2023)   Social Connection and Isolation Panel [NHANES]    Frequency of Communication with Friends and Family: Once a week    Frequency of Social Gatherings with Friends and Family: Once a week    Attends Religious Services: Never    Database administrator or Organizations: No    Attends Banker Meetings: Never    Marital Status: Divorced    Additional Social History:   Allergies:   Allergies  Allergen Reactions   Acetaminophen  Nausea And Vomiting   Belsomra  [Suvorexant ] Shortness Of Breath and Rash   Codeine Shortness Of Breath   Iodinated Contrast Media Shortness Of Breath    Metabolic Disorder Labs: Lab Results  Component Value Date   HGBA1C 4.8 12/22/2023   MPG 91.06 12/22/2023   MPG 96.8 03/05/2022   No results found for: "PROLACTIN" Lab Results  Component Value Date   CHOL 127 12/22/2023   TRIG 90 12/22/2023   HDL 41 12/22/2023   CHOLHDL 3.1 12/22/2023   VLDL 18 12/22/2023   LDLCALC 68  12/22/2023   LDLCALC 52 03/06/2022     Current Medications: Current Outpatient Medications  Medication Sig Dispense Refill   alendronate (FOSAMAX) 70 MG tablet Take 70 mg by mouth once a week.     ALPRAZolam  (XANAX ) 1 MG tablet Take 1 tablet (1 mg total) by mouth 2 (two) times daily. 90 tablet 3   docusate sodium  (COLACE) 100 MG capsule Take 1 capsule (100 mg total) by mouth 2 (two) times  daily. (Patient not taking: Reported on 12/22/2023) 10 capsule 0   hydroxychloroquine  (PLAQUENIL ) 200 MG tablet Take 200 mg by mouth daily.     meclizine  (ANTIVERT ) 25 MG tablet Take 1 tablet (25 mg total) by mouth 3 (three) times daily as needed for dizziness. 30 tablet 0   predniSONE  (DELTASONE ) 10 MG tablet Prednisone  40 mg po daily x 2 day then Prednisone  30 mg po daily x 2 day then Prednisone  20 mg po daily x 2 day then Prednisone  10 mg daily x 2 day then start taking prednisone  5 mg daily till you  see your rheumatologist (Patient taking differently: Take 5-10 mg by mouth See admin instructions. Prednisone  40 mg po daily x 2 day then Prednisone  30 mg po daily x 2 day then Prednisone  20 mg po daily x 2 day then Prednisone  10 mg daily x 2 day then start taking prednisone  5 mg daily till you  see your rheumatologist) 25 tablet 0   predniSONE  (DELTASONE ) 5 MG tablet Take 1 tablet (5 mg total) by mouth daily with breakfast. (Patient not taking: Reported on 12/22/2023) 30 tablet 3   rosuvastatin  (CRESTOR ) 10 MG tablet Take 10 mg by mouth in the morning.     warfarin (COUMADIN ) 4 MG tablet Take 4 mg by mouth daily.     zolpidem  (AMBIEN ) 10 MG tablet Take 1 tablet (10 mg total) by mouth at bedtime as needed for sleep. 30 tablet 3   No current facility-administered medications for this visit.    Neurologic: Headache: No Seizure: No Paresthesias:No  Musculoskeletal: Strength & Muscle Tone: within normal limits Gait & Station: normal Patient leans: N/A  Psychiatric Specialty Exam: ROS  Blood pressure  119/68, pulse 73, resp. rate 18, height 5\' 8"  (1.727 m), weight 156 lb 12.8 oz (71.1 kg).Body mass index is 23.84 kg/m.  General Appearance: Casual  Eye Contact:  Good  Speech:  Clear and Coherent  Volume:  Normal  Mood:  Dysphoric  Affect:  Appropriate  Thought Process:  Goal Directed  Orientation:  NA  Thought Content:  Logical  Suicidal Thoughts:  No  Homicidal Thoughts:  No  Memory:  NA  Judgement:  Good  Insight:  Good  Psychomotor Activity:  Normal  Concentration:    Recall:  Fair  Fund of Knowledge:Fair  Language: Good  Akathisia:  No  Handed:  Right  AIMS (if indicated):    Assets:  Desire for Improvement  ADL's:  Intact  Cognition: WNL  Sleep:      Treatment Plan Summary: 2/25/20254:52 PM     This patient's diagnosis is major depression.  She is now taking desipramine  50 mg and feels much better.  She was taking Wellbutrin  until she apparently had a syncopal like episode.  I suspect they thought the Wellbutrin  was somehow involved and they discontinued it in the hospital.  Nonetheless her mood is better.  In addition of major depression she has panic disorder and she will continue taking Xanax  1 mg twice daily and 1 mg as needed if needed.  Her third problem is asleep disorder and will continue taking Ambien  which works very well.  Patient to be seen again in 6 weeks.  Her hope is to limit her panic episodes.  I am sure the diagnosis of Addison's probably plays a role in her emotional state.  She no longer is taking prednisone .  She is now on hydrocortisone .

## 2024-04-06 DIAGNOSIS — E2740 Unspecified adrenocortical insufficiency: Secondary | ICD-10-CM | POA: Diagnosis not present

## 2024-04-06 DIAGNOSIS — M810 Age-related osteoporosis without current pathological fracture: Secondary | ICD-10-CM | POA: Diagnosis not present

## 2024-04-06 DIAGNOSIS — N1831 Chronic kidney disease, stage 3a: Secondary | ICD-10-CM | POA: Diagnosis not present

## 2024-04-06 DIAGNOSIS — M797 Fibromyalgia: Secondary | ICD-10-CM | POA: Diagnosis not present

## 2024-04-06 DIAGNOSIS — Z79899 Other long term (current) drug therapy: Secondary | ICD-10-CM | POA: Diagnosis not present

## 2024-04-06 DIAGNOSIS — M0609 Rheumatoid arthritis without rheumatoid factor, multiple sites: Secondary | ICD-10-CM | POA: Diagnosis not present

## 2024-04-06 DIAGNOSIS — Z86718 Personal history of other venous thrombosis and embolism: Secondary | ICD-10-CM | POA: Diagnosis not present

## 2024-04-06 DIAGNOSIS — Z8739 Personal history of other diseases of the musculoskeletal system and connective tissue: Secondary | ICD-10-CM | POA: Diagnosis not present

## 2024-04-06 DIAGNOSIS — M1990 Unspecified osteoarthritis, unspecified site: Secondary | ICD-10-CM | POA: Diagnosis not present

## 2024-04-11 DIAGNOSIS — Z7901 Long term (current) use of anticoagulants: Secondary | ICD-10-CM | POA: Diagnosis not present

## 2024-04-12 DIAGNOSIS — Z79899 Other long term (current) drug therapy: Secondary | ICD-10-CM | POA: Diagnosis not present

## 2024-04-12 DIAGNOSIS — H5203 Hypermetropia, bilateral: Secondary | ICD-10-CM | POA: Diagnosis not present

## 2024-04-12 DIAGNOSIS — H31002 Unspecified chorioretinal scars, left eye: Secondary | ICD-10-CM | POA: Diagnosis not present

## 2024-04-19 DIAGNOSIS — I951 Orthostatic hypotension: Secondary | ICD-10-CM | POA: Diagnosis not present

## 2024-04-19 DIAGNOSIS — E2740 Unspecified adrenocortical insufficiency: Secondary | ICD-10-CM | POA: Diagnosis not present

## 2024-04-19 DIAGNOSIS — N1831 Chronic kidney disease, stage 3a: Secondary | ICD-10-CM | POA: Diagnosis not present

## 2024-04-19 DIAGNOSIS — K5900 Constipation, unspecified: Secondary | ICD-10-CM | POA: Diagnosis not present

## 2024-04-19 DIAGNOSIS — E7800 Pure hypercholesterolemia, unspecified: Secondary | ICD-10-CM | POA: Diagnosis not present

## 2024-04-19 DIAGNOSIS — M069 Rheumatoid arthritis, unspecified: Secondary | ICD-10-CM | POA: Diagnosis not present

## 2024-04-19 DIAGNOSIS — E559 Vitamin D deficiency, unspecified: Secondary | ICD-10-CM | POA: Diagnosis not present

## 2024-04-19 DIAGNOSIS — M329 Systemic lupus erythematosus, unspecified: Secondary | ICD-10-CM | POA: Diagnosis not present

## 2024-04-19 DIAGNOSIS — I10 Essential (primary) hypertension: Secondary | ICD-10-CM | POA: Diagnosis not present

## 2024-04-19 DIAGNOSIS — G4700 Insomnia, unspecified: Secondary | ICD-10-CM | POA: Diagnosis not present

## 2024-04-19 DIAGNOSIS — Z86711 Personal history of pulmonary embolism: Secondary | ICD-10-CM | POA: Diagnosis not present

## 2024-04-19 DIAGNOSIS — Z8673 Personal history of transient ischemic attack (TIA), and cerebral infarction without residual deficits: Secondary | ICD-10-CM | POA: Diagnosis not present

## 2024-04-27 DIAGNOSIS — D3132 Benign neoplasm of left choroid: Secondary | ICD-10-CM | POA: Diagnosis not present

## 2024-04-27 DIAGNOSIS — H35432 Paving stone degeneration of retina, left eye: Secondary | ICD-10-CM | POA: Diagnosis not present

## 2024-04-27 DIAGNOSIS — H43823 Vitreomacular adhesion, bilateral: Secondary | ICD-10-CM | POA: Diagnosis not present

## 2024-04-27 DIAGNOSIS — Z79899 Other long term (current) drug therapy: Secondary | ICD-10-CM | POA: Diagnosis not present

## 2024-05-09 ENCOUNTER — Other Ambulatory Visit (HOSPITAL_COMMUNITY): Payer: Self-pay

## 2024-05-09 DIAGNOSIS — Z7901 Long term (current) use of anticoagulants: Secondary | ICD-10-CM | POA: Diagnosis not present

## 2024-05-09 DIAGNOSIS — Z86711 Personal history of pulmonary embolism: Secondary | ICD-10-CM | POA: Diagnosis not present

## 2024-05-12 ENCOUNTER — Other Ambulatory Visit: Payer: Self-pay | Admitting: Neurology

## 2024-05-17 ENCOUNTER — Ambulatory Visit (HOSPITAL_BASED_OUTPATIENT_CLINIC_OR_DEPARTMENT_OTHER): Admitting: Psychiatry

## 2024-05-17 DIAGNOSIS — F324 Major depressive disorder, single episode, in partial remission: Secondary | ICD-10-CM | POA: Diagnosis not present

## 2024-05-17 MED ORDER — ZOLPIDEM TARTRATE 10 MG PO TABS: 10.0000 mg | ORAL_TABLET | Freq: Every evening | ORAL | 3 refills | Status: DC | PRN

## 2024-05-17 MED ORDER — DESIPRAMINE HCL 25 MG PO TABS: ORAL_TABLET | ORAL | 4 refills | Status: AC

## 2024-05-17 NOTE — Progress Notes (Signed)
 Past Psychiatric Initial Adult Assessment   Patient Identification: Lauren Chung MRN:  [id number] Date of Evaluation:  12/29/2023 Referral Source [facility] Chief Complaint:    Visit Diagnosis:     Today the patient is at her baseline.  She is a very slow lifestyle.  Some way she is doing actually well.  She denies persistent daily depression.  She says desipramine  50 mg has been helpful.  She is having rare panic attacks.  It is better on Xanax  1 mg twice daily and 1 as needed.  She has Addison's disease which is an episodic condition where she will have bad days characterized by dizziness and bossiness and GI symptoms.  Her blood pressure will be affected.  She will appear where she becomes tachycardic and then becomes anxious and may be misinterpreted is a panic episode.  The patient is sleeping pretty well taking Ambien  10 mg.  Venetia her son seems to be pretty stable at this time.  Generally the patient is eating and sleeping.  Her energy level was fairly good.  She watches a lot of television.  She does walk documentaries and other shows.  She does not have any hobbies.  The patient is no romance at this time.  She says she has really no close friends.  She lives a fairly isolated life.  However when she compares her life now 2 living in [place]  she months chooses to be here in [place] .  She says her neighbor disquieting comfortably.  Generally she feels fairly well. Virtual Visit via Telephone Note  I connected with Lauren Chung on 12/29/23 at  4:30 PM EST by telephone and verified that I am speaking with the correct person using two identifiers.  Location: Patient: home Provider: office   I discussed the limitations, risks, security and privacy concerns of performing an evaluation and management service by telephone and the availability of in person appointments. I also discussed with the patient that there may be a patient responsible charge related to this service. The  patient expressed understanding and agreed to proceed.      I discussed the assessment and treatment plan with the patient. The patient was provided an opportunity to ask questions and all were answered. The patient agreed with the plan and demonstrated an understanding of the instructions.   The patient was advised to call back or seek an in-person evaluation if the symptoms worsen or if the condition fails to improve as anticipated.  I provided 25 minutes of non-face-to-face time during this encounter.   [name], MD    Anxiety Symptoms:   Psychotic Symptoms:   PTSD Symptoms:   Past Psychiatric History: Past therapy, one psychiatric hospitalization  Previous Psychotropic Medications: Yes   Substance Abuse History in the last 12 months:  No.  Consequences of Substance Abuse: Negative  Past Medical History:  Past Medical History:  Diagnosis Date   Anemia    Anxiety    Arthritis    CKD (chronic kidney disease) stage 3, GFR 30-59 ml/min (HCC)    Depression    History of blood transfusion    in [place]   Hyperlipidemia    Hypertension    Lupus    PE (pulmonary embolism)    Pneumonia    x 3 - last time 2017   PTSD (post-traumatic stress disorder)    Renal disorder    Rheumatoid arthritis (HCC)    Seizures (HCC)    Stroke (HCC)    many years  ago per patient, no deficits   SVD (spontaneous vaginal delivery)    x 3   TIA (transient ischemic attack)    many years ago per patient, no deficits    Past Surgical History:  Procedure Laterality Date   ABDOMINAL SURGERY     due internal bleeding   ABLATION     gyn procedure for bleeding   CYST EXCISION     top of head   EXCISION MASS HEAD N/A 08/05/2019   Procedure: EXCISION OF SCALP CYST;  Surgeon: Jesus Oliphant, MD;  Location: [facility];  Service: ENT;  Laterality: N/A;    Family Psychiatric History:   Family History:  Family History  Problem Relation Age of Onset   Drug abuse Mother     Alcohol abuse Mother    Leukemia Mother    Leukemia Father    Seizures Daughter     Social History:   Social History   Socioeconomic History   Marital status: Single    Spouse name: Not on file   Number of children: 3   Years of education: Not on file   Highest education level: Not on file  Occupational History   Not on file  Tobacco Use   Smoking status: Never   Smokeless tobacco: Never  Vaping Use   Vaping status: Never Used  Substance and Sexual Activity   Alcohol use: Never   Drug use: Never   Sexual activity: Not Currently    Partners: Male    Birth control/protection: Condom, Post-menopausal  Other Topics Concern   Not on file  Social History Narrative   R handed   Live with son and 3 dogs   Two story   No Caffeine   Social Drivers of Corporate investment banker Strain: Not on file  Food Insecurity: Food Insecurity Present (12/22/2023)   Hunger Vital Sign    Worried About Running Out of Food in the Last Year: Sometimes true    Ran Out of Food in the Last Year: Sometimes true  Transportation Needs: No Transportation Needs (12/22/2023)   PRAPARE - Administrator, Civil Service (Medical): No    Lack of Transportation (Non-Medical): No  Physical Activity: Not on file  Stress: Not on file  Social Connections: Socially Isolated (12/22/2023)   Social Connection and Isolation Panel [NHANES]    Frequency of Communication with Friends and Family: Once a week    Frequency of Social Gatherings with Friends and Family: Once a week    Attends Religious Services: Never    Database administrator or Organizations: No    Attends Banker Meetings: Never    Marital Status: Divorced    Additional Social History:   Allergies:   Allergies  Allergen Reactions   Acetaminophen  Nausea And Vomiting   Belsomra  [Suvorexant ] Shortness Of Breath and Rash   Codeine Shortness Of Breath   Iodinated Contrast Media Shortness Of Breath    Metabolic Disorder  Labs: Lab Results  Component Value Date   HGBA1C 4.8 12/22/2023   MPG 91.06 12/22/2023   MPG 96.8 03/05/2022   No results found for: PROLACTIN Lab Results  Component Value Date   CHOL 127 12/22/2023   TRIG 90 12/22/2023   HDL 41 12/22/2023   CHOLHDL 3.1 12/22/2023   VLDL 18 12/22/2023   LDLCALC 68 12/22/2023   LDLCALC 52 03/06/2022     Current Medications: Current Outpatient Medications  Medication Sig Dispense Refill   alendronate (  FOSAMAX) 70 MG tablet Take 70 mg by mouth once a week.     ALPRAZolam  (XANAX ) 1 MG tablet Take 1 tablet (1 mg total) by mouth 2 (two) times daily. 90 tablet 3   docusate sodium  (COLACE) 100 MG capsule Take 1 capsule (100 mg total) by mouth 2 (two) times daily. (Patient not taking: Reported on 12/22/2023) 10 capsule 0   hydroxychloroquine  (PLAQUENIL ) 200 MG tablet Take 200 mg by mouth daily.     meclizine  (ANTIVERT ) 25 MG tablet Take 1 tablet (25 mg total) by mouth 3 (three) times daily as needed for dizziness. 30 tablet 0   predniSONE  (DELTASONE ) 10 MG tablet Prednisone  40 mg po daily x 2 day then Prednisone  30 mg po daily x 2 day then Prednisone  20 mg po daily x 2 day then Prednisone  10 mg daily x 2 day then start taking prednisone  5 mg daily till you  see your rheumatologist (Patient taking differently: Take 5-10 mg by mouth See admin instructions. Prednisone  40 mg po daily x 2 day then Prednisone  30 mg po daily x 2 day then Prednisone  20 mg po daily x 2 day then Prednisone  10 mg daily x 2 day then start taking prednisone  5 mg daily till you  see your rheumatologist) 25 tablet 0   predniSONE  (DELTASONE ) 5 MG tablet Take 1 tablet (5 mg total) by mouth daily with breakfast. (Patient not taking: Reported on 12/22/2023) 30 tablet 3   rosuvastatin  (CRESTOR ) 10 MG tablet Take 10 mg by mouth in the morning.     warfarin (COUMADIN ) 4 MG tablet Take 4 mg by mouth daily.     zolpidem  (AMBIEN ) 10 MG tablet Take 1 tablet (10 mg total) by mouth at bedtime as needed  for sleep. 30 tablet 3   No current facility-administered medications for this visit.    Neurologic: Headache: No Seizure: No Paresthesias:No  Musculoskeletal: Strength & Muscle Tone: within normal limits Gait & Station: normal Patient leans: N/A  Psychiatric Specialty Exam: ROS  Blood pressure 119/68, pulse 73, resp. rate 18, height 5' 8 (1.727 m), weight 156 lb 12.8 oz (71.1 kg).Body mass index is 23.84 kg/m.  General Appearance: Casual  Eye Contact:  Good  Speech:  Clear and Coherent  Volume:  Normal  Mood:  Dysphoric  Affect:  Appropriate  Thought Process:  Goal Directed  Orientation:  NA  Thought Content:  Logical  Suicidal Thoughts:  No  Homicidal Thoughts:  No  Memory:  NA  Judgement:  Good  Insight:  Good  Psychomotor Activity:  Normal  Concentration:    Recall:  Fair  Fund of Knowledge:Fair  Language: Good  Akathisia:  No  Handed:  Right  AIMS (if indicated):    Assets:  Desire for Improvement  ADL's:  Intact  Cognition: WNL  Sleep:      Treatment Plan Summary: 2/25/20254:52 PM     Today's date is 05/17/2024.  This patient's diagnosis is major depression and does well on desipramine .  She also has a panic disorder and takes Xanax  1 mg twice daily.  She also has insomnia and she takes Ambien  for this condition.  The patient is dealing with Addison's disease.  Overall though the patient is stable.  She drinks no alcohol and uses no drugs.  She will return to see me in 3 months.

## 2024-05-26 ENCOUNTER — Ambulatory Visit: Admitting: Neurology

## 2024-05-26 ENCOUNTER — Encounter: Payer: Self-pay | Admitting: Neurology

## 2024-05-26 VITALS — BP 119/83 | HR 92 | Ht 68.0 in | Wt 173.8 lb

## 2024-05-26 DIAGNOSIS — R55 Syncope and collapse: Secondary | ICD-10-CM

## 2024-05-26 DIAGNOSIS — R2990 Unspecified symptoms and signs involving the nervous system: Secondary | ICD-10-CM

## 2024-05-26 DIAGNOSIS — M328 Other forms of systemic lupus erythematosus: Secondary | ICD-10-CM

## 2024-05-26 MED ORDER — MECLIZINE HCL 25 MG PO TABS: 25.0000 mg | ORAL_TABLET | Freq: Three times a day (TID) | ORAL | 0 refills | Status: AC | PRN

## 2024-05-26 NOTE — Patient Instructions (Signed)
Near-Syncope Near-syncope is when you suddenly feel like you might pass out or faint. This may also be called presyncope. During an episode of near-syncope, you may: Feel dizzy, weak, or light-headed. It may feel like the room is spinning. Feel like you may vomit (nauseous). See spots or see all white or all black. Have cold, clammy skin. Feel warm and sweaty. Hear ringing in your ears. This condition is caused by a sudden decrease in blood flow to the brain. This can result from many causes, but most of those causes are not dangerous. However, near-syncope may be a sign of a serious medical problem, so it is important to seek medical care. Follow these instructions at home: Medicines Take over-the-counter and prescription medicines only as told by your doctor. If you are taking blood pressure or heart medicine, get up slowly and spend many minutes getting ready to sit and then stand. This can help with dizziness. Lifestyle Do not drive, use machinery, or play sports until your doctor says it is okay. Do not drink alcohol. Do not smoke or use any products that contain nicotine or tobacco. If you need help quitting, ask your doctor. Avoid hot tubs and saunas. General instructions Be aware of any changes in your symptoms. Talk with your doctor about your symptoms. You may need to have testing to help find the cause. If you start to feel like you might pass out, sit or lie down right away. If sitting, lower your head down between your legs. If lying down, raise (elevate) your feet above the level of your heart. Breathe deeply and steadily. Wait until all of the symptoms are gone. Have someone stay with you until you feel better. Drink enough fluid to keep your pee (urine) pale yellow. Avoid standing for a long time. If you must stand for a long time, do movements such as: Moving your legs. Crossing your legs. Flexing and stretching your leg muscles. Squatting. Keep all follow-up  visits. Contact a doctor if: You continue to have episodes of near fainting. Get help right away if: You pass out or faint. You have any of these symptoms: Fast or uneven heartbeats (palpitations). Pain in your chest, belly, or back. Shortness of breath. You have a seizure. You have a very bad headache. You are confused. You have trouble seeing. You are very weak. You have trouble walking. You are bleeding from your mouth or butt. You have black or tarry poop (stool). These symptoms may be an emergency. Get help right away. Call your local emergency services (911 in the [place] Do not wait to see if the symptoms will go away. Do not drive yourself to the hospital. Summary Near-syncope is when you suddenly feel like you might pass out or faint. This condition is caused by a sudden decrease in blood flow to the brain. Near-syncope may be a sign of a serious medical problem, so it is important to seek medical care. If you start to feel like you might pass out, sit or lie down right away. If sitting, lower your head down between your legs. If lying down, raise (elevate) your feet above the level of your heart. Talk with your doctor about your symptoms. You may need to have testing to help find the cause. This information is not intended to replace advice given to you by your health care provider. Make sure you discuss any questions you have with your health care provider. Document Revised: 02/28/2021 Document Reviewed: 02/28/2021 Elsevier Patient Education  2024  [employer].

## 2024-05-26 NOTE — Progress Notes (Signed)
 Provider:  Dedra Gores, MD  Primary Care Physician:  Seabron Lenis, MD [address] [place] [address]     Referring Provider: Seabron Lenis, Md [address] [place],  [place] [address]          Chief Complaint according to patient   Patient presents with:          3 month follow up from EEG , she stated that she feeling tried and weak, she stated that she has no strength       HISTORY OF PRESENT ILLNESS:  Lauren Chung is a 58 y.o. female patient who is here for revisit  on 05/26/2024 after normal EEG - no seizures identified.    Chief concern according to patient :  I feel like I have addison crisis, very weak , fatigued al the time, diffuse pain from lupus. Major depression is treated , sees Dr .Tasia.  Has osteoporosis.  Dr Tommas has done corticoid testing _ unspecified  adrenocortical insufficiency with low ACTH .    Back to neurology :  she was admitted with tongue bite and  altered mental status to ED : This was first interpreted as stroke like symptoms (?) and  occurred on 12-31-2023. There she had one abnormal . Left brain slowing . Followed by a normal EEG in Hospital at d/c and here.was her third and second normal EEG .  We discussed  a overnight EEG expanded sleep study but see low yield.  She has insomnia not  Apnea or snoring.  She is tired and fatigued, not excessively daytime sleepy- she wears a smart watch which has never detected any hypoxia .   CONSULT NOTE :  Lauren Chung is a 58 y.o. female of [place] descent and seen here upon referral from Dr. Drusilla for a Consultation/ Evaluation of a seizure..   Treated for Rheumatoid arthritis without rheumatoid factor, multiple sites (HCC)  and Lupus erythematodes, with lupus anticoagulant, having had PE.  CVa 2024, HTN affecting her renal function. She has been on Plaquenil  and steroids , CKD stage 3.  she has seen Dr Michele  for palpitations, Lauren Chung is a  58 y.o. female whose past medical history and cardiovascular risk factors include: Hx of pulmonary embolism, hx of CVA /TIA, Lupus, HLD, HTN, CKD, iron deficiency anemia.          She presented on 12-13-2023 to the Ed in altered mental status,  extremely drowsy, barely speaking and there was  a low BP .  Seizure was not witnessed, she was actually found on the floor to her bedroom, blocking access. She had risen from bed and was already extremely drowsy.  Nobody saw a seizure !!! She had a tongue bite..   She came to ED and there an EEG was ordered.    This 58 !!!-year-old female with past medical history of lupus, seronegative rheumatoid arthritis, HTN, HLD, anxiety and depression, CKD stage IIIa, PTSD, history of CVA.  [name] , Plaquenil , daily prednisone .  Patient has no memory of what occurred today.  [name].  Per report patient had severe fatigue the last few days.  Today family had difficulty waking her up.  She was brought to the ER.  It took over 6 hours for before patient woke.  Per patient she has had fever on and off for the past 2-1/2 months, Tmax 104.  She reports  intermittent headache for the last several months.  She endorses neck pain and occipital head pain.  Her headache can also be generalized.  She endorses cough, dizziness and loss of headaches.  She reports occasional confusion.  Patient states she has not discussed her fever with any physician.  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     Review of Systems: Out of a complete 14 system review, the patient complains of only the following symptoms, and all other reviewed systems are negative.:    Vertigo   SLEEPINESS ?  How likely are you to doze in the following situations: 0 = not likely, 1 = slight chance, 2 = moderate chance,  3 = high chance  Sitting and Reading? Watching Television? Sitting inactive in a public place (theater or meeting)? Lying down in the afternoon when circumstances permit? Sitting and talking to someone? Sitting quietly after lunch without alcohol? In a car, while stopped for a few minutes in traffic? As a passenger in a car for an hour without a break?  Total = 5/ 24   cannot go to sleep easily.   FSS 48/ 63 - explained by depression and by adrenal disorder.        Social History   Socioeconomic History   Marital status: Single    Spouse name: Not on file   Number of children: 3   Years of education: Not on file   Highest education level: Not on file  Occupational History   Not on file  Tobacco Use   Smoking status: Never   Smokeless tobacco: Never  Vaping Use   Vaping status: Never Used  Substance and Sexual Activity   Alcohol use: Never   Drug use: Never   Sexual activity: Not Currently    Partners: Male    Birth control/protection: Condom, Post-menopausal  Other Topics Concern   Not on file  Social History Narrative   R handed   Live with son and 3 dogs   Two story   No Caffeine   Social Drivers of Corporate investment banker Strain: Not on file  Food Insecurity: Food Insecurity Present (01/06/2024)   Hunger Vital Sign    Worried About Running Out of Food in the Last Year: Sometimes true    Ran Out of Food in the Last Year: Sometimes true  Transportation Needs: No Transportation Needs (01/06/2024)   PRAPARE - Administrator, Civil Service (Medical): No    Lack of Transportation (Non-Medical): No  Physical Activity: Not on file  Stress: Not on file  Social Connections: Socially Isolated (01/06/2024)   Social Connection and Isolation Panel    Frequency of Communication with Friends and Family: Once a week    Frequency of Social Gatherings with Friends and Family: Once a week    Attends Religious Services: Never    Database administrator or  Organizations: No    Attends Engineer, structural: Never    Marital Status: Divorced    Family History  Problem Relation Age of Onset   Drug abuse Mother    Alcohol abuse Mother    Leukemia Mother    Leukemia Father    Seizures Daughter     Past Medical History:  Diagnosis Date   Anemia    Anxiety    Arthritis  CKD (chronic kidney disease) stage 3, GFR 30-59 ml/min (HCC)    Depression    History of blood transfusion    in [place]   Hyperlipidemia    Hypertension    Lupus    PE (pulmonary embolism)    Pneumonia    x 3 - last time 2017   PTSD (post-traumatic stress disorder)    Renal disorder    Rheumatoid arthritis (HCC)    Seizures (HCC)    Numbness  hands and arms, repeated.     many years ago per patient, no deficits   SVD (spontaneous vaginal delivery)    x 3   TIA (transient ischemic attack) times 3/ stroke  without deficit     many years ago per patient, no deficits    Past Surgical History:  Procedure Laterality Date   ABDOMINAL SURGERY     due internal bleeding   ABLATION     gyn procedure for bleeding   CYST EXCISION     top of head   EXCISION MASS HEAD N/A 08/05/2019   Procedure: EXCISION OF SCALP CYST;  Surgeon: Jesus Oliphant, MD;  Location: [facility];  Service: ENT;  Laterality: N/A;     Current Outpatient Medications on File Prior to Visit  Medication Sig Dispense Refill   ALPRAZolam  (XANAX ) 1 MG tablet 1 bid and 1 as needed for Panic attacks 70 tablet 4   desipramine  (NORPRAMIN ) 25 MG tablet 1 qam for 1 week then 2  qam 60 tablet 4   hydrocortisone  (CORTEF ) 5 MG tablet Take by mouth.     hydroxychloroquine  (PLAQUENIL ) 200 MG tablet Take 200 mg by mouth daily.     meclizine  (ANTIVERT ) 25 MG tablet Take 1 tablet (25 mg total) by mouth 3 (three) times daily as needed for dizziness. 30 tablet 0   metoprolol  succinate (TOPROL -XL) 25 MG 24 hr tablet Take 25 mg by mouth every morning.     predniSONE  (DELTASONE ) 10 MG tablet  Take 4 tablets a day for 3 days, then 3 tablets a day for 3 days, then 2 tablets a day for 3 days, then 1 tablet a day thereafter until you see your primary care doctor 50 tablet 0   rosuvastatin  (CRESTOR ) 10 MG tablet Take 10 mg by mouth in the morning.     senna (SENOKOT) 8.6 MG tablet Take 1 tablet by mouth daily.     traZODone (DESYREL) 50 MG tablet Take 50 mg by mouth at bedtime.     Vitamin D, Ergocalciferol, (DRISDOL) 1.25 MG (50000 UNIT) CAPS capsule Take 50,000 Units by mouth 2 (two) times a week.     warfarin (COUMADIN ) 4 MG tablet Take 4 mg by mouth daily.     zolpidem  (AMBIEN ) 10 MG tablet Take 1 tablet (10 mg total) by mouth at bedtime as needed for sleep. 30 tablet 3   No current facility-administered medications on file prior to visit.    Allergies  Allergen Reactions   Acetaminophen  Nausea And Vomiting   Belsomra  [Suvorexant ] Shortness Of Breath and Rash   Codeine Shortness Of Breath   Iodinated Contrast Media Shortness Of Breath     DIAGNOSTIC DATA (LABS, IMAGING, TESTING) - I reviewed patient records, labs, notes, testing and imaging myself where available.  Lab Results  Component Value Date   WBC 12.9 (H) 01/13/2024   HGB 12.2 01/13/2024   HCT 36.1 01/13/2024   MCV 88.0 01/13/2024   PLT 248 01/13/2024      Component  Value Date/Time   NA 140 01/13/2024 0341   NA 134 06/13/2020 1356   NA 142 08/11/2016 1613   K 3.1 (L) 01/13/2024 0341   K 3.4 (L) 08/11/2016 1613   CL 109 01/13/2024 0341   CO2 25 01/13/2024 0341   CO2 24 08/11/2016 1613   GLUCOSE 86 01/13/2024 0341   GLUCOSE 80 08/11/2016 1613   BUN 19 01/13/2024 0341   BUN 13 06/13/2020 1356   BUN 7.3 08/11/2016 1613   CREATININE 1.12 (H) 01/13/2024 0341   CREATININE 1.26 (H) 10/07/2022 1514   CREATININE 1.0 08/11/2016 1613   CALCIUM  8.8 (L) 01/13/2024 0341   CALCIUM  9.5 08/11/2016 1613   PROT 6.0 (L) 01/05/2024 2313   PROT 7.8 08/11/2016 1613   ALBUMIN  3.6 (L) 01/08/2024 1410   ALBUMIN  4.0  08/11/2016 1613   AST 28 01/05/2024 2313   AST 28 10/07/2022 1514   AST 28 08/11/2016 1613   ALT 31 01/05/2024 2313   ALT 26 10/07/2022 1514   ALT 18 08/11/2016 1613   ALKPHOS 70 01/05/2024 2313   ALKPHOS 84 08/11/2016 1613   BILITOT 0.5 01/05/2024 2313   BILITOT 0.5 10/07/2022 1514   BILITOT 0.50 08/11/2016 1613   GFRNONAA 58 (L) 01/13/2024 0341   GFRNONAA 50 (L) 10/07/2022 1514   GFRAA 46 (L) 06/13/2020 1356   Lab Results  Component Value Date   CHOL 127 12/22/2023   HDL 41 12/22/2023   LDLCALC 68 12/22/2023   TRIG 90 12/22/2023   CHOLHDL 3.1 12/22/2023   Lab Results  Component Value Date   HGBA1C 4.8 12/22/2023   Lab Results  Component Value Date   VITAMINB12 882 01/07/2024   Lab Results  Component Value Date   TSH 1.580 12/22/2023    PHYSICAL EXAM:  Vitals:   05/26/24 1526  BP: 119/83  Pulse: 92   No data found. Body mass index is 26.43 kg/m.   Wt Readings from Last 3 Encounters:  05/26/24 173 lb 12.8 oz (78.8 kg)  01/05/24 169 lb 12.1 oz (77 kg)  12/31/23 169 lb 12.8 oz (77 kg)     Ht Readings from Last 3 Encounters:  05/26/24 5' 8 (1.727 m)  01/05/24 5' 8 (1.727 m)  12/31/23 5' 8 (1.727 m)      General: The patient is awake, alert and appears not in acute distress and groomed. Head: Normocephalic, atraumatic.  Neck is supple. Mallampati 2,  neck circumference:15 inches . Nasal airflow  patent.   Overbite Dwan is  seen.  Dental status: biological Cardiovascular:  Regular rate and cardiac rhythm by pulse, without distended neck veins. Respiratory: no shortness of breath  Skin:  Without evidence of ankle edema, or rash.     NEUROLOGIC EXAM: The patient is awake and alert, oriented to place and time.   Memory subjective described as intact.  Attention span & concentration ability appears normal.   Speech is fluent,  without  dysarthria, dysphonia or aphasia.  Mood and affect are appropriate.   Cranial nerves: Pupils are  equal and briskly reactive to light.  Funduscopic exam without  evidence of pallor or edema. Extraocular movements  in vertical and horizontal planes intact and without nystagmus. Visual fields by finger perimetry are intact. Hearing to finger rub intact.  Facial sensation intact to fine touch. Facial motor strength is symmetric and tongue and uvula move midline.   Motor exam:   Normal tone and normal muscle bulk and symmetric normal strength in all extremities. Grip  Strength was reduced but symmetric  biceps left gave way during testing, functional.  Hip flexion weakness on the left could be functional, too.  Proximal strength of shoulder muscles and hip flexors was intact    Sensory:  Fine touch and vibration were tested . Proprioception was tested in the upper extremities only and was  normal.   Coordination: Rapid alternating movements in the fingers/hands were normal.  Finger-to-nose maneuver was tested and showed evidence of ataxia, dysmetria    Gait and station: Patient walked with assistive device - walker .    Core Strength abnormal, she has been able  to stand up without bracing but used momentum. She has been walking very slow and turned even slower, feeling dizzy the whole way.   She is looking at her feet.  Looking to the end of the hallways door causes her to feel dizzy.      Deep tendon reflexes: in the  upper and lower extremities are symmetric and  brisk  and cross reflexia is seen. No Clonus. Babinski maneuver response is  downgoing.      ASSESSMENT AND PLAN :   58 y.o. year old female  here with:    1) transient alteration of awareness, in LUPUS, MAJOR DEPRESSION, abnormal adrenal function tests,  and possible syncope versus seizure.  Most likely spells related to addison's disease.   2) repeat EEG were normal, a total of 2 normal EEGs now and only the initial admission EEG had left sided slowing.    MRI was reviewed together here today.  No changes , white  matter disease can be related to migraine, HTN, and lupus.   I will release the patient from neurological care , and asked her to resume driving in a month. 6 months after her spell.    After spending a total time of  19  minutes face to face and time for  history taking, physical and neurologic examination, review of laboratory studies,  personal review of imaging studies, reports and results of other testing and review of referral information / records as far as provided in visit,   Electronically signed by: Dedra Gores, MD 05/26/2024 4:35 PM  [facility] and [facility] Sleep Board certified by The [employer] of Sleep Medicine and Diplomate of the [employer] of Sleep Medicine. Board certified In Neurology through the ABPN, Fellow of the [employer] of Neu[employer].

## 2024-06-06 ENCOUNTER — Emergency Department (HOSPITAL_COMMUNITY)

## 2024-06-06 ENCOUNTER — Emergency Department (HOSPITAL_COMMUNITY)
Admission: EM | Admit: 2024-06-06 | Discharge: 2024-06-06 | Disposition: A | Discharge status: Home / Self Care | Attending: Emergency Medicine | Admitting: Emergency Medicine

## 2024-06-06 ENCOUNTER — Other Ambulatory Visit: Payer: Self-pay

## 2024-06-06 DIAGNOSIS — D72829 Elevated white blood cell count, unspecified: Secondary | ICD-10-CM | POA: Insufficient documentation

## 2024-06-06 DIAGNOSIS — R0602 Shortness of breath: Secondary | ICD-10-CM | POA: Diagnosis not present

## 2024-06-06 DIAGNOSIS — Z7901 Long term (current) use of anticoagulants: Secondary | ICD-10-CM | POA: Diagnosis not present

## 2024-06-06 DIAGNOSIS — I129 Hypertensive chronic kidney disease with stage 1 through stage 4 chronic kidney disease, or unspecified chronic kidney disease: Secondary | ICD-10-CM | POA: Diagnosis not present

## 2024-06-06 DIAGNOSIS — R7989 Other specified abnormal findings of blood chemistry: Secondary | ICD-10-CM | POA: Diagnosis not present

## 2024-06-06 DIAGNOSIS — F419 Anxiety disorder, unspecified: Secondary | ICD-10-CM | POA: Insufficient documentation

## 2024-06-06 DIAGNOSIS — R0989 Other specified symptoms and signs involving the circulatory and respiratory systems: Secondary | ICD-10-CM | POA: Diagnosis not present

## 2024-06-06 DIAGNOSIS — N183 Chronic kidney disease, stage 3 unspecified: Secondary | ICD-10-CM | POA: Diagnosis not present

## 2024-06-06 DIAGNOSIS — M62838 Other muscle spasm: Secondary | ICD-10-CM | POA: Insufficient documentation

## 2024-06-06 DIAGNOSIS — R404 Transient alteration of awareness: Secondary | ICD-10-CM | POA: Diagnosis not present

## 2024-06-06 DIAGNOSIS — R55 Syncope and collapse: Secondary | ICD-10-CM | POA: Diagnosis not present

## 2024-06-06 DIAGNOSIS — R42 Dizziness and giddiness: Secondary | ICD-10-CM | POA: Diagnosis not present

## 2024-06-06 DIAGNOSIS — R079 Chest pain, unspecified: Secondary | ICD-10-CM | POA: Diagnosis not present

## 2024-06-06 DIAGNOSIS — Z79899 Other long term (current) drug therapy: Secondary | ICD-10-CM | POA: Diagnosis not present

## 2024-06-06 LAB — COMPREHENSIVE METABOLIC PANEL WITH GFR
ALT: 26 U/L (ref 0–44)
AST: 41 U/L (ref 15–41)
Albumin: 4.1 g/dL (ref 3.5–5.0)
Alkaline Phosphatase: 69 U/L (ref 38–126)
Anion gap: 19 — ABNORMAL HIGH (ref 5–15)
BUN: 13 mg/dL (ref 6–20)
CO2: 17 mmol/L — ABNORMAL LOW (ref 22–32)
Calcium: 9 mg/dL (ref 8.9–10.3)
Chloride: 102 mmol/L (ref 98–111)
Creatinine, Ser: 0.89 mg/dL (ref 0.44–1.00)
GFR, Estimated: >60 mL/min (ref >60)
Glucose, Bld: 105 mg/dL — ABNORMAL HIGH (ref 70–99)
Potassium: 3.4 mmol/L — ABNORMAL LOW (ref 3.5–5.1)
Sodium: 138 mmol/L (ref 135–145)
Total Bilirubin: 1 mg/dL (ref 0.0–1.2)
Total Protein: 7.4 g/dL (ref 6.5–8.1)

## 2024-06-06 LAB — CBC WITH DIFFERENTIAL/PLATELET
Abs Immature Granulocytes: 0.05 K/uL (ref 0.00–0.07)
Basophils Absolute: 0.1 K/uL (ref 0.0–0.1)
Basophils Relative: 1 %
Eosinophils Absolute: 0.2 K/uL (ref 0.0–0.5)
Eosinophils Relative: 1 %
HCT: 45.3 % (ref 36.0–46.0)
Hemoglobin: 14.5 g/dL (ref 12.0–15.0)
Immature Granulocytes: 0 %
Lymphocytes Relative: 22 %
Lymphs Abs: 3 K/uL (ref 0.7–4.0)
MCH: 27.8 pg (ref 26.0–34.0)
MCHC: 32 g/dL (ref 30.0–36.0)
MCV: 86.8 fL (ref 80.0–100.0)
Monocytes Absolute: 0.8 K/uL (ref 0.1–1.0)
Monocytes Relative: 6 %
Neutro Abs: 9.3 K/uL — ABNORMAL HIGH (ref 1.7–7.7)
Neutrophils Relative %: 70 %
Platelets: 328 K/uL (ref 150–400)
RBC: 5.22 MIL/uL — ABNORMAL HIGH (ref 3.87–5.11)
RDW: 14.4 % (ref 11.5–15.5)
WBC: 13.4 K/uL — ABNORMAL HIGH (ref 4.0–10.5)
nRBC: 0 % (ref 0.0–0.2)

## 2024-06-06 LAB — URINALYSIS, ROUTINE W REFLEX MICROSCOPIC
Bilirubin Urine: NEGATIVE
Glucose, UA: NEGATIVE mg/dL
Hgb urine dipstick: NEGATIVE
Ketones, ur: NEGATIVE mg/dL
Leukocytes,Ua: NEGATIVE
Nitrite: NEGATIVE
Protein, ur: NEGATIVE mg/dL
Specific Gravity, Urine: 1.001 — ABNORMAL LOW (ref 1.005–1.030)
pH: 6 (ref 5.0–8.0)

## 2024-06-06 LAB — RAPID URINE DRUG SCREEN, HOSP PERFORMED
Amphetamines: NOT DETECTED
Barbiturates: NOT DETECTED
Benzodiazepines: POSITIVE — AB
Cocaine: NOT DETECTED
Opiates: NOT DETECTED
Tetrahydrocannabinol: NOT DETECTED

## 2024-06-06 LAB — MAGNESIUM: Magnesium: 1.9 mg/dL (ref 1.7–2.4)

## 2024-06-06 LAB — I-STAT CG4 LACTIC ACID, ED
Lactic Acid, Venous: 5.2 mmol/L (ref 0.5–1.9)
Lactic Acid, Venous: 3 mmol/L (ref 0.5–1.9)

## 2024-06-06 LAB — CK: Total CK: 95 U/L (ref 38–234)

## 2024-06-06 MED ORDER — LORAZEPAM 2 MG/ML IJ SOLN
1.0000 mg | Freq: Once | INTRAMUSCULAR | Status: AC
  Administered 2024-06-06: 1 mg via INTRAVENOUS
  Filled 2024-06-06: qty 1

## 2024-06-06 MED ORDER — LACTATED RINGERS IV BOLUS
1000.0000 mL | Freq: Once | INTRAVENOUS | Status: AC
  Administered 2024-06-06: 1000 mL via INTRAVENOUS

## 2024-06-06 NOTE — Discharge Instructions (Addendum)
 Thank you for coming to [facility] Emergency Department. You were seen for lightheadedness, tunnel vision, and muscle spasms. We did an exam, labs, and imaging, and these showed possible dehydration. Please stay well hydrated at home.   Please follow up with your primary care provider within 1 week.   Do not hesitate to return to the ED or call 911 if you experience: -Worsening symptoms -Lightheadedness, passing out -Fevers/chills -Anything else that concerns you

## 2024-06-06 NOTE — ED Triage Notes (Addendum)
 Pt came in for a flare up for her disease while driving. Pt's body is locked up and has tunnel vision. Pt is really anxious as well and stated she's been in the locked position for two and a half hours. She has heavy work of breathing and sweating.    Hx addison disease
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
## 2024-06-07 DIAGNOSIS — E559 Vitamin D deficiency, unspecified: Secondary | ICD-10-CM | POA: Diagnosis not present

## 2024-06-07 DIAGNOSIS — E2740 Unspecified adrenocortical insufficiency: Secondary | ICD-10-CM | POA: Diagnosis not present

## 2024-06-07 DIAGNOSIS — M810 Age-related osteoporosis without current pathological fracture: Secondary | ICD-10-CM | POA: Diagnosis not present

## 2024-06-07 DIAGNOSIS — M329 Systemic lupus erythematosus, unspecified: Secondary | ICD-10-CM | POA: Diagnosis not present

## 2024-06-08 ENCOUNTER — Other Ambulatory Visit: Payer: Self-pay

## 2024-06-08 ENCOUNTER — Ambulatory Visit (HOSPITAL_BASED_OUTPATIENT_CLINIC_OR_DEPARTMENT_OTHER): Admitting: Psychiatry

## 2024-06-08 ENCOUNTER — Encounter (HOSPITAL_COMMUNITY): Payer: Self-pay | Admitting: Psychiatry

## 2024-06-08 VITALS — BP 116/55 | HR 80 | Ht 68.0 in | Wt 171.0 lb

## 2024-06-08 DIAGNOSIS — F4001 Agoraphobia with panic disorder: Secondary | ICD-10-CM | POA: Diagnosis not present

## 2024-06-08 MED ORDER — ALPRAZOLAM 1 MG PO TABS: ORAL_TABLET | ORAL | 4 refills | Status: DC
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
## 2024-06-11 ENCOUNTER — Other Ambulatory Visit: Payer: Self-pay | Admitting: Cardiology

## 2024-06-11 DIAGNOSIS — R002 Palpitations: Secondary | ICD-10-CM

## 2024-06-15 ENCOUNTER — Ambulatory Visit (HOSPITAL_COMMUNITY): Admitting: Psychiatry

## 2024-06-20 DIAGNOSIS — G4709 Other insomnia: Secondary | ICD-10-CM | POA: Diagnosis not present

## 2024-06-20 DIAGNOSIS — E2740 Unspecified adrenocortical insufficiency: Secondary | ICD-10-CM | POA: Diagnosis not present

## 2024-06-20 DIAGNOSIS — Z8673 Personal history of transient ischemic attack (TIA), and cerebral infarction without residual deficits: Secondary | ICD-10-CM | POA: Diagnosis not present

## 2024-06-20 DIAGNOSIS — R5383 Other fatigue: Secondary | ICD-10-CM | POA: Diagnosis not present

## 2024-06-28 ENCOUNTER — Ambulatory Visit (HOSPITAL_BASED_OUTPATIENT_CLINIC_OR_DEPARTMENT_OTHER): Admitting: Psychiatry

## 2024-06-28 ENCOUNTER — Encounter (HOSPITAL_COMMUNITY): Payer: Self-pay | Admitting: Psychiatry

## 2024-06-28 ENCOUNTER — Other Ambulatory Visit: Payer: Self-pay

## 2024-06-28 VITALS — BP 133/86 | HR 71 | Ht 68.0 in | Wt 172.0 lb

## 2024-06-28 DIAGNOSIS — F410 Panic disorder [episodic paroxysmal anxiety] without agoraphobia: Secondary | ICD-10-CM | POA: Diagnosis not present

## 2024-06-28 MED ORDER — SERTRALINE HCL 50 MG PO TABS
ORAL_TABLET | ORAL | 3 refills | Status: DC
Start: 2024-06-28 — End: 2024-08-17

## 2024-06-28 MED ORDER — ZOLPIDEM TARTRATE 10 MG PO TABS: 10.0000 mg | ORAL_TABLET | Freq: Every evening | ORAL | 3 refills | Status: DC | PRN

## 2024-06-28 NOTE — Progress Notes (Signed)
 Past Psychiatric Initial Adult Assessment   Patient Identification: Lauren Chung MRN:  [id number] Date of Evaluation:  12/29/2023 Referral Source [facility] Kirks Chief Complaint:    Visit Diagnosis:    Since her last visit she had another attack.  This occurred while she was driving.  It lasted 5 to 10 minutes.  This characterized by starting all by shortness of breath and intense anxiety.  There clearly was no precipitant.  It lasted 5 to 10 minutes and then resolved with any without any postictal problem.  It is noted this patient has a history of pulmonary embolus and is on a blood thinner.  Patient is being treated for Addison's disease.  The patient denies being depressed.  She describes some anxiety but a lot of it is related to anticipatory anxiety and having another attack.  I think this is consistent with a panic disorder.  When she has recent attack she took a number of medications at the same time.  It included Xanax  1 mg which I think helped the most.  She also took some extra prednisone  and some metoprolol  in the medicine for dizziness.  I do not think any that his medicines were significant other than the Xanax .  When the patient has a typical attack that she attributes to Addison's disease it is associated with significant GI symptoms like vomiting and diarrhea.  Noted that occur.  Today I think the patient has a clear panic disorder. Virtual Visit via Telephone Note  I connected with Latanga Weems on 12/29/23 at  4:30 PM EST by telephone and verified that I am speaking with the correct person using two identifiers.  Location: Patient: home Provider: office   I discussed the limitations, risks, security and privacy concerns of performing an evaluation and management service by telephone and the availability of in person appointments. I also discussed with the patient that there may be a patient responsible charge related to this service. The patient expressed understanding and  agreed to proceed.      I discussed the assessment and treatment plan with the patient. The patient was provided an opportunity to ask questions and all were answered. The patient agreed with the plan and demonstrated an understanding of the instructions.   The patient was advised to call back or seek an in-person evaluation if the symptoms worsen or if the condition fails to improve as anticipated.  I provided 25 minutes of non-face-to-face time during this encounter.   [name], MD    Anxiety Symptoms:   Psychotic Symptoms:   PTSD Symptoms:   Past Psychiatric History: Past therapy, one psychiatric hospitalization  Previous Psychotropic Medications: Yes   Substance Abuse History in the last 12 months:  No.  Consequences of Substance Abuse: Negative  Past Medical History:  Past Medical History:  Diagnosis Date   Anemia    Anxiety    Arthritis    CKD (chronic kidney disease) stage 3, GFR 30-59 ml/min (HCC)    Depression    History of blood transfusion    in [place]   Hyperlipidemia    Hypertension    Lupus    PE (pulmonary embolism)    Pneumonia    x 3 - last time 2017   PTSD (post-traumatic stress disorder)    Renal disorder    Rheumatoid arthritis (HCC)    Seizures (HCC)    Stroke (HCC)    many years ago per patient, no deficits   SVD (spontaneous vaginal delivery)  x 3   TIA (transient ischemic attack)    many years ago per patient, no deficits    Past Surgical History:  Procedure Laterality Date   ABDOMINAL SURGERY     due internal bleeding   ABLATION     gyn procedure for bleeding   CYST EXCISION     top of head   EXCISION MASS HEAD N/A 08/05/2019   Procedure: EXCISION OF SCALP CYST;  Surgeon: Jesus Oliphant, MD;  Location: [facility];  Service: ENT;  Laterality: N/A;    Family Psychiatric History:   Family History:  Family History  Problem Relation Age of Onset   Drug abuse Mother    Alcohol abuse Mother    Leukemia  Mother    Leukemia Father    Seizures Daughter     Social History:   Social History   Socioeconomic History   Marital status: Single    Spouse name: Not on file   Number of children: 3   Years of education: Not on file   Highest education level: Not on file  Occupational History   Not on file  Tobacco Use   Smoking status: Never   Smokeless tobacco: Never  Vaping Use   Vaping status: Never Used  Substance and Sexual Activity   Alcohol use: Never   Drug use: Never   Sexual activity: Not Currently    Partners: Male    Birth control/protection: Condom, Post-menopausal  Other Topics Concern   Not on file  Social History Narrative   R handed   Live with son and 3 dogs   Two story   No Caffeine   Social Drivers of Corporate investment banker Strain: Not on file  Food Insecurity: Food Insecurity Present (12/22/2023)   Hunger Vital Sign    Worried About Running Out of Food in the Last Year: Sometimes true    Ran Out of Food in the Last Year: Sometimes true  Transportation Needs: No Transportation Needs (12/22/2023)   PRAPARE - Administrator, Civil Service (Medical): No    Lack of Transportation (Non-Medical): No  Physical Activity: Not on file  Stress: Not on file  Social Connections: Socially Isolated (12/22/2023)   Social Connection and Isolation Panel [NHANES]    Frequency of Communication with Friends and Family: Once a week    Frequency of Social Gatherings with Friends and Family: Once a week    Attends Religious Services: Never    Database administrator or Organizations: No    Attends Banker Meetings: Never    Marital Status: Divorced    Additional Social History:   Allergies:   Allergies  Allergen Reactions   Acetaminophen  Nausea And Vomiting   Belsomra  [Suvorexant ] Shortness Of Breath and Rash   Codeine Shortness Of Breath   Iodinated Contrast Media Shortness Of Breath    Metabolic Disorder Labs: Lab Results  Component  Value Date   HGBA1C 4.8 12/22/2023   MPG 91.06 12/22/2023   MPG 96.8 03/05/2022   No results found for: PROLACTIN Lab Results  Component Value Date   CHOL 127 12/22/2023   TRIG 90 12/22/2023   HDL 41 12/22/2023   CHOLHDL 3.1 12/22/2023   VLDL 18 12/22/2023   LDLCALC 68 12/22/2023   LDLCALC 52 03/06/2022     Current Medications: Current Outpatient Medications  Medication Sig Dispense Refill   alendronate (FOSAMAX) 70 MG tablet Take 70 mg by mouth once a week.  ALPRAZolam  (XANAX ) 1 MG tablet Take 1 tablet (1 mg total) by mouth 2 (two) times daily. 90 tablet 3   docusate sodium  (COLACE) 100 MG capsule Take 1 capsule (100 mg total) by mouth 2 (two) times daily. (Patient not taking: Reported on 12/22/2023) 10 capsule 0   hydroxychloroquine  (PLAQUENIL ) 200 MG tablet Take 200 mg by mouth daily.     meclizine  (ANTIVERT ) 25 MG tablet Take 1 tablet (25 mg total) by mouth 3 (three) times daily as needed for dizziness. 30 tablet 0   predniSONE  (DELTASONE ) 10 MG tablet Prednisone  40 mg po daily x 2 day then Prednisone  30 mg po daily x 2 day then Prednisone  20 mg po daily x 2 day then Prednisone  10 mg daily x 2 day then start taking prednisone  5 mg daily till you  see your rheumatologist (Patient taking differently: Take 5-10 mg by mouth See admin instructions. Prednisone  40 mg po daily x 2 day then Prednisone  30 mg po daily x 2 day then Prednisone  20 mg po daily x 2 day then Prednisone  10 mg daily x 2 day then start taking prednisone  5 mg daily till you  see your rheumatologist) 25 tablet 0   predniSONE  (DELTASONE ) 5 MG tablet Take 1 tablet (5 mg total) by mouth daily with breakfast. (Patient not taking: Reported on 12/22/2023) 30 tablet 3   rosuvastatin  (CRESTOR ) 10 MG tablet Take 10 mg by mouth in the morning.     warfarin (COUMADIN ) 4 MG tablet Take 4 mg by mouth daily.     zolpidem  (AMBIEN ) 10 MG tablet Take 1 tablet (10 mg total) by mouth at bedtime as needed for sleep. 30 tablet 3   No  current facility-administered medications for this visit.    Neurologic: Headache: No Seizure: No Paresthesias:No  Musculoskeletal: Strength & Muscle Tone: within normal limits Gait & Station: normal Patient leans: N/A  Psychiatric Specialty Exam: ROS  Blood pressure 119/68, pulse 73, resp. rate 18, height 5' 8 (1.727 m), weight 156 lb 12.8 oz (71.1 kg).Body mass index is 23.84 kg/m.  General Appearance: Casual  Eye Contact:  Good  Speech:  Clear and Coherent  Volume:  Normal  Mood:  Dysphoric  Affect:  Appropriate  Thought Process:  Goal Directed  Orientation:  NA  Thought Content:  Logical  Suicidal Thoughts:  No  Homicidal Thoughts:  No  Memory:  NA  Judgement:  Good  Insight:  Good  Psychomotor Activity:  Normal  Concentration:    Recall:  Fair  Fund of Knowledge:Fair  Language: Good  Akathisia:  No  Handed:  Right  AIMS (if indicated):    Assets:  Desire for Improvement  ADL's:  Intact  Cognition: WNL  Sleep:      Treatment Plan Summary: 2/25/20254:52 PM    This patient's diagnosis is panic disorder.  She will continue taking Xanax  1 mg 3 times daily.  I will make an opportunity for her to take an extra Xanax  if in fact she has an attack.  This seems to be rare.  I suspect that what happened once or twice a month.  Importantly now is we will begin her on Zoloft  50 mg for her panic disorder.  Her second problem is major depression.  She will continue taking desipramine  as prescribed.  This patient was seen again in 5 to 6 weeks.

## 2024-07-06 ENCOUNTER — Other Ambulatory Visit: Payer: Self-pay | Admitting: Neurology

## 2024-07-07 DIAGNOSIS — E2740 Unspecified adrenocortical insufficiency: Secondary | ICD-10-CM | POA: Diagnosis not present

## 2024-07-07 DIAGNOSIS — M1990 Unspecified osteoarthritis, unspecified site: Secondary | ICD-10-CM | POA: Diagnosis not present

## 2024-07-07 DIAGNOSIS — Z8739 Personal history of other diseases of the musculoskeletal system and connective tissue: Secondary | ICD-10-CM | POA: Diagnosis not present

## 2024-07-07 DIAGNOSIS — Z79899 Other long term (current) drug therapy: Secondary | ICD-10-CM | POA: Diagnosis not present

## 2024-07-07 DIAGNOSIS — M797 Fibromyalgia: Secondary | ICD-10-CM | POA: Diagnosis not present

## 2024-07-07 DIAGNOSIS — Z86718 Personal history of other venous thrombosis and embolism: Secondary | ICD-10-CM | POA: Diagnosis not present

## 2024-07-07 DIAGNOSIS — N1831 Chronic kidney disease, stage 3a: Secondary | ICD-10-CM | POA: Diagnosis not present

## 2024-07-07 DIAGNOSIS — M0609 Rheumatoid arthritis without rheumatoid factor, multiple sites: Secondary | ICD-10-CM | POA: Diagnosis not present

## 2024-07-07 DIAGNOSIS — M810 Age-related osteoporosis without current pathological fracture: Secondary | ICD-10-CM | POA: Diagnosis not present

## 2024-07-08 DIAGNOSIS — Z7901 Long term (current) use of anticoagulants: Secondary | ICD-10-CM | POA: Diagnosis not present

## 2024-07-08 DIAGNOSIS — D473 Essential (hemorrhagic) thrombocythemia: Secondary | ICD-10-CM | POA: Diagnosis not present

## 2024-07-09 ENCOUNTER — Other Ambulatory Visit: Payer: Self-pay | Admitting: Cardiology

## 2024-07-09 DIAGNOSIS — R002 Palpitations: Secondary | ICD-10-CM

## 2024-07-19 DIAGNOSIS — M810 Age-related osteoporosis without current pathological fracture: Secondary | ICD-10-CM | POA: Diagnosis not present

## 2024-07-19 DIAGNOSIS — E559 Vitamin D deficiency, unspecified: Secondary | ICD-10-CM | POA: Diagnosis not present

## 2024-07-19 DIAGNOSIS — E2740 Unspecified adrenocortical insufficiency: Secondary | ICD-10-CM | POA: Diagnosis not present

## 2024-07-22 ENCOUNTER — Other Ambulatory Visit (HOSPITAL_COMMUNITY): Payer: Self-pay | Admitting: Psychiatry

## 2024-07-22 DIAGNOSIS — Z7901 Long term (current) use of anticoagulants: Secondary | ICD-10-CM | POA: Diagnosis not present

## 2024-07-24 ENCOUNTER — Other Ambulatory Visit: Payer: Self-pay | Admitting: Cardiology

## 2024-07-24 DIAGNOSIS — R002 Palpitations: Secondary | ICD-10-CM

## 2024-08-06 ENCOUNTER — Other Ambulatory Visit: Payer: Self-pay | Admitting: Cardiology

## 2024-08-06 DIAGNOSIS — R002 Palpitations: Secondary | ICD-10-CM

## 2024-08-17 ENCOUNTER — Ambulatory Visit (HOSPITAL_COMMUNITY): Admitting: Psychiatry

## 2024-08-17 ENCOUNTER — Other Ambulatory Visit: Payer: Self-pay

## 2024-08-17 VITALS — BP 151/93 | HR 81 | Ht 68.0 in | Wt 180.0 lb

## 2024-08-17 DIAGNOSIS — F4001 Agoraphobia with panic disorder: Secondary | ICD-10-CM

## 2024-08-17 MED ORDER — FLUOXETINE HCL 20 MG PO CAPS: 20.0000 mg | ORAL_CAPSULE | Freq: Every day | ORAL | 2 refills | Status: DC

## 2024-08-17 NOTE — Progress Notes (Signed)
 Past Psychiatric Initial Adult Assessment   Patient Identification: Lauren Chung MRN:  [id number] Date of Evaluation:  12/29/2023 Referral Source Powell Kirks Chief Complaint:    Visit Diagnosis:    Since her last visit she had an   Today the patient is not doing all that well.  She has had small panic attacks but multiple ones.  Her last major attack she did not have any GI symptoms so was pretty classic that it was a panic episode.  She has had further problems and that she is becoming withdrawn and frightened to go out for fear she is going to have an attack.  She is demonstrating some agoraphobia.  She takes 3 Xanax 's a day when she is having attack she will take an extra 1 mg which does help but not immediately.  The patient says since being on the Zoloft  she feels jittery.  The patient contacted recurs for her shortness of breath then she has an increased respiratory rate which makes her dizzy.  The patient is trying to get out to keep her medical appointments. Virtual Visit via Telephone Note  I connected with Lauren Chung on 12/29/23 at  4:30 PM EST by telephone and verified that I am speaking with the correct person using two identifiers.  Location: Patient: home Provider: office   I discussed the limitations, risks, security and privacy concerns of performing an evaluation and management service by telephone and the availability of in person appointments. I also discussed with the patient that there may be a patient responsible charge related to this service. The patient expressed understanding and agreed to proceed.      I discussed the assessment and treatment plan with the patient. The patient was provided an opportunity to ask questions and all were answered. The patient agreed with the plan and demonstrated an understanding of the instructions.   The patient was advised to call back or seek an in-person evaluation if the symptoms worsen or if the condition fails to  improve as anticipated.  I provided 25 minutes of non-face-to-face time during this encounter.   [name], MD    Anxiety Symptoms:   Psychotic Symptoms:   PTSD Symptoms:   Past Psychiatric History: Past therapy, one psychiatric hospitalization  Previous Psychotropic Medications: Yes   Substance Abuse History in the last 12 months:  No.  Consequences of Substance Abuse: Negative  Past Medical History:  Past Medical History:  Diagnosis Date   Anemia    Anxiety    Arthritis    CKD (chronic kidney disease) stage 3, GFR 30-59 ml/min (HCC)    Depression    History of blood transfusion    in [place]   Hyperlipidemia    Hypertension    Lupus    PE (pulmonary embolism)    Pneumonia    x 3 - last time 2017   PTSD (post-traumatic stress disorder)    Renal disorder    Rheumatoid arthritis (HCC)    Seizures (HCC)    Stroke (HCC)    many years ago per patient, no deficits   SVD (spontaneous vaginal delivery)    x 3   TIA (transient ischemic attack)    many years ago per patient, no deficits    Past Surgical History:  Procedure Laterality Date   ABDOMINAL SURGERY     due internal bleeding   ABLATION     gyn procedure for bleeding   CYST EXCISION     top of head  EXCISION MASS HEAD N/A 08/05/2019   Procedure: EXCISION OF SCALP CYST;  Surgeon: Jesus Oliphant, MD;  Location: [facility];  Service: ENT;  Laterality: N/A;    Family Psychiatric History:   Family History:  Family History  Problem Relation Age of Onset   Drug abuse Mother    Alcohol abuse Mother    Leukemia Mother    Leukemia Father    Seizures Daughter     Social History:   Social History   Socioeconomic History   Marital status: Single    Spouse name: Not on file   Number of children: 3   Years of education: Not on file   Highest education level: Not on file  Occupational History   Not on file  Tobacco Use   Smoking status: Never   Smokeless tobacco: Never  Vaping Use    Vaping status: Never Used  Substance and Sexual Activity   Alcohol use: Never   Drug use: Never   Sexual activity: Not Currently    Partners: Male    Birth control/protection: Condom, Post-menopausal  Other Topics Concern   Not on file  Social History Narrative   R handed   Live with son and 3 dogs   Two story   No Caffeine   Social Drivers of Corporate investment banker Strain: Not on file  Food Insecurity: Food Insecurity Present (12/22/2023)   Hunger Vital Sign    Worried About Running Out of Food in the Last Year: Sometimes true    Ran Out of Food in the Last Year: Sometimes true  Transportation Needs: No Transportation Needs (12/22/2023)   PRAPARE - Administrator, Civil Service (Medical): No    Lack of Transportation (Non-Medical): No  Physical Activity: Not on file  Stress: Not on file  Social Connections: Socially Isolated (12/22/2023)   Social Connection and Isolation Panel [NHANES]    Frequency of Communication with Friends and Family: Once a week    Frequency of Social Gatherings with Friends and Family: Once a week    Attends Religious Services: Never    Database administrator or Organizations: No    Attends Banker Meetings: Never    Marital Status: Divorced    Additional Social History:   Allergies:   Allergies  Allergen Reactions   Acetaminophen  Nausea And Vomiting   Belsomra  [Suvorexant ] Shortness Of Breath and Rash   Codeine Shortness Of Breath   Iodinated Contrast Media Shortness Of Breath    Metabolic Disorder Labs: Lab Results  Component Value Date   HGBA1C 4.8 12/22/2023   MPG 91.06 12/22/2023   MPG 96.8 03/05/2022   No results found for: PROLACTIN Lab Results  Component Value Date   CHOL 127 12/22/2023   TRIG 90 12/22/2023   HDL 41 12/22/2023   CHOLHDL 3.1 12/22/2023   VLDL 18 12/22/2023   LDLCALC 68 12/22/2023   LDLCALC 52 03/06/2022     Current Medications: Current Outpatient Medications   Medication Sig Dispense Refill   alendronate (FOSAMAX) 70 MG tablet Take 70 mg by mouth once a week.     ALPRAZolam  (XANAX ) 1 MG tablet Take 1 tablet (1 mg total) by mouth 2 (two) times daily. 90 tablet 3   docusate sodium  (COLACE) 100 MG capsule Take 1 capsule (100 mg total) by mouth 2 (two) times daily. (Patient not taking: Reported on 12/22/2023) 10 capsule 0   hydroxychloroquine  (PLAQUENIL ) 200 MG tablet Take 200 mg by mouth  daily.     meclizine  (ANTIVERT ) 25 MG tablet Take 1 tablet (25 mg total) by mouth 3 (three) times daily as needed for dizziness. 30 tablet 0   predniSONE  (DELTASONE ) 10 MG tablet Prednisone  40 mg po daily x 2 day then Prednisone  30 mg po daily x 2 day then Prednisone  20 mg po daily x 2 day then Prednisone  10 mg daily x 2 day then start taking prednisone  5 mg daily till you  see your rheumatologist (Patient taking differently: Take 5-10 mg by mouth See admin instructions. Prednisone  40 mg po daily x 2 day then Prednisone  30 mg po daily x 2 day then Prednisone  20 mg po daily x 2 day then Prednisone  10 mg daily x 2 day then start taking prednisone  5 mg daily till you  see your rheumatologist) 25 tablet 0   predniSONE  (DELTASONE ) 5 MG tablet Take 1 tablet (5 mg total) by mouth daily with breakfast. (Patient not taking: Reported on 12/22/2023) 30 tablet 3   rosuvastatin  (CRESTOR ) 10 MG tablet Take 10 mg by mouth in the morning.     warfarin (COUMADIN ) 4 MG tablet Take 4 mg by mouth daily.     zolpidem  (AMBIEN ) 10 MG tablet Take 1 tablet (10 mg total) by mouth at bedtime as needed for sleep. 30 tablet 3   No current facility-administered medications for this visit.    Neurologic: Headache: No Seizure: No Paresthesias:No  Musculoskeletal: Strength & Muscle Tone: within normal limits Gait & Station: normal Patient leans: N/A  Psychiatric Specialty Exam: ROS  Blood pressure 119/68, pulse 73, resp. rate 18, height 5' 8 (1.727 m), weight 156 lb 12.8 oz (71.1 kg).Body mass  index is 23.84 kg/m.  General Appearance: Casual  Eye Contact:  Good  Speech:  Clear and Coherent  Volume:  Normal  Mood:  Dysphoric  Affect:  Appropriate  Thought Process:  Goal Directed  Orientation:  NA  Thought Content:  Logical  Suicidal Thoughts:  No  Homicidal Thoughts:  No  Memory:  NA  Judgement:  Good  Insight:  Good  Psychomotor Activity:  Normal  Concentration:    Recall:  Fair  Fund of Knowledge:Fair  Language: Good  Akathisia:  No  Handed:  Right  AIMS (if indicated):    Assets:  Desire for Improvement  ADL's:  Intact  Cognition: WNL  Sleep:      Treatment Plan Summary: 2/25/20254:52 PM    To clarify I think this patient most likely has a panic disorder.  At this time she will discontinue the Zoloft  and she will begin on Prozac 20 mg.  Note is the patient does not take desipramine  at this time.  She continues taking Xanax  1 mg 3 times daily with 1 as needed.  She will return to see me in approximately 7 weeks.  We chose not to go back to Remeron  which is not unreasonable except that I do not think it has a big effect of panic disorder and the patient is already gaining weight from her prednisone .  I think should be noted when she took Remeron  in the past she never really gain weight from it.  Therefore it is not unreasonable at some point if necessary to go back to it.

## 2024-08-19 DIAGNOSIS — Z86711 Personal history of pulmonary embolism: Secondary | ICD-10-CM | POA: Diagnosis not present

## 2024-08-19 DIAGNOSIS — Z7901 Long term (current) use of anticoagulants: Secondary | ICD-10-CM | POA: Diagnosis not present

## 2024-09-10 ENCOUNTER — Other Ambulatory Visit (HOSPITAL_COMMUNITY): Payer: Self-pay | Admitting: Psychiatry

## 2024-09-14 ENCOUNTER — Other Ambulatory Visit: Payer: Self-pay | Admitting: Cardiology

## 2024-09-14 DIAGNOSIS — R002 Palpitations: Secondary | ICD-10-CM

## 2024-09-20 ENCOUNTER — Telehealth: Payer: Self-pay | Admitting: Cardiology

## 2024-09-20 NOTE — Telephone Encounter (Signed)
 Requested last office note   from [facility] has been routed to [place]-  last office note is from 04/20/23

## 2024-09-20 NOTE — Telephone Encounter (Signed)
 [facility] called requesting last office note please advise

## 2024-10-19 ENCOUNTER — Other Ambulatory Visit: Payer: Self-pay

## 2024-10-19 ENCOUNTER — Ambulatory Visit (HOSPITAL_BASED_OUTPATIENT_CLINIC_OR_DEPARTMENT_OTHER): Admitting: Psychiatry

## 2024-10-19 VITALS — BP 123/80 | HR 86 | Ht 68.0 in | Wt 189.0 lb

## 2024-10-19 DIAGNOSIS — F4001 Agoraphobia with panic disorder: Secondary | ICD-10-CM | POA: Diagnosis not present

## 2024-10-19 MED ORDER — FLUOXETINE HCL 20 MG PO CAPS: 20.0000 mg | ORAL_CAPSULE | Freq: Every day | ORAL | 5 refills | Status: AC

## 2024-10-19 MED ORDER — ZOLPIDEM TARTRATE 10 MG PO TABS: 10.0000 mg | ORAL_TABLET | Freq: Every evening | ORAL | 3 refills | Status: AC | PRN

## 2024-10-19 MED ORDER — ALPRAZOLAM 1 MG PO TABS: ORAL_TABLET | ORAL | 4 refills | Status: AC

## 2024-10-19 NOTE — Progress Notes (Signed)
 Past Psychiatric Initial Adult Assessment   Patient Identification: Lauren Chung MRN:  [id number] Date of Evaluation:  12/29/2023 Referral Source [facility] Chief Complaint:    Visit Diagnosis:    Since her last visit she had an   Today the patient is doing much better.  She has had no panic attacks.  Her agoraphobia is minimal.  She feels great.  The switch to Prozac  has definitely been beneficial.  She is very pleased.  No even small panic attacks.  She denies daily depression.  She is sleeping and eating well.  She is looking forward to the holidays.  Financially she is stable.  She owns her own home and likes it.  Unfortunately for her Addison's disease she has to stay on a relatively high dose of prednisone  20 mg.  The patient does not work.  Her family seems to be very stable.  The patient drinks no alcohol and uses no drugs and is functioning very well. Virtual Visit via Telephone Note  I connected with Lauren Chung on 12/29/23 at  4:30 PM EST by telephone and verified that I am speaking with the correct person using two identifiers.  Location: Patient: home Provider: office   I discussed the limitations, risks, security and privacy concerns of performing an evaluation and management service by telephone and the availability of in person appointments. I also discussed with the patient that there may be a patient responsible charge related to this service. The patient expressed understanding and agreed to proceed.      I discussed the assessment and treatment plan with the patient. The patient was provided an opportunity to ask questions and all were answered. The patient agreed with the plan and demonstrated an understanding of the instructions.   The patient was advised to call back or seek an in-person evaluation if the symptoms worsen or if the condition fails to improve as anticipated.  I provided 25 minutes of non-face-to-face time during this  encounter.   [name], MD    Anxiety Symptoms:   Psychotic Symptoms:   PTSD Symptoms:   Past Psychiatric History: Past therapy, one psychiatric hospitalization  Previous Psychotropic Medications: Yes   Substance Abuse History in the last 12 months:  No.  Consequences of Substance Abuse: Negative  Past Medical History:  Past Medical History:  Diagnosis Date   Anemia    Anxiety    Arthritis    CKD (chronic kidney disease) stage 3, GFR 30-59 ml/min (HCC)    Depression    History of blood transfusion    in [place]   Hyperlipidemia    Hypertension    Lupus    PE (pulmonary embolism)    Pneumonia    x 3 - last time 2017   PTSD (post-traumatic stress disorder)    Renal disorder    Rheumatoid arthritis (HCC)    Seizures (HCC)    Stroke (HCC)    many years ago per patient, no deficits   SVD (spontaneous vaginal delivery)    x 3   TIA (transient ischemic attack)    many years ago per patient, no deficits    Past Surgical History:  Procedure Laterality Date   ABDOMINAL SURGERY     due internal bleeding   ABLATION     gyn procedure for bleeding   CYST EXCISION     top of head   EXCISION MASS HEAD N/A 08/05/2019   Procedure: EXCISION OF SCALP CYST;  Surgeon: Jesus Oliphant, MD;  Location:  [facility];  Service: ENT;  Laterality: N/A;    Family Psychiatric History:   Family History:  Family History  Problem Relation Age of Onset   Drug abuse Mother    Alcohol abuse Mother    Leukemia Mother    Leukemia Father    Seizures Daughter     Social History:   Social History   Socioeconomic History   Marital status: Single    Spouse name: Not on file   Number of children: 3   Years of education: Not on file   Highest education level: Not on file  Occupational History   Not on file  Tobacco Use   Smoking status: Never   Smokeless tobacco: Never  Vaping Use   Vaping status: Never Used  Substance and Sexual Activity   Alcohol use: Never    Drug use: Never   Sexual activity: Not Currently    Partners: Male    Birth control/protection: Condom, Post-menopausal  Other Topics Concern   Not on file  Social History Narrative   R handed   Live with son and 3 dogs   Two story   No Caffeine   Social Drivers of Corporate Investment Banker Strain: Not on file  Food Insecurity: Food Insecurity Present (12/22/2023)   Hunger Vital Sign    Worried About Running Out of Food in the Last Year: Sometimes true    Ran Out of Food in the Last Year: Sometimes true  Transportation Needs: No Transportation Needs (12/22/2023)   PRAPARE - Administrator, Civil Service (Medical): No    Lack of Transportation (Non-Medical): No  Physical Activity: Not on file  Stress: Not on file  Social Connections: Socially Isolated (12/22/2023)   Social Connection and Isolation Panel [NHANES]    Frequency of Communication with Friends and Family: Once a week    Frequency of Social Gatherings with Friends and Family: Once a week    Attends Religious Services: Never    Database Administrator or Organizations: No    Attends Banker Meetings: Never    Marital Status: Divorced    Additional Social History:   Allergies:   Allergies  Allergen Reactions   Acetaminophen  Nausea And Vomiting   Belsomra  [Suvorexant ] Shortness Of Breath and Rash   Codeine Shortness Of Breath   Iodinated Contrast Media Shortness Of Breath    Metabolic Disorder Labs: Lab Results  Component Value Date   HGBA1C 4.8 12/22/2023   MPG 91.06 12/22/2023   MPG 96.8 03/05/2022   No results found for: PROLACTIN Lab Results  Component Value Date   CHOL 127 12/22/2023   TRIG 90 12/22/2023   HDL 41 12/22/2023   CHOLHDL 3.1 12/22/2023   VLDL 18 12/22/2023   LDLCALC 68 12/22/2023   LDLCALC 52 03/06/2022     Current Medications: Current Outpatient Medications  Medication Sig Dispense Refill   alendronate (FOSAMAX) 70 MG tablet Take 70 mg by mouth once  a week.     ALPRAZolam  (XANAX ) 1 MG tablet Take 1 tablet (1 mg total) by mouth 2 (two) times daily. 90 tablet 3   docusate sodium  (COLACE) 100 MG capsule Take 1 capsule (100 mg total) by mouth 2 (two) times daily. (Patient not taking: Reported on 12/22/2023) 10 capsule 0   hydroxychloroquine  (PLAQUENIL ) 200 MG tablet Take 200 mg by mouth daily.     meclizine  (ANTIVERT ) 25 MG tablet Take 1 tablet (25 mg total) by mouth 3 (  three) times daily as needed for dizziness. 30 tablet 0   predniSONE  (DELTASONE ) 10 MG tablet Prednisone  40 mg po daily x 2 day then Prednisone  30 mg po daily x 2 day then Prednisone  20 mg po daily x 2 day then Prednisone  10 mg daily x 2 day then start taking prednisone  5 mg daily till you  see your rheumatologist (Patient taking differently: Take 5-10 mg by mouth See admin instructions. Prednisone  40 mg po daily x 2 day then Prednisone  30 mg po daily x 2 day then Prednisone  20 mg po daily x 2 day then Prednisone  10 mg daily x 2 day then start taking prednisone  5 mg daily till you  see your rheumatologist) 25 tablet 0   predniSONE  (DELTASONE ) 5 MG tablet Take 1 tablet (5 mg total) by mouth daily with breakfast. (Patient not taking: Reported on 12/22/2023) 30 tablet 3   rosuvastatin  (CRESTOR ) 10 MG tablet Take 10 mg by mouth in the morning.     warfarin (COUMADIN ) 4 MG tablet Take 4 mg by mouth daily.     zolpidem  (AMBIEN ) 10 MG tablet Take 1 tablet (10 mg total) by mouth at bedtime as needed for sleep. 30 tablet 3   No current facility-administered medications for this visit.    Neurologic: Headache: No Seizure: No Paresthesias:No  Musculoskeletal: Strength & Muscle Tone: within normal limits Gait & Station: normal Patient leans: N/A  Psychiatric Specialty Exam: ROS  Blood pressure 119/68, pulse 73, resp. rate 18, height 5' 8 (1.727 m), weight 156 lb 12.8 oz (71.1 kg).Body mass index is 23.84 kg/m.  General Appearance: Casual  Eye Contact:  Good  Speech:  Clear and  Coherent  Volume:  Normal  Mood:  Dysphoric  Affect:  Appropriate  Thought Process:  Goal Directed  Orientation:  NA  Thought Content:  Logical  Suicidal Thoughts:  No  Homicidal Thoughts:  No  Memory:  NA  Judgement:  Good  Insight:  Good  Psychomotor Activity:  Normal  Concentration:    Recall:  Fair  Fund of Knowledge:Fair  Language: Good  Akathisia:  No  Handed:  Right  AIMS (if indicated):    Assets:  Desire for Improvement  ADL's:  Intact  Cognition: WNL  Sleep:      Treatment Plan Summary: 2/25/20254:52 PM     Panic disorder with agoraphobia it is her diagnosis.  The patient is 90% improved taking Prozac  20 mg.  She will continue taking Xanax  1 mg 3 times daily.  The combination together work well.  She is not in therapy.  She will return to see me in 3 to 4 months.  I think she is very stable and had a good response to the switch to Prozac .

## 2025-02-01 ENCOUNTER — Ambulatory Visit (HOSPITAL_COMMUNITY): Admitting: Psychiatry
